# Patient Record
Sex: Female | Born: 1970 | Race: White | Hispanic: No | Marital: Single | Smoking: Never smoker
Health system: Southern US, Community
[De-identification: ages and names within clinical notes are randomized; demographics above are authoritative.]

## PROBLEM LIST (undated history)

## (undated) ENCOUNTER — Emergency Department (HOSPITAL_COMMUNITY): Admission: EM | Payer: Medicare Other | Source: Home / Self Care

## (undated) ENCOUNTER — Emergency Department (HOSPITAL_COMMUNITY): Disposition: A | Discharge status: Other Acute Inpatient Hospital | Payer: Medicare Other

## (undated) DIAGNOSIS — F4325 Adjustment disorder with mixed disturbance of emotions and conduct: Secondary | ICD-10-CM

## (undated) DIAGNOSIS — H409 Unspecified glaucoma: Secondary | ICD-10-CM

## (undated) DIAGNOSIS — I890 Lymphedema, not elsewhere classified: Secondary | ICD-10-CM

## (undated) DIAGNOSIS — R51 Headache: Secondary | ICD-10-CM

## (undated) DIAGNOSIS — I8390 Asymptomatic varicose veins of unspecified lower extremity: Secondary | ICD-10-CM

## (undated) DIAGNOSIS — G919 Hydrocephalus, unspecified: Secondary | ICD-10-CM

## (undated) DIAGNOSIS — R519 Headache, unspecified: Secondary | ICD-10-CM

## (undated) HISTORY — PX: EYE SURGERY: SHX253

## (undated) HISTORY — DX: Headache: R51

## (undated) HISTORY — DX: Lymphedema, not elsewhere classified: I89.0

## (undated) HISTORY — DX: Hydrocephalus, unspecified: G91.9

## (undated) HISTORY — DX: Unspecified glaucoma: H40.9

## (undated) HISTORY — DX: Headache, unspecified: R51.9

## (undated) HISTORY — DX: Adjustment disorder with mixed disturbance of emotions and conduct: F43.25

## (undated) HISTORY — DX: Asymptomatic varicose veins of unspecified lower extremity: I83.90

---

## 1998-05-13 ENCOUNTER — Emergency Department (HOSPITAL_COMMUNITY): Admission: EM | Admit: 1998-05-13 | Discharge: 1998-05-13 | Payer: Self-pay | Admitting: Emergency Medicine

## 1998-05-17 ENCOUNTER — Encounter: Admission: RE | Admit: 1998-05-17 | Discharge: 1998-05-17 | Payer: Self-pay | Admitting: *Deleted

## 1998-06-03 ENCOUNTER — Emergency Department (HOSPITAL_COMMUNITY): Admission: EM | Admit: 1998-06-03 | Discharge: 1998-06-03 | Payer: Self-pay | Admitting: Emergency Medicine

## 1998-10-19 HISTORY — PX: KNEE ARTHROSCOPY: SUR90

## 1998-10-31 ENCOUNTER — Ambulatory Visit (HOSPITAL_BASED_OUTPATIENT_CLINIC_OR_DEPARTMENT_OTHER): Admission: RE | Admit: 1998-10-31 | Discharge: 1998-10-31 | Payer: Self-pay | Admitting: Orthopedic Surgery

## 1999-02-28 ENCOUNTER — Ambulatory Visit (HOSPITAL_COMMUNITY): Admission: RE | Admit: 1999-02-28 | Discharge: 1999-02-28 | Payer: Self-pay | Admitting: Orthopedic Surgery

## 1999-09-04 ENCOUNTER — Encounter (INDEPENDENT_AMBULATORY_CARE_PROVIDER_SITE_OTHER): Payer: Self-pay | Admitting: Internal Medicine

## 2004-04-14 ENCOUNTER — Emergency Department (HOSPITAL_COMMUNITY): Admission: EM | Admit: 2004-04-14 | Discharge: 2004-04-14 | Payer: Self-pay | Admitting: Family Medicine

## 2004-06-17 ENCOUNTER — Encounter: Admission: RE | Admit: 2004-06-17 | Discharge: 2004-06-17 | Payer: Self-pay | Admitting: Internal Medicine

## 2004-06-30 ENCOUNTER — Encounter: Admission: RE | Admit: 2004-06-30 | Discharge: 2004-07-18 | Payer: Self-pay | Admitting: Internal Medicine

## 2004-08-13 ENCOUNTER — Ambulatory Visit: Payer: Self-pay | Admitting: Internal Medicine

## 2004-08-29 ENCOUNTER — Ambulatory Visit (HOSPITAL_COMMUNITY): Admission: RE | Admit: 2004-08-29 | Discharge: 2004-08-29 | Payer: Self-pay | Admitting: Internal Medicine

## 2005-08-05 ENCOUNTER — Ambulatory Visit: Payer: Self-pay | Admitting: Internal Medicine

## 2005-08-10 ENCOUNTER — Encounter: Admission: RE | Admit: 2005-08-10 | Discharge: 2005-09-30 | Payer: Self-pay | Admitting: Internal Medicine

## 2006-06-14 ENCOUNTER — Ambulatory Visit: Payer: Self-pay | Admitting: Hospitalist

## 2006-06-23 ENCOUNTER — Encounter: Admission: RE | Admit: 2006-06-23 | Discharge: 2006-07-23 | Payer: Self-pay | Admitting: Pulmonary Disease

## 2006-08-06 ENCOUNTER — Encounter (INDEPENDENT_AMBULATORY_CARE_PROVIDER_SITE_OTHER): Payer: Self-pay | Admitting: Internal Medicine

## 2006-08-06 DIAGNOSIS — I890 Lymphedema, not elsewhere classified: Secondary | ICD-10-CM | POA: Insufficient documentation

## 2007-02-23 ENCOUNTER — Ambulatory Visit: Payer: Self-pay | Admitting: Vascular Surgery

## 2007-02-23 ENCOUNTER — Ambulatory Visit (HOSPITAL_COMMUNITY): Admission: RE | Admit: 2007-02-23 | Discharge: 2007-02-23 | Payer: Self-pay | Admitting: Internal Medicine

## 2007-02-23 ENCOUNTER — Ambulatory Visit: Payer: Self-pay | Admitting: Internal Medicine

## 2007-02-23 ENCOUNTER — Encounter: Payer: Self-pay | Admitting: Vascular Surgery

## 2007-02-25 ENCOUNTER — Telehealth: Payer: Self-pay | Admitting: *Deleted

## 2007-04-13 ENCOUNTER — Encounter: Admission: RE | Admit: 2007-04-13 | Discharge: 2007-06-14 | Payer: Self-pay | Admitting: Pulmonary Disease

## 2007-05-03 ENCOUNTER — Encounter (INDEPENDENT_AMBULATORY_CARE_PROVIDER_SITE_OTHER): Payer: Self-pay | Admitting: Internal Medicine

## 2007-06-15 ENCOUNTER — Encounter: Admission: RE | Admit: 2007-06-15 | Discharge: 2007-06-30 | Payer: Self-pay | Admitting: Pulmonary Disease

## 2007-06-28 ENCOUNTER — Encounter (INDEPENDENT_AMBULATORY_CARE_PROVIDER_SITE_OTHER): Payer: Self-pay | Admitting: Internal Medicine

## 2007-06-29 ENCOUNTER — Encounter: Admission: RE | Admit: 2007-06-29 | Discharge: 2007-09-26 | Payer: Self-pay | Admitting: Pulmonary Disease

## 2007-07-29 ENCOUNTER — Encounter (INDEPENDENT_AMBULATORY_CARE_PROVIDER_SITE_OTHER): Payer: Self-pay | Admitting: Internal Medicine

## 2008-05-07 ENCOUNTER — Ambulatory Visit: Payer: Self-pay | Admitting: Infectious Diseases

## 2008-05-17 ENCOUNTER — Ambulatory Visit: Payer: Self-pay | Admitting: Internal Medicine

## 2009-11-18 ENCOUNTER — Telehealth (INDEPENDENT_AMBULATORY_CARE_PROVIDER_SITE_OTHER): Payer: Self-pay | Admitting: Internal Medicine

## 2009-11-20 ENCOUNTER — Ambulatory Visit: Payer: Self-pay | Admitting: Internal Medicine

## 2010-01-13 ENCOUNTER — Telehealth (INDEPENDENT_AMBULATORY_CARE_PROVIDER_SITE_OTHER): Payer: Self-pay | Admitting: Internal Medicine

## 2010-01-27 ENCOUNTER — Encounter (INDEPENDENT_AMBULATORY_CARE_PROVIDER_SITE_OTHER): Payer: Self-pay | Admitting: Internal Medicine

## 2010-10-06 ENCOUNTER — Emergency Department (HOSPITAL_COMMUNITY)
Admission: EM | Admit: 2010-10-06 | Discharge: 2010-10-06 | Payer: Self-pay | Source: Home / Self Care | Admitting: Emergency Medicine

## 2010-10-06 ENCOUNTER — Telehealth: Payer: Self-pay | Admitting: *Deleted

## 2010-10-14 ENCOUNTER — Ambulatory Visit: Payer: Self-pay | Admitting: Internal Medicine

## 2010-10-14 LAB — CONVERTED CEMR LAB
HDL: 56 mg/dL (ref >39)
Total CHOL/HDL Ratio: 2.5
VLDL: 19 mg/dL (ref 0–40)

## 2010-11-18 NOTE — Progress Notes (Signed)
Summary: phone/gg  Phone Note From Other Clinic   Summary of Call: Call from [facility] medical supply asking for a Rx for compression hose for pt.  The pt has removed her bandages and is ready for the hose but  Ann at [facility] needs to know the size of compression needed.  The last Rx  written  11/30/08 was for 30 - 40 mm and a Rx is needed.  If the pt needs 20 - 30 mm compression a written Rx is not needed.   Please call [facility] @ [phone] and advise. Initial call taken by: Merrie Roof RN,  January 13, 2010 12:13 PM  Follow-up for Phone Call        I have never seen Ms. Dick and have no idea about the size and other details of the hose. Dr. Baltazar Apo recently saw the pt and prescribed the hose. She may be able to help you.  Follow-up by: Jason Coop MD,  January 13, 2010 12:31 PM     Appended Document: phone/gg Rx written and faxed to [facility]

## 2010-11-18 NOTE — Assessment & Plan Note (Signed)
Summary: compression sock [mkj]   Vital Signs:  Patient profile:   40 year old female Height:      69 inches (175.26 cm) Weight:      163.6 pounds (74.36 kg) BMI:     24.25 Temp:     97.6 degrees F (36.44 degrees C) oral Pulse rate:   101 / minute BP sitting:   115 / 69  (right arm)  Vitals Entered By: Chinita Pester RN (November 20, 2009 9:37 AM) CC: Lymphedema in both legs esp. left leg; needs Rx for compression hose. Is Patient Diabetic? No Pain Assessment Patient in pain? no      Nutritional Status BMI of 19 -24 = normal  Have you ever been in a relationship where you felt threatened, hurt or afraid?Unable to ask; mother w/pt.   Does patient need assistance? Functional Status Self care Ambulation Normal   CC:  Lymphedema in both legs esp. left leg; needs Rx for compression hose.Marland Kitchen  History of Present Illness: Pt is a 40y/o with PMHX of left lower extremity lymphedema who presents to the clinic today with left lower extremity pain.  The pt was seen on 7/20 and treated for cellulitis of her left leg lower extremity.  The patient completed her antibiotic course and saw an overall improvement in the appearance of her leg.  However, there remains a focal area of tenderness on the anterior legs, roughly 10 cm below the knee.  The area is roughly unchanged in appearance from her prior clinic visit.   She denies any fever, chills, or overall signs of systemic infection.    Pt would like a prescription for compression stockings as well as bandages as she notes banding has helped in the past.    Preventive Screening-Counseling & Management  Alcohol-Tobacco     Alcohol drinks/day: 0     Smoking Status: never  Caffeine-Diet-Exercise     Does Patient Exercise: no  Current Medications (verified): 1)  None  Allergies (verified): No Known Drug Allergies  Past History:  Past Medical History: Last updated: 08/06/2006 Chronic idopathic lymphedema LLE  Family History: Last  updated: 05/07/2008 no significant family history of medical problems  Social History: Last updated: 05/07/2008 Lives at home with mom, single, employed Never Smoked Alcohol use-no Drug use-no Regular exercise-yes  Risk Factors: Alcohol Use: 0 (11/20/2009) Exercise: no (11/20/2009)  Risk Factors: Smoking Status: never (11/20/2009)  Social History: Does Patient Exercise:  no  Review of Systems General:  Denies chills, fatigue, and fever. CV:  Complains of swelling of feet; denies chest pain or discomfort and difficulty breathing while lying down. Resp:  Denies chest pain with inspiration and cough. GI:  Denies abdominal pain and change in bowel habits. Derm:  Complains of changes in color of skin.  Physical Exam  General:  alert and well-developed.   Head:  normocephalic and atraumatic.   Eyes:  vision grossly intact, pupils equal, pupils round, and pupils reactive to light.   Neck:  supple, full ROM, and no masses.   Lungs:  normal respiratory effort, no intercostal retractions, no accessory muscle use, and normal breath sounds.   Heart:  normal rate, regular rhythm, no murmur, no gallop, and no rub.   Abdomen:  soft, non-tender, and normal bowel sounds.   Extremities:  lower extremity lymph edema greater in left lower extremity, tender on palpation erythematous greater over right extremity warmth to the touch bilaterally but greater over right lower extremity    Impression & Recommendations:  Problem # 1:  CELLULITIS, FOOT, LEFT (ICD-682.7) Assessment Comment Only On physical exam there is marked swelling and erythema in lower extremities bilaterally, but greater over left lower extremity. Erythema is spread over the anterior aspect of the LLE and it is painful and warm to touch. She denies any numbness and tingling sensation in LE and no muscle weakness. Her gait is stable. She also reports no fever, chills, no chest pain, shortness of breath, no recent trauma to the  foot. This swelling is likely caused by cellulitis and we have started the patient on Doxycycline 100 mg tablets twice daily for 10 days.   Her updated medication list for this problem includes:    Doxycycline Hyclate 100 Mg Solr (Doxycycline hyclate) .Marland Kitchen... Take 1 tablet by mouth two times a day  Problem # 2:  LYMPHEDEMA (ICD-457.1) Assessment: Comment Only Chronic idiopathic lymphedema. Has generally been well controlled with compression stockings and banding. I will provide a prescription for compression bandages that pt can obtain at the medical supply store. I have advise pt to keep her legs elevated to decrease the edema. Have also discussed with patient that if she develops new onset fevers, shortness of breath, increased heart rate, severity in lower extremity pain, to please call clinic or go to the ED.  Compression Bandage Prescription Elastomull 3" wide (#1 = quantity) 8 cm Comprilan (#1) 10 cm Comprilan (#1) 12 cm Comprilan (#1) Artiflex (#1) -all obtained from [facility]  Complete Medication List: 1)  Doxycycline Hyclate 100 Mg Solr (Doxycycline hyclate) .... Take 1 tablet by mouth two times a day  Other Orders: Admin 1st Vaccine (16109) Flu Vaccine 34yrs + (60454)  Patient Instructions: 1)  Please call clinic if bandages or compression stockings are not of the right fit. 2)  Please take doxycycline twice daily for 10 days.  Prescriptions: DOXYCYCLINE HYCLATE 100 MG SOLR (DOXYCYCLINE HYCLATE) Take 1 tablet by mouth two times a day  #20 x 0   Entered and Authorized by:   Melida Quitter MD   Signed by:   Melida Quitter MD on 11/20/2009   Method used:   Print then Give to Patient   RxID:   [id number]   Prevention & Chronic Care Immunizations   Influenza vaccine: Fluvax 3+  (11/20/2009)    Tetanus booster: Not documented    Pneumococcal vaccine: Not documented  Other Screening   Pap smear: Refused  (08/05/2005)   Smoking status: never   (11/20/2009)  Lipids   Total Cholesterol: Not documented   LDL: Not documented   LDL Direct: Not documented   HDL: Not documented   Triglycerides: Not documented   Nursing Instructions: Give Flu vaccine today   Flu Vaccine Consent Questions     Do you have a history of severe allergic reactions to this vaccine? no    Any prior history of allergic reactions to egg and/or gelatin? no    Do you have a sensitivity to the preservative Thimersol? no    Do you have a past history of Guillan-Barre Syndrome? no    Do you currently have an acute febrile illness? no    Have you ever had a severe reaction to latex? no    Vaccine information given and explained to patient? yes    Are you currently pregnant? no    Lot [id number]   Exp Date:01/16/2010   Manufacturer: [employer]    Site Given  Right Deltoid IM      Sharene Skeans  November 20, 2009 10:53 AM         .Roque Lias

## 2010-11-18 NOTE — Medication Information (Signed)
Summary: COMPRESSION (TED HOSE)STOCKINGS  COMPRESSION (TED HOSE)STOCKINGS   Imported By: Margie Billet 01/28/2010 15:41:10  _____________________________________________________________________  External Attachment:    Type:   Image     Comment:   External Document

## 2010-11-18 NOTE — Progress Notes (Signed)
Summary: Compression Hose  Phone Note Call from Patient   Caller: Mom Summary of Call: Pt here needs a prescription for Compression Hose faxed to [facility] at [phone]. Initial call taken by: Angelina Ok RN,  November 18, 2009 1:49 PM  Follow-up for Phone Call       Follow-up by: Jason Coop MD,  November 21, 2009 8:38 AM    New/Updated Medications: B-4 MED COMPRESSION HOSE MENS  MISC (ELASTIC BANDAGES & SUPPORTS) apply as indicated. Prescriptions: B-4 MED COMPRESSION HOSE MENS  MISC (ELASTIC BANDAGES & SUPPORTS) apply as indicated.  #1 x 0   Entered and Authorized by:   Jason Coop MD   Signed by:   Jason Coop MD on 11/21/2009   Method used:   Faxed to ...       [facility] (retail)             [place], [place]         Ph:        Fax: [phone]   RxID:   [id number]

## 2010-11-20 NOTE — Assessment & Plan Note (Signed)
Summary: ACUTE-F/U WITH CELLULITIS/CFB(PRIBULA)/CFB   Vital Signs:  Patient profile:   40 year old female Height:      69 inches Weight:      171.0 pounds BMI:     25.34 Temp:     97.3 degrees F oral Pulse rate:   71 / minute BP sitting:   115 / 70  (right arm)  Vitals Entered By: Filomena Jungling NT II (October 14, 2010 11:12 AM) CC: FOLLOWUP ER VISIT Is Patient Diabetic? No Pain Assessment Patient in pain? yes     Location: LEG Intensity: 6 Type: aching Onset of pain  1 WEEK AGO Nutritional Status BMI of 25 - 29 = overweight  Have you ever been in a relationship where you felt threatened, hurt or afraid?No   Does patient need assistance? Functional Status Self care Ambulation Normal   CC:  FOLLOWUP ER VISIT.  History of Present Illness: follow up from Ed on 10/06/2010 for a Right leg cellulitis. Please, see ED note. Patient complted 10/13/10 a  7days course of doxycycline. Reports imoprovement. Denies fever, chills, right leg/calf swelling. continues to have a slight pain at the site of lesion ->most likely post traumatic (struck her leg against a car door). No any other concerns.  Preventive Screening-Counseling & Management  Alcohol-Tobacco     Alcohol drinks/day: 0     Smoking Status: never  Caffeine-Diet-Exercise     Does Patient Exercise: no  Problems Prior to Update: 1)  Cellulitis, Foot, Left  (ICD-682.7) 2)  Arthroscopy, Knee, Hx of  (ICD-V45.89) 3)  Lymphedema  (ICD-457.1)  Medications Prior to Update: 1)  Doxycycline Hyclate 100 Mg Solr (Doxycycline Hyclate) .... Take 1 Tablet By Mouth Two Times A Day 2)  B-4 Med Compression Hose Mens  Misc (Elastic Bandages & Supports) .... Apply As Indicated.  Current Medications (verified): 1)  B-4 Med Compression Hose Mens  Misc (Elastic Bandages & Supports) .... Apply As Indicated.  Allergies (verified): No Known Drug Allergies  Past History:  Past medical, surgical, family and social histories (including  risk factors) reviewed, and no changes noted (except as noted below).  Past Medical History: Reviewed history from 08/06/2006 and no changes required. Chronic idopathic lymphedema LLE  Family History: Reviewed history from 05/07/2008 and no changes required. no significant family history of medical problems  Social History: Reviewed history from 05/07/2008 and no changes required. Lives at home with mom, single, employed Never Smoked Alcohol use-no Drug use-no Regular exercise-yes  Physical Exam  General:  alert and well-developed.   Head:  normocephalic and atraumatic.   Eyes:  vision grossly intact, pupils equal, pupils round, and pupils reactive to light.  Left eye exotropia noted . Mouth:  pharynx pink and moist and fair dentition.  pharynx pink and moist and fair dentition.   Neck:  supple, full ROM, and no masses.   Lungs:  normal respiratory effort, no intercostal retractions, no accessory muscle use, and normal breath sounds.   Heart:  normal rate, regular rhythm, no murmur, no gallop, and no rub.   Abdomen:  soft, non-tender, and normal bowel sounds.   Pulses:  R and L popliteal, dorsalis pedis and posterior tibial pulses are full and equal bilaterally Extremities:  lower extremity lymph edema greater in left lower extremity,mildly  tender on palpation in a compression stocking;  Right LE with pitting edema 1+/4; no calf TTP   Neurologic:  alert & oriented X3, cranial nerves II-XII intact, strength normal in all extremities, and gait normal.  Skin:  Right mid-shin area with an old and healing abrasion; a mildly erythmatous and indurated 5 cm in diameter surrounding lesion; mildly TTP. Cervical Nodes:  No lymphadenopathy noted Psych:  Cognition and judgment appear intact. Alert and cooperative with normal attention span and concentration. No apparent delusions, illusions, hallucinations   Impression & Recommendations:  Problem # 1:  CELLULITIS, LEG, RIGHT  (ICD-682.6) Assessment Improved  Per patient and her mother, noticed improvement in right leg cellulitis after 7 days course of doxycylcine. Will extend therapy for additional 7 days. Patient is instructed to call with any questions or concerns. She is also due for a Tdap which will be given today. The following medications were removed from the medication list:    Doxycycline Hyclate 100 Mg Solr (Doxycycline hyclate) .Marland Kitchen... Take 1 tablet by mouth two times a day Her updated medication list for this problem includes:    Doxycycline Hyclate 100 Mg Caps (Doxycycline hyclate) .Marland Kitchen... Take one tablet by mouth for 7 days with meals for right leg infection  Elevate affected area. Warm moist compresses for 20 minutes every 2 hours while awake. Take antibiotics as directed and take acetaminophen as needed. To be seen in 48-72 hours if no improvement, sooner if worse.  Problem # 2:  LYMPHEDEMA (ICD-457.1) Assessment: Unchanged Patient has had a complete work up. No change in managment. Instructed to elevate LE's above heart level while supine or being seated; compression stockings.  Complete Medication List: 1)  B-4 Med Compression Hose Mens Misc (Elastic bandages & supports) .... Apply as indicated. 2)  Doxycycline Hyclate 100 Mg Caps (Doxycycline hyclate) .... Take one tablet by mouth for 7 days with meals for right leg infection  Other Orders: Influenza Vaccine MCR (14782) Tdap => 109yrs IM (95621) Admin 1st Vaccine (30865) T-Lipid Profile (78469-62952)  Patient Instructions: 1)  Please, take doxycyline for 7 more days. 2)  Call with any questions and follow up on as needed basis. Prescriptions: DOXYCYCLINE HYCLATE 100 MG CAPS (DOXYCYCLINE HYCLATE) Take one tablet by mouth for 7 days with meals for right leg infection  #14 x 0   Entered and Authorized by:   Deatra Robinson MD   Signed by:   Deatra Robinson MD on 10/14/2010   Method used:   Electronically to        [facility] [phone][facility]  (retail)       [address]       [place], [place]  [id number]       Ph: [phone] or [phone]       Fax: [phone]   RxID:   [id number]    Orders Added: 1)  Est. Patient Level III [35573] 2)  Influenza Vaccine MCR [00025] 3)  Tdap => 20yrs IM [90715] 4)  Admin 1st Vaccine [90471] 5)  T-Lipid Profile [22025-42706]   Immunizations Administered:  Influenza Vaccine # 1:    Vaccine Type: Fluvax MCR    Site: right deltoid    Mfr: GlaxoSmithKline    Dose: 0.5 ml    Route: IM    Given by: Angelina Ok RN    Exp. Date: 04/18/2011    Lot #: [id number]    VIS given: 05/13/10 version given October 14, 2010.  Tetanus Vaccine:    Vaccine Type: Tdap    Site: right deltoid    Mfr: GlaxoSmithKline    Dose: 0.5 ml    Route: IM    Given by: Venita Sheffield  Herbin RN    Exp. Date: 08/07/2012    Lot #: [id number]    VIS given: 09/05/08 version given October 14, 2010.  Flu Vaccine Consent Questions:    Do you have a history of severe allergic reactions to this vaccine? no    Any prior history of allergic reactions to egg and/or gelatin? no    Do you have a sensitivity to the preservative Thimersol? no    Do you have a past history of Guillan-Barre Syndrome? no    Do you currently have an acute febrile illness? no    Have you ever had a severe reaction to latex? no    Vaccine information given and explained to patient? yes    Are you currently pregnant? no   Immunizations Administered:  Influenza Vaccine # 1:    Vaccine Type: Fluvax MCR    Site: right deltoid    Mfr: GlaxoSmithKline    Dose: 0.5 ml    Route: IM    Given by: Angelina Ok RN    Exp. Date: 04/18/2011    Lot #: [id number]    VIS given: 05/13/10 version given October 14, 2010.  Tetanus Vaccine:    Vaccine Type: Tdap    Site: right deltoid    Mfr: GlaxoSmithKline    Dose: 0.5 ml    Route: IM    Given by: Angelina Ok RN    Exp. Date: 08/07/2012    Lot #: [id number]     VIS given: 09/05/08 version given October 14, 2010. Process Orders Check Orders Results:     [facility]: ABN not required for this insurance Tests Sent for requisitioning (October 16, 2010 9:32 AM):     10/14/2010: [facility] -- T-Lipid Profile [phone] (signed)     Process Orders Check Orders Results:     [facility]: ABN not required for this insurance Tests Sent for requisitioning (October 16, 2010 9:32 AM):     10/14/2010: [facility] -- T-Lipid Profile [phone] (signed)     Prevention & Chronic Care Immunizations   Influenza vaccine: Fluvax MCR  (10/14/2010)   Influenza vaccine due: 06/20/2011    Tetanus booster: 10/14/2010: Tdap   Tetanus booster due: 10/14/2020    Pneumococcal vaccine: Not documented  Other Screening   Pap smear: Refused  (08/05/2005)   Pap smear action/deferral: Refused  (10/14/2010)   Smoking status: never  (10/14/2010)  Lipids   Total Cholesterol: Not documented   Lipid panel action/deferral: Lipid Panel ordered   LDL: Not documented   LDL Direct: Not documented   HDL: Not documented   Triglycerides: Not documented   Nursing Instructions: Give Flu vaccine today Give tetanus booster today

## 2010-11-20 NOTE — Progress Notes (Signed)
Summary: Lymphodema  Phone Note Call from Patient   Caller: Patient Call For: Leodis Sias MD Summary of Call: Call from pt said that she had hit her leg on something.  Pt has Lymphodema.  Site is draining.  Pt wass advised to go to Urgent Care of ER today. Angelina Ok RN  October 06, 2010 11:02 AM   Initial call taken by: Angelina Ok RN,  October 07, 2010 3:42 PM

## 2010-12-17 ENCOUNTER — Telehealth: Payer: Self-pay | Admitting: *Deleted

## 2010-12-17 NOTE — Telephone Encounter (Signed)
Pt called with c/o lymphedema in both legs.  States she is getting blisters on legs.  Hose not helping. Onset 1 week  Ago. Hx: surgery in 2000.  Will see Friday

## 2010-12-19 ENCOUNTER — Encounter: Payer: Self-pay | Admitting: Internal Medicine

## 2010-12-19 ENCOUNTER — Ambulatory Visit (INDEPENDENT_AMBULATORY_CARE_PROVIDER_SITE_OTHER): Payer: PRIVATE HEALTH INSURANCE | Admitting: Internal Medicine

## 2010-12-19 VITALS — BP 103/63 | HR 62 | Temp 97.5°F | Ht 68.0 in | Wt 172.0 lb

## 2010-12-19 DIAGNOSIS — Z9889 Other specified postprocedural states: Secondary | ICD-10-CM

## 2010-12-19 DIAGNOSIS — I890 Lymphedema, not elsewhere classified: Secondary | ICD-10-CM

## 2010-12-19 MED ORDER — JOBST 20-30MMHG COMPRESSION SM MISC: Status: DC

## 2010-12-19 NOTE — Progress Notes (Signed)
  Subjective:    Patient ID: Alison Fox, female    DOB: [birth year], 40 y.o.   MRN: [id number]  HPI Patient is a 40yo female with PMHx of chronic left lower extremity lymphedema sustained after knee surgery in 2000, who presents to clinic with worsening leg swelling, over an unspecified length of time. Patient's mother is present during the visit today and states that Alison Fox has not been compliant with instructions provided during prior therapy sessions - including that patient not wearing compression stockings throughout day, is questionably wearing banding at night, not performing massaging techniques, and is standing for several hours throughout the day. Patient also describes focal tenderness over left anterior shin, where she previously was hit by a car door. Pain described as achy, 5/10 for which she is taking OTC Tylenol and Motrin, with adequate relief. Taking 4-5 tylenol daily for pain. Otherwise without numbness, tingling, color changes of legs. No focal erythema (beyond baseline), no increase in warmth, or weeping of skin.  Review of Systems Per HPI    Objective:   Physical Exam General: Vital signs reviewed and noted. Well-developed,well-nourished,in no acute distress; alert,appropriate and cooperative throughout examination. Head: normocephalic, atraumatic. Neck: No deformities, masses, or tenderness noted. Lungs: Normal respiratory effort. Clear to auscultation BL without crackles or wheezes.  Heart: RRR. S1 and S2 normal without gallop, murmur, or rubs.  Abdomen: BS normoactive. Soft, Nondistended, non-tender.  No masses or organomegaly. Extremities: Lt lower extremity with nonpitting edema, measuring approximately 19.5 cm in diameter across calf.  Indentation over anterior shin (from previous car door injury), with mild erythema (but patient and mom confirm it is unchanged from baseline), no warmth or discharge from surrounding skin.        Assessment & Plan:

## 2010-12-19 NOTE — Patient Instructions (Addendum)
Please follow-up at the clinic in 1 month at which time we will reevaluate your leg swelling. Use Tylenol for pain, do not use more than 4 grams per day. Wear your compression stalkings regularly and band your legs at night as previously instructed. Also you can use the massage techniques that you've previously been recommended.  If you have worsening of your leg pain, redness, fevers, chills, call the clinic at [phone] and come in earlier. Please bring all of your medications in a bag to your next visit.

## 2010-12-20 ENCOUNTER — Other Ambulatory Visit: Payer: Self-pay | Admitting: Internal Medicine

## 2010-12-20 ENCOUNTER — Encounter: Payer: Self-pay | Admitting: Internal Medicine

## 2010-12-20 NOTE — Assessment & Plan Note (Addendum)
Patient has not been compliant with recommendations including wearing stockings throughout day, keeping legs raised above heart as much as possible, wearing banding throughout the night, performing massaging techniques recommended in therapy. No evidence of acute cellulitis (erythema is at baseline, no increase in warmth) - Rx for new stockings provided - Recommend wearing stockings regularly, keeping legs elevated above heart as much as possible, wearing banding, massage techniques - Tylenol for pain relief, as it is adequately managing pain - Rx given for new ACE bandaging. - Patient instructed to return to clinic if symptoms worsen, if she develops fevers/chills, erythema worsens.

## 2011-01-30 ENCOUNTER — Ambulatory Visit: Payer: PRIVATE HEALTH INSURANCE | Admitting: Internal Medicine

## 2011-02-25 ENCOUNTER — Encounter: Payer: Self-pay | Admitting: Internal Medicine

## 2011-02-25 ENCOUNTER — Ambulatory Visit (INDEPENDENT_AMBULATORY_CARE_PROVIDER_SITE_OTHER): Payer: Medicare Other | Admitting: Internal Medicine

## 2011-02-25 VITALS — BP 104/68 | HR 61 | Temp 97.0°F | Ht 67.5 in | Wt 169.2 lb

## 2011-02-25 DIAGNOSIS — I890 Lymphedema, not elsewhere classified: Secondary | ICD-10-CM

## 2011-02-25 NOTE — Patient Instructions (Addendum)
Keep using the compression stocking every day.  Only take it off a night.  Please keep your legs elevated as much as possible and take frequent breaks if you are on your feet during the day.  Keep the appointment with the Lymphedema clinic.  Please have them send me a copy of their notes for our records.  You can use Aleve 220 mg 1 tablet twice daily for the pain.  It is okay to use Tylenol as directed in between for breakthrough pain.  Please follow up in 1-3 month to check on the process of your edema and the pain.

## 2011-02-25 NOTE — Progress Notes (Signed)
  Subjective:    Patient ID: Alison Fox, female    DOB: 1970/12/06, 40 y.o.   MRN: [id number]  HPI  Alison Fox is a 40 year old woman who presents to clinic today for follow up of her chronic lymphedema in her left leg.  She had a knee arthroscopy in 2000 and has been dealing with lymphedema since then.  She has had several episodes of cellulitis in the past and states that recently the swelling seems to be getting worse.  She has some pain over the leg that is managed with tylenol and ibuprofen.  She states she has been compliant with her leg elevation and compression stockings.  She denies any fevers, chills, nausea, vomiting, redness,  warmth over the leg, or open wounds.  Review of Systems    Constitutional: Denies fever, chills, diaphoresis, appetite change and fatigue.  HEENT: Denies photophobia, eye pain, redness, hearing loss, ear pain, congestion, sore throat, rhinorrhea, sneezing, mouth sores, trouble swallowing, neck pain, neck stiffness and tinnitus.   Respiratory: Denies SOB, DOE, cough, chest tightness,  and wheezing.   Cardiovascular: Denies chest pain, palpitations and leg swelling.  Gastrointestinal: Denies nausea, vomiting, abdominal pain, diarrhea, constipation, blood in stool and abdominal distention.  Genitourinary: Denies dysuria, urgency, frequency, hematuria, flank pain and difficulty urinating.  Musculoskeletal: Denies myalgias, back pain, joint swelling, arthralgias and gait problem.  Skin: Denies pallor, rash and wound.  Neurological: Denies dizziness, seizures, syncope, weakness, light-headedness, numbness and headaches.  Hematological: Denies adenopathy. Easy bruising, personal or family bleeding history  Psychiatric/Behavioral: Denies suicidal ideation, mood changes, confusion, nervousness, sleep disturbance and agitation  Objective:   Physical Exam    Constitutional: Vital signs reviewed.  Patient is a well-developed and well-nourished woman in no acute  distress and cooperative with exam. Alert and oriented x3.  Head: Normocephalic and atraumatic Ear: TM normal bilaterally Mouth: no erythema or exudates, MMM Eyes: PERRL, EOMI, conjunctivae normal, No scleral icterus.  Cardiovascular: RRR, S1 normal, S2 normal, no MRG, pulses symmetric and intact bilaterally Pulmonary/Chest: CTAB, no wheezes, rales, or rhonchi Abdominal: Soft. Non-tender, non-distended, bowel sounds are normal, no masses, organomegaly, or guarding present.  Musculoskeletal: No joint deformities, erythema, or stiffness, ROM full and no nontender Hematology: no cervical, inginal, or axillary adenopathy.  Neurological: A&O x3, Strentht is normal and symmetric bilaterally, cranial nerve II-XII are grossly intact, no focal motor deficit, sensory intact to light touch bilaterally.  Extremities: skin is warm, dry and intact. No rash, cyanosis, or clubbing. There is marked edema in the left leg to the hip.  No open wounds noted.  There is some mild pain to palpation.  Right leg is with in normal limits.   Assessment & Plan:

## 2011-02-26 NOTE — Assessment & Plan Note (Signed)
Alison Fox states that she has been using her compression stockings but has not been doing the massage or using the ACE bandage at night.  She has been extensively worked up in the past and has no history of VTE or other blockage.  She has no overt signs of cellulitis at this time.  Her compression stockings were last replaced in March and appear to be in good condition.  We will try getting her into a physical therapy lymphedema clinic for assistance getting her swelling improved.  She was instructed that when she sleeps to have the leg elevated and when she wakes up, even before she sits up in bed, to put the compression stocking on.  She should continue with leg elevation and compression stockings until she is able to follow up with the lymphedema clinic.

## 2011-03-03 ENCOUNTER — Telehealth: Payer: Self-pay | Admitting: *Deleted

## 2011-03-03 NOTE — Telephone Encounter (Signed)
Call from rehab center in [facility]  Pt was referred for treatment of lymphedema. Needs order for treatment, cause of lymphedema  and demographics. Fax to [phone]

## 2011-03-03 NOTE — Telephone Encounter (Signed)
Done

## 2011-03-30 ENCOUNTER — Encounter: Payer: Self-pay | Admitting: Internal Medicine

## 2011-03-30 ENCOUNTER — Ambulatory Visit (INDEPENDENT_AMBULATORY_CARE_PROVIDER_SITE_OTHER): Payer: Medicare Other | Admitting: Internal Medicine

## 2011-03-30 VITALS — BP 92/57 | HR 60 | Temp 97.1°F | Ht 69.0 in | Wt 159.8 lb

## 2011-03-30 DIAGNOSIS — I890 Lymphedema, not elsewhere classified: Secondary | ICD-10-CM

## 2011-03-30 NOTE — Assessment & Plan Note (Signed)
Slowly improving. We'll not change management at this time. Known him to start any diuretics.

## 2011-03-30 NOTE — Progress Notes (Signed)
  Subjective:    Patient ID: Alison Fox, female    DOB: 1971/04/19, 40 y.o.   MRN: [id number]  Leg Pain     Alison Fox is a 40 year old woman who presents to clinic today for follow up of her chronic lymphedema in her left leg.  She had a knee arthroscopy in 2000 and has been dealing with lymphedema since then.  She has had several episodes of cellulitis in the past and states that recently the swelling seems to be getting worse.  She has some pain over the leg that is managed with tylenol and ibuprofen.  She states she has been compliant with her leg elevation and compression stockings.  She denies any fevers, chills, nausea, vomiting, redness,  warmth over the leg, or open wounds.  Patient reports that her review prescription off compression stockings has not come through yet. She is working with a physical therapist and improving with her symptoms.  Review of Systems     Constitutional: Denies fever, chills, diaphoresis, appetite change and fatigue.  HEENT: Denies photophobia, eye pain, redness, hearing loss, ear pain, congestion, sore throat, rhinorrhea, sneezing, mouth sores, trouble swallowing, neck pain, neck stiffness and tinnitus.   Respiratory: Denies SOB, DOE, cough, chest tightness,  and wheezing.   Cardiovascular: Denies chest pain, palpitations and leg swelling.  Gastrointestinal: Denies nausea, vomiting, abdominal pain, diarrhea, constipation, blood in stool and abdominal distention.  Genitourinary: Denies dysuria, urgency, frequency, hematuria, flank pain and difficulty urinating.  Musculoskeletal: Denies myalgias, back pain, joint swelling, arthralgias and gait problem.  Skin: Denies pallor, rash and wound.  Neurological: Denies dizziness, seizures, syncope, weakness, light-headedness, numbness and headaches.  Hematological: Denies adenopathy. Easy bruising, personal or family bleeding history  Psychiatric/Behavioral: Denies suicidal ideation, mood changes, confusion,  nervousness, sleep disturbance and agitation  Objective:   Physical Exam     Constitutional: Vital signs reviewed.  Patient is a well-developed and well-nourished woman in no acute distress and cooperative with exam. Alert and oriented x3.  Head: Normocephalic and atraumatic Ear: TM normal bilaterally Mouth: no erythema or exudates, MMM Eyes: PERRL, EOMI, conjunctivae normal, No scleral icterus.  Cardiovascular: RRR, S1 normal, S2 normal, no MRG, pulses symmetric and intact bilaterally Pulmonary/Chest: CTAB, no wheezes, rales, or rhonchi Abdominal: Soft. Non-tender, non-distended, bowel sounds are normal, no masses, organomegaly, or guarding present.  Musculoskeletal: No joint deformities, erythema, or stiffness, ROM full and no nontender Hematology: no cervical, inginal, or axillary adenopathy.  Neurological: A&O x3, Strentht is normal and symmetric bilaterally, cranial nerve II-XII are grossly intact, no focal motor deficit, sensory intact to light touch bilaterally.  Extremities: skin is warm, dry and intact. No rash, cyanosis, or clubbing. There is marked edema in the left leg to the hip.  No open wounds noted.  There is some mild pain to palpation.  Right leg is with in normal limits.   Assessment & Plan:

## 2011-03-30 NOTE — Patient Instructions (Signed)
Follow up on 2 months. Continue wearing stockings. Use tyelonol twice a day for next month on scheduled bases, and use 1-2 additional tylenol as needed through the day. Get fitted with new stockings.

## 2011-05-15 ENCOUNTER — Ambulatory Visit (INDEPENDENT_AMBULATORY_CARE_PROVIDER_SITE_OTHER): Payer: Medicare Other | Admitting: Internal Medicine

## 2011-05-15 ENCOUNTER — Encounter: Payer: Self-pay | Admitting: Internal Medicine

## 2011-05-15 VITALS — BP 91/58 | HR 63 | Temp 97.0°F | Ht 69.0 in | Wt 159.9 lb

## 2011-05-15 DIAGNOSIS — Z0000 Encounter for general adult medical examination without abnormal findings: Secondary | ICD-10-CM

## 2011-05-15 DIAGNOSIS — I890 Lymphedema, not elsewhere classified: Secondary | ICD-10-CM

## 2011-05-15 NOTE — Patient Instructions (Signed)
Please remember to put on your compression stockings right away in the morning before you sit up or stand up.  This will keep the swelling down throughout the day.    Keep elevating your feet several times during the day and avoid long periods of standing.  Follow up with me in 6-12 months.

## 2011-05-15 NOTE — Progress Notes (Signed)
Subjective:   Patient ID: Alison Fox female   DOB: 1971/01/06 40 y.o.   MRN: [id number]  HPI: [name] is a 40 y.o. who presents today for follow up of her chronic lymphedema.  When I saw her last we decided to try physical therapy to try to reduce the fluid in her legs.  She has completed the therapy and feels that it helped some but not very much.  She still had significant swelling and continues to have numbness and tingling that starts in her feet and works its way up the front of her shin as the day goes on.  When asked she states that she has been wearing the compression stockings but doesn't usually put then on until after she gets up and starts moving for the day.  She states she has been elevating her feet but does still stand on her feet and walk for a significant portion of the day.  She denies any weakness in her leg, fever, chills, rashes, or open wounds on her legs.    We also discussed preventative health issues today including pap smears.  She states that she has never had an abnormal pap smear but it has been several years since her last one.  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  She denies any heavy menstrual bleeding, abnormal periods, abdominal pain, or discharge.    Past Medical History  Diagnosis Date  . Lymphedema     Chronic following left knee surgery in 2000.   Current Outpatient Prescriptions  Medication Sig Dispense Refill  . Elastic Bandages & Supports (JOBST KNEE HIGH COMPRESSION SM) MISC Wear supports throughout the day.  2 each  0   No family history on file. History   Social History  . Marital Status: Single    Spouse Name: N/A    Number of Children: N/A  . Years of Education: N/A   Social History Main Topics  . Smoking status: Never Smoker   . Smokeless tobacco: None  . Alcohol Use: No  . Drug Use: No  . Sexually Active: None   Other Topics Concern  . None   Social History Narrative  . None     Review of Systems: Constitutional: Denies fever, chills, diaphoresis, appetite change and fatigue.  HEENT: Denies photophobia, eye pain, redness, hearing loss, ear pain, congestion, sore throat, rhinorrhea, sneezing, mouth sores, trouble swallowing, neck pain, neck stiffness and tinnitus.   Respiratory: Denies SOB, DOE, cough, chest tightness,  and wheezing.   Cardiovascular: Denies chest pain, palpitations and leg swelling.  Gastrointestinal: Denies nausea, vomiting, abdominal pain, diarrhea, constipation, blood in stool and abdominal distention.  Genitourinary: Denies dysuria, urgency, frequency, hematuria, flank pain and difficulty urinating.  Musculoskeletal: Denies myalgias, back pain, joint swelling, arthralgias and gait problem.  Skin: Denies pallor, rash and wound.  Neurological: Denies dizziness, seizures, syncope, weakness, light-headedness, numbness and headaches.  Hematological: Denies adenopathy. Easy bruising, personal or family bleeding history  Psychiatric/Behavioral: Denies suicidal ideation, mood changes, confusion, nervousness, sleep disturbance and agitation  Objective:  Physical Exam: Filed Vitals:   05/15/11 1332  BP: 91/58  Pulse: 63  Temp: 97 F (36.1 C)  TempSrc: Oral  Height: 5\' 9"  (1.753 m)  Weight: 159 lb 14.4 oz (72.53 kg)   Constitutional: Vital signs reviewed.  Patient is a well-developed and well-nourished woman in no acute distress and cooperative with exam. Alert and oriented x3.  Head: Normocephalic and atraumatic Ear: TM  normal bilaterally Mouth: no erythema or exudates, MMM Eyes: PERRL, EOMI, conjunctivae normal, No scleral icterus.  Neck: Supple, Trachea midline normal ROM, No JVD, mass, thyromegaly, or carotid bruit present.  Cardiovascular: RRR, S1 normal, S2 normal, no MRG, pulses symmetric and intact bilaterally Pulmonary/Chest: CTAB, no wheezes, rales, or rhonchi Abdominal: Soft. Non-tender, non-distended, bowel sounds are normal, no  masses, organomegaly, or guarding present.  GU: no CVA tenderness Musculoskeletal: No joint deformities, erythema, or stiffness, ROM full and no nontender Hematology: no cervical, inginal, or axillary adenopathy.  Neurological: A&O x3, Strength is normal and symmetric bilaterally, cranial nerve II-XII are grossly intact, no focal motor deficit, sensory intact to light touch bilaterally.  Extremities: Bilateral compression stockings present.  The left leg is significantly larger then the right but with only trace pitting edema to the knee.  No skin lesions noted.  There is no pain to palpation.  Skin: Warm, dry and intact. No rash, cyanosis, or clubbing.  Psychiatric: Normal mood and affect. speech and behavior is normal. Judgment and thought content normal. Cognition and memory are normal.   Assessment & Plan:

## 2011-05-18 DIAGNOSIS — Z515 Encounter for palliative care: Secondary | ICD-10-CM | POA: Insufficient documentation

## 2011-05-18 NOTE — Assessment & Plan Note (Signed)
She continues to struggle with her lymphedema.  We discussed the need to continue elevating the leg as well as avoiding long periods of standing or walking.  She also was instructed to put on her compression stockings even before she sits up in bed to help keep the swelling down after the night.  She will continue to massage the leg.  We will also get her a heavier compression stocking for the left leg since the one from physical therapy she feels is too loose.

## 2011-05-18 NOTE — Assessment & Plan Note (Signed)
Lab Results  Component Value Date   CHOL 139 10/14/2010   Lab Results  Component Value Date   HDL 56 10/14/2010   Lab Results  Component Value Date   LDLCALC 64 10/14/2010   Lab Results  Component Value Date   TRIG 94 10/14/2010   Lab Results  Component Value Date   CHOLHDL 2.5 Ratio 10/14/2010   No results found for this basename: LDLDIRECT    Her current needs for preventative health include blood pressure checks, cholesterol every 3-5 years, and pap smears.  Her last lipid panel was excellent so we will continue to monitor that every 3-5 years unless there is a major weight or diet change.  We had a long discussion today concerning pap smears.  She is at very low risk for cervical cancer but would still be recommended to under go testing with HPV at least once every 3 years.  She does not want any cervical cancer screening at this time.  She was informed that this would be helpful should she become sexually active or if she notices any symptoms including abnormal bleeding or lower abdominal pain.

## 2011-09-29 ENCOUNTER — Encounter: Payer: Self-pay | Admitting: Internal Medicine

## 2011-09-29 ENCOUNTER — Ambulatory Visit (INDEPENDENT_AMBULATORY_CARE_PROVIDER_SITE_OTHER): Payer: Medicare Other | Admitting: Internal Medicine

## 2011-09-29 VITALS — BP 107/72 | HR 61 | Temp 96.8°F | Ht 69.0 in | Wt 170.9 lb

## 2011-09-29 DIAGNOSIS — Z23 Encounter for immunization: Secondary | ICD-10-CM

## 2011-09-29 DIAGNOSIS — S298XXA Other specified injuries of thorax, initial encounter: Secondary | ICD-10-CM

## 2011-09-29 DIAGNOSIS — S20219A Contusion of unspecified front wall of thorax, initial encounter: Secondary | ICD-10-CM

## 2011-09-29 NOTE — Patient Instructions (Signed)
1. You have bruised ribs after the fall.   You can use tylenol 500 mg tablets 2 tablets every 6 hours for the pain.  You could also use Ibuprofen 200 mg tablets 2 tablets 3 times daily with meals for any pain.  2.  Continue using your compression stockings right away in the morning to limit the swelling in your leg.    3.  Follow up in 3-6 months or as needed.

## 2011-09-29 NOTE — Progress Notes (Signed)
Subjective:   Patient ID: Alison Fox female   DOB: 03-31-1971 40 y.o.   MRN: [id number]  HPI: Alison Fox is a 40 y.o. woman who presents to clinic today because she fell on Thursday night.  She had gone to bed and got up to go to the bathroom and she tripped.   She thinks she caught her foot on the carpet.  She denies loss of consciousness, seizure activity, inability to rise after, or extended time on the floor.  Since the fall she has been sore in the ribs and noticed some bruising on her right side.  She denies problems breathing, abdominal pain, nausea, vomiting, change in her stools, or problems eating.  She has been using tylenol for the pain in her ribs.    Past Medical History  Diagnosis Date  . Lymphedema     Chronic following left knee surgery in 2000.   Current Outpatient Prescriptions  Medication Sig Dispense Refill  . Elastic Bandages & Supports (JOBST KNEE HIGH COMPRESSION SM) MISC Wear supports throughout the day.  2 each  0   No family history on file. History   Social History  . Marital Status: Single    Spouse Name: N/A    Number of Children: N/A  . Years of Education: N/A   Social History Main Topics  . Smoking status: Never Smoker   . Smokeless tobacco: None  . Alcohol Use: No  . Drug Use: No  . Sexually Active: None   Other Topics Concern  . None   Social History Narrative  . None   Review of Systems: Constitutional: Denies fever, chills, diaphoresis, appetite change and fatigue.  HEENT: Denies photophobia, eye pain, redness, hearing loss, ear pain, congestion, sore throat, rhinorrhea, sneezing, mouth sores, trouble swallowing, neck pain, neck stiffness and tinnitus.   Respiratory: Positive for rib pain.  Denies SOB, DOE, cough, chest tightness,  and wheezing.   Cardiovascular: Positive for leg swelling. Denies chest pain, palpitations.  Gastrointestinal: Denies nausea, vomiting, abdominal pain, diarrhea, constipation, blood in stool and  abdominal distention.  Genitourinary: Denies dysuria, urgency, frequency, hematuria, flank pain and difficulty urinating.  Musculoskeletal: Denies myalgias, back pain, joint swelling, arthralgias and gait problem.  Skin: Denies pallor, rash and wound.  Neurological: Denies dizziness, seizures, syncope, weakness, light-headedness, numbness and headaches.  Hematological: Denies adenopathy. Easy bruising, personal or family bleeding history  Psychiatric/Behavioral: Denies suicidal ideation, mood changes, confusion, nervousness, sleep disturbance and agitation  Objective:  Physical Exam: Filed Vitals:   09/29/11 1040  BP: 107/72  Pulse: 61  Temp: 96.8 F (36 C)  TempSrc: Oral  Height: 5\' 9"  (1.753 m)  Weight: 170 lb 14.4 oz (77.52 kg)   Constitutional: Vital signs reviewed.  Patient is a well-developed and well-nourished woman in no acute distress and cooperative with exam. Alert and oriented x3.  Head: Normocephalic and atraumatic Ear: TM normal bilaterally Mouth: no erythema or exudates, MMM Eyes: PERRL, EOMI, conjunctivae normal, No scleral icterus.  Neck: Supple, Trachea midline normal ROM, No JVD, mass, thyromegaly, or carotid bruit present.  Cardiovascular: RRR, S1 normal, S2 normal, no MRG, pulses symmetric and intact bilaterally Pulmonary/Chest: there is a small linear bruising over the right lower ribs and one over the superior iliac crest on the right.  CTAB, no wheezes, rales, or rhonchi Abdominal: Soft. Mild tenderness over the RUQ localized to the lower ribs.  non-distended, bowel sounds are normal, no masses, organomegaly, or guarding present.  GU: no CVA tenderness Musculoskeletal: 2+ swelling  in the bilateral lower extremities.  Compression stockings in place.  No joint deformities, erythema, or stiffness, ROM full and no nontender Hematology: no cervical, inginal, or axillary adenopathy.  Neurological: A&O x3, Strength is normal and symmetric bilaterally, cranial nerve  II-XII are grossly intact, no focal motor deficit, sensory intact to light touch bilaterally.  Skin: Warm, dry and intact. No rash, cyanosis, or clubbing.  Psychiatric: Normal mood and affect. speech and behavior is normal. Judgment and thought content normal. Cognition and memory are normal.   Assessment & Plan:

## 2011-10-09 NOTE — Assessment & Plan Note (Signed)
She has some mild bruising from the fall on the right lower ribs.  She has no warning signs for liver injury or indication for x-rays.  We will continue with OTC pain medication for the pain.  She has risk for falls with her swelling and the weight she carries in her legs.  I cautioned her to take her time and to work to clear any clutter in the house to minimize falls.

## 2012-02-05 ENCOUNTER — Ambulatory Visit (INDEPENDENT_AMBULATORY_CARE_PROVIDER_SITE_OTHER): Payer: Medicare Other | Admitting: Internal Medicine

## 2012-02-05 ENCOUNTER — Encounter: Payer: Self-pay | Admitting: Internal Medicine

## 2012-02-05 VITALS — BP 111/76 | HR 67 | Temp 97.2°F | Ht 69.0 in | Wt 180.7 lb

## 2012-02-05 DIAGNOSIS — I890 Lymphedema, not elsewhere classified: Secondary | ICD-10-CM

## 2012-02-05 LAB — COMPREHENSIVE METABOLIC PANEL
ALT: 9 U/L (ref 0–35)
Albumin: 4.1 g/dL (ref 3.5–5.2)
CO2: 31 mEq/L (ref 19–32)
Calcium: 8.7 mg/dL (ref 8.4–10.5)
Chloride: 104 mEq/L (ref 96–112)
Potassium: 4.3 mEq/L (ref 3.5–5.3)
Sodium: 140 mEq/L (ref 135–145)
Total Protein: 6.3 g/dL (ref 6.0–8.3)

## 2012-02-05 MED ORDER — FUROSEMIDE 20 MG PO TABS: 20.0000 mg | ORAL_TABLET | Freq: Every day | ORAL | Status: DC

## 2012-02-05 NOTE — Progress Notes (Signed)
Subjective:   Patient ID: Alison Fox female   DOB: 1970/12/08 41 y.o.   MRN: [id number]  HPI: [name] is a 41 y.o. woman who presents for follow up of her chronic left leg lymphedema.  She states that she has been using the compression stockings but they have not been doing the leg massages.  She went to physical therapy at the lymphedema clinic in June 2012.  She states that after that the swelling was mildly better but has consistently gotten worse since then.  She states that her weight has been going up since that time.  She has some numbness and tingling in the toes but denies pain in the back radiating down the leg, changes in her bowel or bladder habits.    Past Medical History  Diagnosis Date  . Lymphedema     Chronic following left knee surgery in 2000.   Current Outpatient Prescriptions  Medication Sig Dispense Refill  . Elastic Bandages & Supports (JOBST KNEE HIGH COMPRESSION SM) MISC Wear supports throughout the day.  2 each  0   No family history on file. History   Social History  . Marital Status: Single    Spouse Name: N/A    Number of Children: N/A  . Years of Education: N/A   Social History Main Topics  . Smoking status: Never Smoker   . Smokeless tobacco: None  . Alcohol Use: No  . Drug Use: No  . Sexually Active: None   Other Topics Concern  . None   Social History Narrative  . None   Review of Systems: Constitutional: Denies fever, chills, diaphoresis, appetite change and fatigue.  HEENT: Denies photophobia, eye pain, redness, hearing loss, ear pain, congestion, sore throat, rhinorrhea, sneezing, mouth sores, trouble swallowing, neck pain, neck stiffness and tinnitus.   Respiratory: Denies SOB, DOE, cough, chest tightness,  and wheezing.   Cardiovascular: Denies chest pain, palpitations and leg swelling.  Gastrointestinal: Denies nausea, vomiting, abdominal pain, diarrhea, constipation, blood in stool and abdominal distention.    Genitourinary: Denies dysuria, urgency, frequency, hematuria, flank pain and difficulty urinating.  Musculoskeletal: Denies myalgias, back pain, joint swelling, arthralgias and gait problem.  Skin: Denies pallor, rash and wound.  Neurological: Denies dizziness, seizures, syncope, weakness, light-headedness, numbness and headaches.  Hematological: Denies adenopathy. Easy bruising, personal or family bleeding history  Psychiatric/Behavioral: Denies suicidal ideation, mood changes, confusion, nervousness, sleep disturbance and agitation  Objective:  Physical Exam: Filed Vitals:   02/05/12 1324  BP: 111/76  Pulse: 67  Temp: 97.2 F (36.2 C)  TempSrc: Oral  Height: 5\' 9"  (1.753 m)  Weight: 180 lb 11.2 oz (81.965 kg)   Constitutional: Vital signs reviewed.  Patient is a well-developed and well-nourished woman in no acute distress and cooperative with exam. Alert and oriented x3.  Head: Normocephalic and atraumatic Ear: TM normal bilaterally Mouth: no erythema or exudates, MMM Eyes: PERRL, EOMI, conjunctivae normal, No scleral icterus.  Neck: Supple, Trachea midline normal ROM, No JVD, mass, thyromegaly, or carotid bruit present.  Cardiovascular: RRR, S1 normal, S2 normal, no MRG, pulses symmetric and intact bilaterally Pulmonary/Chest: CTAB, no wheezes, rales, or rhonchi Abdominal: Soft. Non-tender, non-distended, bowel sounds are normal, no masses, organomegaly, or guarding present.  GU: no CVA tenderness Musculoskeletal: There is 4+ edema in the left leg from the thigh down.  Right leg has 1+ edema in it.  There is no pain with palpation of the calf.  Pulses are present in the bilateral PT and DP.  No joint deformities, erythema, or stiffness, ROM full and no nontender Hematology: no cervical, inginal, or axillary adenopathy.  Neurological: A&O x3, Strength is normal and symmetric bilaterally, cranial nerve II-XII are grossly intact, no focal motor deficit, sensory intact to light touch  bilaterally.  Skin: Warm, dry and intact. No rash, cyanosis, or clubbing.  Psychiatric: Normal mood and affect. speech and behavior is normal. Judgment and thought content normal. Cognition and memory are normal.   Assessment & Plan:

## 2012-02-05 NOTE — Patient Instructions (Signed)
1. Stop in the lab to have your blood drawn.  We will discuss it at your follow up appointment.  2.  Start Lasix (Furosemide) 20 mg tablets.  Take 1 tablet daily for your swelling.  3.  Follow up in about 1-2 weeks to recheck your numbers.

## 2012-02-16 ENCOUNTER — Encounter: Payer: Self-pay | Admitting: Internal Medicine

## 2012-02-16 ENCOUNTER — Ambulatory Visit (INDEPENDENT_AMBULATORY_CARE_PROVIDER_SITE_OTHER): Payer: Medicare Other | Admitting: Internal Medicine

## 2012-02-16 VITALS — BP 107/56 | HR 63 | Temp 97.3°F | Wt 177.9 lb

## 2012-02-16 DIAGNOSIS — Z0000 Encounter for general adult medical examination without abnormal findings: Secondary | ICD-10-CM

## 2012-02-16 DIAGNOSIS — I890 Lymphedema, not elsewhere classified: Secondary | ICD-10-CM

## 2012-02-16 DIAGNOSIS — Z79899 Other long term (current) drug therapy: Secondary | ICD-10-CM

## 2012-02-16 LAB — LIPID PANEL: Cholesterol: 147 mg/dL (ref 0–200)

## 2012-02-16 MED ORDER — JOBST 20-30MMHG COMPRESSION SM MISC: Status: DC

## 2012-02-16 NOTE — Patient Instructions (Signed)
Please, call with any questions  And follow up with Dr. Tonny Branch (your PCP) on as needed basis). Happy [place]!!!!

## 2012-02-16 NOTE — Progress Notes (Signed)
Patient ID: Alison Fox, female   DOB: 23-Jan-1971, 41 y.o.   MRN: [id number] HPI:    1. Renewal on a  Rx for a compression stocking. Denies any other concerns. Review of Systems: Negative except per history of present illness  Physical Exam:  Nursing notes and vitals reviewed General:  alert, well-developed, and cooperative to examination.   Lungs:  normal respiratory effort, no accessory muscle use, normal breath sounds, no crackles, and no wheezes. Heart:  normal rate, regular rhythm, no murmurs, no gallop, and no rub.   Abdomen:  soft, non-tender, normal bowel sounds, no distention, no guarding, no rebound tenderness, no hepatomegaly, and no splenomegaly.   Extremities:  No cyanosis, clubbing bilaterally; Left lower extreity profound pitting edema 4+/4  Noted; no calf TTP; cap refill <3 sec bilaterally. Neurologic:  alert & oriented X3, nonfocal exam  Meds: reviewed   Allergies: Review of patient's allergies indicates no known allergies. Past Medical History  Diagnosis Date  . Lymphedema     Chronic following left knee surgery in 2000.   Past Surgical History  Procedure Date  . Knee arthroscopy 2000    Left knee.    No family history on file. History   Social History  . Marital Status: Single    Spouse Name: N/A    Number of Children: N/A  . Years of Education: N/A   Occupational History  . Not on file.   Social History Main Topics  . Smoking status: Never Smoker   . Smokeless tobacco: Not on file  . Alcohol Use: No  . Drug Use: No  . Sexually Active: Not on file   Other Topics Concern  . Not on file   Social History Narrative  . No narrative on file    A/P:  #Left lower extremity lymphedema - Rx for a compression stocking renewed -elevate LE above heart level while being seated or supine -continue with lasix  # Health Maintenance -Lipids today -declined WWE -UTD on vaccinations  F/U on PRN basis.

## 2012-03-25 NOTE — Assessment & Plan Note (Signed)
She has not been doing the lymphedema massage and states that she usually doesn't put on the stockings until she has eaten breakfast.  She has no other symptoms at this time.  We will have her restart the massages as well as 20 mg of lasix daily to see if that helps decrease the swelling.  We will check a cmet today as well to check her albumin level as well as to make sure her K and her Cr is normal before we start Lasix

## 2012-05-02 ENCOUNTER — Telehealth: Payer: Self-pay | Admitting: *Deleted

## 2012-05-02 NOTE — Telephone Encounter (Signed)
Needs new Rx for thigh  hi compression stockings 30-40 pressure - Rx was written for knee hi at lower pressure. Went to [employer]. Stanton Kidney Conard Alvira RN 05/02/12 4:45PM

## 2012-11-30 ENCOUNTER — Ambulatory Visit (HOSPITAL_COMMUNITY)
Admission: RE | Admit: 2012-11-30 | Discharge: 2012-11-30 | Disposition: A | Discharge status: Home / Self Care | Payer: Medicare Other | Source: Ambulatory Visit | Attending: Internal Medicine | Admitting: Internal Medicine

## 2012-11-30 ENCOUNTER — Encounter: Payer: Self-pay | Admitting: Internal Medicine

## 2012-11-30 ENCOUNTER — Ambulatory Visit (INDEPENDENT_AMBULATORY_CARE_PROVIDER_SITE_OTHER): Payer: Medicare Other | Admitting: Internal Medicine

## 2012-11-30 VITALS — BP 108/69 | HR 60 | Temp 96.8°F | Ht 69.0 in | Wt 178.1 lb

## 2012-11-30 DIAGNOSIS — Z23 Encounter for immunization: Secondary | ICD-10-CM

## 2012-11-30 DIAGNOSIS — I890 Lymphedema, not elsewhere classified: Secondary | ICD-10-CM

## 2012-11-30 DIAGNOSIS — M25469 Effusion, unspecified knee: Secondary | ICD-10-CM | POA: Insufficient documentation

## 2012-11-30 DIAGNOSIS — M1710 Unilateral primary osteoarthritis, unspecified knee: Secondary | ICD-10-CM | POA: Insufficient documentation

## 2012-11-30 DIAGNOSIS — Z0000 Encounter for general adult medical examination without abnormal findings: Secondary | ICD-10-CM

## 2012-11-30 DIAGNOSIS — M25569 Pain in unspecified knee: Secondary | ICD-10-CM | POA: Insufficient documentation

## 2012-11-30 MED ORDER — NAPROXEN 500 MG PO TABS: 500.0000 mg | ORAL_TABLET | Freq: Two times a day (BID) | ORAL | Status: DC

## 2012-11-30 NOTE — Progress Notes (Signed)
Patient ID: Alison Fox, female   DOB: 1971-10-17, 42 y.o.   MRN: [id number] HPI: Alison Fox is a 42 yo W with chronic lymphedema s/p left knee arthroscopy in 2000 presents today for feels like " it slips out of joint, really sore" on right leg X 1 week in duration. No trauma or fall or surgery. No fever or chills.she is able to bear weight on her right knee. She usually has lymphedema on the left side however she feels like the right leg is beginning to get worse.no other complaints today. She would like a flu vaccine.   ROS: as per HPI  PE: General: alert, well-developed, and cooperative to examination.  Lungs: normal respiratory effort, no accessory muscle use, normal breath sounds, no crackles, and no wheezes. Heart: normal rate, regular rhythm, no murmur, no gallop, and no rub.  Abdomen: soft, non-tender, normal bowel sounds, no distention, no guarding, no rebound tenderness Neurologic: nonfocal Extremity: left leg lymphedema wearing compression stockingbut appears to be loose. Right leg lymphedema but much less than left leg with no erythema on the distal part above ankle. No increase in warmth or drainage noted. Right knee joint: There is mild effusion around the patella in the medio-inferior aspect, no erythema or increased in warmth. Full range of motion of right knee. 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

## 2012-11-30 NOTE — Assessment & Plan Note (Signed)
Flu vaccine administered today.

## 2012-11-30 NOTE — Assessment & Plan Note (Addendum)
She had left knee lymphedema status post left knee arthroscopy in 2000. Now developing right leg lymphedema. The compression stocking on the right as very loose which is adequately controlled her symptoms. She states the physical therapy and [place] is not doing a good job; therefore she would like to go to [facility] for physical therapy.  I do not think she has septic joint in the right given that she is afebrile, able to bear weight, full range of motion. -Will need new fitting of her compression stocking bilaterally -Will get a right knee x-ray to make sure that she did not dislocate her knee however that is unlikely given normal knee exam -will give naproxen 500 mg by mouth twice a day x2 weeks course -Encourage weight loss -Leg elevation -followup with her PCP as needed

## 2012-11-30 NOTE — Patient Instructions (Addendum)
We will refer you to physical therapy in [facility] continue naproxen 500 mg twice daily x2 weeks Will get x-ray of the right knee today and I will call you with any abnormal results Followup as needed

## 2013-01-25 ENCOUNTER — Encounter: Payer: Self-pay | Admitting: Internal Medicine

## 2013-01-25 DIAGNOSIS — R269 Unspecified abnormalities of gait and mobility: Secondary | ICD-10-CM | POA: Insufficient documentation

## 2013-02-24 ENCOUNTER — Ambulatory Visit (HOSPITAL_COMMUNITY)
Admission: RE | Admit: 2013-02-24 | Discharge: 2013-02-24 | Disposition: A | Discharge status: Home / Self Care | Payer: Medicare Other | Source: Ambulatory Visit | Attending: Internal Medicine | Admitting: Internal Medicine

## 2013-02-24 ENCOUNTER — Other Ambulatory Visit: Payer: Self-pay

## 2013-02-24 ENCOUNTER — Encounter: Payer: Self-pay | Admitting: Internal Medicine

## 2013-02-24 ENCOUNTER — Ambulatory Visit (INDEPENDENT_AMBULATORY_CARE_PROVIDER_SITE_OTHER): Payer: Medicare Other | Admitting: Internal Medicine

## 2013-02-24 VITALS — BP 105/68 | HR 79 | Temp 97.3°F | Ht 66.5 in | Wt 176.1 lb

## 2013-02-24 DIAGNOSIS — I8310 Varicose veins of unspecified lower extremity with inflammation: Secondary | ICD-10-CM

## 2013-02-24 DIAGNOSIS — I890 Lymphedema, not elsewhere classified: Secondary | ICD-10-CM

## 2013-02-24 DIAGNOSIS — R079 Chest pain, unspecified: Secondary | ICD-10-CM | POA: Insufficient documentation

## 2013-02-24 DIAGNOSIS — I872 Venous insufficiency (chronic) (peripheral): Secondary | ICD-10-CM | POA: Insufficient documentation

## 2013-02-24 DIAGNOSIS — R0789 Other chest pain: Secondary | ICD-10-CM | POA: Insufficient documentation

## 2013-02-24 MED ORDER — FUROSEMIDE 20 MG PO TABS: 20.0000 mg | ORAL_TABLET | Freq: Every day | ORAL | Status: DC

## 2013-02-24 MED ORDER — FUROSEMIDE 20 MG PO TABS: 20.0000 mg | ORAL_TABLET | Freq: Two times a day (BID) | ORAL | Status: DC

## 2013-02-24 NOTE — Assessment & Plan Note (Signed)
She has chronic lymphedema on the left and is starting to get swelling on her right lower leg now.  She has been using the compression stocking on the left but has not been using the stocking on the right consistently.  She has noted some occasional open wounds but currently there are no signs of cellulitis.  I don't think the whitish patch of skin is in danger of sloughing but we will need to keep a close eye on it.  I feel that this is more scarring from previous episodes of skin breakdown.  I encouraged her to come to [place] when she has a skin breakdown so we can assess it.  We will work on getting her back over to the lymphedema clinic to work on getting the swelling out of her legs.  In reviewing her records she has not had a CBC, Cmet, or urinalysis for sometime to rule out other causes of an underlying reason for her lymphedema.  We also discussed starting a small dose of Lasix today and we decided to start a small dose today and we will bring her back in two weeks to check her electrolytes and see if it is helping.   She was unable to void in clinic today and we did not get a chance to order the CBC so we will have to redraw those when she returns in two weeks to recheck her potassium and kidney function.

## 2013-02-24 NOTE — Progress Notes (Signed)
Subjective:   Patient ID: Alison Fox female   DOB: [birth year] 42 y.o.   MRN: [id number]  HPI: [name] is a 42 y.o. woman who presents to clinic today for follow up on her chronic medical problems including lymphedema.    She is continuing to wear the compression stockings and states that her left leg continues to be swollen.  She denies any fevers, chills, or open wound on the left leg.  She does think that the swelling is getting worse and is higher in her leg then it was previously.  She has not gone to the lymphedema clinic for sometime and would like to go back.    She states that over the last 3 weeks she has noted a patch of white skin on her right shin that is mildly warmer then the rest of the leg.  She states that when it is hot out that the skin will peel and occasionally weep a clear, yellowish fluid.  She states that there is burning pain over the right leg in that same area that gets worse when she is standing on the leg for an extended period of time.   She also states that she has had problems with left chest pain occasionally.  She states that she gets it when she is walking but sometimes also when she is standing.  She states that the pain is in the center of her chest and does not radiate.  She denies any associating symptoms including SOB, DOE, diaphoresis, radiation to the back or neck, regurgitation, reflux, or problems with her food getting caught.  She is currently not having any chest pain.   Past Medical History  Diagnosis Date  . Lymphedema     Chronic following left knee surgery in 2000.   Current Outpatient Prescriptions  Medication Sig Dispense Refill  . Elastic Bandages & Supports (JOBST KNEE HIGH COMPRESSION SM) MISC Wear supports throughout the day.  2 each  0  . naproxen (NAPROSYN) 500 MG tablet Take 1 tablet (500 mg total) by mouth 2 (two) times daily with a meal.  30 tablet  1   No current facility-administered medications for this visit.    No family history on file. History   Social History  . Marital Status: Single    Spouse Name: N/A    Number of Children: N/A  . Years of Education: N/A   Social History Main Topics  . Smoking status: Never Smoker   . Smokeless tobacco: None  . Alcohol Use: No  . Drug Use: No  . Sexually Active: None   Other Topics Concern  . None   Social History Narrative  . None   Review of Systems: Negative except as noted in the HPI.   Objective:  Physical Exam: Filed Vitals:   02/24/13 1328  BP: 105/68  Pulse: 79  Temp: 97.3 F (36.3 C)  TempSrc: Oral  Height: 5' 6.5" (1.689 m)  Weight: 176 lb 1.6 oz (79.878 kg)  SpO2: 98%   Constitutional: Vital signs reviewed.  Patient is a well-developed and well-nourished woman in no acute distress and cooperative with exam. Alert and oriented x3.  Head: Normocephalic and atraumatic Ear: TM normal bilaterally Mouth: no erythema or exudates, MMM Eyes: PERRL, EOMI, conjunctivae normal, No scleral icterus. left strabismus noted.  Neck: Supple, Trachea midline normal ROM, No JVD, mass, thyromegaly, or carotid bruit present.  Cardiovascular: RRR, S1 normal, S2 normal, no MRG, pulses symmetric and intact bilaterally, non-tender to palpation today.  Pulmonary/Chest: CTAB, no wheezes, rales, or rhonchi Abdominal: Soft. Non-tender, non-distended, bowel sounds are normal, no masses, organomegaly, or guarding present.  GU: no CVA tenderness Musculoskeletal: No joint deformities, erythema, or stiffness, ROM full and no nontender Hematology: no cervical, inginal, or axillary adenopathy.  Neurological: A&O x3, Strength is normal and symmetric bilaterally, cranial nerve II-XII are grossly intact, no focal motor deficit, sensory intact to light touch bilaterally.  Skin: Left edema is 4+ with no pitting.  Compression stocking in place and appears to fit well.  Skin over feet is intact with good capillary refill.  Right anterior medial shin shows a patch  3x4.5 cm of whitish skin with an irregular border.  There are changes consistent with venous stasis on the circumferential right lower leg.  The area is warm with no surrounding erythema or open wounds appreciated. Warm, dry and intact. No rash, cyanosis, or clubbing.  Psychiatric: Normal mood and affect. speech and behavior is normal. Judgment and thought content normal. Cognition and memory are normal.   Assessment & Plan:

## 2013-02-24 NOTE — Assessment & Plan Note (Signed)
Her chest pain does not have the usual characteristics of angina but we will check and EKG today to start a possible work up of exertional chest pain.  EKG is normal though she had a hard time holding still to do it.  We will monitor for now and if she continues to have problems we will refer her for an exercise stress test.

## 2013-02-24 NOTE — Assessment & Plan Note (Signed)
Her skin changes are consistent with venous stasis dermatitis.  We discussed using the compression stockings and keeping the skin clean and moisturized. She is having some mild burning and if it continues she would be a candidate for Gabapentin vs amitriptyline vs nortriptyline.

## 2013-02-24 NOTE — Patient Instructions (Addendum)
1.  Stop in the lab to have your blood drawn and leave the urine sample.  I will call you with the results.  2.  Start Lasix 20 mg daily for the swelling.  3.  Follow up in about 2 weeks to recheck your potassium and to see if the fluid pill is helping you.  4.  It has been an absolute pleasure caring for your over the last 3 years.  I wish you all the best in the future.

## 2013-02-25 LAB — COMPREHENSIVE METABOLIC PANEL
ALT: 12 U/L (ref 0–35)
AST: 20 U/L (ref 0–37)
Alkaline Phosphatase: 80 U/L (ref 39–117)
Calcium: 9.5 mg/dL (ref 8.4–10.5)
Chloride: 102 mEq/L (ref 96–112)
Creat: 0.73 mg/dL (ref 0.50–1.10)

## 2013-03-10 ENCOUNTER — Ambulatory Visit (INDEPENDENT_AMBULATORY_CARE_PROVIDER_SITE_OTHER): Payer: Medicare Other | Admitting: Internal Medicine

## 2013-03-10 ENCOUNTER — Encounter: Payer: Self-pay | Admitting: Internal Medicine

## 2013-03-10 VITALS — BP 94/62 | HR 60 | Temp 96.6°F | Ht 65.5 in | Wt 176.9 lb

## 2013-03-10 DIAGNOSIS — I890 Lymphedema, not elsewhere classified: Secondary | ICD-10-CM

## 2013-03-10 NOTE — Progress Notes (Signed)
Thigh hi TED mailed to pt - length 35", calf 21" and thigh 24.5". Alison Kidney Carnel Stegman RN 03/10/13 4PM.

## 2013-03-10 NOTE — Assessment & Plan Note (Signed)
Chronic lymphedema post surgery in 2000. Patient has been treated with multimodality approach at lymphedema clinic in past.  She was treated in [place] in 2012 and has not been to the clinic since then. Last clinic visit with Dr. Tonny Branch, referral was made in [place] [facility] where she appointment in about 3 months. - Lasix 20 mg daily was added. - She complains of worsening of swelling for past [redacted] weeks along with pain in her hip area. - She does not seem to have any pathology but the pain is from worsening swelling and tightness. No rashes or lesions seen. Patient is ambulating okay but gets more pain with it. - She is trying minimal dose of Advil and Tylenol without much relief.  Plan: Advised her to take Advil 400 mg 3 times a day for next [redacted] weeks along with Tylenol as needed. - If her pain gets worse I will send a prescription of tramadol. - If the swelling gets significantly worse, she will call back for an appointment. - Can go up on the dose of Lasix today as her blood pressure is on the low end of normal- 96/62

## 2013-03-10 NOTE — Patient Instructions (Signed)
Please make followup appointment in 2-3 weeks or earlier if needed.  Start taking Advil 400 mg 3-4 times a day with meals for pain. Also continue taking Tylenol as needed for pain. You can take Advil up to 2 weeks.  If your pain and swelling gets worse- give [place] a call for early appointment.

## 2013-03-10 NOTE — Progress Notes (Signed)
  Subjective:    Patient ID: Alison Fox, female    DOB: Nov 14, 1970, 42 y.o.   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  Patient was seen by her PCP on 02/24/2013 and was referred to lymphedema clinic in [facility]- earliest appointment for which will be in next 3 months. Also Lasix 20 mg daily was started during that visit. Her blood pressure today is 94/62 and so I do not have much room to go up on Lasix.  She is still ambulatory but says that is getting difficult for her to ambulate and to work due to worsening left lower extremity swelling which is progressing to the hip. She feels more swelling around the hip region and pain in that region with standing. Denies any radiation of the pain in the lower extremities. Denies any loss of bowel or bladder control.  Denies any fever, chills, nausea vomiting, abdominal pain, diarrhea, headache, palpitations. Review of Systems    as per history of present illness Objective:   Physical Exam  General: resting in bed HEENT: PERRL, EOMI, no scleral icterus Cardiac: RRR, no rubs, murmurs or gallops Pulm: clear to auscultation bilaterally, moving normal volumes of air Abd: soft, nontender, nondistended, BS present Ext: warm and well perfused, left leg lymphedema. Compression stocking in place up to mid thigh. No tenderness of hip region on palpation. Good hip range of motion without significant pain. Neuro: alert and oriented X3, cranial nerves II-XII grossly intact       Assessment & Plan:

## 2013-03-14 NOTE — Progress Notes (Signed)
Case discussed with Dr. Ravi Patel  at the time of the visit, immediately after the resident saw the patient.  I reviewed the resident's history and exam and pertinent patient test results.  I agree with the assessment, diagnosis and plan of care documented in the resident's note.  

## 2013-03-24 ENCOUNTER — Ambulatory Visit (HOSPITAL_COMMUNITY)
Admission: RE | Admit: 2013-03-24 | Discharge: 2013-03-24 | Disposition: A | Discharge status: Home / Self Care | Payer: Medicare Other | Source: Ambulatory Visit | Attending: Internal Medicine | Admitting: Internal Medicine

## 2013-03-24 ENCOUNTER — Ambulatory Visit (INDEPENDENT_AMBULATORY_CARE_PROVIDER_SITE_OTHER): Payer: Medicare Other | Admitting: Internal Medicine

## 2013-03-24 ENCOUNTER — Encounter: Payer: Self-pay | Admitting: Internal Medicine

## 2013-03-24 VITALS — BP 117/75 | HR 49 | Temp 96.9°F | Wt 175.8 lb

## 2013-03-24 DIAGNOSIS — M7989 Other specified soft tissue disorders: Secondary | ICD-10-CM | POA: Insufficient documentation

## 2013-03-24 DIAGNOSIS — I890 Lymphedema, not elsewhere classified: Secondary | ICD-10-CM

## 2013-03-24 DIAGNOSIS — M79609 Pain in unspecified limb: Secondary | ICD-10-CM | POA: Insufficient documentation

## 2013-03-24 LAB — BASIC METABOLIC PANEL WITH GFR
BUN: 12 mg/dL (ref 6–23)
CO2: 29 mEq/L (ref 19–32)
Calcium: 9 mg/dL (ref 8.4–10.5)
Creat: 0.63 mg/dL (ref 0.50–1.10)
Glucose, Bld: 83 mg/dL (ref 70–99)

## 2013-03-24 NOTE — Progress Notes (Signed)
Subjective:   Patient ID: Alison Fox female   DOB: 01-Sep-1971 42 y.o.   MRN: [id number]  HPI: [name] is a 42 y.o.  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  Patient is noted to have left low extremity swelling up to her thigh.  Patient was evaluated by her PCP on 02/24/2013 and was referred to lymphedema clinic in [facility]- earliest appointment for which will be in next 3 months. Also Lasix 20 mg daily was started during that visit.  She reports him he takes Lasix 4 days a week since "it conflicted with my work schedule".  She reports no change on her left lower leg swelling.  Over-the-counter pain medication is helpful with her left hip pain  Past Medical History  Diagnosis Date  . Lymphedema     Chronic following left knee surgery in 2000.   Current Outpatient Prescriptions  Medication Sig Dispense Refill  . acetaminophen (TYLENOL) 325 MG tablet Take 650 mg by mouth every 6 (six) hours as needed for pain.      . Elastic Bandages & Supports (JOBST KNEE HIGH COMPRESSION SM) MISC Wear supports throughout the day.  2 each  0  . furosemide (LASIX) 20 MG tablet Take 1 tablet (20 mg total) by mouth daily.  30 tablet  1  . ibuprofen (ADVIL,MOTRIN) 200 MG tablet Take 200 mg by mouth every 6 (six) hours as needed for pain.       No current facility-administered medications for this visit.   No family history on file. History   Social History  . Marital Status: Single    Spouse Name: N/A    Number of Children: N/A  . Years of Education: N/A   Social History Main Topics  . Smoking status: Never Smoker   . Smokeless tobacco: None  . Alcohol Use: No  . Drug Use: No  . Sexually Active: None   Other Topics Concern  . None   Social History Narrative  . None   Review of Systems: Review of Systems:   Constitutional:  Denies fever, chills, diaphoresis, appetite change and fatigue.   HEENT:  Denies congestion, sore throat, rhinorrhea, sneezing, mouth sores, trouble swallowing, neck pain   Respiratory:  Denies SOB, DOE, cough, and wheezing.   Cardiovascular:  Denies palpitations and leg swelling.   Gastrointestinal:  Denies nausea, vomiting, abdominal pain, diarrhea, constipation, blood in stool and abdominal distention.   Genitourinary:  Denies dysuria, urgency, frequency, hematuria, flank pain and difficulty urinating.   Musculoskeletal:  Denies myalgias, back pain, joint , arthralgias and gait problem.  Positive for left leg swelling   Skin:  Denies pallor, rash and wound.   Neurological:  Denies dizziness, seizures, syncope, weakness, light-headedness, numbness and headaches.    .    Objective:  Physical Exam: Filed Vitals:   03/24/13 1046  BP: 117/75  Pulse: 49  Temp: 96.9 F (36.1 C)  TempSrc: Oral  Weight: 175 lb 12.8 oz (79.742 kg)  SpO2: 100%  General: alert, well-developed, and cooperative to examination.  Head: normocephalic and atraumatic.  Eyes: vision grossly intact, pupils equal, pupils round, pupils reactive to light, no injection and anicteric.  Mouth: pharynx pink and moist, no erythema, and no exudates.  Neck: supple, full ROM, no thyromegaly, no JVD, and no carotid bruits.  Lungs: normal respiratory effort, no accessory  muscle use, normal breath sounds, no crackles, and no wheezes. Heart: normal rate, regular rhythm, no murmur, no gallop, and no rub.  Abdomen: soft, non-tender, normal bowel sounds, no distention, no guarding, no rebound tenderness, no hepatomegaly, and no splenomegaly.  Msk: no joint swelling, no joint warmth, and no redness over joints.  Pulses: 2+ DP/PT pulses bilaterally Extremities: No cyanosis, clubbing.  Positive for left low extremity swelling up to her thigh Neurologic: alert & oriented X3, cranial nerves II-XII intact, strength  normal in all extremities, sensation intact to light touch, and gait normal.  Skin: turgor normal and no rashes.  Psych: Oriented X3, memory intact for recent and remote, normally interactive, good eye contact, not anxious appearing, and not depressed appearing.    Assessment & Plan:

## 2013-03-24 NOTE — Patient Instructions (Addendum)
1. Continue to to take OTC pain medications for your leg pain 2. Follow up with the lymphedema clinic in August as scheduled. 3. Will check your left leg doppler study.

## 2013-03-24 NOTE — Progress Notes (Signed)
Left lower extremity venous duplex completed.  Left:  No evidence of DVT, superficial thrombosis, or Baker's cyst.  Right:  Negative for DVT in the common femoral vein.  

## 2013-03-24 NOTE — Assessment & Plan Note (Addendum)
Chronic lymphedema post knee surgery in 2000.  On Lasix 20 mg daily.  I waiting for her appointment with the lymphedema clinic.    - Will check BMP - Will also check left lower extremity Doppler to rule out DVT since she has never had one done.  - Patient is noncompliant with her Lasix treatment.  I discussed the timing of the Lasix so that she could avoid excessive diuresis during her work time.  Patient was understanding and will take Lasix daily. -Will continue overcome and pain medications as needed since they're helpful.

## 2013-03-28 ENCOUNTER — Telehealth: Payer: Self-pay | Admitting: *Deleted

## 2013-03-28 NOTE — Telephone Encounter (Signed)
Call from Physical Therapist @ Dr Sherene Sires office asking for a Rx  for  " lower extreme lymphedema evaluation and treatment"  Fax [phone]  Att: robin  Referral and notes faxed over to [facility]

## 2013-03-29 NOTE — Progress Notes (Signed)
TEACHING ATTENDING ADDENDUM: I discussed this case with Dr. Li at the time of the patient visit. I agree with the HPI, exam findings and have read the documentation provided by the resident,  and I concur with the plan of care. Please see the resident note for details of management.      

## 2013-04-25 ENCOUNTER — Ambulatory Visit (INDEPENDENT_AMBULATORY_CARE_PROVIDER_SITE_OTHER): Payer: Medicare Other | Admitting: Internal Medicine

## 2013-04-25 ENCOUNTER — Encounter: Payer: Medicare Other | Admitting: Internal Medicine

## 2013-04-25 ENCOUNTER — Encounter: Payer: Self-pay | Admitting: Internal Medicine

## 2013-04-25 VITALS — BP 113/71 | HR 49 | Temp 97.1°F | Wt 170.6 lb

## 2013-04-25 DIAGNOSIS — R5381 Other malaise: Secondary | ICD-10-CM

## 2013-04-25 DIAGNOSIS — R5383 Other fatigue: Secondary | ICD-10-CM

## 2013-04-25 DIAGNOSIS — R42 Dizziness and giddiness: Secondary | ICD-10-CM

## 2013-04-25 DIAGNOSIS — I890 Lymphedema, not elsewhere classified: Secondary | ICD-10-CM

## 2013-04-25 LAB — CBC
Hemoglobin: 12.2 g/dL (ref 12.0–15.0)
RBC: 4.34 MIL/uL (ref 3.87–5.11)

## 2013-04-25 LAB — BASIC METABOLIC PANEL WITH GFR
BUN: 9 mg/dL (ref 6–23)
Chloride: 107 mEq/L (ref 96–112)
GFR, Est Non African American: >89 mL/min
Potassium: 4.2 mEq/L (ref 3.5–5.3)

## 2013-04-25 LAB — TSH: TSH: 2.476 u[IU]/mL (ref 0.350–4.500)

## 2013-04-25 NOTE — Patient Instructions (Addendum)
Alison Fox,  Alison Fox are still on the WAITING LIST at the [facility]. We will try to get you in physical therapy for some more leg exercises. You can decrease taking your lasix to 10 mg if you keep feeling dizzy, and see if that helps you.   Next appointment in 2 months or earlier if you need it.   Thanks, [name] MD MPH 04/25/2013 11:05 AM

## 2013-04-25 NOTE — Progress Notes (Addendum)
Subjective:     Patient ID: Alison Fox, female   DOB: Dec 03, 1970, 42 y.o.   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 She has been referred to a Lymphedema Clinic at [facility], however she is on wait list there and it might take months for her to get a visit. The patient currently has swelling in her leg up to her thighs. She is wearing compression stockings, and has had physical therapy treatments in the past (at [facility]). She does not think the swelling is any better than before. She keeps her leg elevated as much as she can. She has stasis changes in some places, and a healed chronic wound can be seen at the shin.   She is on lasix 20 mg and reports compliance. She takes it at night and then has to go to the bathroom all night to urinate, she says. This is an extremely sweet natured person, I must say, who smiles even when she is explaining her problems. She also complains of dizziness as she gets up from sitting or lying position, and on hot days when she sweats. She drinks a lot of fluids and water. She denies chest pain, palpitations, shortness of breath. Her gait is abnormal because of limited hip and knee movement in the affected leg due to extensive non-pitting edema. She denies depression, falls, diarrhea, constipation, dysuria, dysphagia, reflux symptoms. She admits to fatigue.      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  She basically stands all day at her job. She is a non-smoker and does not use alcohol. Her mother is very concerned about her and accompanied her to the visit.   Review of Systems As per HPI.    Objective:   Physical Exam Vitals reviewed.  General: Sitting in chair, no acute distress.  HEENT: PERRL, EOMI, no scleral icterus. Heart: RRR, no rubs, murmurs or gallops. Lungs: Clear to auscultation bilaterally, no  wheezes, rales, or rhonchi. Extremities: Warm, left leg has massive edema, non-pitting, up to thigh which is unchanged from prior visit, she is wearing stockings. A small wound on the mid-shin of left leg, which is currently healed and does not have any discharge.  Neuro: Alert and oriented X3. No gross neurological abnormalities.     Assessment & Plan:     Problem Based Assessment RTC in 3 months.

## 2013-04-26 DIAGNOSIS — R5383 Other fatigue: Secondary | ICD-10-CM | POA: Insufficient documentation

## 2013-04-26 DIAGNOSIS — R42 Dizziness and giddiness: Secondary | ICD-10-CM | POA: Insufficient documentation

## 2013-04-26 NOTE — Assessment & Plan Note (Signed)
Orthostatic vitals Lying 104/68, Pulse 57 Sitting 115/72, Pulse 52 Standing 113/71, Pulse 49 No dizziness experienced.   Patient stated that she experiences dizziness only on hot days, and drinks a lot of water to avoid that. TSH within normal range, done today. I will pursue this further if the patient has continued symptoms. For now, I have asked the patient to decrease her lasix dose to 10 mg or stop it and see if she still feels dizzy on those days. She says that she is doesn't seem to have benefited by Lasix so far.

## 2013-04-26 NOTE — Assessment & Plan Note (Signed)
We did a CBC no diff and ruled out anemia. TSH is normal. 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

## 2013-04-26 NOTE — Assessment & Plan Note (Addendum)
This patient developed lymphedema s/p knee arthroscopy in 2000. She has been worked up multiple times previously, at [facility], however is not afficliated to a clinic at present. She is awaiting her appointment at the Lymphedema Clinic in [facility], however it may not come for months (RN Purnell Shoemaker personally called the clinic for the patient and checked on that). She says she is not benefited by Lasix, and perhaps is experiencing dizziness because of the diuretic.   At this point, I will refer her to physical therapy one more time, in the hope that it might help her. She has been advised about hygiene and skin care. She is wearing a compression stocking but it seems to be the off the shelf circular knit compression stocking, more like TEDs. She tells me that she had "custom made stockings" but she does not wear them because they are hard and uncomfortable.

## 2013-06-13 NOTE — Addendum Note (Signed)
Addended by: Remus Blake on: 06/13/2013 01:55 PM   Modules accepted: Orders

## 2013-06-27 ENCOUNTER — Encounter: Payer: Self-pay | Admitting: Internal Medicine

## 2013-06-27 ENCOUNTER — Ambulatory Visit (INDEPENDENT_AMBULATORY_CARE_PROVIDER_SITE_OTHER): Payer: Medicare Other | Admitting: Internal Medicine

## 2013-06-27 VITALS — BP 93/59 | HR 50 | Temp 96.9°F | Ht 66.0 in | Wt 169.3 lb

## 2013-06-27 DIAGNOSIS — I872 Venous insufficiency (chronic) (peripheral): Secondary | ICD-10-CM

## 2013-06-27 DIAGNOSIS — Z299 Encounter for prophylactic measures, unspecified: Secondary | ICD-10-CM

## 2013-06-27 DIAGNOSIS — I8310 Varicose veins of unspecified lower extremity with inflammation: Secondary | ICD-10-CM

## 2013-06-27 DIAGNOSIS — Z0000 Encounter for general adult medical examination without abnormal findings: Secondary | ICD-10-CM

## 2013-06-27 DIAGNOSIS — R42 Dizziness and giddiness: Secondary | ICD-10-CM

## 2013-06-27 DIAGNOSIS — I890 Lymphedema, not elsewhere classified: Secondary | ICD-10-CM

## 2013-06-27 NOTE — Progress Notes (Signed)
Subjective:     Patient ID: Alison Fox, female   DOB: 1971-05-12, 42 y.o.   MRN: [id number]  HPI Alison Fox is a 42 year old female who follows with [place] for general preventive care and lymphedema of the left leg secondary to arthroscopy of knee. We have referred her to a lymhedema clinic at [place], however she is on wait list there. We initially tried to treat her with lasix, however that did not benefit her. She uses compression stockings and bandages to keep it under control. She used ibuprofen for her pain during walking. 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 Her new complaint is mild swelling in her right leg as well, with occasional blisters with pus in them. She does not have them at this time.  She keeps low normal blood pressures however, does not feel the postural dizziness that she was feeling last visit. She denies any chest pain, headache, palpitations,  Or shortness of breath.   Review of Systems As per HPI    Objective:   Physical Exam General: No acute distress.  HEENT: PERRL, EOMI.   CV: S1S2 RRR, no murmur Lungs: Bilateral Vesicular breath sounds.  Abdomen: Soft, non-tender, benign  Extremities:  Left - Chronic lymphedema (elephant leg). Currently covered with compression stocking which I did not remove.  Right - Mild swelling around the ankles. Stasis dermatitis changes - erythematous skin overlying calf. Scars seen where pus filled wounds might have been. No current infectious site visualized.    Pedal pulses present.  Neuro: Alert and oriented times 3. No gross deficits seen.      Assessment & Plan:     Please see problem based charting.

## 2013-06-27 NOTE — Assessment & Plan Note (Signed)
Right leg showing mild statis dermatitis with mild erythema, and possible ulcerations with pus which have healed at present. The patient has been instructed to inform the clinic when she get any other blisters/ulcers to avail proper treatment. Advised to wear stockings on right leg as well (along with the left lymphedematous limb).

## 2013-06-27 NOTE — Assessment & Plan Note (Signed)
I am frustrated that I do not have much to offer to this patient with respect to this problem at this time. We will keep checking with the lymphedema clinic she is enrolled in and we will also provide her with their telephone number so she can feel involved and call them personally. I would discontinue lasix at this time given her blood pressure. She reports no new changes in the skin overlying the left leg (which is in stocking at this time)

## 2013-06-27 NOTE — Assessment & Plan Note (Signed)
Flu shot given today

## 2013-06-27 NOTE — Assessment & Plan Note (Signed)
Denies any dizziness this visit. I think it was secondary to lasix and improved once we discontinued lasix.

## 2013-06-27 NOTE — Patient Instructions (Signed)
Alison Fox,  Please keep using your compression stockings. You are in the waitlist of the lymphedema clinic. Please call the clinic if you get pus filled blisters again on your feet.  Let us meet in 6 months  Thanks, [name] MD MPH 06/27/2013 11:04 AM

## 2013-12-26 ENCOUNTER — Ambulatory Visit: Payer: Medicare Other | Admitting: Internal Medicine

## 2013-12-27 ENCOUNTER — Encounter: Payer: Self-pay | Admitting: Internal Medicine

## 2013-12-27 ENCOUNTER — Ambulatory Visit (INDEPENDENT_AMBULATORY_CARE_PROVIDER_SITE_OTHER): Payer: Medicare Other | Admitting: Internal Medicine

## 2013-12-27 VITALS — BP 104/59 | HR 65 | Temp 97.2°F | Ht 69.0 in | Wt 177.2 lb

## 2013-12-27 DIAGNOSIS — I890 Lymphedema, not elsewhere classified: Secondary | ICD-10-CM

## 2013-12-27 MED ORDER — TRAMADOL HCL 50 MG PO TABS: 50.0000 mg | ORAL_TABLET | Freq: Two times a day (BID) | ORAL | Status: DC | PRN

## 2013-12-27 NOTE — Progress Notes (Signed)
Case discussed with Dr. Kollar at the time of the visit.  We reviewed the resident's history and exam and pertinent patient test results.  I agree with the assessment, diagnosis, and plan of care documented in the resident's note.     

## 2013-12-27 NOTE — Progress Notes (Signed)
Subjective:     Patient ID: Alison Fox, female   DOB: January 03, 1971, 43 y.o.   MRN: [id number]  HPI  The patient is a 43 YO female with PMH of lymphedema of left leg s/p knee surgery is coming in today for an acute visit for leg swelling and pain. She is having her usual swelling in the left leg and minimal swelling in the right leg. She is having pain in both feet from the swelling. Her job causes her to be on her feet most of the day which worsens the swelling. She tries to keep on the stockings although she has swelling in the left leg still. She was going to the lymphedema clinic but they were unable to help much with her left leg. She has not traveled recently or been bed-bound. She has no other complaints. She denies any open wounds or sores on her legs. She denies rash or redness on her legs.   Review of Systems  Constitutional: Negative for activity change, appetite change and unexpected weight change.  Respiratory: Negative for cough, chest tightness, shortness of breath and wheezing.   Cardiovascular: Positive for leg swelling. Negative for chest pain and palpitations.  Musculoskeletal: Positive for myalgias. Negative for arthralgias, back pain, gait problem, joint swelling, neck pain and neck stiffness.  Skin: Negative for color change, pallor, rash and wound.  Neurological: Negative for dizziness, tremors, syncope, weakness, light-headedness and numbness.       Objective:   Physical Exam  Constitutional: She is oriented to person, place, and time. She appears well-developed and well-nourished. No distress.  Cardiovascular: Normal rate and regular rhythm.   No murmur heard. Pulmonary/Chest: Effort normal and breath sounds normal. No respiratory distress. She has no wheezes. She has no rales. She exhibits no tenderness.  Musculoskeletal: Normal range of motion. She exhibits edema. She exhibits no tenderness.  Left leg with 3+ edema to the knee, right leg with 1+ edema to mid shin.     Compression stockings in place bilaterally.   Neurological: She is alert and oriented to person, place, and time. No cranial nerve deficit.  Skin: Skin is warm and dry. No rash noted. She is not diaphoretic. No erythema. No pallor.       Assessment/Plan:   1. Please see problem oriented charting.  2. Disposition - To return to her PCP as previously scheduled. Tramadol #60 no refills 1 pill bid prn pain given. Advised to keep legs elevated and in compression stockings.

## 2013-12-27 NOTE — Patient Instructions (Signed)
We will try a medicine for the pain in your legs called tramadol. You can take 1 pill up to 2 times per day. This is a medicine that can make you sleepy so do not drive until you know how it will affect you.   Work on keeping the compression stockings on all the time. Wrapped the areas which are worse may help to decrease the swelling. When possible, keep your legs up when you are at home. They should be elevated to above your heart (2-3 pillows under your legs at least). If you can, stay off of your feet whenever possible.  Come back as previously scheduled and call if you have problems or questions at [phone].  Lymphedema Lymphedema is a swelling caused by the abnormal collection of lymph under the skin. The lymph is fluid from the tissues in your body that travels in the lymphatic system. This system is part of the immune system that includes lymph nodes and vessels. The lymph vessels collect and carry the excess fluid, fats, proteins, and wastes from the tissues of the body to the bloodstream. This system also works to clean and remove bacteria and waste products from the body.  Lymphedema occurs when the lymphatic system is blocked. When the lymph vessels or lymph nodes are blocked or damaged, lymph does not drain properly. This causes abnormal build up of lymph. This leads to swelling in the arms or legs. Lymphedema cannot be cured by medicines. But the swelling can be reduced by physical methods. CAUSES  There are two types of lymphedema. Primary lymphedema is caused by the absence or abnormality of the lymph vessel at birth. It is also known as inherited lymphedema, which occurs rarely. Secondary or acquired lymphedema occurs when the lymph vessel is damaged or blocked. The causes of lymph vessel blockage are:   Skin infection like cellulites.  Infection by parasites (filariasis).  Injury.  Cancer.  Radiation therapy.  Formation of scar tissue.  Surgery. SYMPTOMS  The symptoms  of lymphedema are:  Abnormal swelling of the arm or leg.  Heavy or tight feeling in your arm or leg.  Tight-fitting shoes or rings.  Redness of skin over the affected area.  Limited movement of the affected limb.  Some patients complain about sensitivity to touch and discomfort in the limb(s) affected. You may not have these symptoms immediately following injury. They usually appear within a few days or even years after injury. Inform your caregiver, if you have any of these symptoms. Early treatment can avoid further problems.  DIAGNOSIS  First, your caregiver will inquire about any surgery you have had or medicines you are taking. He will then examine you. Your caregiver may order special imaging tests, such as:  Lymphoscintigraphy (a test in which a low dose of radioactive substance is injected to trace the flow of lymph through the lymph vessels).  MRI (imaging tests using magnetic fields).  Computed tomography (test using special cross-sectional X-rays).  Duplex ultrasound (test using high-frequency sound waves to show the vessels and the blood flow on a screen).  Lymphangiography (special X-ray taken after injecting a contrast dye into the lymph vessel). It is now rarely done. TREATMENT  Lymphedema can be treated in different ways. Your caregiver will decide the type of treatment depending on the cause. Treatment may include:  Exercise: Special exercises will help fluid move out easily from the affected part. This should be done as per your caregiver's advice.  Manual lymph drainage: Gentle massage of the  affected limb makes the fluid to move out more freely.  Compression: Compression stockings or external pump apply pressure over the affected limb. This helps the fluid to move out from the arm or leg. Bandaging can also help to move the fluid out from the affected part. Your caregiver will decide the method that suits you the best.  Medicines: Your caregiver may prescribe  antibiotics, if you have infection.  Surgery: Your caregiver may advise surgery for severe lymphedema. It is reserved for special cases when the patient has difficulty moving. Your surgeon may remove excess tissue from the arm or leg. This will help to ease your movement. Physical therapy may have to be continued after surgery. HOME CARE INSTRUCTIONS  The area is very fragile and is predisposed to injury and infection.  Eat a healthy diet.  Exercise regularly as per advice.  Keep the affected area clean and dry.  Use gloves while cooking or gardening.  Protect your skin from cuts.  Use electric razor to shave the affected area.  Keep affected limb elevated.  Do not wear tight clothes, shoes, or jewelry as it may cause the tissue to be strangled.  Do not use heat pads over the affected area.  Do not sit with cross legs.  Do not walk barefoot.  Do not carry weight on the affected arm.  Avoid having blood pressure checked on the affected limb. SEEK MEDICAL CARE IF:  You continue to have swelling in your limb. SEEK IMMEDIATE MEDICAL CARE IF:   You have high fever.  You have skin rash.  You have chills or sweats.  You have pain or redness.  You have a cut that does not heal. MAKE SURE YOU:   Understand these instructions.  Will watch your condition.  Will get help right away if you are not doing well or get worse. Document Released: 08/02/2007 Document Revised: 09/21/2012 Document Reviewed: 07/08/2009 [facility] Patient Information 2014 [employer] [place].

## 2013-12-27 NOTE — Assessment & Plan Note (Signed)
Appears to be stable per previous reports. Tramadol for pain 50 mg # 60 no refills 1 pill bid prn pain. No concern for DVT and previous doppler negative. Kidney function normal.

## 2013-12-28 ENCOUNTER — Ambulatory Visit: Payer: Medicare Other | Admitting: Internal Medicine

## 2014-02-27 ENCOUNTER — Other Ambulatory Visit: Payer: Self-pay | Admitting: Internal Medicine

## 2014-02-27 ENCOUNTER — Encounter: Payer: Self-pay | Admitting: Internal Medicine

## 2014-02-27 ENCOUNTER — Ambulatory Visit (INDEPENDENT_AMBULATORY_CARE_PROVIDER_SITE_OTHER): Payer: Medicare Other | Admitting: Internal Medicine

## 2014-02-27 ENCOUNTER — Ambulatory Visit (HOSPITAL_COMMUNITY)
Admission: RE | Admit: 2014-02-27 | Discharge: 2014-02-27 | Disposition: A | Discharge status: Home / Self Care | Payer: Medicare Other | Source: Ambulatory Visit | Attending: Internal Medicine | Admitting: Internal Medicine

## 2014-02-27 VITALS — BP 100/61 | HR 60 | Temp 97.7°F | Ht 65.0 in | Wt 173.6 lb

## 2014-02-27 DIAGNOSIS — M7989 Other specified soft tissue disorders: Secondary | ICD-10-CM

## 2014-02-27 DIAGNOSIS — I890 Lymphedema, not elsewhere classified: Secondary | ICD-10-CM

## 2014-02-27 DIAGNOSIS — Z0000 Encounter for general adult medical examination without abnormal findings: Secondary | ICD-10-CM

## 2014-02-27 DIAGNOSIS — R071 Chest pain on breathing: Secondary | ICD-10-CM

## 2014-02-27 LAB — POCT URINE PREGNANCY: Preg Test, Ur: NEGATIVE

## 2014-02-27 MED ORDER — FUROSEMIDE 20 MG PO TABS: 20.0000 mg | ORAL_TABLET | Freq: Every day | ORAL | Status: AC

## 2014-02-27 MED ORDER — MELOXICAM 7.5 MG PO TABS: 7.5000 mg | ORAL_TABLET | Freq: Every day | ORAL | Status: AC

## 2014-02-27 NOTE — Assessment & Plan Note (Signed)
Assessment: No dyspnea, chest xray shows no pathology - likely musculoskeletal pain.   Plan:  Conservative management - heat/ice packs, rest  Continue muscle relaxant gel application  Mobic 7.5 mg daily for a week and then as needed.  Call in 1-2 weeks to notify progress.   Reschedule appointment if no benefit.

## 2014-02-27 NOTE — Assessment & Plan Note (Signed)
Assessment: Unsure etiology - pitting swelling with overlying erythema, stasis related skin changes.   Plan:  Lasix 20 mg lasix daily for 10-14 days  Repeat BMP check in 1-2 wks  Doppler for DVT right leg rule out  Leg elevation

## 2014-02-27 NOTE — Assessment & Plan Note (Signed)
PAP smear - will schedule the patient for a health maintenance visit.

## 2014-02-27 NOTE — Patient Instructions (Addendum)
Alison Fox,   I am happy that [facility] well for you. Please follow up with them for your left leg swelling.   Your right leg seems to have had an infection, which is better now. It appears tense, and swollen, so I will start Lasix 20 mg daily. You take it for 10 days. Please call the clinic about how it helped you - I would like to know. Also, please come back for blood work in 2 weeks.   I am doing a chest Xray for your left side chest pain. We will call you with results if we see anything abnormal.  For your chest pain, which looks muscle pain right now, its ok to apply some muscle relaxant gel.  I have given you a pain killer [facility]) which you can take daily for 1 week and then as needed for pain.  Thanks, Aletta EdouardShilpa Cavion Faiola MD MPH 02/27/2014 11:46 AM

## 2014-02-27 NOTE — Progress Notes (Signed)
Subjective:     Patient ID: Alison Fox, female   DOB: 1971-08-16, 43 y.o.   MRN: [id number]  HPI 43 year old female with left leg lymphedema, comes with her mother to the visit, for routine follow up. Issues we discussed today are:   For lymphedema, she was [facility] from [place] [facility]Dece[place]br 17 2014 and she has done better. It seems because of chronicity, a lot of fibrous tissue formation has occurred and the edema could not come down a whole lot. She is wearing compression stockings. She denies any new problems with that leg.   Her right leg is tense, slightly swollen around ankles and shows erythema. She reports having had an infection however, I do not see any clinic visits that she made for that. She is not sure when she had the infection. Currently, the foot is red but does not look infected.   She complains of left sided chest pain, on the side, which has been going on for a week. It increases with breathing. It is sore to touch or palpate. There is no shortness of breath. There is no limitation of movement of left arm or ribcage. The pain is worst in the morning and gets better during the day. She has been using muscle relaxant gel with some relief. Upon asking if she had a fall, the patient did not given me that information, until her mother intervened and then the patient says, okay, may be I had a fall. Denies chest pressure, nausea, vomiting, dizziness, sweating, palpitations, syncope, wheezing, cough, fever, shortness of breath. Denies weakness of limbs, however cannot walk backwards due to lymphedema and her mother said that was the reason for the fall.   Review of Systems As per HPI    Objective:   Physical Exam General: No acute distress.  HEENT: PERRL, EOMI.   CV: S1S2 RRR, no murmur Chest: Left sided chest under the arm mildly tender to touch.  Lungs: Bilateral Vesicular breath sounds, no crackles, no rhonchi or wheezing. Abdomen: Soft,  non-tender, benign.  Pedal Edema: Left - chronic- foot wrapped in stockings.  Right foot - I asked the patient to remove the stocking - lower half of leg and ankle has erythema, no warmth to touch, no tenderness to palpation, 1+ pitting edema. Two skin openings, which do not look infected. No fluid coming out.  Pedal pulses present.  Neuro: Alert and oriented times 3. No gross deficits seen.      Assessment & Plan:     Please see problem based charting.

## 2014-03-02 ENCOUNTER — Ambulatory Visit (HOSPITAL_COMMUNITY)
Admission: RE | Admit: 2014-03-02 | Discharge: 2014-03-02 | Disposition: A | Discharge status: Home / Self Care | Payer: Medicare Other | Source: Ambulatory Visit | Attending: Internal Medicine | Admitting: Internal Medicine

## 2014-03-02 DIAGNOSIS — M7989 Other specified soft tissue disorders: Secondary | ICD-10-CM

## 2014-03-02 NOTE — Progress Notes (Signed)
VASCULAR LAB PRELIMINARY  PRELIMINARY  PRELIMINARY  PRELIMINARY  Right lower extremity venous duplex completed.    Preliminary report:  Right leg negative for deep and superficial vein thrombosis.   Vanna ScotlandFrances G Sinaya Minogue, RVT 03/02/2014, 5:58 PM

## 2014-05-16 ENCOUNTER — Encounter: Payer: Self-pay | Admitting: Internal Medicine

## 2014-05-16 ENCOUNTER — Ambulatory Visit (INDEPENDENT_AMBULATORY_CARE_PROVIDER_SITE_OTHER): Payer: Medicare Other | Admitting: Internal Medicine

## 2014-05-16 VITALS — BP 95/61 | HR 62 | Temp 96.8°F | Ht 67.0 in | Wt 169.2 lb

## 2014-05-16 DIAGNOSIS — I890 Lymphedema, not elsewhere classified: Secondary | ICD-10-CM

## 2014-05-16 MED ORDER — DIPHENHYDRAMINE-ZINC ACETATE 1-0.1 % EX CREA: TOPICAL_CREAM | Freq: Two times a day (BID) | CUTANEOUS | Status: DC | PRN

## 2014-05-16 NOTE — Patient Instructions (Signed)
Use Benadryl cream topically over the area of itching 3-4 times daily as needed for itching. Keep the area moist bu applying vaseline daily.  Follow up with [place] [facility] as recommended for new compression stockings.

## 2014-05-17 NOTE — Assessment & Plan Note (Addendum)
Chronic lymphedema involving the left lower extremity from foot to the mid thigh, currently stage III, of unclear etiology. Not convinced that it is secondary to left knee arthroscopy in 2000. (Lymphedema started around 3 years ago, as per patient, and the time lag of more than 10 years after the procedure and the fact that the procedure was left knee arthroscopy which has nothing to do with lymphatic drainage system does not convince that this is secondary lymphedema acquired from left knee arthroscopy.) Now presenting with over tightness of the compression stocking resulted in poor aeration to the skin, leading to dryness, itching. Discussed with the attending regarding further management and plans.  Plans: Recommended to apply Benadryl cream topically to help with the itching. Recommended to apply moisturizer daily over the LLE. Recommended to follow up with [place] [facility] for a new compression stockings. Refer to general surgery to see if she would benefit from further work up to determine if she would benefit from lymphatic microsurgery vs lymphatic anastomosis.

## 2014-05-17 NOTE — Progress Notes (Signed)
Subjective:   Patient ID: Alison Fox female   DOB: July 24, 1971 43 y.o.   MRN: [id number]  HPI: Alison Fox is a 43 y.o. woman with PMH significant for Chronic Lymphedema comes to the office with CC of itching, burning pain along the left lower lateral thigh x 1 week.  Patient reports that she uses thigh high compression stockings during most of the day time. She got the current compression stockings from [facility] last year and hasn't changed them ever since. She reports that the compressions being very tight especially at the thigh level. Patient had noticed a itchy spot over the left lateral aspect of the thigh last week. She she scratched the area, it resulted in a skin break down and hence burning pain at the location. She denies any fever, chills, muscle aches. She does reports worsening of her left LE lymphedema.   She denies any other complaints.  Past Medical History  Diagnosis Date  . Lymphedema     Chronic following left knee surgery in 2000.   Current Outpatient Prescriptions  Medication Sig Dispense Refill  . acetaminophen (TYLENOL) 325 MG tablet Take 650 mg by mouth every 6 (six) hours as needed for pain.      . diphenhydrAMINE-zinc acetate (BENADRYL) cream Apply topically 2 (two) times daily as needed for itching.  28.3 g  0  . Elastic Bandages & Supports (JOBST KNEE HIGH COMPRESSION SM) MISC Wear supports throughout the day.  2 each  0  . furosemide (LASIX) 20 MG tablet Take 1 tablet (20 mg total) by mouth daily.  30 tablet  0  . ibuprofen (ADVIL,MOTRIN) 200 MG tablet Take 200 mg by mouth every 6 (six) hours as needed for pain.      . meloxicam (MOBIC) 7.5 MG tablet Take 1 tablet (7.5 mg total) by mouth daily.  30 tablet  0  . traMADol (ULTRAM) 50 MG tablet Take 1 tablet (50 mg total) by mouth 2 (two) times daily as needed for moderate pain.  60 tablet  0   No current facility-administered medications for this visit.   Family History  Problem  Relation Age of Onset  . Heart disease Maternal Aunt   . Heart disease Maternal Uncle   . Stroke Paternal Uncle   . Cancer Paternal Uncle     Melanoma  . Diabetes Maternal Grandmother   . Diabetes Paternal Grandfather    History   Social History  . Marital Status: Single    Spouse Name: N/A    Number of Children: N/A  . Years of Education: N/A   Social History Main Topics  . Smoking status: Never Smoker   . Smokeless tobacco: None  . Alcohol Use: No  . Drug Use: No  . Sexual Activity: None   Other Topics Concern  . None   Social History Narrative  . None   Review of Systems: Pertinent items are noted in HPI. Objective:  Physical Exam: Filed Vitals:   05/16/14 1020  BP: 95/61  Pulse: 62  Temp: 96.8 F (36 C)  TempSrc: Oral  Height: 5\' 7"  (1.702 m)  Weight: 169 lb 3.2 oz (76.749 kg)  SpO2: 98%   Constitutional: Vital signs reviewed.  Patient is a well-developed and well-nourished and is in no acute distress and cooperative with exam.  Cardiovascular: RRR, S1 normal, S2 normal, no MRG Pulmonary/Chest: normal respiratory effort, CTAB, no wheezes, rales, or rhonchi Extremities:  RLE: Trace pedal edema noted extending from ankle to mid  leg. LLE: Chronic appearing moderate to severe lymphedema noted from the foot to mid thigh. There is a skin break down along the anterior aspect of the leg, with no signs of induration cellulitis, any discharge. There is an area of dry skin with few spots of skin break noted along the left lower and lateral aspect of the tight, few inches above the knee. There are no signs of induration, cellulitis, super added infection. 1+Pitting edema also noted in the LLE along with the Lymphedema.  Neurological: A&O x3  Assessment & Plan:

## 2014-05-17 NOTE — Progress Notes (Signed)
Case discussed with Dr. Boggala at the time of the visit.  We reviewed the resident's history and exam and pertinent patient test results.  I agree with the assessment, diagnosis, and plan of care documented in the resident's note. 

## 2014-06-05 ENCOUNTER — Telehealth (INDEPENDENT_AMBULATORY_CARE_PROVIDER_SITE_OTHER): Payer: Self-pay

## 2014-06-05 ENCOUNTER — Ambulatory Visit (INDEPENDENT_AMBULATORY_CARE_PROVIDER_SITE_OTHER): Payer: Medicare Other | Admitting: Surgery

## 2014-06-05 NOTE — Telephone Encounter (Signed)
LMOM for pt to call me before coming to the appt to see Dr Michaell CowingGross today b/c he wants the pt to go to vascular not come see him. Per Dr Michaell CowingGross the pt does not need to come to General Surgery for work up the pt needs to go to vascular or big academic center.

## 2014-06-05 NOTE — Telephone Encounter (Signed)
Tried calling Alison Fox's home again b/c Alison Fox not at work. I did speak to Alison Fox about Dr Michaell CowingGross really not able to offer anything from a general surgery approach. The Alison Fox really needs to see a vascular surgeon or go to a big academic center. Alison Fox understands. I will also notify Dr Bernita BuffyBoggala's office of the cancelation appt today.

## 2014-06-13 NOTE — Addendum Note (Signed)
Addended by: Neomia Dear on: 06/13/2014 08:46 PM   Modules accepted: Orders

## 2014-08-21 ENCOUNTER — Ambulatory Visit (INDEPENDENT_AMBULATORY_CARE_PROVIDER_SITE_OTHER): Payer: Medicare Other | Admitting: Internal Medicine

## 2014-08-21 ENCOUNTER — Encounter: Payer: Self-pay | Admitting: Internal Medicine

## 2014-08-21 VITALS — BP 110/66 | HR 59 | Temp 97.5°F | Ht 67.0 in | Wt 163.7 lb

## 2014-08-21 DIAGNOSIS — H409 Unspecified glaucoma: Secondary | ICD-10-CM

## 2014-08-21 DIAGNOSIS — R51 Headache: Secondary | ICD-10-CM

## 2014-08-21 DIAGNOSIS — H547 Unspecified visual loss: Secondary | ICD-10-CM

## 2014-08-21 DIAGNOSIS — R519 Headache, unspecified: Secondary | ICD-10-CM | POA: Insufficient documentation

## 2014-08-21 NOTE — Patient Instructions (Signed)
Ms Alison Fox,  We will work with your eye doctor to find out the reason for your headaches. Please wear your glasses all the time.  If your headaches do not improve, or increase, or you have more dizziness, please contact the clinic.  I have ordered a CT head for your headaches. The clinic will call you with an appointment.  If your headaches do not get any better with glasses, we will consult neurology.  Please use Tylenol/Advil as needed for your headaches.   Thanks, Aletta EdouardShilpa Devinn Voshell MD MPH 08/21/2014 11:37 AM

## 2014-08-22 DIAGNOSIS — H547 Unspecified visual loss: Secondary | ICD-10-CM | POA: Insufficient documentation

## 2014-08-22 DIAGNOSIS — H409 Unspecified glaucoma: Secondary | ICD-10-CM | POA: Insufficient documentation

## 2014-08-22 HISTORY — DX: Unspecified glaucoma: H40.9

## 2014-08-22 NOTE — Progress Notes (Signed)
Subjective:     Patient ID: Alison Fox, female   DOB: 1971/07/30, 43 y.o.   MRN: [id number]  HPI Alison Fox visits because she is having constant headaches accompanied with dizziness since the past 3 weeks. She denies any hearing problems, nausea or vomiting. She denies increase in headache or dizziness on coughing or bending. She reports that her headache is mostly frontal, 6-7/10, incompletely relieved by tylenol and ibuprofen. It comes on 2-3 times every day since the past 3 weeks and stays for an hour and resolves spontaneously. Band like or squeezing dull nature. She may have been woken up by it at night but she does not feel dizzy at that time. She thinks the dizziness might be associated with the headache but she cannot be sure. It may or may not be associated with posture. She does not feel vertigo. She denies falls, gait disturbance, hearing loss, ear pain, chest pain, palpitations. The patient has reported dizziness with lasix earlier but that has been postural. The patient is not taking lasix daily presently.  She reports that she has had eye problems before since she was a child related to glaucoma. The patient does not report recent worsening of vision. No morning headaches, or redness in left eye. She recently met with her eye care specialist for a routine visit and the notes from that visit have been obtained by [place] and scanned in the media tab in her chart.   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 Her eye sight is 20/50 on right and 20/400 on the left side. Her fundus exam has vitreous floaters, but no other abnormalities. She has been prescribed glasses but she seldom uses them. She denied dizziness at that visit.      Review of Systems  Constitutional: Negative for fever, chills, diaphoresis, fatigue and unexpected weight  change.  HENT: Negative for dental problem.   Eyes: Positive for visual disturbance. Negative for photophobia, pain, discharge, redness and itching.  Respiratory: Negative for shortness of breath.   Cardiovascular: Positive for leg swelling. Negative for chest pain and palpitations.  Gastrointestinal: Negative for nausea and vomiting.  Neurological: Positive for dizziness, light-headedness and headaches. Negative for tremors, seizures, syncope, facial asymmetry, speech difficulty, weakness and numbness.      Objective:   Physical Exam  Constitutional: She is oriented to person, place, and time. She appears well-developed and well-nourished. No distress.  HENT:  Head: Normocephalic and atraumatic.  Right Ear: External ear normal.  Left Ear: External ear normal.  Nose: Nose normal.  Mouth/Throat: Oropharynx is clear and moist. No oropharyngeal exudate.  Eyes: Conjunctivae and EOM are normal. Pupils are equal, round, and reactive to light. Right eye exhibits no discharge. Left eye exhibits no discharge.  Neck: Normal range of motion. Neck supple. No thyromegaly present.  Cardiovascular: Normal rate, regular rhythm and normal heart sounds.   No murmur heard. Pulmonary/Chest: Effort normal and breath sounds normal. No respiratory distress. She has no wheezes. She has no rales.  Abdominal: Soft.  Musculoskeletal: She exhibits edema (left leg).  Lymphadenopathy:    She has no cervical adenopathy.  Neurological: She is alert and oriented to person, place, and time. No cranial nerve deficit. She exhibits normal muscle tone. Coordination normal.  Skin: Skin is warm.  No nystagmus elicited. Visual fields normal on provocation. Left eye - severely  reduced vision, cannot count fingers at 4 feet.       Assessment & Plan:     Please see problem based charting.

## 2014-08-22 NOTE — Assessment & Plan Note (Signed)
Assessment: 43 year old women with new onset headache, moderately severe, with dizziness. Except for sudden onset and severity, no obvious red flags. Associated eye problems but no retinopathy. Most likely this headache and dizziness is related to the patient's non-compliance with her glasses in the presence of severe visual impairment of the left eye creating a sense of imbalance.   Plan:  Advised to wear glasses and will follow up in 2 weeks.   CT head with and without contrast to rule out any obvious pathology.   If this does not resolve, will refer to neurology and re-refer to eye Dr Lovell SheehanJenkins.

## 2014-09-10 ENCOUNTER — Other Ambulatory Visit: Payer: Self-pay | Admitting: Internal Medicine

## 2014-09-10 ENCOUNTER — Ambulatory Visit (HOSPITAL_COMMUNITY)
Admission: RE | Admit: 2014-09-10 | Discharge: 2014-09-10 | Disposition: A | Discharge status: Home / Self Care | Payer: Medicare Other | Source: Ambulatory Visit | Attending: Internal Medicine | Admitting: Internal Medicine

## 2014-09-10 DIAGNOSIS — G919 Hydrocephalus, unspecified: Secondary | ICD-10-CM

## 2014-09-10 DIAGNOSIS — R51 Headache: Principal | ICD-10-CM

## 2014-09-10 DIAGNOSIS — R519 Headache, unspecified: Secondary | ICD-10-CM

## 2014-09-10 DIAGNOSIS — R42 Dizziness and giddiness: Secondary | ICD-10-CM | POA: Insufficient documentation

## 2014-09-10 HISTORY — DX: Hydrocephalus, unspecified: G91.9

## 2014-09-10 NOTE — Progress Notes (Signed)
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 The fourth ventricle is minimally enlarged, and the cerebral aqueduct is not well seen. Aqueductal stenosis is not excluded. MRI could further evaluate if clinically warranted.   Discussed with patient. She does not want to go to a neurosurgeon yet. She would like an appointment with neurology instead.   Asked her to resume her lasix 20mg  once a day. The clinic will call her with an appointment with neurology which might be very difficult to get given that she has no insurance.

## 2014-09-11 NOTE — Progress Notes (Signed)
Edson SnowballShana, thanks so much. This has been taken care of. It was my mistake, I had not looked thoroughly. She has insurance.

## 2014-09-11 NOTE — Progress Notes (Signed)
Dr. Dalphine HandingBhardwaj: Option I can think of would be to have her apply for the [facility] or [facility] discount.  I will have the financial counselor contact her to schedule an appointment.

## 2014-10-30 ENCOUNTER — Ambulatory Visit: Payer: Medicare Other | Admitting: Neurology

## 2014-11-08 ENCOUNTER — Encounter: Payer: Self-pay | Admitting: *Deleted

## 2014-11-09 ENCOUNTER — Ambulatory Visit: Payer: Medicare Other | Admitting: Neurology

## 2014-11-12 ENCOUNTER — Ambulatory Visit: Payer: Medicare Other | Admitting: Neurology

## 2014-11-12 ENCOUNTER — Telehealth: Payer: Self-pay | Admitting: Neurology

## 2014-11-12 NOTE — Telephone Encounter (Signed)
Pt moved appt to 12-27-14 due to weather she was unable to make it 11-12-14 notified the referring dr

## 2014-12-27 ENCOUNTER — Ambulatory Visit (INDEPENDENT_AMBULATORY_CARE_PROVIDER_SITE_OTHER): Payer: Medicare Other | Admitting: Neurology

## 2014-12-27 ENCOUNTER — Encounter: Payer: Self-pay | Admitting: Neurology

## 2014-12-27 VITALS — BP 128/68 | HR 74 | Temp 98.2°F | Resp 20 | Ht 67.0 in | Wt 168.8 lb

## 2014-12-27 DIAGNOSIS — G919 Hydrocephalus, unspecified: Secondary | ICD-10-CM

## 2014-12-27 DIAGNOSIS — G8929 Other chronic pain: Secondary | ICD-10-CM

## 2014-12-27 DIAGNOSIS — R51 Headache: Secondary | ICD-10-CM | POA: Diagnosis not present

## 2014-12-27 DIAGNOSIS — G4441 Drug-induced headache, not elsewhere classified, intractable: Secondary | ICD-10-CM

## 2014-12-27 DIAGNOSIS — G4440 Drug-induced headache, not elsewhere classified, not intractable: Secondary | ICD-10-CM

## 2014-12-27 DIAGNOSIS — R519 Headache, unspecified: Secondary | ICD-10-CM

## 2014-12-27 MED ORDER — NORTRIPTYLINE HCL 25 MG PO CAPS: 25.0000 mg | ORAL_CAPSULE | Freq: Every day | ORAL | Status: DC

## 2014-12-27 NOTE — Patient Instructions (Addendum)
1.  Will start nortriptyline 25mg  at bedtime to help reduce frequency of these headaches.  Call in 4 weeks with update and we can adjust dose or medication if needed. 2.  Limit use of Advil or Tylenol to no more than 2 days out of the week 3.  Stop Tramadol 4.  Will get MRI of brain with and without contrast  01/08/15 10:45am [facility].  Follow up in 2 months.

## 2014-12-27 NOTE — Progress Notes (Signed)
NEUROLOGY CONSULTATION NOTE  Alison Fox MRN: [id number] DOB: November 25, 1970  Referring provider: Dr. Dalphine Handing Primary care provider: Dr. Dalphine Handing  Reason for consult:  Headache  HISTORY OF PRESENT ILLNESS: Alison Fox is a 44 year old right-handed woman with glaucoma and lymphedema who presents for hydrocephalus.  Records and CT of the brain reviewed.  She is accompanied by her mother who provides some history.  Notes from her PCP state that the headaches started in October but she says it started 3 weeks ago.  The headache was bi-frontal, non-throbbing sharp pain, and 8.5-9/10 intensity.  It lasts all day and occurs daily.  It was associated with dizziness but not nausea.  She takes Advil or Tylenol daily.  She denies preceding trauma or precipitating factor.  She has history of occasional mild headache, but nothing like this.  She reportedly has longstanding history of glaucoma since childhood, amblyopia of the left eye.  As per her exam from 08/07/14 by her ophthalmologist, Dr. Ames Coupe, fundi are unremarkable.   A CT of the head performed on 09/10/14 showed symmetric bilateral ventriculomegaly, enlargement of the third ventricle, minimal enlargement of the fourth ventricle and poorly visualized cerebral acqueduct.  Thinning of the overlying cortex suggests this is chronic hydrocephalus.   PAST MEDICAL HISTORY: Past Medical History  Diagnosis Date  . Lymphedema     Chronic following left knee surgery in 2000.  Marland Kitchen Headache     PAST SURGICAL HISTORY: Past Surgical History  Procedure Laterality Date  . Knee arthroscopy  2000    Left knee.     MEDICATIONS: Current Outpatient Prescriptions on File Prior to Visit  Medication Sig Dispense Refill  . acetaminophen (TYLENOL) 325 MG tablet Take 650 mg by mouth every 6 (six) hours as needed for pain.    . diphenhydrAMINE-zinc acetate (BENADRYL) cream Apply topically 2 (two) times daily as needed for itching. 28.3 g 0  . Elastic  Bandages & Supports (JOBST KNEE HIGH COMPRESSION SM) MISC Wear supports throughout the day. 2 each 0  . furosemide (LASIX) 20 MG tablet Take 1 tablet (20 mg total) by mouth daily. 30 tablet 0  . ibuprofen (ADVIL,MOTRIN) 200 MG tablet Take 200 mg by mouth every 6 (six) hours as needed for pain.    . meloxicam (MOBIC) 7.5 MG tablet Take 1 tablet (7.5 mg total) by mouth daily. 30 tablet 0   No current facility-administered medications on file prior to visit.    ALLERGIES: No Known Allergies  FAMILY HISTORY: Family History  Problem Relation Age of Onset  . Heart disease Maternal Aunt   . Heart disease Maternal Uncle   . Stroke Paternal Uncle   . Cancer Paternal Uncle     Melanoma  . Diabetes Maternal Grandmother   . Diabetes Paternal Grandfather     SOCIAL HISTORY: History   Social History  . Marital Status: Single    Spouse Name: N/A  . Number of Children: N/A  . Years of Education: N/A   Occupational History  . Not on file.   Social History Main Topics  . Smoking status: Never Smoker   . Smokeless tobacco: Never Used  . Alcohol Use: No  . Drug Use: No  . Sexual Activity: No   Other Topics Concern  . Not on file   Social History Narrative    REVIEW OF SYSTEMS: Constitutional: No fevers, chills, or sweats, no generalized fatigue, change in appetite Eyes: No visual changes, double vision, eye pain Ear, nose and throat:  No hearing loss, ear pain, nasal congestion, sore throat Cardiovascular: No chest pain, palpitations Respiratory:  No shortness of breath at rest or with exertion, wheezes GastrointestinaI: No nausea, vomiting, diarrhea, abdominal pain, fecal incontinence Genitourinary:  No dysuria, urinary retention or frequency Musculoskeletal:  No neck pain, back pain Integumentary: No rash, pruritus, skin lesions Neurological: as above Psychiatric: No depression, insomnia, anxiety Endocrine: No palpitations, fatigue, diaphoresis, mood swings, change in  appetite, change in weight, increased thirst Hematologic/Lymphatic:  No anemia, purpura, petechiae. Allergic/Immunologic: no itchy/runny eyes, nasal congestion, recent allergic reactions, rashes  PHYSICAL EXAM: Filed Vitals:   12/27/14 0924  BP: 128/68  Pulse: 74  Temp: 98.2 F (36.8 C)  Resp: 20   General: No acute distress Head:  Normocephalic/atraumatic Eyes:  fundi unremarkable, without vessel changes, exudates, hemorrhages or papilledema. Neck: supple, no paraspinal tenderness, full range of motion Back: No paraspinal tenderness Heart: regular rate and rhythm Lungs: Clear to auscultation bilaterally. Vascular: No carotid bruits. Neurological Exam: Mental status: alert and oriented to person, place, and time, recent and remote memory intact, fund of knowledge intact, attention and concentration intact, speech fluent and not dysarthric, language intact. Cranial nerves: CN I: not tested CN II: pupils equal, round and reactive to light, decreased vision in left eye, fundi unremarkable, without vessel changes, exudates, hemorrhages or papilledema. CN III, IV, VI:  Left exotropia, full range of motion, no nystagmus, no ptosis CN V: facial sensation intact CN VII: upper and lower face symmetric CN VIII: hearing intact CN IX, X: gag intact, uvula midline CN XI: sternocleidomastoid and trapezius muscles intact CN XII: tongue midline Bulk & Tone: normal, no fasciculations. Motor:  5/5 throughout Sensation:  Temperature and vibration intact Deep Tendon Reflexes:  2+ throughout, toes downgoing Finger to nose testing:  No dysmetria Heel to shin:  No dysmetria Gait:  Walks with left limp due to lymphedema in left leg. Romberg negative.  IMPRESSION: Chronic daily headaches, probably tension-type and complicated by medication overuse. Hydrocephalus as seen on CT.  Likely chronic and incidental finding, but further imaging is required.  PLAN: 1.  Will start nortriptyline 25mg  at  bedtime 2.  Limit use of Advil or Tylenol to no more than 2 days out of the week. 3.  To investigate any obstruction, will get MRI of brain with and without contrast 4.  Instructed to call in 4 weeks with update and to follow up in 2 months.  Thank you for allowing me to take part in the care of this patient.  Shon MilletAdam Brunilda Eble, DO  CC:  Aletta EdouardShilpa Bhardwaj, MD

## 2015-01-08 ENCOUNTER — Telehealth: Payer: Self-pay | Admitting: Neurology

## 2015-01-08 ENCOUNTER — Ambulatory Visit (HOSPITAL_COMMUNITY)
Admission: RE | Admit: 2015-01-08 | Discharge: 2015-01-08 | Disposition: A | Discharge status: Home / Self Care | Payer: Medicare Other | Source: Ambulatory Visit | Attending: Neurology | Admitting: Neurology

## 2015-01-08 DIAGNOSIS — G919 Hydrocephalus, unspecified: Secondary | ICD-10-CM | POA: Insufficient documentation

## 2015-01-08 DIAGNOSIS — G911 Obstructive hydrocephalus: Secondary | ICD-10-CM | POA: Diagnosis not present

## 2015-01-08 DIAGNOSIS — R519 Headache, unspecified: Secondary | ICD-10-CM

## 2015-01-08 DIAGNOSIS — R51 Headache: Secondary | ICD-10-CM | POA: Diagnosis not present

## 2015-01-08 DIAGNOSIS — G4440 Drug-induced headache, not elsewhere classified, not intractable: Secondary | ICD-10-CM

## 2015-01-08 MED ORDER — GADOBENATE DIMEGLUMINE 529 MG/ML IV SOLN
10.0000 mL | Freq: Once | INTRAVENOUS | Status: AC | PRN
  Administered 2015-01-08: 10 mL via INTRAVENOUS

## 2015-01-08 NOTE — Telephone Encounter (Signed)
I called and spoke to both Ms. Alison Fox and her father (with her permission).  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  I explained what this means and that she should be seen by neurosurgery as she may need a shunt.  Her father appeared to understand the conversation.

## 2015-01-10 ENCOUNTER — Telehealth: Payer: Self-pay | Admitting: Neurology

## 2015-01-10 NOTE — Telephone Encounter (Signed)
I spoke with patient she was asking if we just called her from the hospital I explained to her we had not made a call to her today

## 2015-01-10 NOTE — Telephone Encounter (Signed)
Pt states that she is returning Dr Everlena CooperJaffe call please call [phone]

## 2015-01-10 NOTE — Telephone Encounter (Signed)
Office notes scan and copy of insurance has been sent to [facility]

## 2015-01-14 ENCOUNTER — Telehealth: Payer: Self-pay | Admitting: Neurology

## 2015-01-14 NOTE — Telephone Encounter (Signed)
Alison Fox from [facility] called and states that she has appt on 01-21-15 at 10:00 if you have any questions you can call [phone]

## 2015-01-14 NOTE — Telephone Encounter (Signed)
     Expand All Collapse All   Thayer OhmChris from [facility] called and states that she has appt on 01-21-15 at 10:00 if you have any questions you can call [phone]

## 2015-01-21 DIAGNOSIS — G9389 Other specified disorders of brain: Secondary | ICD-10-CM | POA: Diagnosis not present

## 2015-01-21 DIAGNOSIS — Z6826 Body mass index (BMI) 26.0-26.9, adult: Secondary | ICD-10-CM | POA: Diagnosis not present

## 2015-01-25 ENCOUNTER — Telehealth: Payer: Self-pay | Admitting: Neurology

## 2015-01-25 NOTE — Telephone Encounter (Signed)
Patient states she is still having headaches . She had OV with [facility] on 01/21/15 she states that they gave her some medication it is not helping I advised her to contact the Dr she saw there  And it may take longer than 4 days for it to work .

## 2015-01-25 NOTE — Telephone Encounter (Signed)
Pt called wanting to speak to a nurse regarding her headaches. C/b [phone]

## 2015-04-05 ENCOUNTER — Encounter: Payer: Self-pay | Admitting: Neurology

## 2015-04-05 ENCOUNTER — Ambulatory Visit (INDEPENDENT_AMBULATORY_CARE_PROVIDER_SITE_OTHER): Payer: Medicare Other | Admitting: Neurology

## 2015-04-05 VITALS — BP 112/68 | HR 70 | Resp 18 | Ht 67.0 in | Wt 168.4 lb

## 2015-04-05 DIAGNOSIS — G4441 Drug-induced headache, not elsewhere classified, intractable: Secondary | ICD-10-CM | POA: Diagnosis not present

## 2015-04-05 DIAGNOSIS — G44221 Chronic tension-type headache, intractable: Secondary | ICD-10-CM

## 2015-04-05 DIAGNOSIS — G919 Hydrocephalus, unspecified: Secondary | ICD-10-CM

## 2015-04-05 DIAGNOSIS — G4440 Drug-induced headache, not elsewhere classified, not intractable: Secondary | ICD-10-CM

## 2015-04-05 MED ORDER — NORTRIPTYLINE HCL 50 MG PO CAPS: 50.0000 mg | ORAL_CAPSULE | Freq: Every day | ORAL | Status: DC

## 2015-04-05 NOTE — Progress Notes (Signed)
NEUROLOGY FOLLOW UP OFFICE NOTE  Alison Fox [id number]  HISTORY OF PRESENT ILLNESS: Alison Fox is a 44 year old right-handed woman with glaucoma and lymphedema who follows up for chronic daily headache and hydrocephalus.  MRI of the brain reviewed.  She is accompanied by her mother who provides some history.  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  She was referred to neurosurgery, whom she saw on 01/21/15.  I do not have the notes.  Plan is to repeat MRI next month.  She was started on nortriptyline 25mg  at bedtime.  She still has chronic daily headache, described as a pressure.  She takes Advil or Tylenol at least 4 times a week.Marland Kitchen  HISTORY: Headaches started in October but she says it started 3 months ago.  The headaches are bi-frontal, non-throbbing sharp pain, and 8.5-9/10 intensity.  They last all day and occurs daily.  They are associated with dizziness but not nausea.  She was taking Advil or Tylenol daily.  She denies preceding trauma or precipitating factor.  She has history of occasional mild headache, but nothing like this.  She reportedly has longstanding history of glaucoma since childhood, amblyopia of the left eye.  As per her exam from 08/07/14 by her ophthalmologist, Dr. Ames Coupe, fundi are unremarkable.    A CT of the head performed on 09/10/14 showed symmetric bilateral ventriculomegaly, enlargement of the third ventricle, minimal enlargement of the fourth ventricle and poorly visualized cerebral acqueduct.  Thinning of the overlying cortex suggests this is chronic hydrocephalus.  PAST MEDICAL HISTORY: Past Medical History  Diagnosis Date  . Lymphedema     Chronic following left knee surgery in 2000.  Marland Kitchen Headache     MEDICATIONS: Current Outpatient Prescriptions on File Prior to Visit  Medication Sig Dispense Refill  . acetaminophen (TYLENOL)  325 MG tablet Take 650 mg by mouth every 6 (six) hours as needed for pain.    . diphenhydrAMINE-zinc acetate (BENADRYL) cream Apply topically 2 (two) times daily as needed for itching. 28.3 g 0  . Elastic Bandages & Supports (JOBST KNEE HIGH COMPRESSION SM) MISC Wear supports throughout the day. 2 each 0  . ibuprofen (ADVIL,MOTRIN) 200 MG tablet Take 200 mg by mouth every 6 (six) hours as needed for pain.     No current facility-administered medications on file prior to visit.    ALLERGIES: No Known Allergies  FAMILY HISTORY: Family History  Problem Relation Age of Onset  . Heart disease Maternal Aunt   . Heart disease Maternal Uncle   . Stroke Paternal Uncle   . Cancer Paternal Uncle     Melanoma  . Diabetes Maternal Grandmother   . Diabetes Paternal Grandfather     SOCIAL HISTORY: History   Social History  . Marital Status: Single    Spouse Name: N/A  . Number of Children: N/A  . Years of Education: N/A   Occupational History  . Not on file.   Social History Main Topics  . Smoking status: Never Smoker   . Smokeless tobacco: Never Used  . Alcohol Use: No  . Drug Use: No  . Sexual Activity: No   Other Topics Concern  . Not on file   Social History Narrative    REVIEW OF SYSTEMS: Constitutional: No fevers, chills, or sweats, no generalized fatigue, change in appetite Eyes: No visual changes, double vision, eye pain Ear, nose and throat: No hearing  loss, ear pain, nasal congestion, sore throat Cardiovascular: No chest pain, palpitations Respiratory:  No shortness of breath at rest or with exertion, wheezes GastrointestinaI: No nausea, vomiting, diarrhea, abdominal pain, fecal incontinence Genitourinary:  No dysuria, urinary retention or frequency Musculoskeletal:  No neck pain, back pain Integumentary: No rash, pruritus, skin lesions Neurological: as above Psychiatric: No depression, insomnia, anxiety Endocrine: No palpitations, fatigue, diaphoresis, mood  swings, change in appetite, change in weight, increased thirst Hematologic/Lymphatic:  No anemia, purpura, petechiae. Allergic/Immunologic: no itchy/runny eyes, nasal congestion, recent allergic reactions, rashes  PHYSICAL EXAM: Filed Vitals:   04/05/15 0905  BP: 112/68  Pulse: 70  Resp: 18   General: No acute distress Head:  Normocephalic/atraumatic Eyes: Unable to visualize fundi on inspection Neck: supple, no paraspinal tenderness, full range of motion Heart:  Regular rate and rhythm Lungs:  Clear to auscultation bilaterally Back: No paraspinal tenderness Neurological Exam: alert and oriented to person, place, and time. Attention span and concentration intact, recent and remote memory intact, fund of knowledge intact.  Speech fluent and not dysarthric, language intact.  Left exotropia.  Otherwise, CN II-XII intact. Bulk and tone normal, muscle strength 5/5 throughout.  Sensation to light touch, temperature and vibration intact.  Deep tendon reflexes 2+ throughout, toes downgoing.  Finger to nose and heel to shin testing intact.  Gait normal, Romberg negative.  IMPRESSION: Chronic tension-type headache complicated by medication overuse Hydrocephalus  PLAN: 1.  Increase nortriptyline to  at bedtime and call in 4 weeks with update. 2.  Limit use of pain relievers to no more than 2 days out of the week 3.  Follow up with neurosurgery.  Will get notes from them. 4.  Follow up in 3 months.  Shon Millet, DO  CC: [name], MD

## 2015-04-05 NOTE — Patient Instructions (Signed)
Increase nortriptyline to 50mg  at bedtime.  I sent a new prescription to your pharmacy.  CALL IN 4 WEEKS WITH UPDATE Limit use of all pain relievers (Tylenol, Advil) to no more than 2 days out of the week Follow up in 3 months.

## 2015-04-19 ENCOUNTER — Telehealth: Payer: Self-pay | Admitting: Internal Medicine

## 2015-04-19 ENCOUNTER — Telehealth: Payer: Self-pay | Admitting: *Deleted

## 2015-04-19 NOTE — Telephone Encounter (Signed)
Patient returning phone call back to confirm appt given for 04/24/2015 @ 9:15am with Dr. Johna Rolesabbani regarding her Headaches.

## 2015-04-19 NOTE — Telephone Encounter (Signed)
Pt called - states head has been hurting past 3-4 weeks. Takin OTC med like Tylenol or Advil with no relief. Pt states she takes something for sleep - no problems with head during sleep. Appt made Dr Johna Rolesabbani 04/24/15 9:15AM - pt aware.  If head discomfort gets worse - suggest ER. Alison KidneyDebra Orma Cheetham RN 04/19/15 9:40AM

## 2015-04-19 NOTE — Telephone Encounter (Signed)
Talked with pt - confirm her appt 04/24/15 9:15AM Dr Johna Rolesabbani. Stanton KidneyDebra Orilla Templeman RN 04/19/15 3PM

## 2015-04-23 ENCOUNTER — Telehealth: Payer: Self-pay | Admitting: Internal Medicine

## 2015-04-23 NOTE — Telephone Encounter (Signed)
Call to patient to confirm appointment for 04/24/15 at 9:15 no answer

## 2015-04-24 ENCOUNTER — Ambulatory Visit: Payer: Medicare Other | Admitting: Internal Medicine

## 2015-04-26 DIAGNOSIS — G9389 Other specified disorders of brain: Secondary | ICD-10-CM | POA: Diagnosis not present

## 2015-04-26 DIAGNOSIS — R51 Headache: Secondary | ICD-10-CM | POA: Diagnosis not present

## 2015-05-03 ENCOUNTER — Telehealth: Payer: Self-pay | Admitting: *Deleted

## 2015-05-03 NOTE — Telephone Encounter (Signed)
If she is not having side effects to nortriptyline, she can increase it to  qhs.

## 2015-05-03 NOTE — Telephone Encounter (Signed)
Patient states her headache are still really bad and she has been missing work with the pain she states the nortriptyline is not working for her Call back number [phone]

## 2015-05-03 NOTE — Telephone Encounter (Signed)
Please advise.  Dr. Everlena CooperJaffe is gone for the day.

## 2015-05-03 NOTE — Telephone Encounter (Signed)
Left message giving patient instructions. 

## 2015-05-13 DIAGNOSIS — Z6825 Body mass index (BMI) 25.0-25.9, adult: Secondary | ICD-10-CM | POA: Diagnosis not present

## 2015-05-13 DIAGNOSIS — G910 Communicating hydrocephalus: Secondary | ICD-10-CM | POA: Diagnosis not present

## 2015-05-29 ENCOUNTER — Other Ambulatory Visit: Payer: Self-pay | Admitting: Neurology

## 2015-06-15 ENCOUNTER — Emergency Department (INDEPENDENT_AMBULATORY_CARE_PROVIDER_SITE_OTHER)
Admission: EM | Admit: 2015-06-15 | Discharge: 2015-06-15 | Disposition: A | Discharge status: Nursing Home (Includes ICF) | Payer: Medicare Other | Source: Home / Self Care

## 2015-06-15 ENCOUNTER — Emergency Department (HOSPITAL_COMMUNITY)
Admission: EM | Admit: 2015-06-15 | Discharge: 2015-06-15 | Disposition: A | Discharge status: Home / Self Care | Payer: Medicare Other | Attending: Emergency Medicine | Admitting: Emergency Medicine

## 2015-06-15 ENCOUNTER — Encounter (HOSPITAL_COMMUNITY): Payer: Self-pay | Admitting: Nurse Practitioner

## 2015-06-15 ENCOUNTER — Encounter (HOSPITAL_COMMUNITY): Payer: Self-pay | Admitting: Emergency Medicine

## 2015-06-15 DIAGNOSIS — Y9222 Religious institution as the place of occurrence of the external cause: Secondary | ICD-10-CM | POA: Diagnosis not present

## 2015-06-15 DIAGNOSIS — R55 Syncope and collapse: Secondary | ICD-10-CM | POA: Diagnosis not present

## 2015-06-15 DIAGNOSIS — T148 Other injury of unspecified body region: Secondary | ICD-10-CM | POA: Diagnosis not present

## 2015-06-15 DIAGNOSIS — Y93J1 Activity, piano playing: Secondary | ICD-10-CM | POA: Insufficient documentation

## 2015-06-15 DIAGNOSIS — S0101XA Laceration without foreign body of scalp, initial encounter: Secondary | ICD-10-CM | POA: Diagnosis not present

## 2015-06-15 DIAGNOSIS — Z79899 Other long term (current) drug therapy: Secondary | ICD-10-CM | POA: Diagnosis not present

## 2015-06-15 DIAGNOSIS — W208XXA Other cause of strike by thrown, projected or falling object, initial encounter: Secondary | ICD-10-CM | POA: Insufficient documentation

## 2015-06-15 DIAGNOSIS — Y998 Other external cause status: Secondary | ICD-10-CM | POA: Diagnosis not present

## 2015-06-15 DIAGNOSIS — IMO0002 Reserved for concepts with insufficient information to code with codable children: Secondary | ICD-10-CM

## 2015-06-15 LAB — I-STAT CHEM 8, ED
BUN: 14 mg/dL (ref 6–20)
CALCIUM ION: 1.12 mmol/L (ref 1.12–1.23)
CHLORIDE: 99 mmol/L — AB (ref 101–111)
CREATININE: 0.7 mg/dL (ref 0.44–1.00)
GLUCOSE: 97 mg/dL (ref 65–99)
HCT: 39 % (ref 36.0–46.0)
Hemoglobin: 13.3 g/dL (ref 12.0–15.0)
Potassium: 4 mmol/L (ref 3.5–5.1)
Sodium: 141 mmol/L (ref 135–145)
TCO2: 26 mmol/L (ref 0–100)

## 2015-06-15 MED ORDER — LIDOCAINE-EPINEPHRINE (PF) 2 %-1:200000 IJ SOLN
10.0000 mL | Freq: Once | INTRAMUSCULAR | Status: AC
  Administered 2015-06-15: 10 mL via INTRADERMAL
  Filled 2015-06-15: qty 20

## 2015-06-15 NOTE — Discharge Instructions (Signed)

## 2015-06-15 NOTE — ED Provider Notes (Signed)
CSN: [id number]     Arrival date & time 06/15/15  1538 History   None    Chief Complaint  Patient presents with  . Head Laceration   (Consider location/radiation/quality/duration/timing/severity/associated sxs/prior Treatment) Patient is a 44 y.o. female presenting with scalp laceration and near-syncope. The history is provided by the patient. No language interpreter was used.  Head Laceration This is a new problem. The current episode started 1 to 2 hours ago (He was at church when she suddenly felt light headed and fell backward on the paino). The problem has not changed since onset.Associated symptoms include headaches. Pertinent negatives include no shortness of breath. Nothing aggravates the symptoms. Nothing relieves the symptoms.  Near Syncope This is a new problem. The current episode started 1 to 2 hours ago. Progression since onset: She fell dizzy and fell, felt she might have lost consciousness but she is uncertain. Associated symptoms include headaches. Pertinent negatives include no shortness of breath. Nothing aggravates the symptoms. Nothing relieves the symptoms.    Past Medical History  Diagnosis Date  . Lymphedema     Chronic following left knee surgery in 2000.  Marland Kitchen Headache    Past Surgical History  Procedure Laterality Date  . Knee arthroscopy  2000    Left knee.    Family History  Problem Relation Age of Onset  . Heart disease Maternal Aunt   . Heart disease Maternal Uncle   . Stroke Paternal Uncle   . Cancer Paternal Uncle     Melanoma  . Diabetes Maternal Grandmother   . Diabetes Paternal Grandfather    Social History  Substance Use Topics  . Smoking status: Never Smoker   . Smokeless tobacco: Never Used  . Alcohol Use: No   OB History    No data available     Review of Systems  Respiratory: Negative.  Negative for shortness of breath.   Cardiovascular: Positive for near-syncope.  Gastrointestinal: Negative.   Skin: Positive for wound.    Neurological: Positive for light-headedness and headaches.  All other systems reviewed and are negative.  Filed Vitals:   06/15/15 1553  BP: 118/74  Pulse: 117  Temp: 97.5 F (36.4 C)  TempSrc: Oral  Resp: 18  SpO2: 96%     Allergies  Review of patient's allergies indicates no known allergies.  Home Medications   Prior to Admission medications   Medication Sig Start Date End Date Taking? Authorizing Provider  nortriptyline (PAMELOR) 25 MG capsule TAKE 1 CAPSULE (25 MG TOTAL) BY MOUTH AT BEDTIME. 05/29/15  Yes Drema Dallas, DO  acetaminophen (TYLENOL) 325 MG tablet Take 650 mg by mouth every 6 (six) hours as needed for pain.    Historical Provider, MD  diphenhydrAMINE-zinc acetate (BENADRYL) cream Apply topically 2 (two) times daily as needed for itching. 05/16/14   Cathlean Cower, MD  Elastic Bandages & Supports (JOBST KNEE HIGH COMPRESSION SM) MISC Wear supports throughout the day. 02/16/12   Denna Haggard, MD  ibuprofen (ADVIL,MOTRIN) 200 MG tablet Take 200 mg by mouth every 6 (six) hours as needed for pain.    Historical Provider, MD  nortriptyline (PAMELOR) 50 MG capsule Take 1 capsule (50 mg total) by mouth at bedtime. 04/05/15   Drema Dallas, DO   Meds Ordered and Administered this Visit  Medications - No data to display  BP 118/74 mmHg  Pulse 117  Temp(Src) 97.5 F (36.4 C) (Oral)  Resp 18  SpO2 96%  LMP 06/08/2015 No data found.  Physical Exam  Constitutional: She is oriented to person, place, and time. She appears well-developed. No distress.  HENT:  Head: Normocephalic.    Eyes: Pupils are equal, round, and reactive to light.  Neck: Neck supple.  Cardiovascular: Normal rate, regular rhythm and normal heart sounds.   No murmur heard. Pulmonary/Chest: Effort normal and breath sounds normal. No respiratory distress. She has no wheezes.  Neurological: She is alert and oriented to person, place, and time. No cranial nerve deficit.  Nursing note  and vitals reviewed.   ED Course  Procedures (including critical care time)  Labs Review Labs Reviewed - No data to display  Imaging Review No results found.   Visual Acuity Review  Right Eye Distance:   Left Eye Distance:   Bilateral Distance:    Right Eye Near:   Left Eye Near:    Bilateral Near:         MDM  No diagnosis found. Laceration  Near syncope  Unable to fully assess her scalp laceration. Recommended hair shaving but she is not up for that now. Due to associated history of syncope or near syncope as well as head injury, she will benefit from CT scan of the head. Patient will be transferred to the ED for assessment and further management.  Doreene Eland, MD 06/15/15 [phone]

## 2015-06-15 NOTE — ED Notes (Signed)
Sent from [facility] for further evaluation of head laceration after becoming dizzy at church and falling into piano. Denies LOC. A&Ox4, now. Laceration dressed by [facility] at this time. Denies n/v/vision changes.

## 2015-06-15 NOTE — ED Provider Notes (Signed)
CSN: [id number]     Arrival date & time 06/15/15  1726 History   First MD Initiated Contact with Patient 06/15/15 1750     Chief Complaint  Patient presents with  . Head Injury     (Consider location/radiation/quality/duration/timing/severity/associated sxs/prior Treatment) Patient is a 44 y.o. female presenting with skin laceration.  Laceration Location:  Head/neck Head/neck laceration location:  Scalp Length (cm):  4 Depth:  Cutaneous Quality: straight   Bleeding: controlled   Time since incident:  5 hours Laceration mechanism:  Blunt object Pain details:    Quality:  Aching   Severity:  Mild   Timing:  Constant   Progression:  Unchanged Foreign body present:  No foreign bodies Relieved by:  None tried Worsened by:  Nothing tried Ineffective treatments:  None tried Tetanus status:  Up to date   Past Medical History  Diagnosis Date  . Lymphedema     Chronic following left knee surgery in 2000.  Marland Kitchen Headache    Past Surgical History  Procedure Laterality Date  . Knee arthroscopy  2000    Left knee.    Family History  Problem Relation Age of Onset  . Heart disease Maternal Aunt   . Heart disease Maternal Uncle   . Stroke Paternal Uncle   . Cancer Paternal Uncle     Melanoma  . Diabetes Maternal Grandmother   . Diabetes Paternal Grandfather    Social History  Substance Use Topics  . Smoking status: Never Smoker   . Smokeless tobacco: Never Used  . Alcohol Use: No   OB History    No data available     Review of Systems  Constitutional: Negative for fever and chills.  HENT: Negative for congestion and sore throat.   Eyes: Negative for visual disturbance.  Respiratory: Negative for cough, shortness of breath and wheezing.   Cardiovascular: Negative for chest pain.  Gastrointestinal: Negative for nausea, vomiting, abdominal pain, diarrhea and constipation.  Genitourinary: Negative for dysuria, difficulty urinating and vaginal pain.  Musculoskeletal:  Negative for myalgias and arthralgias.  Skin: Positive for wound.  Neurological: Positive for light-headedness and headaches. Negative for syncope.  Psychiatric/Behavioral: Negative for behavioral problems.  All other systems reviewed and are negative.     Allergies  Review of patient's allergies indicates no known allergies.  Home Medications   Prior to Admission medications   Medication Sig Start Date End Date Taking? Authorizing Provider  acetaminophen (TYLENOL) 325 MG tablet Take 650 mg by mouth every 6 (six) hours as needed for pain.   Yes Historical Provider, MD  nortriptyline (PAMELOR) 50 MG capsule Take 1 capsule (50 mg total) by mouth at bedtime. 04/05/15  Yes Drema Dallas, DO  diphenhydrAMINE-zinc acetate (BENADRYL) cream Apply topically 2 (two) times daily as needed for itching. Patient not taking: Reported on 06/15/2015 05/16/14   Cathlean Cower, MD  Elastic Bandages & Supports (JOBST KNEE HIGH COMPRESSION SM) MISC Wear supports throughout the day. Patient not taking: Reported on 06/15/2015 02/16/12   Denna Haggard, MD  nortriptyline (PAMELOR) 25 MG capsule TAKE 1 CAPSULE (25 MG TOTAL) BY MOUTH AT BEDTIME. Patient not taking: Reported on 06/15/2015 05/29/15   Drema Dallas, DO   BP 104/58 mmHg  Pulse 85  Temp(Src) 98.6 F (37 C) (Oral)  Resp 21  Wt 168 lb (76.204 kg)  SpO2 100%  LMP 06/08/2015 Physical Exam  Constitutional: She is oriented to person, place, and time. She appears well-developed and well-nourished. No distress.  HENT:  Head: Normocephalic and atraumatic.    Eyes: EOM are normal.  Neck: Normal range of motion.  Cardiovascular: Normal rate, regular rhythm and normal heart sounds.   No murmur heard. Pulmonary/Chest: Effort normal and breath sounds normal. No respiratory distress. She has no wheezes.  Abdominal: Soft. There is no tenderness.  Musculoskeletal: She exhibits no edema.  Neurological: She is alert and oriented to person, place, and  time.  Skin: She is not diaphoretic.  Psychiatric: She has a normal mood and affect. Her behavior is normal.    ED Course  LACERATION REPAIR Date/Time: 06/15/2015 7:28 PM Performed by: Beverely Risen Authorized by: Beverely Risen Consent: Verbal consent obtained. Body area: head/neck Location details: scalp Laceration length: 4 cm Foreign bodies: no foreign bodies Tendon involvement: none Nerve involvement: none Vascular damage: no Local anesthetic: lidocaine 2% with epinephrine Anesthetic total: 4 ml Preparation: Patient was prepped and draped in the usual sterile fashion. Irrigation solution: saline Irrigation method: syringe Amount of cleaning: standard Debridement: none Degree of undermining: none Skin closure: staples Number of sutures: 4 Approximation: close Approximation difficulty: simple Dressing: antibiotic ointment Patient tolerance: Patient tolerated the procedure well with no immediate complications   (including critical care time) Labs Review Labs Reviewed  I-STAT CHEM 8, ED - Abnormal; Notable for the following:    Chloride 99 (*)    All other components within normal limits    Imaging Review No results found. I have personally reviewed and evaluated these images and lab results as part of my medical decision-making.   EKG Interpretation   Date/Time:  Saturday June 15 2015 19:36:20 EDT Ventricular Rate:  88 PR Interval:  140 QRS Duration: 68 QT Interval:  364 QTC Calculation: 440 R Axis:   69 Text Interpretation:  Normal sinus rhythm No significant change since last  tracing Confirmed by RAY MD, Duwayne Heck [phone]) on 06/15/2015 7:43:39 PM      MDM   Final diagnoses:  Laceration   Patient is a 44 year old female with a history of hydrocephalus that is being followed by neurology presents after a near syncope and fall resulting in a for similar head laceration. Patient states her near syncopal episode was very similar to all her prior episodes  and has no neurologic deficit at this time. Patient states her tetanus is up-to-date. On route to the ED the patient has normal neuro exam with a for similar laceration to the scalp of the left occipital region. It was hemostatic upon arrival. Wound was repaired as above. Patient is to follow up with PCP. Patient given wound care instructions and is to have her staples removed in one week.  EKG normal sinus rhythm without evidence of WPW, Brugada, long QT.  Labs are reassuring.     Beverely Risen, MD 06/15/15 2000  Margarita Grizzle, MD 06/16/15 [phone]

## 2015-06-15 NOTE — ED Notes (Signed)
Pt reports she fell backwards today at church and hit the back of head today around 1500 Laceration to back of scalp Alert and oriented x4 No acute distress.

## 2015-06-15 NOTE — ED Notes (Signed)
Pt sts she is just experiencing "heaviness" in her head, but denies any pain

## 2015-06-15 NOTE — Discharge Instructions (Signed)
Please go to the ED to assess you for syncope. Also you might need a CT scan due to history of head trauma  Laceration Care, Adult A laceration is a cut that goes through all layers of the skin. The cut goes into the tissue beneath the skin. HOME CARE For stitches (sutures) or staples:  Keep the cut clean and dry.  If you have a bandage (dressing), change it at least once a day. Change the bandage if it gets wet or dirty, or as told by your doctor.  Wash the cut with soap and water 2 times a day. Rinse the cut with water. Pat it dry with a clean towel.  Put a thin layer of medicated cream on the cut as told by your doctor.  You may shower after the first 24 hours. Do not soak the cut in water until the stitches are removed.  Only take medicines as told by your doctor.  Have your stitches or staples removed as told by your doctor. For skin adhesive strips:  Keep the cut clean and dry.  Do not get the strips wet. You may take a bath, but be careful to keep the cut dry.  If the cut gets wet, pat it dry with a clean towel.  The strips will fall off on their own. Do not remove the strips that are still stuck to the cut. For wound glue:  You may shower or take baths. Do not soak or scrub the cut. Do not swim. Avoid heavy sweating until the glue falls off on its own. After a shower or bath, pat the cut dry with a clean towel.  Do not put medicine on your cut until the glue falls off.  If you have a bandage, do not put tape over the glue.  Avoid lots of sunlight or tanning lamps until the glue falls off. Put sunscreen on the cut for the first year to reduce your scar.  The glue will fall off on its own. Do not pick at the glue. You may need a tetanus shot if:  You cannot remember when you had your last tetanus shot.  You have never had a tetanus shot. If you need a tetanus shot and you choose not to have one, you may get tetanus. Sickness from tetanus can be serious. GET HELP  RIGHT AWAY IF:   Your pain does not get better with medicine.  Your arm, hand, leg, or foot loses feeling (numbness) or changes color.  Your cut is bleeding.  Your joint feels weak, or you cannot use your joint.  You have painful lumps on your body.  Your cut is red, puffy (swollen), or painful.  You have a red line on the skin near the cut.  You have yellowish-white fluid (pus) coming from the cut.  You have a fever.  You have a bad smell coming from the cut or bandage.  Your cut breaks open before or after stitches are removed.  You notice something coming out of the cut, such as wood or glass.  You cannot move a finger or toe. MAKE SURE YOU:   Understand these instructions.  Will watch your condition.  Will get help right away if you are not doing well or get worse. Document Released: 03/23/2008 Document Revised: 12/28/2011 Document Reviewed: 03/31/2011 [facility] Patient Information 2015 [place], [place]. This information is not intended to replace advice given to you by your health care provider. Make sure you discuss any questions you have  with your health care provider. ° °

## 2015-06-20 ENCOUNTER — Ambulatory Visit (INDEPENDENT_AMBULATORY_CARE_PROVIDER_SITE_OTHER): Payer: Medicare Other | Admitting: Internal Medicine

## 2015-06-20 ENCOUNTER — Encounter: Payer: Self-pay | Admitting: Internal Medicine

## 2015-06-20 VITALS — BP 109/71 | HR 87 | Temp 97.9°F | Ht 67.0 in | Wt 153.9 lb

## 2015-06-20 DIAGNOSIS — R42 Dizziness and giddiness: Secondary | ICD-10-CM

## 2015-06-20 DIAGNOSIS — G919 Hydrocephalus, unspecified: Secondary | ICD-10-CM

## 2015-06-20 DIAGNOSIS — I890 Lymphedema, not elsewhere classified: Secondary | ICD-10-CM | POA: Diagnosis not present

## 2015-06-20 DIAGNOSIS — Z0000 Encounter for general adult medical examination without abnormal findings: Secondary | ICD-10-CM

## 2015-06-20 NOTE — Patient Instructions (Addendum)
Alison Fox,  Please keep taking your nortriptyline as prescribed by your neurologist.  Your next appointment to neurologist [name] is on July 22, 2015. Please take precaution while getting up from seated position and do not try to get up in a hurry.  We will try to get you set up with another lymphedema clinic in [place]. The clinic will call you regarding this. Come back on Monday to get staples removed. Next visit in 3-4 months.   [name] MD MPH 06/20/2015 10:31 AM [facility] [address] [place], [place] [address]. Ph: [phone] Hours: 8 am - 5 pm

## 2015-06-20 NOTE — Progress Notes (Signed)
   Subjective:    Patient ID: Alison Fox, female    DOB: [birth year], 44 y.o.   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 She returns for an ED follow up visit.   S/p fall: She was in the ED on 06/15/2015 after sustaining a fall and laceration due to lightheadedness. She endorses that she did not pass out. She had an EKG within normal limits with NSR in the ED. She did not have any neurological abnormalities. She was treated with 4 staples to the laceration and pain control medications in the ED. She has had this complaint of lightheadedness or dizziness in the past as well, however has never sustained a fall due to it. Her head feels sore, she does not have a headache at present.  Hydrocephalus, chronic with mass effect on medula and platybasia - The patient had complained about headaches often, which prompted me to perform a CT head that revealed chronic hydrocephalus in Novemeebr 2015. She has since met with neurosurgery and neurology specialists who she is regularly following up with. Currently she is on nortryptiline 50 mg for headaches which she does have on an off and on basis.  Lymphedema  She continues to have lymphedema in left leg in its entirety. She uses compression stockings which help control the swelling. She has been treated at the [facility] with little success. I discussed another clinic in [place] and she thinks she would like to try it.   She has no other complaints this visit.   Review of Systems  Constitutional: Negative for fever and fatigue.  Respiratory: Negative.   Cardiovascular: Negative.   Neurological: Positive for dizziness, light-headedness and headaches. Negative for tremors, seizures, syncope, facial asymmetry, speech difficulty, weakness and numbness.   Objective:   Physical Exam  Constitutional: She is oriented to person, place,  and time. She appears well-developed and well-nourished. No distress.  HENT:  Head: Macrocephalic.  Nose: Nose normal.  Mouth/Throat: Oropharynx is clear and moist. No oropharyngeal exudate.  Laceration left back of head with 4 staples  Eyes: Conjunctivae and EOM are normal. Pupils are equal, round, and reactive to light. Right eye exhibits no discharge. Left eye exhibits no discharge.  Neck: Normal range of motion. Neck supple.  Cardiovascular: Normal rate, regular rhythm and normal heart sounds.   No murmur heard. Orthostatics:  Lying 111/68, 92 Sitting 117/72, 92 Standing 116/72, 102  Pulmonary/Chest: Effort normal and breath sounds normal. No respiratory distress. She has no wheezes. She has no rales.  Abdominal: Soft.  Musculoskeletal: She exhibits edema (left leg, non-pitting).  Lymphedema - left leg  - leg examined without compression stockings. Slight area of discoloration on both legs (lower 1/3rd shin), no open wounds.   Neurological: She is alert and oriented to person, place, and time. No cranial nerve deficit. She exhibits normal muscle tone. Coordination normal.  Skin: Skin is warm.  Psychiatric: She has a normal mood and affect.      Assessment & Plan:  Please see problem based charting.

## 2015-06-21 ENCOUNTER — Telehealth: Payer: Self-pay | Admitting: *Deleted

## 2015-06-21 NOTE — Addendum Note (Signed)
Addended by: Angelina Ok F on: 06/21/2015 09:08 AM   Modules accepted: Orders

## 2015-06-21 NOTE — Assessment & Plan Note (Signed)
Discussed as above in hydrocephalus.  Patient is orthostatic negative, no abnormality cardiac or neurological.  Will come back for staple removal on Tuesday

## 2015-06-21 NOTE — Telephone Encounter (Signed)
Call to [facility] to schedule appointment for patient in the Lymphedema Clinic.  Patient is scheduled for 07/01/2015 at 1:00 PM at [facility]  Rehabilitation  Department located at [facility] in [place].  Patient was called and informed of the appointment time and place.  Stated understanding of and will have mother take her.  Angelina Ok, RN 06/21/2015 9:00 AM.

## 2015-06-21 NOTE — Assessment & Plan Note (Signed)
Will get a flu shot next visit when she comes back on Tuesday for staple removal.

## 2015-06-21 NOTE — Assessment & Plan Note (Signed)
Will try to refer her to Lymphedema Clinic in [place].

## 2015-06-21 NOTE — Assessment & Plan Note (Addendum)
Patient is following this up with neurology and neurosurgery.  I am unsure if dizziness and lightheadedness is a part of the brain pathology, however there seem to no other reasons apparent at this time. She is orthostatics negative, and has no cardiac arrhythmias or neurological abnormalities. She denies symptoms specific to BPPV/hearing issues/vertigo. She has had lightheadedness as a chronic problem which correlates to the chronicity of her brain pathology. Advised the patient to take general precautions while standing up from seatead position.

## 2015-06-25 ENCOUNTER — Ambulatory Visit (INDEPENDENT_AMBULATORY_CARE_PROVIDER_SITE_OTHER): Payer: Medicare Other | Admitting: Internal Medicine

## 2015-06-25 ENCOUNTER — Encounter: Payer: Self-pay | Admitting: Internal Medicine

## 2015-06-25 VITALS — BP 115/59 | HR 87 | Temp 98.3°F | Ht 67.0 in | Wt 153.8 lb

## 2015-06-25 DIAGNOSIS — S0101XD Laceration without foreign body of scalp, subsequent encounter: Secondary | ICD-10-CM

## 2015-06-25 DIAGNOSIS — W19XXXD Unspecified fall, subsequent encounter: Secondary | ICD-10-CM | POA: Diagnosis not present

## 2015-06-25 DIAGNOSIS — Z4802 Encounter for removal of sutures: Secondary | ICD-10-CM | POA: Diagnosis not present

## 2015-06-25 DIAGNOSIS — Y9222 Religious institution as the place of occurrence of the external cause: Secondary | ICD-10-CM | POA: Diagnosis not present

## 2015-06-25 NOTE — Patient Instructions (Signed)
You have an appointment with the Lymphedema clinic on 9/12 at 1:00pm. We will see you in 3 months at our clinic. Have a great day!

## 2015-06-25 NOTE — Progress Notes (Signed)
Internal Medicine Clinic Attending  Case discussed with Dr. Truong at the time of the visit.  We reviewed the resident's history and exam and pertinent patient test results.  I agree with the assessment, diagnosis, and plan of care documented in the resident's note.  

## 2015-06-25 NOTE — Telephone Encounter (Signed)
Thanks

## 2015-06-25 NOTE — Assessment & Plan Note (Signed)
Pt had a mechanical fall on 8/27 while in church. She was seen in the ED for this and received 4 scalp staples. She is here today in clinic for removal of staples which was done w/o any complications. Scalp laceration well healed.   - RTC in 3 months

## 2015-06-25 NOTE — Progress Notes (Signed)
   Subjective:    Patient ID: Alison Fox, female    DOB: 07-07-1971, 44 y.o.   MRN: [id number]  HPI 44 y/o F w/ PMHx of hydrocephalus, tension HAs, lymphedema, and glaucoma who presents to clinic for staple removal. Please see problem list for further details.      Review of Systems  Respiratory: Negative for shortness of breath.   Cardiovascular: Negative for chest pain.  Musculoskeletal: Negative for gait problem.  Neurological: Positive for light-headedness (in the morning when first getting up).       Objective:   Physical Exam  Constitutional: She appears well-developed and well-nourished. No distress.  HENT:  Scalp laceration healing well, mildly tender to palpation, hydrocephalus  Cardiovascular: Normal rate, regular rhythm and normal heart sounds.   No murmur heard. Pulmonary/Chest: Effort normal and breath sounds normal. No respiratory distress. She has no wheezes. She has no rales.  Abdominal: Soft. Bowel sounds are normal. She exhibits no distension. There is no tenderness. There is no rebound.  Musculoskeletal:  Wearing ted hose on left leg   Neurological: She is alert.  Skin: Skin is warm and dry.          Assessment & Plan:  Please see problem based assessment and plan.

## 2015-07-01 ENCOUNTER — Ambulatory Visit: Payer: Medicare Other | Admitting: Occupational Therapy

## 2015-07-02 ENCOUNTER — Other Ambulatory Visit: Payer: Self-pay

## 2015-07-03 ENCOUNTER — Encounter: Payer: Self-pay | Admitting: Occupational Therapy

## 2015-07-03 ENCOUNTER — Ambulatory Visit: Payer: Medicare Other | Attending: Internal Medicine | Admitting: Occupational Therapy

## 2015-07-03 VITALS — Ht 67.0 in | Wt 156.8 lb

## 2015-07-03 DIAGNOSIS — I890 Lymphedema, not elsewhere classified: Secondary | ICD-10-CM | POA: Diagnosis not present

## 2015-07-03 NOTE — Therapy (Signed)
[facility] MAIN [facility] SERVICES [address] [place], [place], [address] Phone: [phone]   Fax:  [phone]  Occupational Therapy Evaluation  Patient Details  Name: Alison Fox MRN: [id number] Date of Birth: 1971/03/01 Referring Provider:  [name], MD  Encounter Date: 07/03/2015      OT End of Session - 07/03/15 1516    Visit Number 1   Number of Visits 36   Date for OT Re-Evaluation 10/01/15   OT Start Time 0900   OT Stop Time 1029   OT Time Calculation (min) 89 min   Activity Tolerance Patient tolerated treatment well;No increased pain   Behavior During Therapy [facility] for tasks assessed/performed      Past Medical History  Diagnosis Date  . Lymphedema     Chronic following left knee surgery in 2000.  Marland Kitchen Headache     Past Surgical History  Procedure Laterality Date  . Knee arthroscopy  2000    Left knee.     Filed Vitals:   07/03/15 0912  Height: 5\' 7"  (1.702 m)  Weight: 156 lb 12.8 oz (71.124 kg)    Visit Diagnosis:  Lymphedema - Plan: Ot plan of care cert/re-cert      Subjective Assessment - 07/03/15 0920    Subjective  Alison Fox is a 44 yo old female referred by her PCP, Alison Gula, MD, for evaluation and treatment of BLE lymphedema (LE) w initial onset in her early 72's. Pt reports marked exacerbation s/p arthroscopic knee surgery sometime around the year 2000. Pt reports she previously completed Complete Decongestive Therapy (CDT) at [facility] outpatient rehab with some success. She endorses less successful subsequent  LE therapy in [place], which included sessions in LE pump. Pt states her custom, flat knit thigh high compression garment worn daily  is several years old and is worn out.. She abandoned her Alison Fox Classic for HOS b/c "it is too bulky and "irritable.".   Patient is accompained by: Family member   Pertinent History onset in 76s and positive family history for maternal  grandmother and cousin suggests primary LE Tarda; arthroscopic knee sx ~ 2000?, recent fall w/ head laceration. 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 Yes   Pain Score 5    Pain Location Leg   Pain Orientation Left   Pain Descriptors / Indicators Burning;Aching;Constant;Heaviness;Numbness;Tightness;Tingling   Pain Type Chronic pain   Pain Onset More than a month ago   Pain Frequency Constant   Aggravating Factors  standing, walking   Effect of Pain on Daily Activities limts ability to perform work activities requiring standing, limits walking; limits balance- recent fall w/ head laceration             LYMPHEDEMA/ONCOLOGY QUESTIONNAIRE - 07/03/15 1632    What other symptoms do you have   Are you Having Heaviness or Tightness Yes   Are you having pitting edema Yes   Body Site dorsal feet   Is it Hard or Difficult finding clothes that fit Yes   Do you have infections Yes   Comments also + hx lymphorrhea   Is there Decreased scar mobility Yes   Stemmer Sign Yes   Other Symptoms RLE dry and scaley w/ moderately swollen foot, pitting and hemociderin staining ankle to  mid leg anteriorly. LLE severly swollen w/ 4+  pitting at dorsal foot. Skin is dry and flaking w/ scar from previous injury where Pt endorses intermittent lymphorrhea. Skin densly fibrotic below knee, mottled, and mildly reddened. Swelling extends from toes to groin.   Lymphedema Stage   Stage STAGE 2 SPONTANEOUSLY IRREVERSIBLE   Lymphedema Assessments   Lymphedema Assessments Lower extremities   Right Lower Extremity Lymphedema   Other RLE limb volume ankle to groin (A-G)=7488.38 ml   Left Lower Extremity Lymphedema   Other LLE limb volume 10,741.73 ml   Other limb volume differential= 30.28% L>R                        OT Education - 07/03/15 1234    Education provided Yes   Education Details Provided Pt/caregiver skilled education and ADL training throughout visit for lymphedema etiology, progression, and treatment including Intensive and Management Phase Complete Decongestive Therapy (CDT)  Discussed lymphedema precautions, cellulitis risk, and all CDT components, including compression wrapping, compression garmentsdevices, lymphatic pumping ther ex, simple self-MLD, and skin care.    Person(s) Educated Patient;Parent(s)   Methods Explanation;Demonstration;Tactile cues;Verbal cues;Handout   Comprehension Verbal cues required;Tactile cues required;Need further instruction;Verbalized understanding             OT Long Term Goals - 07/03/15 1626    OT LONG TERM GOAL #1   Title Pt able to correctly apply gradient compression wraps to below knee with moderate caregiver assistance for optimal limb volume reduction and improvement in tissue integrity.   Baseline dependent    Time 2   Period Weeks   Status New   OT LONG TERM GOAL #2   Title Pt to achieve 15% limb volume reduction in LLE, and 10% reduction in RLE by DC to limit lymphedema (LE) progression and infection risk.   Baseline dependent   Time 12   Period Weeks   Status New   OT LONG TERM GOAL #3   Title Pt > 75% compliant with all daily LE self-care protocols w/ moderate caregiver assistance, including simple self-manual lymphatic drainage (MLD), skin care, lymphatic pumping therex, and donning/ doffing progression garments.   Baseline dependent   Time 12   Period Weeks   Status New   OT LONG TERM GOAL #4   Baseline dependent   Time 12   Period Weeks   Status New   OT LONG TERM GOAL #5   Title Pt to remain infection free throughout CDT course to limit infection and LE progression.   Baseline dependent   Time 12   Period Weeks   Status New   Long Term Additional Goals   Additional Long Term Goals  Yes   OT LONG TERM GOAL #6   Title During Management Phase CDT Pt to sustain limb volume reductions achieved during Intensive Phase CDT within 5% utilizing LE self-care protocols, appropriate compression garments/ devices, and moderate caregiver assistance.   Baseline dependent   Time 6   Period Months   Status New               Plan - 07/03/15 1235    Clinical Impression Statement Pt presents with moderate -sever, stage II,  LLE lymphedema (LE) consistent with primary Lymphedema Tarda as evidenced by early adult onset and positive family hx. LLE swelling initially started in Pt's early 20s without a known precipitating event, and then markedly exacerbated s/p arthroscopic knee surgery around the year 2000 by report. Chronic  venous insufficiency also appears to be a contributing factor. RLE presents with mild, stage II lymphedema with brawny edema and dark hemociderin staining. BLE swelling and pain currently limits standing and walking tolerance needed for basic and instrumental ADL performance, productive activities at work, and participation in community and social activities. Body asymmetry 2/2 LE has contributed to hx of infection and to hx of falls and loss of balance. Without skilled Occupational Therapy for Complete Decongestive Therapy (CDT) to address chronic, progressive BLE LE, this condition will worsen and further functional decline is expected.   Pt will benefit from skilled therapeutic intervention in order to improve on the following deficits (Retired) Difficulty walking;Impaired flexibility;Increased edema;Decreased knowledge of precautions;Decreased skin integrity;Pain;Decreased balance;Decreased knowledge of use of DME;Decreased scar mobility;Decreased mobility   Rehab Potential Good   Clinical Impairments Affecting Rehab Potential decreased balance, difficulty walkingL decreased L knee akle and foot range of motion 2/2 girth and decreased tissue flexibility at joints, falls  risk, infection risk   OT Frequency 3x / week   OT Duration 12 weeks   OT Treatment/Interventions Self-care/ADL training;DME and/or AE instruction;Manual lymph drainage;Patient/family education;Compression bandaging;Therapeutic exercises;Scar mobilization;Manual Therapy   Plan Intensive Phase CDT will commence on LLE first w/ emphasis on patient and caregiver education for all lymphedema self-care protocols. Once custom, flat knit compression stocking and toe cap are fitted and assessed on the decongested LLE, CDT to RLE will commence.  Pt will be fitted les complex RLE knee high compression garment, and BLE convoluted foam devices designed to limit further fibrosis formation and to facilitate improved lymphatic decongestion during HOS. Pt will be encouraged to follow along with OT PRN during Management Phase to facilitate successful, ongoing LE self-care.   Consulted and Agree with Plan of Care Patient;Family member/caregiver          G-Codes - 07/04/15 1304    Functional Assessment Tool Used anatomical and volumetric measurements, clinical judgement, interview, medical hx   Functional Limitation Self care   Self Care Current Status (X9147) At least 80 percent but less than 100 percent impaired, limited or restricted   Self Care Goal Status (W2956) At least 20 percent but less than 40 percent impaired, limited or restricted      Problem List Patient Active Problem List   Diagnosis Date Noted  . Visit for suture removal 06/25/2015  . Chronic tension-type headache, intractable 04/05/2015  . Hydrocephalus, adult 09/10/2014  . Glaucoma 08/22/2014  . Vision decreased 08/22/2014  . Frontal headache 08/21/2014  . Right leg swelling 02/27/2014  . Chest pain on respiration 02/27/2014  . Postural dizziness 04/26/2013  . Fatigue 04/26/2013  . Abnormality of gait 01/25/2013  . Preventative health care 05/18/2011  . Lymphedema 08/06/2006   Loel Dubonnet, MS, OTR/L, CLT-LANA 04-Jul-2015 4:40  PM  Judithann Sauger 2015-07-04, 4:40 PM  [place] MAIN [facility] SERVICES [address] [place], [place], [address] Phone: [phone]   Fax:  [phone]

## 2015-07-03 NOTE — Patient Instructions (Signed)

## 2015-07-05 ENCOUNTER — Other Ambulatory Visit: Payer: Self-pay

## 2015-07-05 MED ORDER — NORTRIPTYLINE HCL 50 MG PO CAPS: 50.0000 mg | ORAL_CAPSULE | Freq: Every day | ORAL | Status: DC

## 2015-07-22 ENCOUNTER — Ambulatory Visit (INDEPENDENT_AMBULATORY_CARE_PROVIDER_SITE_OTHER): Payer: Medicare Other | Admitting: Neurology

## 2015-07-22 ENCOUNTER — Encounter: Payer: Self-pay | Admitting: Neurology

## 2015-07-22 ENCOUNTER — Ambulatory Visit: Payer: Medicare Other | Attending: Internal Medicine | Admitting: Occupational Therapy

## 2015-07-22 ENCOUNTER — Encounter: Payer: Self-pay | Admitting: *Deleted

## 2015-07-22 VITALS — BP 100/60 | HR 80 | Ht 67.0 in | Wt 155.0 lb

## 2015-07-22 DIAGNOSIS — G919 Hydrocephalus, unspecified: Secondary | ICD-10-CM | POA: Diagnosis not present

## 2015-07-22 DIAGNOSIS — I890 Lymphedema, not elsewhere classified: Secondary | ICD-10-CM | POA: Diagnosis not present

## 2015-07-22 DIAGNOSIS — G44219 Episodic tension-type headache, not intractable: Secondary | ICD-10-CM | POA: Diagnosis not present

## 2015-07-22 DIAGNOSIS — R42 Dizziness and giddiness: Secondary | ICD-10-CM | POA: Diagnosis not present

## 2015-07-22 NOTE — Therapy (Signed)
[facility] MAIN [facility] SERVICES [address] [place], [place], [address] Phone: [phone]   Fax:  [phone]  Occupational Therapy Treatment  Patient Details  Name: Alison Fox MRN: [id number] Date of Birth: [birth year] Referring Provider:  [name], MD  Encounter Date: 07/22/2015      OT End of Session - 07/22/15 1212    Visit Number 2   Number of Visits 36   Date for OT Re-Evaluation 10/01/15   OT Start Time 1104   OT Stop Time 1206   OT Time Calculation (min) 62 min   Activity Tolerance Patient tolerated treatment well;No increased pain   Behavior During Therapy [facility] for tasks assessed/performed      Past Medical History  Diagnosis Date  . Lymphedema     Chronic following left knee surgery in 2000.  Marland Kitchen Headache     Past Surgical History  Procedure Laterality Date  . Knee arthroscopy  2000    Left knee.     There were no vitals filed for this visit.  Visit Diagnosis:  Lymphedema      Subjective Assessment - 07/22/15 1156    Subjective  Alison Fox ipresents for visit 2 for CDT to BLE- emphasis on the LLE.Pt is accompanied by her mom. Pt tells me that she has never had MLD before. "They used a machine where I went before."   Patient is accompained by: Family member   Pertinent History onset in 13s and positive family history for maternal grandmother and cousin suggests primary LE Tarda; arthroscopic knee sx ~ 2000?, recent fall w/ head laceration. 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 No/denies   Pain Onset More than a month ago                      OT Treatments/Exercises (OP) - 07/22/15 0001    ADLs   ADL Comments see Pt EDU   ADL Education Given Yes   Manual Therapy   Manual Therapy Edema  management;Manual Lymphatic Drainage (MLD);Soft tissue mobilization;Compression Bandaging;Other (comment)  skin care BLE w/ low pH Eucerin lotion   Soft tissue mobilization fibrosis technique at L ankle   Manual Lymphatic Drainage (MLD) Manual lymph drainage (MLD) to LLE in supine utilizing functional inguinal lymph nodes and deep abdominal lymphatics as is customary for non-cancer related lower extremity LE, including bilateral "short neck" sequence, J strokes to sub and supraclavicular LN, deep abdominal pathways, functional inguinal LN, lower extremity proximal to distal w/ emphasis on medial knee bottleneck and politeal LN. Performed fibrosis technique to B maleoli and distal  legs to address fatty fibrosis. Good tolerance.   Compression Bandaging LLE gradient compression wraps applied circumferentially in gradient configuration from toes to groin as follows: toe wrap x1 under cotton stockinett; 8 cm x 1 to foot and ankle, 10 cm x 2, then  12 cm x 2- all layered over .04 x 10 cm and 12 cm Rosidol Soft foam from A-G.   Other Manual Therapy skin care w/ low pH Eucerin lotion to BLE to improve hydration and reduce infection risk.                OT Education - 07/22/15 1211    Education provided Yes   Education Details  Provided Pt/caregiver skilled education and ADL training throughout visit for lymphedema etiology, progression, and treatment including Intensive and Management Phase Complete Decongestive Therapy (CDT) . Emphasis today in introduction to skin care, simple self- MLD, ther ex  and compression wrapping   Person(s) Educated Patient;Parent(s)   Methods Demonstration;Explanation;Tactile cues;Verbal cues;Handout   Comprehension Verbalized understanding;Verbal cues required;Tactile cues required;Returned demonstration;Need further instruction             OT Long Term Goals - 07/03/15 1626    OT LONG TERM GOAL #1   Title Pt able to correctly apply gradient compression wraps to  below knee with moderate caregiver assistance for optimal limb volume reduction and improvement in tissue integrity.   Baseline dependent    Time 2   Period Weeks   Status New   OT LONG TERM GOAL #2   Title Pt to achieve 15% limb volume reduction in LLE, and 10% reduction in RLE by DC to limit lymphedema (LE) progression and infection risk.   Baseline dependent   Time 12   Period Weeks   Status New   OT LONG TERM GOAL #3   Title Pt > 75% compliant with all daily LE self-care protocols w/ moderate caregiver assistance, including simple self-manual lymphatic drainage (MLD), skin care, lymphatic pumping therex, and donning/ doffing progression garments.   Baseline dependent   Time 12   Period Weeks   Status New   OT LONG TERM GOAL #4   Baseline dependent   Time 12   Period Weeks   Status New   OT LONG TERM GOAL #5   Title Pt to remain infection free throughout CDT course to limit infection and LE progression.   Baseline dependent   Time 12   Period Weeks   Status New   Long Term Additional Goals   Additional Long Term Goals Yes   OT LONG TERM GOAL #6   Title During Management Phase CDT Pt to sustain limb volume reductions achieved during Intensive Phase CDT within 5% utilizing LE self-care protocols, appropriate compression garments/ devices, and moderate caregiver assistance.   Baseline dependent   Time 6   Period Months   Status New               Plan - 07/22/15 1215    Clinical Impression Statement Pt's condition is unchanged since last seen for OT evaluation. Skin is dry, cracked and reddened below knees,R>L, with moderate LLE swelling toes to groin. Pt tolerated into level MLD today and was able to aply lotion as directed w/ cues. Pt was able to tolerate compression wraps in clinic and  teach back wear and care instructions by end of session. Pt performed LE ther ex with demonstration and verbal cuews.   Pt will benefit from skilled therapeutic intervention in order  to improve on the following deficits (Retired) Difficulty walking;Impaired flexibility;Increased edema;Decreased knowledge of precautions;Decreased skin integrity;Pain;Decreased balance;Decreased knowledge of use of DME;Decreased scar mobility;Decreased mobility   Rehab Potential Good   Clinical Impairments Affecting Rehab Potential decreased balance, difficulty walkingL decreased L knee akle and foot range of motion 2/2 girth and decreased tissue flexibility at joints, falls risk, infection risk   OT Frequency 3x / week   OT Duration 12 weeks   OT Treatment/Interventions Self-care/ADL training;DME and/or AE instruction;Manual lymph drainage;Patient/family education;Compression bandaging;Therapeutic exercises;Scar mobilization;Manual Therapy   Consulted and Agree with Plan of Care Patient;Family member/caregiver        Problem List Patient Active Problem List   Diagnosis  Date Noted  . Episodic tension-type headache, not intractable 07/22/2015  . Visit for suture removal 06/25/2015  . Hydrocephalus, adult 09/10/2014  . Glaucoma 08/22/2014  . Vision decreased 08/22/2014  . Frontal headache 08/21/2014  . Right leg swelling 02/27/2014  . Chest pain on respiration 02/27/2014  . Postural dizziness 04/26/2013  . Fatigue 04/26/2013  . Abnormality of gait 01/25/2013  . Preventative health care 05/18/2011  . Lymphedema 08/06/2006   Loel Dubonnet, MS, OTR/L, [facility] 07/22/2015 12:19 PM  [facility] MAIN [facility] SERVICES [address] [place], [place], [address] Phone: [phone]   Fax:  [phone]

## 2015-07-22 NOTE — Patient Instructions (Addendum)
Continue nortriptyline daily Follow up in 6 months.

## 2015-07-22 NOTE — Progress Notes (Addendum)
NEUROLOGY FOLLOW UP OFFICE NOTE  Alison Fox [id number]  HISTORY OF PRESENT ILLNESS: Alison Fox is a 44 year old right-handed woman with glaucoma and lymphedema who follows up for chronic daily headache and hydrocephalus.  She is accompanied by her mother who provides some history.  History obtained from ED note in August as well.  Labs reviewed.  UPDATE: Intensity:  8-9/10 Duration:  One hour Frequency:  Once a week Abortive therapy:  Advil, Tylenol Preventative therapy:  nortriptyline   She had a fall in August due to lightheadedness.  She sustained a laceration on the back of her head, requiring sutures.  BMP unremarkable.  Cardiac workup and orthostatics have been negative.  ADDENDUM:  She did see Dr. Conchita Paris at [facility].  She had a repeat MRI of the brain without contrast which demonstrated symmetric ventriculomegaly of the lateral ventricles and third ventricle without significant transependymal flow.  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  Endoscopic third ventriculostomy was offered but not insisted since her only symptoms are headache, which is now mild.  She did not wish to proceed with this procedure and plan is to repeat MRI of brain one more time in 6 months.  HISTORY: Headaches started in October but she says it started 3 months ago.  The headaches are bi-frontal, non-throbbing sharp pain, and 8.5-9/10 intensity.  They last all day and occurs daily.  They are associated with dizziness but not nausea.  She was taking Advil or Tylenol daily.  She denies preceding trauma or precipitating factor.  She has history of occasional mild headache, but nothing like this.  She reportedly has longstanding history of glaucoma since childhood, amblyopia of the left eye.  As per her exam from 08/07/14 by her ophthalmologist, Dr. Ames Coupe, fundi are unremarkable.    A CT of the head performed on 09/10/14 showed symmetric  bilateral ventriculomegaly, enlargement of the third ventricle, minimal enlargement of the fourth ventricle and poorly visualized cerebral acqueduct.  Thinning of the overlying cortex suggests this is chronic hydrocephalus.  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  She was referred to neurosurgery, whom she saw on 01/21/15.  MRI of the brain was repeated.  PAST MEDICAL HISTORY: Past Medical History  Diagnosis Date  . Lymphedema     Chronic following left knee surgery in 2000.  Marland Kitchen Headache     MEDICATIONS: Current Outpatient Prescriptions on File Prior to Visit  Medication Sig Dispense Refill  . acetaminophen (TYLENOL) 325 MG tablet Take 650 mg by mouth every 6 (six) hours as needed for pain.    . Elastic Bandages & Supports (JOBST KNEE HIGH COMPRESSION SM) MISC Wear supports throughout the day. 2 each 0  . nortriptyline (PAMELOR) 50 MG capsule Take 1 capsule (50 mg total) by mouth at bedtime. 60 capsule 2   No current facility-administered medications on file prior to visit.    ALLERGIES: No Known Allergies  FAMILY HISTORY: Family History  Problem Relation Age of Onset  . Heart disease Maternal Aunt   . Heart disease Maternal Uncle   . Stroke Paternal Uncle   . Cancer Paternal Uncle     Melanoma  . Diabetes Maternal Grandmother   . Diabetes Paternal Grandfather     SOCIAL HISTORY: Social History   Social History  . Marital Status: Single    Spouse Name: N/A  . Number of Children: N/A  .  Years of Education: N/A   Occupational History  . Not on file.   Social History Main Topics  . Smoking status: Never Smoker   . Smokeless tobacco: Never Used  . Alcohol Use: No  . Drug Use: No  . Sexual Activity: No   Other Topics Concern  . Not on file   Social History Narrative    REVIEW OF SYSTEMS: Constitutional: No fevers, chills, or sweats, no generalized fatigue,  change in appetite Eyes: No visual changes, double vision, eye pain Ear, nose and throat: No hearing loss, ear pain, nasal congestion, sore throat Cardiovascular: No chest pain, palpitations Respiratory:  No shortness of breath at rest or with exertion, wheezes GastrointestinaI: No nausea, vomiting, diarrhea, abdominal pain, fecal incontinence Genitourinary:  No dysuria, urinary retention or frequency Musculoskeletal:  No neck pain, back pain Integumentary: No rash, pruritus, skin lesions Neurological: as above Psychiatric: No depression, insomnia, anxiety Endocrine: No palpitations, fatigue, diaphoresis, mood swings, change in appetite, change in weight, increased thirst Hematologic/Lymphatic:  No anemia, purpura, petechiae. Allergic/Immunologic: no itchy/runny eyes, nasal congestion, recent allergic reactions, rashes  PHYSICAL EXAM: Filed Vitals:   07/22/15 0747  BP: 100/60  Pulse: 80   General: No acute distress.  Head:  Normocephalic/atraumatic Eyes:  Fundoscopic exam unremarkable without vessel changes, exudates, hemorrhages or papilledema. Neck: supple, no paraspinal tenderness, full range of motion Heart:  Regular rate and rhythm Lungs:  Clear to auscultation bilaterally Back: No paraspinal tenderness Neurological Exam: alert and oriented to person, place, and time. Attention span and concentration intact, recent and remote memory intact, fund of knowledge intact.  Speech fluent and not dysarthric, language intact.  Left exotropia.  Otherwise, CN II-XII intact. Bulk and tone normal, muscle strength 5/5 throughout.  Sensation to light touch, temperature and vibration intact.  Deep tendon reflexes 2+ throughout, toes downgoing.  Finger to nose and heel to shin testing intact.  Gait normal, Romberg negative.  IMPRESSION: Tension-type headaches, improved Dizziness Hydrocephalus  PLAN: Continue nortriptyline Advil or Tylenol as needed She reportedly saw neurosurgery in April,  but I never received the notes.  Will request again Follow up in 6 months.  Shon Millet, DO

## 2015-07-22 NOTE — Patient Instructions (Signed)

## 2015-07-24 ENCOUNTER — Ambulatory Visit: Payer: Medicare Other | Admitting: Occupational Therapy

## 2015-07-24 DIAGNOSIS — I890 Lymphedema, not elsewhere classified: Secondary | ICD-10-CM | POA: Diagnosis not present

## 2015-07-24 NOTE — Patient Instructions (Signed)
LE instructions and precautions as established- see initial eval  Rewrap compression wraps each day for optimal effectiveness. Make sure skin is clean and dry, and apply lotion before re-wrapping.

## 2015-07-24 NOTE — Therapy (Signed)
[facility] MAIN [facility] SERVICES [address] [place], [place], [address] Phone: [phone]   Fax:  [phone]  Occupational Therapy Treatment  Patient Details  Name: Alison Fox MRN: [id number] Date of Birth: 02-15-1971 Referring Provider:  [name], MD  Encounter Date: 07/24/2015      OT End of Session - 07/24/15 1206    Visit Number 3   Number of Visits 36   Date for OT Re-Evaluation 10/01/15   OT Start Time 1105   OT Stop Time 1200   OT Time Calculation (min) 55 min   Activity Tolerance Patient tolerated treatment well;No increased pain   Behavior During Therapy [facility] for tasks assessed/performed      Past Medical History  Diagnosis Date  . Lymphedema     Chronic following left knee surgery in 2000.  Marland Kitchen Headache     Past Surgical History  Procedure Laterality Date  . Knee arthroscopy  2000    Left knee.     There were no vitals filed for this visit.  Visit Diagnosis:  Lymphedema      Subjective Assessment - 07/24/15 1108    Subjective  Alison Fox ipresents for visit 3 for CDT to BLE- emphasis on the LLE.Pt is accompanied by her mom. Pt prewsents w/ compression wraps in place since last visit. She c/o soreness at  medial and lateral foot w/ shoe on.   Patient is accompained by: Family member   Pertinent History onset in 53s and positive family history for maternal grandmother and cousin suggests primary LE Tarda; arthroscopic knee sx ~ 2000?, recent fall w/ head laceration. 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)   Limitations difficulty walking, decreased balance, suspected BLE suspected, R>L,     Patient Stated Goals get the swelling down and keep it down   Pain Score --  not rated   Pain Location Leg   Pain Orientation Left   Pain Descriptors / Indicators Burning;Sore;Other (Comment)  itching   Pain Onset More than a month ago   Pain Frequency Intermittent                       OT Treatments/Exercises (OP) - 07/24/15 0001    ADLs   ADL Education Given Yes   Manual Therapy   Manual Therapy Edema management;Compression Bandaging   Compression Bandaging LLE gradient compression wraps applied circumferentially in gradient configuration from toes to groin as follows: toe wrap x1 under cotton stockinett; 8 cm x 1 to foot and ankle, 10 cm x 2, then  12 cm x 2- all layered over .04 x 10 cm and 12 cm Rosidol Soft foam from A-G.   Other Manual Therapy skin care                OT Education - 07/24/15 1204    Education provided Yes   Education Details Emphasis today on caregiver training for gradient compression wrapping LLE toes to groin (A-G)  using proper technique. By end of session caregiver was able to apply wraps with min A and cues.   Person(s) Educated Patient;Parent(s)   Methods Explanation;Demonstration;Tactile cues;Verbal cues;Handout   Comprehension Verbalized understanding;Returned demonstration;Verbal cues required;Tactile cues required;Need further instruction             OT Long Term Goals - 07/03/15 1626    OT LONG TERM GOAL #1   Title Pt able to correctly apply gradient compression wraps to  below knee with moderate caregiver assistance for optimal limb volume reduction and improvement in tissue integrity.   Baseline dependent    Time 2   Period Weeks   Status New   OT LONG TERM GOAL #2   Title Pt to achieve 15% limb volume reduction in LLE, and 10% reduction in RLE by DC to limit lymphedema (LE) progression and infection risk.   Baseline dependent   Time 12   Period Weeks   Status New   OT LONG TERM GOAL #3   Title Pt > 75% compliant with all daily LE self-care protocols w/ moderate caregiver assistance, including simple self-manual lymphatic drainage (MLD), skin care, lymphatic pumping therex, and donning/ doffing progression garments.   Baseline dependent   Time 12   Period Weeks   Status New    OT LONG TERM GOAL #4   Baseline dependent   Time 12   Period Weeks   Status New   OT LONG TERM GOAL #5   Title Pt to remain infection free throughout CDT course to limit infection and LE progression.   Baseline dependent   Time 12   Period Weeks   Status New   Long Term Additional Goals   Additional Long Term Goals Yes   OT LONG TERM GOAL #6   Title During Management Phase CDT Pt to sustain limb volume reductions achieved during Intensive Phase CDT within 5% utilizing LE self-care protocols, appropriate compression garments/ devices, and moderate caregiver assistance.   Baseline dependent   Time 6   Period Months   Status New               Plan - 07/24/15 1206    Clinical Impression Statement Pt tolerated compression wraps during visit interval with minimal difficulty. Swelling is visibly reduced today. I encouaged Pt  to rewrap q 24 hours for optimal effectiveness and comfort. By end of session Pt's mother, Alison Fox, was able to apply wraps using proper gradient and tension with min A and cues.    Pt will benefit from skilled therapeutic intervention in order to improve on the following deficits (Retired) Difficulty walking;Impaired flexibility;Increased edema;Decreased knowledge of precautions;Decreased skin integrity;Pain;Decreased balance;Decreased knowledge of use of DME;Decreased scar mobility;Decreased mobility   Rehab Potential Good   Clinical Impairments Affecting Rehab Potential decreased balance, difficulty walkingL decreased L knee akle and foot range of motion 2/2 girth and decreased tissue flexibility at joints, falls risk, infection risk   OT Frequency 3x / week   OT Duration 12 weeks   OT Treatment/Interventions Self-care/ADL training;DME and/or AE instruction;Manual lymph drainage;Patient/family education;Compression bandaging;Therapeutic exercises;Scar mobilization;Manual Therapy   Consulted and Agree with Plan of Care Patient;Family member/caregiver         Problem List Patient Active Problem List   Diagnosis Date Noted  . Episodic tension-type headache, not intractable 07/22/2015  . Visit for suture removal 06/25/2015  . Hydrocephalus, adult 09/10/2014  . Glaucoma 08/22/2014  . Vision decreased 08/22/2014  . Frontal headache 08/21/2014  . Right leg swelling 02/27/2014  . Chest pain on respiration 02/27/2014  . Postural dizziness 04/26/2013  . Fatigue 04/26/2013  . Abnormality of gait 01/25/2013  . Preventative health care 05/18/2011  . Lymphedema 08/06/2006    Loel Dubonnet, MS, OTR/L, CLT-LANA 07/24/2015 12:10 PM   [facility] MAIN [facility] SERVICES [address] [place], [place], [address] Phone: [phone]   Fax:  [phone]

## 2015-07-26 ENCOUNTER — Ambulatory Visit: Payer: Medicare Other | Admitting: Occupational Therapy

## 2015-07-26 DIAGNOSIS — I890 Lymphedema, not elsewhere classified: Secondary | ICD-10-CM

## 2015-07-26 NOTE — Therapy (Signed)
[place] [facility] MAIN [facility] SERVICES [address] [place], [place], [address] Phone: [phone]   Fax:  [phone]  Occupational Therapy Treatment  Patient Details  Name: Alison Fox MRN: [id number] Date of Birth: Aug 29, 1971 Referring Provider:  [name], MD  Encounter Date: 07/26/2015      OT End of Session - 07/26/15 1226    Visit Number 4   Number of Visits 36   Date for OT Re-Evaluation 10/01/15   OT Start Time 1120   OT Stop Time 1223   OT Time Calculation (min) 63 min   Activity Tolerance Patient tolerated treatment well;No increased pain   Behavior During Therapy [facility] for tasks assessed/performed      Past Medical History  Diagnosis Date  . Lymphedema     Chronic following left knee surgery in 2000.  Marland Kitchen Headache     Past Surgical History  Procedure Laterality Date  . Knee arthroscopy  2000    Left knee.     There were no vitals filed for this visit.  Visit Diagnosis:  Lymphedema      Subjective Assessment - 07/26/15 1125    Subjective  Ms. Parzych ipresents for visit 4 for CDT to BLE- emphasis on the LLE.Pt is accompanied by her mom. Pt prewsents w/ compression wraps in place.   Patient is accompained by: Family member   Pertinent History onset in 34s and positive family history for maternal grandmother and cousin suggests primary LE Tarda; arthroscopic knee sx ~ 2000?, recent fall w/ head laceration. 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 Yes   Pain Score --  not rated   Pain Location Foot   Pain Orientation Left   Pain Descriptors / Indicators Sore   Pain Onset More than a month ago                      OT Treatments/Exercises (OP) - 07/26/15 0001    ADLs   ADL Education Given Yes   Manual  Therapy   Manual Therapy Edema management;Manual Lymphatic Drainage (MLD);Compression Bandaging;Other (comment)  skin care to LLE w/ low pH Eucerin lotion throughout MLD.   Soft tissue mobilization fibrosis technique at L ankle   Manual Lymphatic Drainage (MLD) Manual lymph drainage (MLD) to LLE in supine utilizing functional inguinal lymph nodes and deep abdominal lymphatics as is customary for non-cancer related lower extremity LE, including bilateral "short neck" sequence, J strokes to sub and supraclavicular LN, deep abdominal pathways, functional inguinal LN, lower extremity proximal to distal w/ emphasis on medial knee bottleneck and politeal LN. Performed fibrosis technique to B maleoli and distal  legs to address fatty fibrosis. Good tolerance.   Compression Bandaging LLE gradient compression wraps applied circumferentially in gradient configuration from toes to groin as follows: toe wrap x1 under cotton stockinett; 8 cm x 1 to foot and ankle, 10 cm x 2, then  12 cm x 2- all layered over .04 x 10 cm and 12 cm Rosidol Soft foam from A-G.   Other Manual Therapy skin care                OT Education - 07/26/15 1224    Education Details Emphasis on LE self-care training today on skin care to improve  hydration and mobilize scar,  compression wrap review and intro level simple self-MLD.   Person(s) Educated Patient;Parent(s)   Methods Explanation;Demonstration;Handout;Tactile cues;Verbal cues   Comprehension Verbalized understanding;Need further instruction;Returned demonstration;Verbal cues required;Tactile cues required             OT Long Term Goals - 07/03/15 1626    OT LONG TERM GOAL #1   Title Pt able to correctly apply gradient compression wraps to below knee with moderate caregiver assistance for optimal limb volume reduction and improvement in tissue integrity.   Baseline dependent    Time 2   Period Weeks   Status New   OT LONG TERM GOAL #2   Title Pt to achieve 15%  limb volume reduction in LLE, and 10% reduction in RLE by DC to limit lymphedema (LE) progression and infection risk.   Baseline dependent   Time 12   Period Weeks   Status New   OT LONG TERM GOAL #3   Title Pt > 75% compliant with all daily LE self-care protocols w/ moderate caregiver assistance, including simple self-manual lymphatic drainage (MLD), skin care, lymphatic pumping therex, and donning/ doffing progression garments.   Baseline dependent   Time 12   Period Weeks   Status New   OT LONG TERM GOAL #4   Baseline dependent   Time 12   Period Weeks   Status New   OT LONG TERM GOAL #5   Title Pt to remain infection free throughout CDT course to limit infection and LE progression.   Baseline dependent   Time 12   Period Weeks   Status New   Long Term Additional Goals   Additional Long Term Goals Yes   OT LONG TERM GOAL #6   Title During Management Phase CDT Pt to sustain limb volume reductions achieved during Intensive Phase CDT within 5% utilizing LE self-care protocols, appropriate compression garments/ devices, and moderate caregiver assistance.   Baseline dependent   Time 6   Period Months   Status New               Plan - 07/26/15 1227    Clinical Impression Statement Pt's mother did an excellent job of applying compression wraps as instructed last session. Pt tolerated wraps for 23 hrs/day between sessions. Limb volume is visibly decreased today and tissue density below knee is trace less dense. Pt encouraged to soak feet to trim nails this weekend and to hydrate anterior LLE scar before applying lotion and wraps.   Pt will benefit from skilled therapeutic intervention in order to improve on the following deficits (Retired) Difficulty walking;Impaired flexibility;Increased edema;Decreased knowledge of precautions;Decreased skin integrity;Pain;Decreased balance;Decreased knowledge of use of DME;Decreased scar mobility;Decreased mobility   Rehab Potential Good    Clinical Impairments Affecting Rehab Potential decreased balance, difficulty walkingL decreased L knee akle and foot range of motion 2/2 girth and decreased tissue flexibility at joints, falls risk, infection risk   OT Frequency 3x / week   OT Duration 12 weeks   OT Treatment/Interventions Self-care/ADL training;DME and/or AE instruction;Manual lymph drainage;Patient/family education;Compression bandaging;Therapeutic exercises;Scar mobilization;Manual Therapy   Consulted and Agree with Plan of Care Patient;Family member/caregiver        Problem List Patient Active Problem List   Diagnosis Date Noted  . Episodic tension-type headache, not intractable 07/22/2015  . Visit for suture removal 06/25/2015  . Hydrocephalus, adult 09/10/2014  . Glaucoma 08/22/2014  . Vision decreased 08/22/2014  . Frontal headache 08/21/2014  . Right leg swelling 02/27/2014  .  Chest pain on respiration 02/27/2014  . Postural dizziness 04/26/2013  . Fatigue 04/26/2013  . Abnormality of gait 01/25/2013  . Preventative health care 05/18/2011  . Lymphedema 08/06/2006    Loel Dubonnet, MS, OTR/L, [facility] 07/26/2015 12:30 PM   [place] [facility] MAIN [facility] SERVICES [address] [place], [place], [address] Phone: [phone]   Fax:  [phone]

## 2015-07-26 NOTE — Patient Instructions (Signed)
LE instructions and precautions as established- see initial eval.   

## 2015-07-29 ENCOUNTER — Ambulatory Visit: Payer: Medicare Other | Admitting: Occupational Therapy

## 2015-07-29 ENCOUNTER — Encounter: Payer: Self-pay | Admitting: *Deleted

## 2015-07-29 DIAGNOSIS — I890 Lymphedema, not elsewhere classified: Secondary | ICD-10-CM

## 2015-07-29 NOTE — Therapy (Signed)
[place] [facility] MAIN [facility] SERVICES [address] [place], [place], [address] Phone: [phone]   Fax:  [phone]  Occupational Therapy Treatment  Patient Details  Name: Alison Fox MRN: [id number] Date of Birth: [birth year] Referring Provider:  [name], MD  Encounter Date: 07/29/2015      OT End of Session - 07/29/15 1223    Visit Number 5   Number of Visits 36   Date for OT Re-Evaluation 10/01/15   OT Start Time 1120   OT Stop Time 1217   OT Time Calculation (min) 57 min   Activity Tolerance Patient tolerated treatment well;No increased pain   Behavior During Therapy [facility] for tasks assessed/performed      Past Medical History  Diagnosis Date  . Lymphedema     Chronic following left knee surgery in 2000.  Marland Kitchen Headache     Past Surgical History  Procedure Laterality Date  . Knee arthroscopy  2000    Left knee.     There were no vitals filed for this visit.  Visit Diagnosis:  Lymphedema      Subjective Assessment - 07/29/15 1219    Subjective  Alison Fox ipresents for visit 5 for CDT to BLE- emphasis on the LLE.Pt is accompanied by her mom. Pt presents w/ compression wraps in place. Pt reorts she did soak LLE and feet over the weekend as instructed, but only for ~10 minutes. She tells me shost of her exercises. We discussed importance of LE self care  between sessions for optimal clinical and functional gains, and for LE management over time. Pt agrees to work harder on protocols learned so far.   Patient is accompained by: Family member   Pertinent History onset in 46s and positive family history for maternal grandmother and cousin suggests primary LE Tarda; arthroscopic knee sx ~ 2000?, recent fall w/ head laceration. 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 No/denies   Pain Onset More than a month ago                              OT Education - 07/29/15 1222    Education provided Yes   Education Details Continued skilled Pt/caregiver Education  And LE ADL training throughout visit for lymphedema self care components, including compression wrapping, compression garment and device wear/care, lymphatic pumping ther ex, simple self-MLD, and skin care.    Person(s) Educated Patient;Parent(s)   Methods Explanation;Demonstration;Tactile cues;Verbal cues;Handout   Comprehension Verbalized understanding;Tactile cues required;Verbal cues required;Need further instruction             OT Long Term Goals - 07/03/15 1626    OT LONG TERM GOAL #1   Title Pt able to correctly apply gradient compression wraps to below knee with moderate caregiver assistance for optimal limb volume reduction and improvement in tissue integrity.   Baseline dependent    Time 2   Period Weeks   Status New   OT LONG TERM GOAL #2   Title Pt to achieve 15% limb volume reduction in LLE, and 10% reduction in RLE by DC to limit lymphedema (LE) progression and infection risk.   Baseline dependent   Time 12   Period Weeks   Status New  OT LONG TERM GOAL #3   Title Pt > 75% compliant with all daily LE self-care protocols w/ moderate caregiver assistance, including simple self-manual lymphatic drainage (MLD), skin care, lymphatic pumping therex, and donning/ doffing progression garments.   Baseline dependent   Time 12   Period Weeks   Status New   OT LONG TERM GOAL #4   Baseline dependent   Time 12   Period Weeks   Status New   OT LONG TERM GOAL #5   Title Pt to remain infection free throughout CDT course to limit infection and LE progression.   Baseline dependent   Time 12   Period Weeks   Status New   Long Term Additional Goals   Additional Long Term Goals Yes   OT LONG TERM GOAL #6   Title During Management  Phase CDT Pt to sustain limb volume reductions achieved during Intensive Phase CDT within 5% utilizing LE self-care protocols, appropriate compression garments/ devices, and moderate caregiver assistance.   Baseline dependent   Time 6   Period Months   Status New               Plan - 07/29/15 1223    Clinical Impression Statement Skin integriity, tissue density and swelling continue to improve between sessions. Pt is performing LE self care between sessions, but needs ongoing encouragment and teaching to increase performance ability and compliance..    Pt will benefit from skilled therapeutic intervention in order to improve on the following deficits (Retired) Difficulty walking;Impaired flexibility;Increased edema;Decreased knowledge of precautions;Decreased skin integrity;Pain;Decreased balance;Decreased knowledge of use of DME;Decreased scar mobility;Decreased mobility   Rehab Potential Good   Clinical Impairments Affecting Rehab Potential decreased balance, difficulty walkingL decreased L knee akle and foot range of motion 2/2 girth and decreased tissue flexibility at joints, falls risk, infection risk   OT Frequency 3x / week   OT Duration 12 weeks   OT Treatment/Interventions Self-care/ADL training;DME and/or AE instruction;Manual lymph drainage;Patient/family education;Compression bandaging;Therapeutic exercises;Scar mobilization;Manual Therapy   Consulted and Agree with Plan of Care Patient;Family member/caregiver        Problem List Patient Active Problem List   Diagnosis Date Noted  . Episodic tension-type headache, not intractable 07/22/2015  . Visit for suture removal 06/25/2015  . Hydrocephalus, adult 09/10/2014  . Glaucoma 08/22/2014  . Vision decreased 08/22/2014  . Frontal headache 08/21/2014  . Right leg swelling 02/27/2014  . Chest pain on respiration 02/27/2014  . Postural dizziness 04/26/2013  . Fatigue 04/26/2013  . Abnormality of gait 01/25/2013  .  Preventative health care 05/18/2011  . Lymphedema 08/06/2006    Loel Dubonnet, MS, OTR/L, [facility] 07/29/2015 12:27 PM   [place] [facility] MAIN [facility] SERVICES [address] [place], [place], [address] Phone: [phone]   Fax:  [phone]

## 2015-07-29 NOTE — Patient Instructions (Signed)
LE instructions and precautions as established- see initial eval.   

## 2015-07-31 ENCOUNTER — Ambulatory Visit: Payer: Medicare Other | Admitting: Occupational Therapy

## 2015-07-31 DIAGNOSIS — I890 Lymphedema, not elsewhere classified: Secondary | ICD-10-CM | POA: Diagnosis not present

## 2015-07-31 NOTE — Patient Instructions (Signed)
LE instructions and precautions as established- see initial eval.   

## 2015-07-31 NOTE — Therapy (Signed)
[place] [facility] MAIN [facility] SERVICES [address] [place], [place], [address] Phone: [phone]   Fax:  [phone]  Occupational Therapy Treatment  Patient Details  Name: Alison Fox MRN: [id number] Date of Birth: Nov 02, 1970 Referring Provider:  [name], MD  Encounter Date: 07/31/2015      OT End of Session - 07/31/15 1618    Visit Number 6   Number of Visits 36   Date for OT Re-Evaluation 10/01/15   OT Start Time 1100   OT Stop Time 1205   OT Time Calculation (min) 65 min   Activity Tolerance Patient tolerated treatment well;No increased pain   Behavior During Therapy [facility] for tasks assessed/performed      Past Medical History  Diagnosis Date  . Lymphedema     Chronic following left knee surgery in 2000.  Marland Kitchen Headache     Past Surgical History  Procedure Laterality Date  . Knee arthroscopy  2000    Left knee.     There were no vitals filed for this visit.  Visit Diagnosis:  Lymphedema      Subjective Assessment - 07/31/15 1109    Subjective  Alison Fox ipresents for visit 6 for CDT to BLE- emphasis on the LLE.Pt is accompanied by her mom. Pt presents w/ compression wraps in place. Pt has no new compaints. She is tolerating treatment and performing LE self care protocols learned thus far between visits w/ her mom's help.   Patient is accompained by: Family member   Pertinent History onset in 96s and positive family history for maternal grandmother and cousin suggests primary LE Tarda; arthroscopic knee sx ~ 2000?, recent fall w/ head laceration. 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 No/denies   Pain Onset More than a month ago                      OT Treatments/Exercises (OP) - 07/31/15 0001    ADLs   ADL  Education Given Yes   Manual Therapy   Manual Therapy Edema management;Manual Lymphatic Drainage (MLD);Compression Bandaging;Other (comment)   Soft tissue mobilization fibrosis technique at L ankle   Manual Lymphatic Drainage (MLD) Manual lymph drainage (MLD) to LLE in supine utilizing functional inguinal lymph nodes and deep abdominal lymphatics as is customary for non-cancer related lower extremity LE, including bilateral "short neck" sequence, J strokes to sub and supraclavicular LN, deep abdominal pathways, functional inguinal LN, lower extremity proximal to distal w/ emphasis on medial knee bottleneck and politeal LN. Performed fibrosis technique to B maleoli and distal  legs to address fatty fibrosis. Good tolerance.   Compression Bandaging LLE gradient compression wraps applied circumferentially in gradient configuration from toes to groin as follows: toe wrap x1 under cotton stockinett; 8 cm x 1 to foot and ankle, 10 cm x 2, then  12 cm x 2- all layered over .04 x 10 cm and 12 cm Rosidol Soft foam from A-G.   Other Manual Therapy skin care BLE                OT Education - 07/31/15 1111    Education provided Yes   Education Details Continued skilled Pt and caregiver edu for LE self care protocols w/ emphasis on simple self MLD today.  Person(s) Educated Patient;Parent(s)   Methods Explanation;Demonstration   Comprehension Verbalized understanding;Need further instruction             OT Long Term Goals - 07/03/15 1626    OT LONG TERM GOAL #1   Title Pt able to correctly apply gradient compression wraps to below knee with moderate caregiver assistance for optimal limb volume reduction and improvement in tissue integrity.   Baseline dependent    Time 2   Period Weeks   Status New   OT LONG TERM GOAL #2   Title Pt to achieve 15% limb volume reduction in LLE, and 10% reduction in RLE by DC to limit lymphedema (LE) progression and infection risk.   Baseline dependent    Time 12   Period Weeks   Status New   OT LONG TERM GOAL #3   Title Pt > 75% compliant with all daily LE self-care protocols w/ moderate caregiver assistance, including simple self-manual lymphatic drainage (MLD), skin care, lymphatic pumping therex, and donning/ doffing progression garments.   Baseline dependent   Time 12   Period Weeks   Status New   OT LONG TERM GOAL #4   Baseline dependent   Time 12   Period Weeks   Status New   OT LONG TERM GOAL #5   Title Pt to remain infection free throughout CDT course to limit infection and LE progression.   Baseline dependent   Time 12   Period Weeks   Status New   Long Term Additional Goals   Additional Long Term Goals Yes   OT LONG TERM GOAL #6   Title During Management Phase CDT Pt to sustain limb volume reductions achieved during Intensive Phase CDT within 5% utilizing LE self-care protocols, appropriate compression garments/ devices, and moderate caregiver assistance.   Baseline dependent   Time 6   Period Months   Status New               Plan - 07/31/15 1112    Clinical Impression Statement She is tolerating treatment and performing LE self care protocols learned thus far between visits w/ her mom's help.Sewelling continues to decrease and LLE skin condition continues to improve with increased hydration and decreased density. Pt is learning simple self MLD today at intro level for J stroke and neck seqence. Ecouraging improved compliance w/ LE self care home program.   Pt will benefit from skilled therapeutic intervention in order to improve on the following deficits (Retired) Difficulty walking;Impaired flexibility;Increased edema;Decreased knowledge of precautions;Decreased skin integrity;Pain;Decreased balance;Decreased knowledge of use of DME;Decreased scar mobility;Decreased mobility   Rehab Potential Good   Clinical Impairments Affecting Rehab Potential decreased balance, difficulty walkingL decreased L knee akle and  foot range of motion 2/2 girth and decreased tissue flexibility at joints, falls risk, infection risk   OT Frequency 3x / week   OT Duration 12 weeks   OT Treatment/Interventions Self-care/ADL training;DME and/or AE instruction;Manual lymph drainage;Patient/family education;Compression bandaging;Therapeutic exercises;Scar mobilization;Manual Therapy   Consulted and Agree with Plan of Care Patient;Family member/caregiver        Problem List Patient Active Problem List   Diagnosis Date Noted  . Episodic tension-type headache, not intractable 07/22/2015  . Visit for suture removal 06/25/2015  . Hydrocephalus, adult 09/10/2014  . Glaucoma 08/22/2014  . Vision decreased 08/22/2014  . Frontal headache 08/21/2014  . Right leg swelling 02/27/2014  . Chest pain on respiration 02/27/2014  . Postural dizziness 04/26/2013  . Fatigue 04/26/2013  . Abnormality of  gait 01/25/2013  . Preventative health care 05/18/2011  . Lymphedema 08/06/2006   Loel Dubonnetheresa Kardell Virgil, MS, OTR/L, [facility] 07/31/2015 4:21 PM   [place] [facility] [address] [place], [place], [address] Phone: [phone]   Fax:  [phone]

## 2015-08-02 ENCOUNTER — Ambulatory Visit: Payer: Medicare Other | Admitting: Occupational Therapy

## 2015-08-02 DIAGNOSIS — I890 Lymphedema, not elsewhere classified: Secondary | ICD-10-CM | POA: Diagnosis not present

## 2015-08-02 NOTE — Patient Instructions (Signed)
LE instructions and precautions as established- see initial eval.   

## 2015-08-02 NOTE — Therapy (Signed)
[facility] [facility] MAIN [facility] SERVICES [address] [place], [place], [address] Phone: [phone]   Fax:  [phone]  Occupational Therapy Treatment  Patient Details  Name: Alison Fox MRN: [id number] Date of Birth: 01-24-1971 No Data Recorded  Encounter Date: 08/02/2015      OT End of Session - 08/02/15 1728    Visit Number 7   Number of Visits 36   Date for OT Re-Evaluation 10/01/15   OT Start Time 1100   OT Stop Time 1315   OT Time Calculation (min) 135 min   Activity Tolerance Patient tolerated treatment well;No increased pain   Behavior During Therapy [facility] for tasks assessed/performed      Past Medical History  Diagnosis Date  . Lymphedema     Chronic following left knee surgery in 2000.  Marland Kitchen Headache     Past Surgical History  Procedure Laterality Date  . Knee arthroscopy  2000    Left knee.     There were no vitals filed for this visit.  Visit Diagnosis:  Lymphedema      Subjective Assessment - 08/02/15 1203    Subjective  Manufacturer's rep for [employer]  is here today to assist w/ Pt and parent education re: benefits and contraindications of sequential pneumatic lymphedema pumpis and care and recommended use of devicesfor optimal LE self management at home.   Patient is accompained by: Family member   Pertinent History onset in 43s and positive family history for maternal grandmother and cousin suggests primary LE Tarda; arthroscopic knee sx ~ 2000?, recent fall w/ head laceration. 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 No/denies   Pain Onset More than a month ago             LYMPHEDEMA/ONCOLOGY QUESTIONNAIRE - 08/02/15 1216    Left Lower Extremity Lymphedema   Other LLE limb volume from toes to below kee (A-D)  =4433.13 ml. Toes to groin volume=9473.04 ml   Other limb volume differential A-D= 41.75% L>R; LVD full leg =29.88 L>R   Other Circumferential abdomen= 82 cm before basic trial. After basic trial = 88 cm                 OT Treatments/Exercises (OP) - 08/02/15 0001    ADLs   ADL Education Given Yes   Manual Therapy   Manual Therapy Edema management;Compression Bandaging;Other (comment)  basic & Flexitouch sequentiual compression pump trials   Manual therapy comments comparative limb volumetrics; circumferential abdominal measurement for trunkal edema   Compression Bandaging LLE gradient compression wraps applied circumferentially in gradient configuration from toes to groin as follows: toe wrap x1 under cotton stockinett; 8 cm x 1 to foot and ankle, 10 cm x 2, then  12 cm x 2- all layered over .04 x 10 cm and 12 cm Rosidol Soft foam from A-G.                OT Education - 08/02/15 1213    Education Details Emphasis of Rx today on Pt and parent education for benefits and contraindications of pneumatic compression devices as adjunct to LE self self care al home    Person(s) Educated Patient;Parent(s)   Methods Explanation;Demonstration;Handout   Comprehension Verbalized understanding;Need further instruction  OT Long Term Goals - 07/03/15 1626    OT LONG TERM GOAL #1   Title Pt able to correctly apply gradient compression wraps to below knee with moderate caregiver assistance for optimal limb volume reduction and improvement in tissue integrity.   Baseline dependent    Time 2   Period Weeks   Status New   OT LONG TERM GOAL #2   Title Pt to achieve 15% limb volume reduction in LLE, and 10% reduction in RLE by DC to limit lymphedema (LE) progression and infection risk.   Baseline dependent   Time 12   Period Weeks   Status New   OT LONG TERM GOAL #3   Title Pt > 75% compliant with all daily LE self-care protocols w/ moderate caregiver assistance,  including simple self-manual lymphatic drainage (MLD), skin care, lymphatic pumping therex, and donning/ doffing progression garments.   Baseline dependent   Time 12   Period Weeks   Status New   OT LONG TERM GOAL #4   Baseline dependent   Time 12   Period Weeks   Status New   OT LONG TERM GOAL #5   Title Pt to remain infection free throughout CDT course to limit infection and LE progression.   Baseline dependent   Time 12   Period Weeks   Status New   Long Term Additional Goals   Additional Long Term Goals Yes   OT LONG TERM GOAL #6   Title During Management Phase CDT Pt to sustain limb volume reductions achieved during Intensive Phase CDT within 5% utilizing LE self-care protocols, appropriate compression garments/ devices, and moderate caregiver assistance.   Baseline dependent   Time 6   Period Months   Status New               Plan - 08/02/15 1214    Clinical Impression Statement LLE comparative limb volumetrics reveal good progress towards CDT goals today. Below the knee limb volume is decreased by 16.94%, and toes to groin volume is decreased by 12% since initially measured on 07/03/15, which meets and exceeds the 10% reduction goal.Limb volume differential (LVD) The Flexitouch lymphedema system and basic 8 cham below the knee ber pump were demonstrated and trialed in clinic  today. Peasures 41.75%, L.R. LVDt failed trial w/ basic unit due to 6 cm increase of lymph from toes to groin measures 29.88%, L>R. , which is decreased from overall LVD initially at atic congestion in the abdomen (82 cm before; 88cm after) accompanied by increased abdominal discomfort ("fullness" and "tightness"). Thirty-twochamber Flexitouch system with abdomenal compression component  was tolerated tolerated without discomfort. and a visible decrease in LLE swelling w/ positive indentations in fibrotic areas was noted  at the end of the session.The Flexitouch sequential pneumatic compression pump  system is medically necessary for optimal control of progressive, moderate Stage II,  bilateral lower extremity lymphedema.Cognitive disabilities limit this Pt's compliance with LE self care and her ability to execute proper manual lymphatic drainage sequences. Lastly, the Flexitouch syetem provides the abdomenal component needed to limit abdominal and genital congestion and increased fibrosis over time.    Pt will benefit from skilled therapeutic intervention in order to improve on the following deficits (Retired) Difficulty walking;Impaired flexibility;Increased edema;Decreased knowledge of precautions;Decreased skin integrity;Pain;Decreased balance;Decreased knowledge of use of DME;Decreased scar mobility;Decreased mobility  impaired cognition, including delayed processing, decreased memory, and decreased visual spatial skills   Clinical Impairments Affecting Rehab Potential decreased balance, difficulty walkingL decreased L knee akle  and foot range of motion 2/2 girth and decreased tissue flexibility at joints, falls risk, infection risk, impaired cognition needed for learning LE self cae   OT Frequency 3x / week   OT Duration 12 weeks   OT Treatment/Interventions Self-care/ADL training;DME and/or AE instruction;Manual lymph drainage;Patient/family education;Compression bandaging;Therapeutic exercises;Scar mobilization;Manual Therapy   OT Home Exercise Plan continue LE ther ex daily as directed  w/ parent cuing   Consulted and Agree with Plan of Care Patient        Problem List Patient Active Problem List   Diagnosis Date Noted  . Episodic tension-type headache, not intractable 07/22/2015  . Visit for suture removal 06/25/2015  . Hydrocephalus, adult 09/10/2014  . Glaucoma 08/22/2014  . Vision decreased 08/22/2014  . Frontal headache 08/21/2014  . Right leg swelling 02/27/2014  . Chest pain on respiration 02/27/2014  . Postural dizziness 04/26/2013  . Fatigue 04/26/2013  . Abnormality  of gait 01/25/2013  . Preventative health care 05/18/2011  . Lymphedema 08/06/2006   Loel Dubonnetheresa Delmar Dondero, MS, OTR/L, [facility] 08/02/2015 6:02 PM   [facility] [facility] [address] [place], [place], [address] Phone: [phone]   Fax:  [phone]  Name: Merrilee SeashoreFrances Legrand MRN: [id number] Date of Birth: 07-Aug-1971

## 2015-08-05 ENCOUNTER — Ambulatory Visit: Payer: Medicare Other | Admitting: Occupational Therapy

## 2015-08-05 DIAGNOSIS — I890 Lymphedema, not elsewhere classified: Secondary | ICD-10-CM

## 2015-08-05 NOTE — Patient Instructions (Signed)
LE instructions and precautions as established- see initial eval.   

## 2015-08-05 NOTE — Therapy (Signed)
[place] [place] [facility] MAIN [facility] SERVICES [address] [place], [place], [address] Phone: [phone]   Fax:  [phone]  Occupational Therapy Treatment  Patient Details  Name: Alison Fox MRN: [id number] Date of Birth: 06-21-1971 No Data Recorded  Encounter Date: 08/05/2015      OT End of Session - 08/05/15 1202    Visit Number 8   Number of Visits 36   Date for OT Re-Evaluation 10/01/15   OT Start Time 1108   OT Stop Time 1203   OT Time Calculation (min) 55 min   Activity Tolerance Patient tolerated treatment well;No increased pain   Behavior During Therapy [facility] for tasks assessed/performed      Past Medical History  Diagnosis Date  . Lymphedema     Chronic following left knee surgery in 2000.  Marland Kitchen Headache     Past Surgical History  Procedure Laterality Date  . Knee arthroscopy  2000    Left knee.     There were no vitals filed for this visit.  Visit Diagnosis:  Lymphedema      Subjective Assessment - 08/05/15 1201    Subjective  Pt presents for OT Rx visit 8 for CDT to BLE w/ compression wraps in place. Reviewed again today importance of home program components daily, including diligent skin care and ther ex in addition to wraps. Pt will bring OT checklist to clinic at end of week to show progress towards these goals.   Patient is accompained by: Family member   Pertinent History onset in 18s and positive family history for maternal grandmother and cousin suggests primary LE Tarda; arthroscopic knee sx ~ 2000?, recent fall w/ head laceration. 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 No/denies   Pain Onset --                      OT Treatments/Exercises (OP) - 08/05/15 0001    ADLs   ADL Education Given Yes   Manual  Therapy   Manual Therapy Edema management;Manual Lymphatic Drainage (MLD);Compression Bandaging;Other (comment)  BLE skin care   Soft tissue mobilization fibrosis technique at L ankle   Compression Bandaging LLE gradient compression wraps applied circumferentially in gradient configuration from toes to groin as follows: toe wrap x1 under cotton stockinett; 8 cm x 1 to foot and ankle, 10 cm x 2, then  12 cm x 2- all layered over .04 x 10 cm and 12 cm Rosidol Soft foam from A-G.                OT Education - 08/05/15 1208    Education provided Yes   Education Details Reviewed home program components learned thus far. Created and provided custom calendar format check list to cue performance of LE  ther ex and skin care blaterally 2 x daily for next month. Pt agrees to bring to session at end of each week to review progress.   Person(s) Educated Patient;Parent(s)   Methods Handout;Explanation;Demonstration   Comprehension Verbalized understanding;Returned demonstration;Verbal cues required;Need further instruction             OT Long Term Goals - 07/03/15 1626    OT LONG TERM GOAL #1   Title Pt able to correctly apply gradient compression wraps to below  knee with moderate caregiver assistance for optimal limb volume reduction and improvement in tissue integrity.   Baseline dependent    Time 2   Period Weeks   Status New   OT LONG TERM GOAL #2   Title Pt to achieve 15% limb volume reduction in LLE, and 10% reduction in RLE by DC to limit lymphedema (LE) progression and infection risk.   Baseline dependent   Time 12   Period Weeks   Status New   OT LONG TERM GOAL #3   Title Pt > 75% compliant with all daily LE self-care protocols w/ moderate caregiver assistance, including simple self-manual lymphatic drainage (MLD), skin care, lymphatic pumping therex, and donning/ doffing progression garments.   Baseline dependent   Time 12   Period Weeks   Status New   OT LONG TERM GOAL  #4   Baseline dependent   Time 12   Period Weeks   Status New   OT LONG TERM GOAL #5   Title Pt to remain infection free throughout CDT course to limit infection and LE progression.   Baseline dependent   Time 12   Period Weeks   Status New   Long Term Additional Goals   Additional Long Term Goals Yes   OT LONG TERM GOAL #6   Title During Management Phase CDT Pt to sustain limb volume reductions achieved during Intensive Phase CDT within 5% utilizing LE self-care protocols, appropriate compression garments/ devices, and moderate caregiver assistance.   Baseline dependent   Time 6   Period Months   Status New               Plan - 08/05/15 1212    Clinical Impression Statement LLE continues to decrease in volume  and tissue density. Redness is mildly reduced and skin hydration has improved 50% since commencing OT. Pt still not up to speed on home program frequency between sessions. Hopefully  checklist provided today will serve as a cue to performance and  a reminder of accountability as Pt works on  habituation   Pt will benefit from skilled therapeutic intervention in order to improve on the following deficits (Retired) Difficulty walking;Impaired flexibility;Increased edema;Decreased knowledge of precautions;Decreased skin integrity;Pain;Decreased balance;Decreased knowledge of use of DME;Decreased scar mobility;Decreased mobility  impaired cognition, including delayed processing, decreased memory, and decreased visual spatial skills   Rehab Potential Good   Clinical Impairments Affecting Rehab Potential decreased balance, difficulty walkingL decreased L knee akle and foot range of motion 2/2 girth and decreased tissue flexibility at joints, falls risk, infection risk, impaired cognition needed for learning LE self cae   OT Frequency 3x / week   OT Duration 12 weeks   OT Treatment/Interventions Self-care/ADL training;DME and/or AE instruction;Manual lymph drainage;Patient/family  education;Compression bandaging;Therapeutic exercises;Scar mobilization;Manual Therapy   OT Home Exercise Plan continue LE ther ex daily as directed  w/ parent cuing   Consulted and Agree with Plan of Care Patient        Problem List Patient Active Problem List   Diagnosis Date Noted  . Episodic tension-type headache, not intractable 07/22/2015  . Visit for suture removal 06/25/2015  . Hydrocephalus, adult 09/10/2014  . Glaucoma 08/22/2014  . Vision decreased 08/22/2014  . Frontal headache 08/21/2014  . Right leg swelling 02/27/2014  . Chest pain on respiration 02/27/2014  . Postural dizziness 04/26/2013  . Fatigue 04/26/2013  . Abnormality of gait 01/25/2013  . Preventative health care 05/18/2011  . Lymphedema 08/06/2006   Loel Dubonnetheresa Seena Ritacco, MS, OTR/L,  CLT-LANA 08/05/2015 12:15 PM   [facility] [facility] MAIN [facility] SERVICES [address] [place], [place], [address] Phone: [phone]   Fax:  [phone]  Name: Shanena Pellegrino MRN: [id number] Date of Birth: [birth year]

## 2015-08-06 DIAGNOSIS — I890 Lymphedema, not elsewhere classified: Secondary | ICD-10-CM | POA: Diagnosis not present

## 2015-08-07 ENCOUNTER — Ambulatory Visit: Payer: Medicare Other | Admitting: Occupational Therapy

## 2015-08-07 DIAGNOSIS — I890 Lymphedema, not elsewhere classified: Secondary | ICD-10-CM

## 2015-08-07 NOTE — Therapy (Signed)
[facility] [address] [place], [place], [address] Phone: [phone]   Fax:  [phone]  Occupational Therapy Treatment  Patient Details  Name: Alison SeashoreFrances Gonzalo MRN: [id number] Date of Birth: 11/25/1970 No Data Recorded  Encounter Date: 08/07/2015      OT End of Session - 08/07/15 1210    Visit Number 9   Number of Visits 36   Date for OT Re-Evaluation 10/01/15   OT Start Time 1112   OT Stop Time 1211   OT Time Calculation (min) 59 min   Activity Tolerance Patient tolerated treatment well;No increased pain   Behavior [facility] Therapy [facility] for tasks assessed/performed      Past Medical History  Diagnosis Date  . Lymphedema     Chronic following left knee surgery in 2000.  Marland Kitchen. Headache     Past Surgical History  Procedure Laterality Date  . Knee arthroscopy  2000    Left knee.     There were no vitals filed for this visit.  Visit Diagnosis:  Lymphedema      Subjective Assessment - 08/07/15 1118    Subjective  Pt presents for OT Rx visit 8 for CDT to BLE w/ compression wraps in place.Pt brings home program caendar showing self care performed as directed during visit interval.    Patient is accompained by: Family member   Pertinent History onset in 1220s and positive family history for maternal grandmother and cousin suggests primary LE Tarda; arthroscopic knee sx ~ 2000?, recent fall w/ head laceration. 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 No/denies   Pain Onset More than a month ago                      OT Treatments/Exercises (OP) - 08/07/15 0001    ADLs   ADL Education Given Yes   Manual Therapy   Manual Therapy Edema management;Manual Lymphatic Drainage (MLD);Compression Bandaging;Other  (comment)   Soft tissue mobilization fibrosis technique at L ankle   Manual Lymphatic Drainage (MLD) MLD to LLE as est.   Compression Bandaging LLE gradient compression wraps applied circumferentially in gradient configuration from toes to groin as follows: toe wrap x1 under cotton stockinett; 8 cm x 1 to foot and ankle, 10 cm x 2, then  12 cm x 2- all layered over .04 x 10 cm and 12 cm Rosidol Soft foam from A-G.   Other Manual Therapy skin care BLE                OT Education - 08/07/15 1119    Education provided Yes   Education Details Emphasis of LE self care training today on simple self MLD. After skilled training Pt able to perform 2-step "short neck" sequence using proper techniques w/ VC and modeling.   Person(s) Educated Patient;Parent(s)   Methods Explanation;Demonstration;Tactile cues;Verbal cues;Handout   Comprehension Verbalized understanding;Returned demonstration;Verbal cues required;Tactile cues required;Need further instruction             OT Long Term Goals - 07/03/15 1626    OT LONG TERM GOAL #1   Title Pt able to correctly apply gradient compression wraps to below knee with moderate caregiver assistance for optimal limb volume reduction and improvement in tissue integrity.   Baseline  dependent    Time 2   Period Weeks   Status New   OT LONG TERM GOAL #2   Title Pt to achieve 15% limb volume reduction in LLE, and 10% reduction in RLE by DC to limit lymphedema (LE) progression and infection risk.   Baseline dependent   Time 12   Period Weeks   Status New   OT LONG TERM GOAL #3   Title Pt > 75% compliant with all daily LE self-care protocols w/ moderate caregiver assistance, including simple self-manual lymphatic drainage (MLD), skin care, lymphatic pumping therex, and donning/ doffing progression garments.   Baseline dependent   Time 12   Period Weeks   Status New   OT LONG TERM GOAL #4   Baseline dependent   Time 12   Period Weeks   Status New    OT LONG TERM GOAL #5   Title Pt to remain infection free throughout CDT course to limit infection and LE progression.   Baseline dependent   Time 12   Period Weeks   Status New   Long Term Additional Goals   Additional Long Term Goals Yes   OT LONG TERM GOAL #6   Title During Management Phase CDT Pt to sustain limb volume reductions achieved during Intensive Phase CDT within 5% utilizing LE self-care protocols, appropriate compression garments/ devices, and moderate caregiver assistance.   Baseline dependent   Time 6   Period Months   Status New               Plan - 08/07/15 1212    Clinical Impression Statement Pt demonstrates steady progress towards all OT golas for CDT. Pt showed improved compliance w/ self care with help of calendar checklist. Skin condition continues to improve and swelling continues to reduce. Pt is learning simple self MLD at a slow pace, but she uses correct techniques and is andgaged and  eager to participate in all aspects of clinical vsiits.   Pt will benefit from skilled therapeutic intervention in order to improve on the following deficits (Retired) Difficulty walking;Impaired flexibility;Increased edema;Decreased knowledge of precautions;Decreased skin integrity;Pain;Decreased balance;Decreased knowledge of use of DME;Decreased scar mobility;Decreased mobility  impaired cognition, including delayed processing, decreased memory, and decreased visual spatial skills   Rehab Potential Good   Clinical Impairments Affecting Rehab Potential decreased balance, difficulty walkingL decreased L knee akle and foot range of motion 2/2 girth and decreased tissue flexibility at joints, falls risk, infection risk, impaired cognition needed for learning LE self cae   OT Frequency 3x / week   OT Duration 12 weeks   OT Treatment/Interventions Self-care/ADL training;DME and/or AE instruction;Manual lymph drainage;Patient/family education;Compression bandaging;Therapeutic  exercises;Scar mobilization;Manual Therapy   OT Home Exercise Plan continue LE ther ex daily as directed  w/ parent cuing   Consulted and Agree with Plan of Care Patient        Problem List Patient Active Problem List   Diagnosis Date Noted  . Episodic tension-type headache, not intractable 07/22/2015  . Visit for suture removal 06/25/2015  . Hydrocephalus, adult 09/10/2014  . Glaucoma 08/22/2014  . Vision decreased 08/22/2014  . Frontal headache 08/21/2014  . Right leg swelling 02/27/2014  . Chest pain on respiration 02/27/2014  . Postural dizziness 04/26/2013  . Fatigue 04/26/2013  . Abnormality of gait 01/25/2013  . Preventative health care 05/18/2011  . Lymphedema 08/06/2006   Loel Dubonnet, MS, OTR/L, CLT-LANA 08/07/2015 12:16 PM   [place] [facility] REHAB SERVICES [address] [place], [place], [address] Phone: [phone]   Fax:  [phone]  Name: Dakoda Bassette MRN: [id number] Date of Birth: [birth year]

## 2015-08-07 NOTE — Patient Instructions (Signed)
LE instructions and precautions as established- see initial eval.   

## 2015-08-09 ENCOUNTER — Ambulatory Visit: Payer: Medicare Other | Admitting: Occupational Therapy

## 2015-08-09 DIAGNOSIS — I890 Lymphedema, not elsewhere classified: Secondary | ICD-10-CM

## 2015-08-09 NOTE — Therapy (Signed)
[place] MAIN [facility] SERVICES [address] [place], [place], [address] Phone: [phone]   Fax:  [phone]  Occupational Therapy Treatment & Progress Note   Patient Details  Name: Alison Fox MRN: [id number] Date of Birth: 08-Dec-1970 No Data Recorded  Encounter Date: 08/09/2015      OT End of Session - 08/09/15 1209    Visit Number 10   Number of Visits 36   Date for OT Re-Evaluation 10/01/15   OT Start Time 1104   OT Stop Time 2233   OT Time Calculation (min) 60 min   Activity Tolerance Patient tolerated treatment well;No increased pain   Behavior During Therapy [facility] for tasks assessed/performed      Past Medical History  Diagnosis Date  . Lymphedema     Chronic following left knee surgery in 2000.  Marland Kitchen Headache     Past Surgical History  Procedure Laterality Date  . Knee arthroscopy  2000    Left knee.     There were no vitals filed for this visit.  Visit Diagnosis:  Lymphedema      Subjective Assessment - 08/09/15 1110    Subjective  Pt presents for OT Rx visit 10 for CDT to BLE w/ compression wraps in place.Pt reports, "My toes are sore."   Patient is accompained by: Family member   Pertinent History onset in 60s and positive family history for maternal grandmother and cousin suggests primary LE Tarda; arthroscopic knee sx ~ 2000?, recent fall w/ head laceration. 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 No/denies   Pain Onset More than a month ago             LYMPHEDEMA/ONCOLOGY QUESTIONNAIRE - 08/09/15 1201    Left Lower Extremity Lymphedema   Other LLE limb volume from toes to below kee (A-D) =4111.4 ml. Toes to groin volume=9553.39 ml   Other limb volume differential A-D= 24.4% L>R; LVD full leg =24.4 L>R                  OT Treatments/Exercises (OP) - 08/09/15 0001    Manual Therapy   Manual Therapy Edema management;Manual Lymphatic Drainage (MLD);Compression Bandaging;Other (comment)   Manual therapy comments comparative limb volumetrics; circumferential abdominal measurement for trunkal edema   Soft tissue mobilization fibrosis technique at L ankle   Manual Lymphatic Drainage (MLD) MLD to LLE as est.   Compression Bandaging LLE gradient compression wraps applied circumferentially in gradient configuration from toes to groin as follows: toe wrap x1 under cotton stockinett; 8 cm x 1 to foot and ankle, 10 cm x 2, then  12 cm x 2- all layered over .04 x 10 cm and 12 cm Rosidol Soft foam from A-G.   Other Manual Therapy skin care BLE                OT Education - 08/09/15 1205    Education provided Yes   Education Details Emphasis of self care today on progress towards goals and support for increasing compliance w/ home program. Reviewed care and use regimes for existing HOS devices-    Person(s) Educated Patient;Parent(s)   Methods Explanation;Tactile cues;Demonstration;Verbal cues   Comprehension Verbalized understanding;Returned demonstration;Verbal cues required;Tactile cues required;Need further instruction  OT Long Term Goals - 08/09/15 1217    OT LONG TERM GOAL #1   Title Pt able to correctly apply gradient compression wraps to below knee with moderate caregiver assistance for optimal limb volume reduction and improvement in tissue integrity.   Baseline dependent    Time 2   Period Weeks   Status Achieved   OT LONG TERM GOAL #2   Title Pt to achieve 15% limb volume reduction in LLE, and 10% reduction in RLE by DC to limit lymphedema (LE) progression and infection risk.- NEW GOAL 25%   Baseline dependent   Time 12   Status Achieved   OT LONG TERM GOAL #3   Baseline dependent   Time 12   Period Weeks   Status Partially Met   OT LONG TERM GOAL #5    Title Pt to remain infection free throughout CDT course to limit infection and LE progression.   Time 12   Period Weeks   Status Partially Met   OT LONG TERM GOAL #6   Title During Management Phase CDT Pt to sustain limb volume reductions achieved during Intensive Phase CDT within 5% utilizing LE self-care protocols, appropriate compression garments/ devices, and moderate caregiver assistance.   Time 6   Period Months   Status On-going               Plan - 08/09/15 1210    Clinical Impression Statement Pt continues to make steady progress towards all goals. Skin condition and fibrosis continues to improve bilaterally. Comparative limb volumetrics reveal additional below the knee LLE limb volume reduction of 7.25% since last measured on 08/02/15. Toes to groin limb volume is increased so slightly, by 0.08%, a value so slightr that this most likely represents a plateau in full leg measurement. Limb volume differential from knee is decreased from initial 41.75% to 22.96%, L>R, today. Overall LVD is decreased from initial 29.88%, L>R, to 24.4%, L>R, today. Pt is slowly learning LE self care protocols and demonstrates imprved compliance w/ use of checklist this week. Existing Reid Sleeve Classic A-G for LLE and Reid knee length Comfort to LLE were assessed today. LLE device is poorly fitting, worn and abandoned 2/2 discomfort and difficulty walking and moving in bed with it in place. Comfort for RLE is too long and too large, but encouraged Pt to wear RLE device  in the evening when watching TV and during HOS  in an attempt to increase tolerance for replacement. Pt and her mother are very pleased with results thus far.     Pt will benefit from skilled therapeutic intervention in order to improve on the following deficits (Retired) Difficulty walking;Impaired flexibility;Increased edema;Decreased knowledge of precautions;Decreased skin integrity;Pain;Decreased balance;Decreased knowledge of use of  DME;Decreased scar mobility;Decreased mobility  impaired cognition, including delayed processing, decreased memory, and decreased visual spatial skills   Rehab Potential Good   Clinical Impairments Affecting Rehab Potential decreased balance, difficulty walkingL decreased L knee akle and foot range of motion 2/2 girth and decreased tissue flexibility at joints, falls risk, infection risk, impaired cognition needed for learning LE self cae   OT Frequency 3x / week   OT Duration 12 weeks   OT Treatment/Interventions Self-care/ADL training;DME and/or AE instruction;Manual lymph drainage;Patient/family education;Compression bandaging;Therapeutic exercises;Scar mobilization;Manual Therapy   OT Home Exercise Plan continue LE ther ex daily as directed  w/ parent cuing   Consulted and Agree with Plan of Care Patient          G-Codes -  08/09/15 1218    Functional Assessment Tool Used anatomical and volumetric measurements, clinical judgement, interview, medical hx   Functional Limitation Self care   Self Care Current Status [phone]) At least 60 percent but less than 80 percent impaired, limited or restricted   Self Care Goal Status (M0947) At least 20 percent but less than 40 percent impaired, limited or restricted      Problem List Patient Active Problem List   Diagnosis Date Noted  . Episodic tension-type headache, not intractable 07/22/2015  . Visit for suture removal 06/25/2015  . Hydrocephalus, adult 09/10/2014  . Glaucoma 08/22/2014  . Vision decreased 08/22/2014  . Frontal headache 08/21/2014  . Right leg swelling 02/27/2014  . Chest pain on respiration 02/27/2014  . Postural dizziness 04/26/2013  . Fatigue 04/26/2013  . Abnormality of gait 01/25/2013  . Preventative health care 05/18/2011  . Lymphedema 08/06/2006    Andrey Spearman, MS, OTR/L, [facility] 08/09/2015 12:20 PM   [place] MAIN [facility] SERVICES [address] [place],  [place], [address] Phone: [phone]   Fax:  [phone]  Name: Alison Fox MRN: [id number] Date of Birth: [birth year]

## 2015-08-12 ENCOUNTER — Ambulatory Visit: Payer: Medicare Other | Admitting: Occupational Therapy

## 2015-08-12 DIAGNOSIS — I890 Lymphedema, not elsewhere classified: Secondary | ICD-10-CM | POA: Diagnosis not present

## 2015-08-12 NOTE — Patient Instructions (Signed)
LE instructions and precautions as established- see initial eval.   

## 2015-08-12 NOTE — Therapy (Signed)
[place] MAIN [facility] SERVICES [address] [place], [place], [address] Phone: [phone]   Fax:  [phone]  Occupational Therapy Treatment  Patient Details  Name: Alison Fox MRN: [id number] Date of Birth: [birth year] No Data Recorded  Encounter Date: 08/12/2015      OT End of Session - 08/12/15 1130    Visit Number 11   Number of Visits 36   Date for OT Re-Evaluation 10/01/15   OT Start Time 1112   OT Stop Time 1219   OT Time Calculation (min) 67 min   Activity Tolerance Patient tolerated treatment well;No increased pain   Behavior During Therapy [facility] for tasks assessed/performed      Past Medical History  Diagnosis Date  . Lymphedema     Chronic following left knee surgery in 2000.  Marland Kitchen Headache     Past Surgical History  Procedure Laterality Date  . Knee arthroscopy  2000    Left knee.     There were no vitals filed for this visit.  Visit Diagnosis:  Lymphedema      Subjective Assessment - 08/12/15 1131    Subjective  Pt presents for OT Rx visit 11 for CDT to BLE w/ compression wraps in place.Pt reports she did not wear Reid Comfort HOS device on RLE as instructed. Pt did perform skin carend ther ex. "I keep neglecting my right leg."   Patient is accompained by: Family member   Pertinent History onset in 28s and positive family history for maternal grandmother and cousin suggests primary LE Tarda; arthroscopic knee sx ~ 2000?, recent fall w/ head laceration. 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 No/denies   Pain Onset More than a month ago                      OT Treatments/Exercises (OP) - 08/12/15 0001    ADLs   ADL Education Given Yes   Manual Therapy   Manual Therapy Edema management;Manual Lymphatic  Drainage (MLD);Compression Bandaging;Other (comment)   Soft tissue mobilization fibrosis technique at L ankle   Manual Lymphatic Drainage (MLD) MLD to LLE as est.   Compression Bandaging Added comprex foam kidneys to paosterir malleoli. LLE gradient compression wraps applied circumferentially in gradient configuration from toes to groin as follows: toe wrap x1 under cotton stockinett; 8 cm x 1 to foot and ankle, 10 cm x 2, then  12 cm x 2- all layered over .04 x 10 cm and 12 cm Rosidol Soft foam from A-G.   Other Manual Therapy skin care BLE                OT Education - 08/12/15 1133    Education provided Yes   Education Details Emphasis of LE self care training today was on continued instruction and demonstration for simple self MLD. Pt is learning techniques slowly and requires modeling and VC  to complete "short neck" sequence today   Person(s) Educated Patient;Parent(s)   Methods Explanation;Demonstration;Tactile cues;Verbal cues;Handout   Comprehension Verbalized understanding;Returned demonstration;Verbal cues required;Tactile cues required;Need further instruction             OT Long Term Goals - 08/09/15 1217    OT LONG TERM GOAL #1   Title Pt able to correctly  apply gradient compression wraps to below knee with moderate caregiver assistance for optimal limb volume reduction and improvement in tissue integrity.   Baseline dependent    Time 2   Period Weeks   Status Achieved   OT LONG TERM GOAL #2   Title Pt to achieve 15% limb volume reduction in LLE, and 10% reduction in RLE by DC to limit lymphedema (LE) progression and infection risk.- NEW GOAL 25%   Baseline dependent   Time 12   Status Achieved   OT LONG TERM GOAL #3   Baseline dependent   Time 12   Period Weeks   Status Partially Met   OT LONG TERM GOAL #5   Title Pt to remain infection free throughout CDT course to limit infection and LE progression.   Time 12   Period Weeks   Status Partially Met    OT LONG TERM GOAL #6   Title During Management Phase CDT Pt to sustain limb volume reductions achieved during Intensive Phase CDT within 5% utilizing LE self-care protocols, appropriate compression garments/ devices, and moderate caregiver assistance.   Time 6   Period Months   Status On-going               Plan - 08/12/15 1222    Clinical Impression Statement Pt did not utilize existing HOS device on RLE as instructed over the weekend. She did perform skin care and ther ex as instructed and tells me that she practiced simple self MLD she's learned thus far. Skin on RLE presents with palpable improvement in hydration today. LLE continues to respond well to to CDT.   Pt will benefit from skilled therapeutic intervention in order to improve on the following deficits (Retired) Difficulty walking;Impaired flexibility;Increased edema;Decreased knowledge of precautions;Decreased skin integrity;Pain;Decreased balance;Decreased knowledge of use of DME;Decreased scar mobility;Decreased mobility  impaired cognition, including delayed processing, decreased memory, and decreased visual spatial skills   Rehab Potential Good   Clinical Impairments Affecting Rehab Potential decreased balance, difficulty walkingL decreased L knee akle and foot range of motion 2/2 girth and decreased tissue flexibility at joints, falls risk, infection risk, impaired cognition needed for learning LE self cae   OT Frequency 3x / week   OT Duration 12 weeks   OT Treatment/Interventions Self-care/ADL training;DME and/or AE instruction;Manual lymph drainage;Patient/family education;Compression bandaging;Therapeutic exercises;Scar mobilization;Manual Therapy   OT Home Exercise Plan continue LE ther ex daily as directed  w/ parent cuing   Consulted and Agree with Plan of Care Patient        Problem List Patient Active Problem List   Diagnosis Date Noted  . Episodic tension-type headache, not intractable 07/22/2015  .  Visit for suture removal 06/25/2015  . Hydrocephalus, adult 09/10/2014  . Glaucoma 08/22/2014  . Vision decreased 08/22/2014  . Frontal headache 08/21/2014  . Right leg swelling 02/27/2014  . Chest pain on respiration 02/27/2014  . Postural dizziness 04/26/2013  . Fatigue 04/26/2013  . Abnormality of gait 01/25/2013  . Preventative health care 05/18/2011  . Lymphedema 08/06/2006     Andrey Spearman, MS, OTR/L, [facility] 08/12/2015 12:25 PM  [place] MAIN [facility] SERVICES [address] [place], [place], [address] Phone: [phone]   Fax:  [phone]  Name: Chelle Cayton MRN: [id number] Date of Birth: 01-26-1971

## 2015-08-14 ENCOUNTER — Ambulatory Visit: Payer: Medicare Other | Admitting: Occupational Therapy

## 2015-08-14 DIAGNOSIS — I890 Lymphedema, not elsewhere classified: Secondary | ICD-10-CM | POA: Diagnosis not present

## 2015-08-14 NOTE — Therapy (Signed)
[place] [facility] MAIN [facility] SERVICES [address] [place], [place], [address] Phone: [phone]   Fax:  [phone]  Occupational Therapy Treatment  Patient Details  Name: Alison Fox MRN: [id number] Date of Birth: [birth year] No Data Recorded  Encounter Date: 08/14/2015      OT End of Session - 08/14/15 1210    Visit Number 12   Number of Visits 36   Date for OT Re-Evaluation 10/01/15   OT Start Time 1112   OT Stop Time 1208   OT Time Calculation (min) 56 min   Activity Tolerance Patient tolerated treatment well;No increased pain   Behavior During Therapy [facility] for tasks assessed/performed      Past Medical History  Diagnosis Date  . Lymphedema     Chronic following left knee surgery in 2000.  Marland Kitchen Headache     Past Surgical History  Procedure Laterality Date  . Knee arthroscopy  2000    Left knee.     There were no vitals filed for this visit.  Visit Diagnosis:  Lymphedema      Subjective Assessment - 08/14/15 1125    Subjective  Pt presents for OT Rx visit 12 for CDT to BLE w/ compression wraps in place.Pt reporting "sore toes" again today on L foot.   Patient is accompained by: Family member   Pertinent History onset in 8s and positive family history for maternal grandmother and cousin suggests primary LE Tarda; arthroscopic knee sx ~ 2000?, recent fall w/ head laceration. 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 Yes   Pain Score 9    Pain Location Toe (Comment which one)  digits 2,3,4   Pain Orientation Left   Pain Descriptors / Indicators Sore   Pain Onset In the past 7 days   Pain Frequency Intermittent   Aggravating Factors  compression bandaging                      OT Treatments/Exercises (OP) - 08/14/15  0001    Manual Therapy   Manual Therapy Edema management;Manual Lymphatic Drainage (MLD);Compression Bandaging;Other (comment)   Soft tissue mobilization fibrosis technique at L ankle   Manual Lymphatic Drainage (MLD) MLD to LLE as est.   Compression Bandaging Added comprex foam kidneys to paosterir malleoli. LLE gradient compression wraps applied circumferentially in gradient configuration from toes to groin as follows: toe wrap x1 under cotton stockinett; 8 cm x 1 to foot and ankle, 10 cm x 2, then  12 cm x 2- all layered over .04 x 10 cm and 12 cm Rosidol Soft foam from A-G.   Other Manual Therapy skin care BLE                OT Education - 08/14/15 1209    Education provided Yes   Education Details Cont edu for LE self care w/ emphasis on training for simple self MLD again today.   Person(s) Educated Patient;Parent(s)   Methods Tactile cues;Demonstration;Explanation;Verbal cues   Comprehension Verbalized understanding;Returned demonstration;Verbal cues required;Tactile cues required;Need further instruction             OT Long Term Goals - 08/14/15 1218    OT LONG TERM GOAL #1   Title Pt able to correctly apply gradient compression wraps  to below knee with moderate caregiver assistance for optimal limb volume reduction and improvement in tissue integrity.   Baseline dependent    Time 2   Status Achieved   OT LONG TERM GOAL #2   Title Pt to achieve 15% limb volume reduction in LLE, and 10% reduction in RLE by DC to limit lymphedema (LE) progression and infection risk.- NEW GOAL 25%   Baseline dependent   Time 12   Period Weeks   Status Achieved   OT LONG TERM GOAL #3   Title Pt > 75% compliant with all daily LE self-care protocols w/ moderate caregiver assistance, including simple self-manual lymphatic drainage (MLD), skin care, lymphatic pumping therex, and donning/ doffing progression garments.   Baseline dependent   Time 12   Status Achieved   OT LONG TERM GOAL  #5   Title Pt to remain infection free throughout CDT course to limit infection and LE progression.   Baseline dependent   Period Weeks   Status On-going   OT LONG TERM GOAL #6   Title During Management Phase CDT Pt to sustain limb volume reductions achieved during Intensive Phase CDT within 5% utilizing LE self-care protocols, appropriate compression garments/ devices, and moderate caregiver assistance.   Baseline dependent   Time 6   Period Months   Status On-going               Plan - 2015-09-06 1210    Clinical Impression Statement Pt demonstrated ongoing and steady progress towards all OT goals for CDT. Caregiver is independent w/ compression wrapping using correct techniques and PT tolerates w/out difficulty. continues to work on habituation of all LE self care protocols. Compliance continues to improve slowly w/ all protocols learned  thus far. Caregiver is expert at applying wraps and cues Pt's to perform self care consistently. Limb volume  and tissue density are significantly improved. LLE skin hydrattion is now approaching WNL; redness and tenderness are markedly decreased,. Pt demonstrates improved body image  compared w/ initial eval.    Pt will benefit from skilled therapeutic intervention in order to improve on the following deficits (Retired) Difficulty walking;Impaired flexibility;Increased edema;Decreased knowledge of precautions;Decreased skin integrity;Pain;Decreased balance;Decreased knowledge of use of DME;Decreased scar mobility;Decreased mobility  impaired cognition, including delayed processing, decreased memory, and decreased visual spatial skills   Rehab Potential Good   Clinical Impairments Affecting Rehab Potential decreased balance, difficulty walkingL decreased L knee akle and foot range of motion 2/2 girth and decreased tissue flexibility at joints, falls risk, infection risk, impaired cognition needed for learning LE self cae   OT Frequency 3x / week   OT  Duration 12 weeks   OT Treatment/Interventions Self-care/ADL training;DME and/or AE instruction;Manual lymph drainage;Patient/family education;Compression bandaging;Therapeutic exercises;Scar mobilization;Manual Therapy   OT Home Exercise Plan continue LE ther ex daily as directed  w/ parent cuing   Consulted and Agree with Plan of Care Patient          G-Codes - 09/06/15 1215    Functional Assessment Tool Used anatomical and volumetric measurements, clinical judgement, interview, medical hx   Functional Limitation Self care   Self Care Current Status (Z6109) At least 40 percent but less than 60 percent impaired, limited or restricted   Self Care Goal Status (U0454) At least 20 percent but less than 40 percent impaired, limited or restricted      Problem List Patient Active Problem List   Diagnosis Date Noted  . Episodic tension-type headache, not intractable 07/22/2015  . Visit  for suture removal 06/25/2015  . Hydrocephalus, adult 09/10/2014  . Glaucoma 08/22/2014  . Vision decreased 08/22/2014  . Frontal headache 08/21/2014  . Right leg swelling 02/27/2014  . Chest pain on respiration 02/27/2014  . Postural dizziness 04/26/2013  . Fatigue 04/26/2013  . Abnormality of gait 01/25/2013  . Preventative health care 05/18/2011  . Lymphedema 08/06/2006   Loel Dubonnetheresa Linnae Rasool, MS, OTR/L, CLT-LANA 08/14/2015 12:20 PM  [place] [facility] [address] [place], [place], [address] Phone: [phone]   Fax:  [phone]  Name: Merrilee SeashoreFrances Remmel MRN: [id number] Date of Birth: November 05, 1970

## 2015-08-14 NOTE — Patient Instructions (Signed)
LE instructions and precautions as established- see initial eval.   

## 2015-08-16 ENCOUNTER — Ambulatory Visit: Payer: Medicare Other | Admitting: Occupational Therapy

## 2015-08-16 DIAGNOSIS — I890 Lymphedema, not elsewhere classified: Secondary | ICD-10-CM

## 2015-08-16 NOTE — Patient Instructions (Signed)
As established 

## 2015-08-16 NOTE — Therapy (Signed)
[place] [facility] [address] [place], [place], [address] Phone: [phone]   Fax:  [phone]  Occupational Therapy Treatment  Patient Details  Name: Alison SeashoreFrances Fox MRN: [id number] Date of Birth: 1970-11-30 No Data Recorded  Encounter Date: 08/16/2015      OT End of Session - 08/16/15 1151    Visit Number 13   Number of Visits 36   Date for OT Re-Evaluation 10/01/15   OT Start Time 1105   OT Stop Time 1220   OT Time Calculation (min) 75 min   Activity Tolerance Patient tolerated treatment well;No increased pain   Behavior During Therapy [facility] for tasks assessed/performed      Past Medical History  Diagnosis Date  . Lymphedema     Chronic following left knee surgery in 2000.  Marland Kitchen. Headache     Past Surgical History  Procedure Laterality Date  . Knee arthroscopy  2000    Left knee.     There were no vitals filed for this visit.  Visit Diagnosis:  Lymphedema      Subjective Assessment - 08/16/15 1145    Subjective  Pt presents for OT Rx visit 13 for CDT to BLE w/ compression wraps in place.Stormy FabianCarolyn Alexander here to assist w/ measurements for custom compression garments and devices for HOS.   Patient is accompained by: Family member   Pertinent History onset in 5620s and positive family history for maternal grandmother and cousin suggests primary LE Tarda; arthroscopic knee sx ~ 2000?, recent fall w/ head laceration. 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 No/denies                      OT Treatments/Exercises (OP) - 08/16/15 0001    ADLs   ADL Education Given Yes   Manual Therapy   Manual Therapy Edema management;Compression Bandaging;Other (comment)  Anatomical measurements of BLE for custom compression    Compression Bandaging Added comprex foam kidneys to paosterir malleoli. LLE gradient compression wraps applied circumferentially in gradient configuration from toes to groin as follows: toe wrap x1 under cotton stockinett; 8 cm x 1 to foot and ankle, 10 cm x 2, then  12 cm x 2- all layered over .04 x 10 cm and 12 cm Rosidol Soft foam from A-G.                OT Education - 08/16/15 1149    Education provided Yes   Education Details Pt and caregiver edu for compression garment specifications and recommendations   Person(s) Educated Patient   Methods Explanation   Comprehension Verbalized understanding             OT Long Term Goals - 08/14/15 1218    OT LONG TERM GOAL #1   Title Pt able to correctly apply gradient compression wraps to below knee with moderate caregiver assistance for optimal limb volume reduction and improvement in tissue integrity.   Baseline dependent    Time 2   Status Achieved   OT LONG TERM GOAL #2   Title Pt to achieve 15% limb volume reduction in LLE, and 10% reduction in RLE by DC to limit lymphedema (LE) progression and infection risk.- NEW GOAL 25%   Baseline dependent   Time  12   Period Weeks   Status Achieved   OT LONG TERM GOAL #3   Title Pt > 75% compliant with all daily LE self-care protocols w/ moderate caregiver assistance, including simple self-manual lymphatic drainage (MLD), skin care, lymphatic pumping therex, and donning/ doffing progression garments.   Baseline dependent   Time 12   Status Achieved   OT LONG TERM GOAL #5   Title Pt to remain infection free throughout CDT course to limit infection and LE progression.   Baseline dependent   Period Weeks   Status On-going   OT LONG TERM GOAL #6   Title During Management Phase CDT Pt to sustain limb volume reductions achieved during Intensive Phase CDT within 5% utilizing LE self-care protocols, appropriate compression garments/ devices, and moderate caregiver assistance.    Baseline dependent   Time 6   Period Months   Status On-going               Plan - 08/16/15 1151    Clinical Impression Statement Pt continues to make progress towards all OT goals for LE care. . Pt tolerated extensive anatomical measurent for BLE custom day and night time compression garments and devices.    Pt will benefit from skilled therapeutic intervention in order to improve on the following deficits (Retired) Difficulty walking;Impaired flexibility;Increased edema;Decreased knowledge of precautions;Decreased skin integrity;Pain;Decreased balance;Decreased knowledge of use of DME;Decreased scar mobility;Decreased mobility  impaired cognition, including delayed processing, decreased memory, and decreased visual spatial skills   Rehab Potential Good   Clinical Impairments Affecting Rehab Potential decreased balance, difficulty walkingL decreased L knee akle and foot range of motion 2/2 girth and decreased tissue flexibility at joints, falls risk, infection risk, impaired cognition needed for learning LE self cae   OT Frequency 3x / week   OT Duration 12 weeks   OT Treatment/Interventions Self-care/ADL training;DME and/or AE instruction;Manual lymph drainage;Patient/family education;Compression bandaging;Therapeutic exercises;Scar mobilization;Manual Therapy   OT Home Exercise Plan continue LE ther ex daily as directed  w/ parent cuing   Consulted and Agree with Plan of Care Patient        Problem List Patient Active Problem List   Diagnosis Date Noted  . Episodic tension-type headache, not intractable 07/22/2015  . Visit for suture removal 06/25/2015  . Hydrocephalus, adult 09/10/2014  . Glaucoma 08/22/2014  . Vision decreased 08/22/2014  . Frontal headache 08/21/2014  . Right leg swelling 02/27/2014  . Chest pain on respiration 02/27/2014  . Postural dizziness 04/26/2013  . Fatigue 04/26/2013  . Abnormality of gait 01/25/2013  . Preventative health care 05/18/2011   . Lymphedema 08/06/2006   Loel Dubonnet, MS, OTR/L, [facility] 08/16/2015 12:23 PM \  [place] [facility] MAIN [facility] SERVICES [address] [place], [place], [address] Phone: [phone]   Fax:  [phone]  Name: Alison Fox MRN: [id number] Date of Birth: Aug 03, 1971

## 2015-08-19 ENCOUNTER — Ambulatory Visit: Payer: Medicare Other | Admitting: Occupational Therapy

## 2015-08-19 DIAGNOSIS — I890 Lymphedema, not elsewhere classified: Secondary | ICD-10-CM | POA: Diagnosis not present

## 2015-08-19 NOTE — Therapy (Signed)
[place] [facility] [address] [place], [place], [address] Phone: [phone]   Fax:  [phone]  Occupational Therapy Treatment  Patient Details  Name: Alison Fox MRN: [id number] Date of Birth: December 17, 1970 No Data Recorded  Encounter Date: 08/19/2015      OT End of Session - 08/19/15 1214    Visit Number 14   Number of Visits 36   Date for OT Re-Evaluation 10/01/15   OT Start Time 1108   OT Stop Time 1212   OT Time Calculation (min) 64 min   Activity Tolerance Patient tolerated treatment well;No increased pain   Behavior During Therapy [facility] for tasks assessed/performed      Past Medical History  Diagnosis Date  . Lymphedema     Chronic following left knee surgery in 2000.  Marland Kitchen. Headache     Past Surgical History  Procedure Laterality Date  . Knee arthroscopy  2000    Left knee.     There were no vitals filed for this visit.  Visit Diagnosis:  Lymphedema      Subjective Assessment - 08/19/15 1118    Subjective  Pt presents for OT Rx visit 14 for CDT to BLE w/ compression wraps in place.Pt arrives wearing new tennis shoes today, as recommended last visit.   Patient is accompained by: Family member   Pertinent History onset in 5220s and positive family history for maternal grandmother and cousin suggests primary LE Tarda; arthroscopic knee sx ~ 2000?, recent fall w/ head laceration. 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 No/denies   Pain Onset In the past 7 days                      OT Treatments/Exercises (OP) - 08/19/15 0001    Manual Therapy   Manual Therapy Edema management;Manual Lymphatic Drainage (MLD);Compression Bandaging;Other (comment)  skin care   Soft tissue mobilization fibrosis technique  at L ankle   Manual Lymphatic Drainage (MLD) MLD to LLE as est.   Compression Bandaging No toe wraps today- used cowrap to lessen bulk to improve comfort..Added comprex foam kidneys to paosterir malleoli. LLE gradient compression wraps applied circumferentially in gradient configuration from toes to groin as follows: toe wrap x1 under cotton stockinett; 8 cm x 1 to foot and ankle, 10 cm x 2, then  12 cm x 2- all layered over .04 x 10 cm and 12 cm Rosidol Soft foam from A-G.                OT Education - 08/19/15 1207    Education provided Yes   Education Details Continued skilled Pt/caregiver Education  And LE ADL training throughout visit for lymphedema self care components, including compression wrapping, compression garment and device wear/care, lymphatic pumping ther ex, simple self-MLD, and skin care.    Person(s) Educated Patient;Parent(s)   Methods Explanation   Comprehension Verbalized understanding;Need further instruction             OT Long Term Goals - 08/14/15 1218    OT LONG TERM GOAL #1   Title Pt able to correctly apply gradient compression wraps to below knee with moderate caregiver assistance for optimal limb volume reduction and improvement in tissue integrity.   Baseline dependent  Time 2   Status Achieved   OT LONG TERM GOAL #2   Title Pt to achieve 15% limb volume reduction in LLE, and 10% reduction in RLE by DC to limit lymphedema (LE) progression and infection risk.- NEW GOAL 25%   Baseline dependent   Time 12   Period Weeks   Status Achieved   OT LONG TERM GOAL #3   Title Pt > 75% compliant with all daily LE self-care protocols w/ moderate caregiver assistance, including simple self-manual lymphatic drainage (MLD), skin care, lymphatic pumping therex, and donning/ doffing progression garments.   Baseline dependent   Time 12   Status Achieved   OT LONG TERM GOAL #5   Title Pt to remain infection free throughout CDT course to limit infection and  LE progression.   Baseline dependent   Period Weeks   Status On-going   OT LONG TERM GOAL #6   Title During Management Phase CDT Pt to sustain limb volume reductions achieved during Intensive Phase CDT within 5% utilizing LE self-care protocols, appropriate compression garments/ devices, and moderate caregiver assistance.   Baseline dependent   Time 6   Period Months   Status On-going               Plan - 08/19/15 1208    Clinical Impression Statement Pt reports she practiced simple self MLD over the weekend, and also performed skin care and ther ex as directed. Mother is doing an excellent job w/ wraps. LLE is sllightly more congested and dense today. Pt needs additional instruction to master MLD.   Pt will benefit from skilled therapeutic intervention in order to improve on the following deficits (Retired) Difficulty walking;Impaired flexibility;Increased edema;Decreased knowledge of precautions;Decreased skin integrity;Pain;Decreased balance;Decreased knowledge of use of DME;Decreased scar mobility;Decreased mobility  impaired cognition, including delayed processing, decreased memory, and decreased visual spatial skills   Rehab Potential Good   Clinical Impairments Affecting Rehab Potential decreased balance, difficulty walkingL decreased L knee akle and foot range of motion 2/2 girth and decreased tissue flexibility at joints, falls risk, infection risk, impaired cognition needed for learning LE self cae   OT Frequency 3x / week   OT Duration 12 weeks   OT Treatment/Interventions Self-care/ADL training;DME and/or AE instruction;Manual lymph drainage;Patient/family education;Compression bandaging;Therapeutic exercises;Scar mobilization;Manual Therapy   OT Home Exercise Plan continue LE ther ex daily as directed  w/ parent cuing   Consulted and Agree with Plan of Care Patient        Problem List Patient Active Problem List   Diagnosis Date Noted  . Episodic tension-type  headache, not intractable 07/22/2015  . Visit for suture removal 06/25/2015  . Hydrocephalus, adult 09/10/2014  . Glaucoma 08/22/2014  . Vision decreased 08/22/2014  . Frontal headache 08/21/2014  . Right leg swelling 02/27/2014  . Chest pain on respiration 02/27/2014  . Postural dizziness 04/26/2013  . Fatigue 04/26/2013  . Abnormality of gait 01/25/2013  . Preventative health care 05/18/2011  . Lymphedema 08/06/2006    Loel Dubonnet, MS, OTR/L, [facility] 08/19/2015 12:17 PM   [place] [facility] MAIN [facility] SERVICES [address] [place], [place], [address] Phone: [phone]   Fax:  [phone]  Name: Alison Fox MRN: [id number] Date of Birth: 1970-11-20

## 2015-08-19 NOTE — Patient Instructions (Signed)
LE instructions and precautions as established- see initial eval.   

## 2015-08-21 ENCOUNTER — Ambulatory Visit: Payer: Medicare Other | Attending: Internal Medicine | Admitting: Occupational Therapy

## 2015-08-21 DIAGNOSIS — I890 Lymphedema, not elsewhere classified: Secondary | ICD-10-CM | POA: Insufficient documentation

## 2015-08-21 NOTE — Patient Instructions (Addendum)
1.[facility] to limit progression of suspected RLE venous stasis ulcer.  Consider very soft , well fitting, comfortable off-the shelf, athletic style,  15-18 mmHg  Compression sock on RLE for work while awaiting wound ctr visit.  2. Elevate BLE at level of the heart when seated.  3. LE instructions and precautions as established- see initial eval.

## 2015-08-21 NOTE — Therapy (Signed)
[place] [facility] [address] [place], [place], [address] Phone: [phone]   Fax:  [phone]  Occupational Therapy Treatment  Patient Details  Name: Alison Fox MRN: [id number] Date of Birth: 1971/04/24 No Data Recorded  Encounter Date: 08/21/2015      OT End of Session - 08/21/15 1702    Visit Number 15   Number of Visits 36   Date for OT Re-Evaluation 10/01/15   OT Start Time 1115   OT Stop Time 1231   OT Time Calculation (min) 76 min   Activity Tolerance Patient tolerated treatment well;No increased pain   Behavior During Therapy [facility] for tasks assessed/performed      Past Medical History  Diagnosis Date  . Lymphedema     Chronic following left knee surgery in 2000.  Marland Kitchen. Headache     Past Surgical History  Procedure Laterality Date  . Knee arthroscopy  2000    Left knee.     There were no vitals filed for this visit.  Visit Diagnosis:  Lymphedema      Subjective Assessment - 08/21/15 1648    Subjective  Pt presents for OT Rx visit 15 for CDT to BLE w/ LLE compression wraps in place.Pt reports she was able to perform all LLE LE self care protocols as directed during visit interval. Pt has new c/o increased point "soreness" at medial R leg today. "It's really sore near where I hurt that leg  at work before.   Patient is accompained by: Family member   Pertinent History onset in 4720s and positive family history for maternal grandmother and cousin suggests primary LE Tarda; arthroscopic knee sx ~ 2000?, recent fall w/ head laceration. 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 Yes   Pain Score 7    Pain Location Leg   Pain Orientation Right   Pain Descriptors / Indicators Sore;Tender   Pain Type Acute pain   Pain  Onset In the past 7 days   Pain Frequency Intermittent   Aggravating Factors  dependent positioning                      OT Treatments/Exercises (OP) - 08/21/15 0001    ADLs   ADL Education Given Yes   Manual Therapy   Manual Therapy Edema management;Manual Lymphatic Drainage (MLD);Compression Bandaging;Other (comment)  skin care to BLE w/ low pH Eucerin lotion , and during MLD w   Manual therapy comments Point tenderness to R distal medial leg w/ skin care an palpation. Venous ulcer appears to be forming in the area of previous  trauma. No open wound yet, but skin is reddened under the skin and warm to touch in keeping w/ inflamation   Soft tissue mobilization fibrosis technique at L medial and lateral malleoli   Manual Lymphatic Drainage (MLD) MLD to LLE as est.   Compression Bandaging No toe wraps today- used cowrap to lessen bulk to improve comfort..Added comprex foam kidneys to paosterir malleoli. LLE gradient compression wraps applied circumferentially in gradient configuration from toes to groin as follows: toe wrap x1 under cotton stockinett; 8 cm x 1 to foot and ankle, 10 cm x 2, then  12 cm x 2- all layered over .04 x 10 cm and  12 cm Rosidol Soft foam from A-G.                OT Education - 08/21/15 1701    Education provided Yes   Education Details Continued skilled Pt/caregiver Education  And LE ADL training throughout visit for lymphedema self care components, including compression wrapping, compression garment and device wear/care, lymphatic pumping ther ex, simple self-MLD, and skin care.    Person(s) Educated Patient;Parent(s)   Methods Explanation   Comprehension Verbalized understanding;Need further instruction             OT Long Term Goals - 08/14/15 1218    OT LONG TERM GOAL #1   Title Pt able to correctly apply gradient compression wraps to below knee with moderate caregiver assistance for optimal limb volume reduction and improvement in  tissue integrity.   Baseline dependent    Time 2   Status Achieved   OT LONG TERM GOAL #2   Title Pt to achieve 15% limb volume reduction in LLE, and 10% reduction in RLE by DC to limit lymphedema (LE) progression and infection risk.- NEW GOAL 25%   Baseline dependent   Time 12   Period Weeks   Status Achieved   OT LONG TERM GOAL #3   Title Pt > 75% compliant with all daily LE self-care protocols w/ moderate caregiver assistance, including simple self-manual lymphatic drainage (MLD), skin care, lymphatic pumping therex, and donning/ doffing progression garments.   Baseline dependent   Time 12   Status Achieved   OT LONG TERM GOAL #5   Title Pt to remain infection free throughout CDT course to limit infection and LE progression.   Baseline dependent   Period Weeks   Status On-going   OT LONG TERM GOAL #6   Title During Management Phase CDT Pt to sustain limb volume reductions achieved during Intensive Phase CDT within 5% utilizing LE self-care protocols, appropriate compression garments/ devices, and moderate caregiver assistance.   Baseline dependent   Time 6   Period Months   Status On-going               Plan - 08/21/15 1703    Clinical Impression Statement Pt presentinmg w/ increasing swelling, pain/tenderness, and redness at R medial distal leg since last visit and again today. Pt will benefit from medical wound  evaluation to limit development of suspected venous stasis ulcer in area of previous trauma. LLE lymphedema continues to improve and Pt's compliance w/ HP  continues to improve during visit intervals. Advises Pt to elevate legs when seated, perform BLE skin care religeously, and consider off the shelf, soft, knee high compression sock to RLE , especially for standing at work, until she sees MD at wound care ctr late next week.    Pt will benefit from skilled therapeutic intervention in order to improve on the following deficits (Retired) Difficulty walking;Impaired  flexibility;Increased edema;Decreased knowledge of precautions;Decreased skin integrity;Pain;Decreased balance;Decreased knowledge of use of DME;Decreased scar mobility;Decreased mobility  impaired cognition, including delayed processing, decreased memory, and decreased visual spatial skills   Rehab Potential Good   Clinical Impairments Affecting Rehab Potential decreased balance, difficulty walkingL decreased L knee akle and foot range of motion 2/2 girth and decreased tissue flexibility at joints, falls risk, infection risk, impaired cognition needed for learning LE self cae   OT Frequency 3x / week   OT Duration 12 weeks   OT Treatment/Interventions Self-care/ADL training;DME and/or AE instruction;Manual lymph drainage;Patient/family education;Compression bandaging;Therapeutic exercises;Scar mobilization;Manual Therapy  OT Home Exercise Plan continue LE ther ex daily as directed  w/ parent cuing   Consulted and Agree with Plan of Care Patient        Problem List Patient Active Problem List   Diagnosis Date Noted  . Episodic tension-type headache, not intractable 07/22/2015  . Visit for suture removal 06/25/2015  . Hydrocephalus, adult 09/10/2014  . Glaucoma 08/22/2014  . Vision decreased 08/22/2014  . Frontal headache 08/21/2014  . Right leg swelling 02/27/2014  . Chest pain on respiration 02/27/2014  . Postural dizziness 04/26/2013  . Fatigue 04/26/2013  . Abnormality of gait 01/25/2013  . Preventative health care 05/18/2011  . Lymphedema 08/06/2006    Loel Dubonnet, MS, OTR/L, [facility] 08/21/2015 5:12 PM   [place] [facility] MAIN [facility] SERVICES [address] [place], [place], [address] Phone: [phone]   Fax:  [phone]  Name: Alison Fox MRN: [id number] Date of Birth: 03-06-1971

## 2015-08-23 ENCOUNTER — Ambulatory Visit: Payer: Medicare Other | Admitting: Occupational Therapy

## 2015-08-23 DIAGNOSIS — I890 Lymphedema, not elsewhere classified: Secondary | ICD-10-CM | POA: Diagnosis not present

## 2015-08-23 NOTE — Therapy (Signed)
[place] [facility] MAIN [facility] SERVICES [address] [place], [place], [address] Phone: [phone]   Fax:  [phone]  Occupational Therapy Treatment  Patient Details  Name: Alison Fox MRN: [id number] Date of Birth: 10/02/1971 No Data Recorded  Encounter Date: 08/23/2015      OT End of Session - 08/23/15 1216    Visit Number 16   Number of Visits 36   Date for OT Re-Evaluation 10/01/15   OT Start Time 1110   OT Stop Time 1216   OT Time Calculation (min) 66 min   Activity Tolerance Patient tolerated treatment well;No increased pain   Behavior During Therapy [facility] for tasks assessed/performed      Past Medical History  Diagnosis Date  . Lymphedema     Chronic following left knee surgery in 2000.  Marland Kitchen Headache     Past Surgical History  Procedure Laterality Date  . Knee arthroscopy  2000    Left knee.     There were no vitals filed for this visit.  Visit Diagnosis:  Lymphedema      Subjective Assessment - 08/23/15 1122    Subjective  Pt presents for OT Rx visit 16 for CDT to BLE w/ LLE compression wraps in place.Pt reports soreness in toes and back are very sore today, and medial R leg pain is unchanged since last visit.   Patient is accompained by: Family member   Pertinent History onset in 18s and positive family history for maternal grandmother and cousin suggests primary LE Tarda; arthroscopic knee sx ~ 2000?, recent fall w/ head laceration. 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)   Limitations difficulty walking, decreased balance, suspected BLE suspected, R>L,     Patient Stated Goals get the swelling down and keep it down   Pain Score 7    Pain Location Leg   Pain Orientation Right;Medial   Pain Descriptors / Indicators Sore;Tender   Pain Onset In the past 7 days   Pain Frequency Intermittent                      OT Treatments/Exercises (OP) - 08/23/15 0001    ADLs   ADL  Education Given Yes   Manual Therapy   Manual Therapy Edema management;Manual Lymphatic Drainage (MLD);Compression Bandaging;Other (comment)   Manual therapy comments Point tenderness to R distal medial leg w/ skin care an palpation. Venous ulcer appears to be forming in the area of previous  trauma. No open wound yet, but skin is reddened under the skin and warm to touch in keeping w/ inflamation   Soft tissue mobilization fibrosis technique at L medial and lateral malleoli   Manual Lymphatic Drainage (MLD) MLD to LLE as est.   Compression Bandaging No toe wraps today- used cowrap to lessen bulk to improve comfort..Added comprex foam kidneys to paosterir malleoli. LLE gradient compression wraps applied circumferentially in gradient configuration from toes to groin as follows: toe wrap x1 under cotton stockinett; 8 cm x 1 to foot and ankle, 10 cm x 2, then  12 cm x 2- all layered over .04 x 10 cm and 12 cm Rosidol Soft foam from A-G.                OT Education - 08/23/15 1214    Education provided Yes   Education Details Continued skilled Pt/caregiver Education  And LE ADL training throughout visit for lymphedema self care components, including compression wrapping,  compression garment and device wear/care, lymphatic pumping ther ex, simple self-MLD, and skin care.    Person(s) Educated Patient   Methods Explanation   Comprehension Verbalized understanding;Need further instruction             OT Long Term Goals - 08/14/15 1218    OT LONG TERM GOAL #1   Title Pt able to correctly apply gradient compression wraps to below knee with moderate caregiver assistance for optimal limb volume reduction and improvement in tissue integrity.   Baseline dependent    Time 2   Status Achieved   OT LONG TERM GOAL #2   Title Pt to achieve 15% limb volume reduction in LLE, and 10% reduction in RLE by DC to limit lymphedema (LE) progression and infection risk.- NEW GOAL 25%   Baseline dependent    Time 12   Period Weeks   Status Achieved   OT LONG TERM GOAL #3   Title Pt > 75% compliant with all daily LE self-care protocols w/ moderate caregiver assistance, including simple self-manual lymphatic drainage (MLD), skin care, lymphatic pumping therex, and donning/ doffing progression garments.   Baseline dependent   Time 12   Status Achieved   OT LONG TERM GOAL #5   Title Pt to remain infection free throughout CDT course to limit infection and LE progression.   Baseline dependent   Period Weeks   Status On-going   OT LONG TERM GOAL #6   Title During Management Phase CDT Pt to sustain limb volume reductions achieved during Intensive Phase CDT within 5% utilizing LE self-care protocols, appropriate compression garments/ devices, and moderate caregiver assistance.   Baseline dependent   Time 6   Period Months   Status On-going               Plan - 08/23/15 1217    Clinical Impression Statement Swelling, tenderness and skin discoloration at R medial leg appears unchanged today. LLE continues to respond very well to CDT. Pt continues to perform LE self care during visit interval with her mother's assistance.   Pt will benefit from skilled therapeutic intervention in order to improve on the following deficits (Retired) Difficulty walking;Impaired flexibility;Increased edema;Decreased knowledge of precautions;Decreased skin integrity;Pain;Decreased balance;Decreased knowledge of use of DME;Decreased scar mobility;Decreased mobility  impaired cognition, including delayed processing, decreased memory, and decreased visual spatial skills   Rehab Potential Good   Clinical Impairments Affecting Rehab Potential decreased balance, difficulty walkingL decreased L knee akle and foot range of motion 2/2 girth and decreased tissue flexibility at joints, falls risk, infection risk, impaired cognition needed for learning LE self cae   OT Frequency 3x / week   OT Duration 12 weeks   OT  Treatment/Interventions Self-care/ADL training;DME and/or AE instruction;Manual lymph drainage;Patient/family education;Compression bandaging;Therapeutic exercises;Scar mobilization;Manual Therapy   OT Home Exercise Plan continue LE ther ex daily as directed  w/ parent cuing   Consulted and Agree with Plan of Care Patient        Problem List Patient Active Problem List   Diagnosis Date Noted  . Episodic tension-type headache, not intractable 07/22/2015  . Visit for suture removal 06/25/2015  . Hydrocephalus, adult 09/10/2014  . Glaucoma 08/22/2014  . Vision decreased 08/22/2014  . Frontal headache 08/21/2014  . Right leg swelling 02/27/2014  . Chest pain on respiration 02/27/2014  . Postural dizziness 04/26/2013  . Fatigue 04/26/2013  . Abnormality of gait 01/25/2013  . Preventative health care 05/18/2011  . Lymphedema 08/06/2006    Loel Dubonnetheresa Heela Heishman,  MS, OTR/L, CLT-LANA 08/23/2015 12:20 PM   [place] [facility] MAIN [facility] SERVICES [address] [place], [place], [address] Phone: [phone]   Fax:  [phone]  Name: Alison Fox MRN: [id number] Date of Birth: June 24, 1971

## 2015-08-26 ENCOUNTER — Ambulatory Visit: Payer: Medicare Other | Admitting: Occupational Therapy

## 2015-08-27 ENCOUNTER — Ambulatory Visit: Payer: Medicare Other | Admitting: Occupational Therapy

## 2015-08-27 DIAGNOSIS — I890 Lymphedema, not elsewhere classified: Secondary | ICD-10-CM | POA: Diagnosis not present

## 2015-08-27 NOTE — Patient Instructions (Signed)
LE instructions and precautions as established- see initial eval.   

## 2015-08-27 NOTE — Therapy (Signed)
[place] [facility]IN [facility] [address] [place], [place], [address] Phone: [phone]   Fax:  [phone]  Occupational Therapy Treatment  Patient Details  Name: Alison Fox MRN: [id number] Date of Birth: [birth year] No Data Recorded  Encounter Date: 08/27/2015      OT End of Session - 08/27/15 1125    Visit Number 17   Number of Visits 36   Date for OT Re-Evaluation 10/01/15   OT Start Time 1120   OT Stop Time 1218   OT Time Calculation (min) 58 min   Activity Tolerance Patient tolerated treatment well;No increased pain   Behavior [facility] tasks assessed/performed      Past Medical History  Diagnosis Date  . Lymphedema     Chronic following left knee surgery in 2000.  Marland Kitchen. Headache     Past Surgical History  Procedure Laterality Date  . Knee arthroscopy  2000    Left knee.     There were no vitals filed for this visit.  Visit Diagnosis:  Lymphedema      Subjective Assessment - 08/27/15 1123    Subjective  Pt presents for OT Rx visit 17 for CDT to BLE w/ LLE compression wraps in place.Pt reports continued  soreness in LLE toes and medial R leg.   Patient is accompained by: Family member   Pertinent History onset in 420s and positive family history for maternal grandmother and cousin suggests primary LE Tarda; arthroscopic knee sx ~ 2000?, recent fall w/ head laceration. 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 No/denies   Pain Onset In the past 7 days                      OT Treatments/Exercises (OP) - 08/27/15 0001    ADLs   ADL Education Given Yes   Manual Therapy   Manual Therapy Edema management;Manual Lymphatic Drainage (MLD);Compression Bandaging;Other (comment)   Manual therapy comments  Point tenderness to R distal medial leg w/ skin care an palpation. Venous ulcer appears to be forming in the area of previous  trauma. No open wound yet, but skin is reddened under the skin and warm to touch in keeping w/ inflamation   Soft tissue mobilization fibrosis technique at L medial and lateral malleoli   Manual Lymphatic Drainage (MLD) MLD to LLE as est.   Compression Bandaging No toe wraps today- used cowrap to lessen bulk to improve comfort..Added comprex foam kidneys to paosterir malleoli. LLE gradient compression wraps applied circumferentially in gradient configuration from toes to groin as follows: toe wrap x1 under cotton stockinett; 8 cm x 1 to foot and ankle, 10 cm x 2, then  12 cm x 2- all layered over .04 x 10 cm and 12 cm Rosidol Soft foam from A-G.                OT Education - 08/27/15 1132    Education provided Yes             OT Long Term Goals - 08/14/15 1218    OT LONG TERM GOAL #1   Title Pt able to correctly apply gradient compression wraps to below knee with moderate caregiver assistance for optimal limb volume reduction and improvement in tissue  integrity.   Baseline dependent    Time 2   Status Achieved   OT LONG TERM GOAL #2   Title Pt to achieve 15% limb volume reduction in LLE, and 10% reduction in RLE by DC to limit lymphedema (LE) progression and infection risk.- NEW GOAL 25%   Baseline dependent   Time 12   Period Weeks   Status Achieved   OT LONG TERM GOAL #3   Title Pt > 75% compliant with all daily LE self-care protocols w/ moderate caregiver assistance, including simple self-manual lymphatic drainage (MLD), skin care, lymphatic pumping therex, and donning/ doffing progression garments.   Baseline dependent   Time 12   Status Achieved   OT LONG TERM GOAL #5   Title Pt to remain infection free throughout CDT course to limit infection and LE progression.   Baseline dependent   Period Weeks   Status On-going   OT LONG TERM GOAL #6    Title During Management Phase CDT Pt to sustain limb volume reductions achieved during Intensive Phase CDT within 5% utilizing LE self-care protocols, appropriate compression garments/ devices, and moderate caregiver assistance.   Baseline dependent   Time 6   Period Months   Status On-going               Plan - 08/27/15 1222    Clinical Impression Statement Pt continues to c/o tenderness and soreness at medial R leg. Reddened, sore area appears to have increased in diameter by ~ 1 cm since last week and skin discoloration is darkened. Small scabbed over nick in the skin lateral to possible ulceration site is observed today, but no signs of infection are visible. Pt instructed NOT to continue to wear toe wraps between sessuions with out changing  at most q 24 hours.   Pt will benefit from skilled therapeutic intervention in order to improve on the following deficits (Retired) Difficulty walking;Impaired flexibility;Increased edema;Decreased knowledge of precautions;Decreased skin integrity;Pain;Decreased balance;Decreased knowledge of use of DME;Decreased scar mobility;Decreased mobility  impaired cognition, including delayed processing, decreased memory, and decreased visual spatial skills   Rehab Potential Good   Clinical Impairments Affecting Rehab Potential decreased balance, difficulty walkingL decreased L knee akle and foot range of motion 2/2 girth and decreased tissue flexibility at joints, falls risk, infection risk, impaired cognition needed for learning LE self cae   OT Frequency 3x / week   OT Duration 12 weeks   OT Treatment/Interventions Self-care/ADL training;DME and/or AE instruction;Manual lymph drainage;Patient/family education;Compression bandaging;Therapeutic exercises;Scar mobilization;Manual Therapy   OT Home Exercise Plan continue LE ther ex daily as directed  w/ parent cuing   Consulted and Agree with Plan of Care Patient        Problem List Patient Active  Problem List   Diagnosis Date Noted  . Episodic tension-type headache, not intractable 07/22/2015  . Visit for suture removal 06/25/2015  . Hydrocephalus, adult 09/10/2014  . Glaucoma 08/22/2014  . Vision decreased 08/22/2014  . Frontal headache 08/21/2014  . Right leg swelling 02/27/2014  . Chest pain on respiration 02/27/2014  . Postural dizziness 04/26/2013  . Fatigue 04/26/2013  . Abnormality of gait 01/25/2013  . Preventative health care 05/18/2011  . Lymphedema 08/06/2006   Loel Dubonnet, MS, OTR/L, CLT-LANA 08/27/2015 12:27 PM'  [place] [facility] MAIN [facility] SERVICES [address] [place], [place], [address] Phone: [phone]   Fax:  [phone]  Name: Alison Fox MRN: [id number] Date of Birth: 03-15-1971

## 2015-08-28 ENCOUNTER — Ambulatory Visit: Payer: Medicare Other | Admitting: Occupational Therapy

## 2015-08-28 DIAGNOSIS — I890 Lymphedema, not elsewhere classified: Secondary | ICD-10-CM | POA: Diagnosis not present

## 2015-08-28 NOTE — Patient Instructions (Signed)
LE instructions and precautions as established- see initial eval.   

## 2015-08-28 NOTE — Therapy (Signed)
[place] [facility] MAIN [facility] SERVICES [address] [place], [place], [address] Phone: [phone]   Fax:  [phone]  Occupational Therapy Treatment  Patient Details  Name: Alison Fox MRN: [id number] Date of Birth: 1971/03/24 No Data Recorded  Encounter Date: 08/28/2015      OT End of Session - 08/28/15 1225    Visit Number 18   Number of Visits 36   Date for OT Re-Evaluation 10/01/15   OT Start Time 1130   OT Stop Time 1215   OT Time Calculation (min) 45 min      Past Medical History  Diagnosis Date  . Lymphedema     Chronic following left knee surgery in 2000.  Marland Kitchen Headache     Past Surgical History  Procedure Laterality Date  . Knee arthroscopy  2000    Left knee.     There were no vitals filed for this visit.  Visit Diagnosis:  Lymphedema      Subjective Assessment - 08/28/15 1219    Subjective  Pt presents for OT Rx visit 18 for CDT to BLE w/ LLE compression wraps in place.Pt reports continued  soreness in LLE toes and medial R leg. Pt endorsed sleeping in RLE ace wraps last night despite instructing her not to do so. Reinforced importnce of removing RLE ace wraps before bed each night.   Patient is accompained by: Family member   Pertinent History onset in 68s and positive family history for maternal grandmother and cousin suggests primary LE Tarda; arthroscopic knee sx ~ 2000?, recent fall w/ head laceration. 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 No/denies   Pain Onset --                      OT Treatments/Exercises (OP) - 08/28/15 0001    ADLs   ADL Education Given Yes   Manual Therapy   Manual Therapy Edema management;Manual Lymphatic Drainage (MLD);Compression Bandaging;Other (comment)   Manual therapy  comments Point tenderness to R distal medial leg w/ skin care an palpation. Venous ulcer appears to be forming in the area of previous  trauma. No open wound yet, but skin is reddened under the skin and warm to touch in keeping w/ inflamation   Soft tissue mobilization fibrosis technique at L medial and lateral malleoli   Manual Lymphatic Drainage (MLD) MLD to LLE as est.   Compression Bandaging . No noe wraps today 2/2 soreness. RLE with light single layer ace wrap from base of toes to below knee.Added comprex foam kidneys to paosterir malleoli. LLE gradient compression wraps applied circumferentgradient configuration frcotton stockinett; 8 cm x 1 to foot and ankle, 10 cm x 2, then  12 cm x 2- all layered over .04 x 10 cm and 12 cm Rosidol Soft foam from A-G.   Other Manual Therapy skin care BLE                OT Education - 08/28/15 1224    Education provided Yes   Education Details Continued skilled Pt/caregiver Education  And LE ADL training throughout visit for lymphedema self care components, including compression wrapping, compression garment and device wear/care, lymphatic pumping ther ex, simple self-MLD, and skin care. Discussed progress towards goals.   Person(s) Educated  Patient;Parent(s)   Comprehension Verbalized understanding             OT Long Term Goals - 08/14/15 1218    OT LONG TERM GOAL #1   Title Pt able to correctly apply gradient compression wraps to below knee with moderate caregiver assistance for optimal limb volume reduction and improvement in tissue integrity.   Baseline dependent    Time 2   Status Achieved   OT LONG TERM GOAL #2   Title Pt to achieve 15% limb volume reduction in LLE, and 10% reduction in RLE by DC to limit lymphedema (LE) progression and infection risk.- NEW GOAL 25%   Baseline dependent   Time 12   Period Weeks   Status Achieved   OT LONG TERM GOAL #3   Title Pt > 75% compliant with all daily LE self-care protocols w/ moderate  caregiver assistance, including simple self-manual lymphatic drainage (MLD), skin care, lymphatic pumping therex, and donning/ doffing progression garments.   Baseline dependent   Time 12   Status Achieved   OT LONG TERM GOAL #5   Title Pt to remain infection free throughout CDT course to limit infection and LE progression.   Baseline dependent   Period Weeks   Status On-going   OT LONG TERM GOAL #6   Title During Management Phase CDT Pt to sustain limb volume reductions achieved during Intensive Phase CDT within 5% utilizing LE self-care protocols, appropriate compression garments/ devices, and moderate caregiver assistance.   Baseline dependent   Time 6   Period Months   Status On-going               Plan - 08/28/15 1226    Clinical Impression Statement No visible change in suspected venous stasis ulcer brewing on R medial leg today. Reiterated instructions NOT t to sleep in knee length single layer ace wraps on RLE and reinforced importance of LE self care, including daily skin care throughout session.    Pt will benefit from skilled therapeutic intervention in order to improve on the following deficits (Retired) Difficulty walking;Impaired flexibility;Increased edema;Decreased knowledge of precautions;Decreased skin integrity;Pain;Decreased balance;Decreased knowledge of use of DME;Decreased scar mobility;Decreased mobility  impaired cognition, including delayed processing, decreased memory, and decreased visual spatial skills   Rehab Potential Good   Clinical Impairments Affecting Rehab Potential decreased balance, difficulty walkingL decreased L knee akle and foot range of motion 2/2 girth and decreased tissue flexibility at joints, falls risk, infection risk, impaired cognition needed for learning LE self cae   OT Frequency 3x / week   OT Duration 12 weeks   OT Treatment/Interventions Self-care/ADL training;DME and/or AE instruction;Manual lymph drainage;Patient/family  education;Compression bandaging;Therapeutic exercises;Scar mobilization;Manual Therapy   OT Home Exercise Plan continue LE ther ex daily as directed  w/ parent cuing   Consulted and Agree with Plan of Care Patient        Problem List Patient Active Problem List   Diagnosis Date Noted  . Episodic tension-type headache, not intractable 07/22/2015  . Visit for suture removal 06/25/2015  . Hydrocephalus, adult 09/10/2014  . Glaucoma 08/22/2014  . Vision decreased 08/22/2014  . Frontal headache 08/21/2014  . Right leg swelling 02/27/2014  . Chest pain on respiration 02/27/2014  . Postural dizziness 04/26/2013  . Fatigue 04/26/2013  . Abnormality of gait 01/25/2013  . Preventative health care 05/18/2011  . Lymphedema 08/06/2006    Loel Dubonnetheresa Mitali [place]ne[place], MS, OTR/L, CLT-LANA 08/28/2015 12:30 PM   [place] [facility] MAIN REHAB  SERVICES [address] [place], [place], [address] Phone: [phone]   Fax:  [phone]  Name: Irania Durell MRN: [id number] Date of Birth: 1971-09-05

## 2015-08-29 ENCOUNTER — Encounter: Payer: Medicare Other | Attending: Surgery | Admitting: Surgery

## 2015-08-29 DIAGNOSIS — I890 Lymphedema, not elsewhere classified: Secondary | ICD-10-CM | POA: Diagnosis not present

## 2015-08-29 DIAGNOSIS — I87301 Chronic venous hypertension (idiopathic) without complications of right lower extremity: Secondary | ICD-10-CM | POA: Diagnosis not present

## 2015-08-29 NOTE — Progress Notes (Signed)
Alison Fox, Alison Fox ([id number]) Visit Report for 08/29/2015 Allergy List Details Patient Name: Alison Fox, Alison Fox Date of Service: 08/29/2015 9:00 AM Medical Record Number: [id number] Patient Account Number: [id number] Date of Birth/Sex: 05-Feb-1971 (44 y.o. Female) Treating RN: Clover MealyAfful, RN, BSN, [place] Sinkita Primary Care Physician: Erlinda HongVINCENT, DUNCAN Other Clinician: Referring Physician: Treating Physician/Extender: Rudene ReBritto, Errol Weeks in Treatment: 0 Allergies Active Allergies NO KNOWN ALERGIES Allergy Notes Electronic Signature(s) Signed: 08/29/2015 9:14:19 AM By: Elpidio EricAfful, Rita BSN, RN Entered By: Elpidio EricAfful, Rita on 08/29/2015 09:14:19 Alison Fox, Alison Fox ([id number]) -------------------------------------------------------------------------------- Arrival Information Details Patient Name: Alison Fox, Alison Fox Date of Service: 08/29/2015 9:00 AM Medical Record Number: [id number] Patient Account Number: [id number] Date of Birth/Sex: 05-Feb-1971 (43 y.o. Female) Treating RN: Clover MealyAfful, RN, BSN, [place] Primary Care Physician: Erlinda HongVINCENT, DUNCAN Other Clinician: Referring Physician: Treating Physician/Extender: Rudene ReBritto, Errol Weeks in Treatment: 0 Visit Information Patient Arrived: Ambulatory Arrival Time: 09:12 Accompanied By: mother Transfer Assistance: None Patient Identification Verified: Yes Secondary Verification Process Yes Completed: Patient Requires Transmission-Based No Precautions: Patient Has Alerts: No Electronic Signature(s) Signed: 08/29/2015 9:13:14 AM By: Elpidio EricAfful, Rita BSN, RN Entered By: Elpidio EricAfful, Rita on 08/29/2015 09:13:14 Alison Fox, Alison Fox ([id number]) -------------------------------------------------------------------------------- Clinic Level of Care Assessment Details Patient Name: Alison Fox, Alison Fox Date of Service: 08/29/2015 9:00 AM Medical Record Number: [id number] Patient Account Number: [id number] Date of Birth/Sex: 05-Feb-1971 (43 y.o. Female) Treating RN: Afful, RN, BSN, [place] Sinkita Primary Care  Physician: Erlinda HongVINCENT, DUNCAN Other Clinician: Referring Physician: Treating Physician/Extender: Rudene ReBritto, Errol Weeks in Treatment: 0 Clinic Level of Care Assessment Items TOOL 2 Quantity Score []  - Use when only an EandM is performed on the INITIAL visit 0 ASSESSMENTS - Nursing Assessment / Reassessment X - General Physical Exam (combine w/ comprehensive assessment (listed just 1 20 below) when performed on new pt. evals) X - Comprehensive Assessment (HX, ROS, Risk Assessments, Wounds Hx, etc.) 1 25 ASSESSMENTS - Wound and Skin Assessment / Reassessment []  - Simple Wound Assessment / Reassessment - one wound 0 []  - Complex Wound Assessment / Reassessment - multiple wounds 0 X - Dermatologic / Skin Assessment (not related to wound area) 1 10 ASSESSMENTS - Ostomy and/or Continence Assessment and Care []  - Incontinence Assessment and Management 0 []  - Ostomy Care Assessment and Management (repouching, etc.) 0 PROCESS - Coordination of Care X - Simple Patient / Family Education for ongoing care 1 15 []  - Complex (extensive) Patient / Family Education for ongoing care 0 []  - Staff obtains ChiropractorConsents, Records, Test Results / Process Orders 0 []  - Staff telephones HHA, Nursing Homes / Clarify orders / etc 0 []  - Routine Transfer to another Facility (non-emergent condition) 0 []  - Routine Hospital Admission (non-emergent condition) 0 X - New Admissions / Manufacturing engineernsurance Authorizations / Ordering NPWT, Apligraf, etc. 1 15 []  - Emergency Hospital Admission (emergent condition) 0 []  - Simple Discharge Coordination 0 Pospisil, Mariely ([id number]) []  - Complex (extensive) Discharge Coordination 0 PROCESS - Special Needs []  - Pediatric / Minor Patient Management 0 []  - Isolation Patient Management 0 []  - Hearing / Language / Visual special needs 0 []  - Assessment of Community assistance (transportation, D/C planning, etc.) 0 []  - Additional assistance / Altered mentation 0 []  - Support Surface(s)  Assessment (bed, cushion, seat, etc.) 0 INTERVENTIONS - Wound Cleansing / Measurement []  - Wound Imaging (photographs - any number of wounds) 0 []  - Wound Tracing (instead of photographs) 0 []  - Simple Wound Measurement - one wound 0 []  - Complex Wound Measurement - multiple wounds 0 []  - Simple Wound Cleansing - one wound 0 []  -  Complex Wound Cleansing - multiple wounds 0 INTERVENTIONS - Wound Dressings  - Small Wound Dressing one or multiple wounds 0  - Medium Wound Dressing one or multiple wounds 0  - Large Wound Dressing one or multiple wounds 0  - Application of Medications - injection 0 INTERVENTIONS - Miscellaneous  - External ear exam 0  - Specimen Collection (cultures, biopsies, blood, body fluids, etc.) 0  - Specimen(s) / Culture(s) sent or taken to Lab for analysis 0  - Patient Transfer (multiple staff / Michiel Sites Lift / Similar devices) 0  - Simple Staple / Suture removal (25 or less) 0  - Complex Staple / Suture removal (26 or more) 0 Poullard, Karoline ([id number])  - Hypo / Hyperglycemic Management (close monitor of Blood Glucose) 0 X - Ankle / Brachial Index (ABI) - do not check if billed separately 1 15 Has the patient been seen at the hospital within the last three years: Yes Total Score: 100 Level Of Care: New/Established - Level 3 Electronic Signature(s) Signed: 08/29/2015 4:05:06 PM By: Elpidio Eric BSN, RN Entered By: Elpidio Eric on 08/29/2015 16:05:06 Alison Fox ([id number]) -------------------------------------------------------------------------------- Encounter Discharge Information Details Patient Name: Alison Fox Date of Service: 08/29/2015 9:00 AM Medical Record Number: [id number] Patient Account Number: [id number] Date of Birth/Sex: [birth year] (44 y.o. Female) Treating RN: Afful, RN, BSN, [place] Sink Primary Care Physician: Erlinda Hong Other Clinician: Referring Physician: Treating Physician/Extender: Rudene Re in  Treatment: 0 Encounter Discharge Information Items Discharge Pain Level: 0 Discharge Condition: Stable Ambulatory Status: Ambulatory Discharge Destination: Home Transportation: Private Auto Accompanied By: mother Schedule Follow-up Appointment: No Medication Reconciliation completed and provided to Patient/Care No Raelea Gosse: Provided on Clinical Summary of Care: 08/29/2015 Form Type Recipient Paper Patient FM Electronic Signature(s) Signed: 08/29/2015 9:52:55 AM By: Gwenlyn Perking Previous Signature: 08/29/2015 9:51:30 AM Version By: Elpidio Eric BSN, RN Entered By: Gwenlyn Perking on 08/29/2015 09:52:55 Meske, Garrie ([id number]) -------------------------------------------------------------------------------- Lower Extremity Assessment Details Patient Name: Alison Fox Date of Service: 08/29/2015 9:00 AM Medical Record Number: [id number] Patient Account Number: [id number] Date of Birth/Sex: 05-24-1971 (43 y.o. Female) Treating RN: Afful, RN, BSN, [place] Sink Primary Care Physician: Erlinda Hong Other Clinician: Referring Physician: Treating Physician/Extender: Rudene Re in Treatment: 0 Edema Assessment Assessed: [Left: No] [Right: No] Edema: [Left: Ye] [Right: s] Vascular Assessment Claudication: Claudication Assessment [Right:None] Pulses: Posterior Tibial Dorsalis Pedis Palpable: [Right:Yes] Extremity colors, hair growth, and conditions: Extremity Color: [Right:Mottled] Hair Growth on Extremity: [Right:No] Temperature of Extremity: [Right:Warm] Capillary Refill: [Right:< 3 seconds] Blood Pressure: Brachial: [Right:100] Dorsalis Pedis: [Left:Dorsalis Pedis: 138] Ankle: Posterior Tibial: [Left:Posterior Tibial: 125] [Right:1.38] Toe Nail Assessment Left: Right: Thick: Yes Discolored: Yes Deformed: Yes Improper Length and Hygiene: Yes Electronic Signature(s) Signed: 08/29/2015 9:50:19 AM By: Elpidio Eric BSN, RN Previous Signature: 08/29/2015  9:24:15 AM Version By: Elpidio Eric BSN, RN Entered By: Elpidio Eric on 08/29/2015 09:50:19 Sikkema, Eura ([id number]) -------------------------------------------------------------------------------- Multi Wound Chart Details Patient Name: Alison Fox Date of Service: 08/29/2015 9:00 AM Medical Record Number: [id number] Patient Account Number: [id number] Date of Birth/Sex: Jul 20, 1971 (44 y.o. Female) Treating RN: Clover Mealy, RN, BSN, [place] Sink Primary Care Physician: Erlinda Hong Other Clinician: Referring Physician: Treating Physician/Extender: Rudene Re in Treatment: 0 Vital Signs Height(in): 68 Pulse(bpm): 68 Weight(lbs): 155 Blood Pressure 100/58 (mmHg): Body Mass Index(BMI): 24 Temperature(F): 97.8 Respiratory Rate 17 (breaths/min): Wound Assessments Treatment Notes Electronic Signature(s) Signed: 08/29/2015 9:42:54 AM By: Elpidio Eric BSN, RN Entered By: Elpidio Eric on 08/29/2015 09:42:54 DENAJA, VERHOEVEN ([id number]) -------------------------------------------------------------------------------- Multi-Disciplinary Care Plan Details Patient Name: [place],  Yoana Date of Service: 08/29/2015 9:00 AM Medical Record Number: [id number] Patient Account Number: [id number] Date of Birth/Sex: 1971-07-09 (44 y.o. Female) Treating RN: Afful, RN, BSN, [place] Sink Primary Care Physician: Erlinda Hong Other Clinician: Referring Physician: Treating Physician/Extender: Rudene Re in Treatment: 0 Active Inactive Orientation to the Wound Care Program Nursing Diagnoses: Knowledge deficit related to the wound healing center program Goals: Patient/caregiver will verbalize understanding of the Wound Healing Center Program Date Initiated: 08/29/2015 Goal Status: Active Interventions: Provide education on orientation to the wound center Notes: Venous Leg Ulcer Nursing Diagnoses: Potential for venous Insuffiency (use before diagnosis confirmed) Goals: Non-invasive  venous studies are completed as ordered Date Initiated: 08/29/2015 Goal Status: Active Patient will maintain optimal edema control Date Initiated: 08/29/2015 Goal Status: Active Patient/caregiver will verbalize understanding of disease process and disease management Date Initiated: 08/29/2015 Goal Status: Active Verify adequate tissue perfusion prior to therapeutic compression application Date Initiated: 08/29/2015 Goal Status: Active Interventions: Assess peripheral edema status every visit. Provide education on venous insufficiency COVINGTON, Jalexus ([id number]) Treatment Activities: Non-invasive vascular studies : 08/29/2015 Venous Duplex Doppler : 08/29/2015 Notes: Electronic Signature(s) Signed: 08/29/2015 9:42:40 AM By: Elpidio Eric BSN, RN Entered By: Elpidio Eric on 08/29/2015 09:42:40 Doyle, Lenee ([id number]) -------------------------------------------------------------------------------- Pain Assessment Details Patient Name: Alison Fox Date of Service: 08/29/2015 9:00 AM Medical Record Number: [id number] Patient Account Number: [id number] Date of Birth/Sex: 10/21/1970 (45 y.o. Female) Treating RN: Clover Mealy, RN, BSN, [place] Sink Primary Care Physician: Erlinda Hong Other Clinician: Referring Physician: Treating Physician/Extender: Rudene Re in Treatment: 0 Active Problems Location of Pain Severity and Description of Pain Patient Has Paino No Site Locations Pain Management and Medication Current Pain Management: Electronic Signature(s) Signed: 08/29/2015 9:13:29 AM By: Elpidio Eric BSN, RN Entered By: Elpidio Eric on 08/29/2015 09:13:29 Alison Fox ([id number]) -------------------------------------------------------------------------------- Patient/Caregiver Education Details Patient Name: Alison Fox Date of Service: 08/29/2015 9:00 AM Medical Record Number: [id number] Patient Account Number: [id number] Date of Birth/Gender: 1970/10/28 (43 y.o.  Female) Treating RN: Clover Mealy, RN, BSN, [place] Sink Primary Care Physician: Erlinda Hong Other Clinician: Referring Physician: Treating Physician/Extender: Rudene Re in Treatment: 0 Education Assessment Education Provided To: Patient Education Topics Provided Basic Hygiene: Methods: Explain/Verbal Responses: State content correctly Venous: Methods: Explain/Verbal Responses: State content correctly Welcome To The Wound Care Center: Methods: Explain/Verbal Responses: State content correctly Electronic Signature(s) Signed: 08/29/2015 9:51:48 AM By: Elpidio Eric BSN, RN Entered By: Elpidio Eric on 08/29/2015 09:51:48 Aderman, Scarlette Calico ([id number]) -------------------------------------------------------------------------------- Vitals Details Patient Name: Alison Fox Date of Service: 08/29/2015 9:00 AM Medical Record Number: [id number] Patient Account Number: [id number] Date of Birth/Sex: 1970/10/21 (43 y.o. Female) Treating RN: Afful, RN, BSN,  Sink Primary Care Physician: Erlinda Hong Other Clinician: Referring Physician: Treating Physician/Extender: Rudene Re in Treatment: 0 Vital Signs Time Taken: 09:13 Temperature (F): 97.8 Height (in): 68 Pulse (bpm): 68 Source: Stated Respiratory Rate (breaths/min): 17 Weight (lbs): 155 Blood Pressure (mmHg): 100/58 Source: Measured Reference Range: 80 - 120 mg / dl Body Mass Index (BMI): 23.6 Electronic Signature(s) Signed: 08/29/2015 9:24:49 AM By: Elpidio Eric BSN, RN Previous Signature: 08/29/2015 9:13:56 AM Version By: Elpidio Eric BSN, RN Entered By: Elpidio Eric on 08/29/2015 09:24:49

## 2015-08-29 NOTE — Progress Notes (Signed)
Merrilee SeashoreMCCRAW, Wells ([id number]) Visit Report for 08/29/2015 Abuse/Suicide Risk Screen Details Patient Name: Merrilee SeashoreMCCRAW, Navika 08/29/2015 9:00 Date of Service: AM Medical Record [id number] Number: Patient Account Number: [id number] [birth year] (43 y.o. Afful, RN, BSN, Date of Birth/Sex: Treating RN: Female) Psychologist, clinicalita Primary Care Physician: Erlinda HongVINCENT, DUNCAN Other Clinician: Referring Physician: Treating Britto, Errol Physician/Extender: Weeks in Treatment: 0 Abuse/Suicide Risk Screen Items Answer ABUSE/SUICIDE RISK SCREEN: Has anyone close to you tried to hurt or harm you recentlyo No Do you feel uncomfortable with anyone in your familyo No Has anyone forced you do things that you didnot want to doo No Do you have any thoughts of harming yourselfo No Patient displays signs or symptoms of abuse and/or neglect. No Electronic Signature(s) Signed: 08/29/2015 9:16:09 AM By: Elpidio EricAfful, Rita BSN, RN Entered By: Elpidio EricAfful, Rita on 08/29/2015 09:16:09 Merrilee SeashoreMCCRAW, Rikita ([id number]) -------------------------------------------------------------------------------- Activities of Daily Living Details Patient Name: Merrilee SeashoreMCCRAW, Yona 08/29/2015 9:00 Date of Service: AM Medical Record [id number] Number: Patient Account Number: [id number] [birth year] (43 y.o. Afful, RN, BSN, Date of Birth/Sex: Treating RN: Female) [place] Sinkita Primary Care Physician: Erlinda HongVINCENT, DUNCAN Other Clinician: Referring Physician: Treating Britto, Errol Physician/Extender: Weeks in Treatment: 0 Activities of Daily Living Items Answer Activities of Daily Living (Please select one for each item) Drive Automobile Need Assistance Take Medications Completely Able Use Telephone Completely Able Care for Appearance Completely Able Use Toilet Completely Able Bath / Shower Completely Able Dress Self Completely Able Feed Self Completely Able Walk Completely Able Get In / Out Bed Completely Able Housework Completely Able Prepare Meals Completely  Able Handle Money Completely Able Shop for Self Completely Able Electronic Signature(s) Signed: 08/29/2015 9:15:57 AM By: Elpidio EricAfful, Rita BSN, RN Entered By: Elpidio EricAfful, Rita on 08/29/2015 09:15:57 Hun, [name] ([id number]) -------------------------------------------------------------------------------- Education Assessment Details Patient Name: Merrilee SeashoreMCCRAW, [place] 08/29/2015 9:00 Date of Service: AM Medical Record [id number] Number: Patient Account Number: [id number] [birth year] (43 y.o. Afful, RN, BSN, Date of Birth/Sex: Treating RN: Female) Psychologist, clinicalita Primary Care Physician: Erlinda HongVINCENT, DUNCAN Other Clinician: Referring Physician: Treating Britto, Errol Physician/Extender: Tania AdeWeeks in Treatment: 0 Primary Learner Assessed: Patient Learning Preferences/Education Level/Primary Language Learning Preference: Explanation Highest Education Level: High School Preferred Language: English Cognitive Barrier Assessment/Beliefs Language Barrier: No Physical Barrier Assessment Impaired Vision: Yes Glasses Impaired Hearing: No Decreased Hand dexterity: No Knowledge/Comprehension Assessment Knowledge Level: Medium Comprehension Level: Medium Ability to understand written Medium instructions: Ability to understand verbal Medium instructions: Motivation Assessment Anxiety Level: Calm Cooperation: Cooperative Education Importance: Acknowledges Need Interest in Health Problems: Asks Questions Perception: Coherent Willingness to Engage in Self- Medium Management Activities: Readiness to Engage in Self- Medium Management Activities: Electronic Signature(s) Signed: 08/29/2015 9:15:32 AM By: Elpidio EricAfful, Rita BSN, RN Entered By: Elpidio EricAfful, Rita on 08/29/2015 09:15:32 Kearse, Jaisha ([id number]) [place], [place] ([id number]) -------------------------------------------------------------------------------- Fall Risk Assessment Details Patient Name: Merrilee SeashoreMCCRAW, Eve 08/29/2015 9:00 Date of Service: AM Medical  Record [id number] Number: Patient Account Number: [id number] [birth year] (43 y.o. Afful, RN, BSN, Date of Birth/Sex: Treating RN: Female) Psychologist, clinicalita Primary Care Physician: Erlinda HongVINCENT, DUNCAN Other Clinician: Referring Physician: Treating Britto, Errol Physician/Extender: Tania AdeWeeks in Treatment: 0 Fall Risk Assessment Items FALL RISK ASSESSMENT: History of falling - immediate or within 3 months 0 No Secondary diagnosis 0 No Ambulatory aid None/bed rest/wheelchair/nurse 0 Yes Crutches/cane/walker 0 No Furniture 0 No IV Access/Saline Lock 0 No Gait/Training Normal/bed rest/immobile 0 Yes Weak 0 No Impaired 0 No Mental Status Oriented to own ability 0 Yes Electronic Signature(s) Signed: 08/29/2015 9:15:06 AM By: Elpidio EricAfful, Rita BSN, RN Entered By: Elpidio EricAfful, Rita on 08/29/2015 09:15:06 Im, Morris ([id number]) -------------------------------------------------------------------------------- Foot  Assessment Details Patient Name: MERSADEZ, LINDEN 08/29/2015 9:00 Date of Service: AM Medical Record [id number] Number: Patient Account Number: [id number] 1971-07-10 (43 y.o. Afful, RN, BSN, Date of Birth/Sex: Treating RN: Female) Psychologist, clinical Primary Care Physician: Erlinda Hong Other Clinician: Referring Physician: Treating Britto, Errol Physician/Extender: Weeks in Treatment: 0 Foot Assessment Items Site Locations + = Sensation present, - = Sensation absent, C = Callus, U = Ulcer R = Redness, W = Warmth, M = Maceration, PU = Pre-ulcerative lesion F = Fissure, S = Swelling, D = Dryness Assessment Right: Left: Other Deformity: No No Prior Foot Ulcer: No No Prior Amputation: No No Charcot Joint: No No Ambulatory Status: Ambulatory Without Help Gait: Steady Electronic Signature(s) Signed: 08/29/2015 9:14:32 AM By: Elpidio Eric BSN, RN Entered By: Elpidio Eric on 08/29/2015 09:14:32 Oshiro, Louvina  ([id number]) -------------------------------------------------------------------------------- Nutrition Risk Assessment Details Patient Name: CORLIS, ANGELICA 08/29/2015 9:00 Date of Service: AM Medical Record [id number] Number: Patient Account Number: [id number] Dec 21, 1970 (43 y.o. Afful, RN, BSN, Date of Birth/Sex: Treating RN: Female) Psychologist, clinical Primary Care Physician: Erlinda Hong Other Clinician: Referring Physician: Treating Britto, Errol Physician/Extender: Weeks in Treatment: 0 Height (in): 68 Weight (lbs): 155 Body Mass Index (BMI): 23.6 Nutrition Risk Assessment Items NUTRITION RISK SCREEN: I have an illness or condition that made me change the kind and/or 0 No amount of food I eat I eat fewer than two meals per day 0 No I eat few fruits and vegetables, or milk products 0 No I have three or more drinks of beer, liquor or wine almost every day 0 No I have tooth or mouth problems that make it hard for me to eat 0 No I don't always have enough money to buy the food I need 0 No I eat alone most of the time 0 No I take three or more different prescribed or over-the-counter drugs a 0 No day Without wanting to, I have lost or gained 10 pounds in the last six 2 Yes months I am not always physically able to shop, cook and/or feed myself 0 No Nutrition Protocols Good Risk Protocol 0 No interventions needed Moderate Risk Protocol Electronic Signature(s) Signed: 08/29/2015 9:14:50 AM By: Elpidio Eric BSN, RN Entered By: Elpidio Eric on 08/29/2015 09:14:50

## 2015-08-30 ENCOUNTER — Ambulatory Visit: Payer: Medicare Other | Admitting: Occupational Therapy

## 2015-08-30 DIAGNOSIS — I890 Lymphedema, not elsewhere classified: Secondary | ICD-10-CM | POA: Diagnosis not present

## 2015-08-30 NOTE — Therapy (Signed)
[facility] MAIN [facility] SERVICES [address] [place], [place], [address] Phone: [phone]   Fax:  [phone]  Occupational Therapy Treatment  Patient Details  Name: Alison Fox MRN: [id number] Date of Birth: [birth year] No Data Recorded  Encounter Date: 08/30/2015      OT End of Session - 08/30/15 1212    Visit Number 19   Number of Visits 36   Date for OT Re-Evaluation 10/01/15   OT Start Time 1100   OT Stop Time 1200   OT Time Calculation (min) 60 min   Activity Tolerance Patient tolerated treatment well;No increased pain   Behavior During Therapy [facility] for tasks assessed/performed      Past Medical History  Diagnosis Date  . Lymphedema     Chronic following left knee surgery in 2000.  Marland Kitchen Headache     Past Surgical History  Procedure Laterality Date  . Knee arthroscopy  2000    Left knee.     There were no vitals filed for this visit.  Visit Diagnosis:  Lymphedema      Subjective Assessment - 08/30/15 1103    Subjective  Pt presents for OT Rx visit 19 for CDT to BLE w/ LLE compression wraps in place.Pt reports she went to wound clinic and , "they looked at my legs. I go back again next week. They told me to keep doing the lymphedema ."   Patient is accompained by: Family member   Pertinent History onset in 85s and positive family history for maternal grandmother and cousin suggests primary LE Tarda; arthroscopic knee sx ~ 2000?, recent fall w/ head laceration. 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 Venous ulcer appears to be forming in the area of previous  trauma. No open wound yet, but skin is reddened under the skin and warm to touch in keeping w/ inflamation   Soft tissue mobilization fibrosis technique at L medial and lateral malleoli   Manual Lymphatic Drainage (MLD) MLD to LLE as est.   Compression Bandaging . No noe wraps today 2/2 soreness. RLE with light single layer ace wrap from base of toes to below knee.Added comprex foam kidneys to paosterir malleoli. LLE gradient compression wraps applied circumferentgradient configuration frcotton stockinett; 8 cm x 1 to foot and ankle, 10 cm x 2, then  12 cm x 2- all layered over .04 x 10 cm and 12 cm Rosidol Soft foam from A-G.   Other Manual Therapy skin care BLE                OT Education - 08/30/15 1218    Education provided Yes   Education Details Continued skilled Pt/caregiver Education  And LE ADL training throughout visit for lymphedema self care components, including compression wrapping, compression garment and device wear/care, lymphatic pumping ther ex, simple self-MLD, and skin care. Discussed progress towards goals.   Person(s) Educated Patient;Parent(s)  Methods Explanation;Verbal cues   Comprehension Verbalized understanding;Need further instruction             OT Long Term Goals - 08/14/15 1218    OT LONG TERM GOAL #1   Title Pt able to correctly apply gradient compression wraps to below knee with moderate caregiver assistance for optimal limb volume reduction and improvement in tissue integrity.   Baseline dependent    Time 2   Status Achieved   OT LONG TERM GOAL #2   Title Pt to achieve 15% limb volume reduction in LLE, and 10% reduction in RLE by DC to limit lymphedema (LE) progression and infection risk.- NEW GOAL 25%   Baseline dependent   Time 12   Period Weeks   Status Achieved   OT LONG TERM GOAL #3   Title Pt > 75%  compliant with all daily LE self-care protocols w/ moderate caregiver assistance, including simple self-manual lymphatic drainage (MLD), skin care, lymphatic pumping therex, and donning/ doffing progression garments.   Baseline dependent   Time 12   Status Achieved   OT LONG TERM GOAL #5   Title Pt to remain infection free throughout CDT course to limit infection and LE progression.   Baseline dependent   Period Weeks   Status On-going   OT LONG TERM GOAL #6   Title During Management Phase CDT Pt to sustain limb volume reductions achieved during Intensive Phase CDT within 5% utilizing LE self-care protocols, appropriate compression garments/ devices, and moderate caregiver assistance.   Baseline dependent   Time 6   Period Months   Status On-going               Plan - 08/30/15 1213    Clinical Impression Statement RLE wrap applied incorrectly, so reviewed proper , lite, single layer application with Pt and mother today. Caregiver verbalized understanding of importance of not applying too tightly and Pt not sleeping in ace wraps. Pt was unable to recall details of her visit w/ wound clinic yesterday. Pt's mother is HOH , so she was unable to recall MD's advice. They tell me wound clinic  will see her again next week. OT will review note when available in Epic. No change noted in RLE today. LLE is well hydrated with excellent  hydration today. Pt continues to progress towards clinical goals.   Pt will benefit from skilled therapeutic intervention in order to improve on the following deficits (Retired) Difficulty walking;Impaired flexibility;Increased edema;Decreased knowledge of precautions;Decreased skin integrity;Pain;Decreased balance;Decreased knowledge of use of DME;Decreased scar mobility;Decreased mobility  impaired cognition, including delayed processing, decreased memory, and decreased visual spatial skills   Rehab Potential Good   Clinical Impairments Affecting Rehab Potential  decreased balance, difficulty walkingL decreased L knee akle and foot range of motion 2/2 girth and decreased tissue flexibility at joints, falls risk, infection risk, impaired cognition needed for learning LE self cae   OT Frequency 3x / week   OT Duration 12 weeks   OT Treatment/Interventions Self-care/ADL training;DME and/or AE instruction;Manual lymph drainage;Patient/family education;Compression bandaging;Therapeutic exercises;Scar mobilization;Manual Therapy   OT Home Exercise Plan continue LE ther ex daily as directed  w/ parent cuing   Consulted and Agree with Plan of Care Patient        Problem List Patient Active Problem List   Diagnosis Date Noted  . Episodic tension-type headache, not intractable 07/22/2015  . Visit for suture removal 06/25/2015  . Hydrocephalus, adult 09/10/2014  . Glaucoma 08/22/2014  . Vision decreased 08/22/2014  .  Frontal headache 08/21/2014  . Right leg swelling 02/27/2014  . Chest pain on respiration 02/27/2014  . Postural dizziness 04/26/2013  . Fatigue 04/26/2013  . Abnormality of gait 01/25/2013  . Preventative health care 05/18/2011  . Lymphedema 08/06/2006    Loel Dubonnet, MS, OTR/L, [facility] 08/30/2015 12:20 PM   [facility] MAIN [facility] SERVICES [address] [place], [place], [address] Phone: [phone]   Fax:  [phone]  Name: Alison Fox MRN: [id number] Date of Birth: 12/28/1970

## 2015-08-30 NOTE — Patient Instructions (Signed)
LE instructions and precautions as established- see initial eval.   

## 2015-08-30 NOTE — Progress Notes (Signed)
Alison, Fox ([id number]) Visit Report for 08/29/2015 Chief Complaint Document Details Patient Name: Alison Fox, Alison Fox 08/29/2015 9:00 Date of Service: AM Medical Record [id number] Number: Patient Account Number: [id number] [birth year] (44 y.o. Afful, RN, BSN, Date of Birth/Sex: Treating RN: Female) Alison Fox Primary Care Physician: Alison Fox Other Clinician: Referring Physician: Treating Alison Fox Physician/Extender: Alison Fox in Treatment: 0 Information Obtained from: Patient Chief Complaint Patient presents to the wound care center for a consult due non healing wound. 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 She comes in consultation for pain and skin changes of the right lower extremity which she's been having for several months. Electronic Signature(s) Signed: 08/29/2015 10:02:06 AM By: Evlyn Kanner MD, FACS Entered By: Evlyn Kanner on 08/29/2015 10:02:06 Alison, Fox ([id number]) -------------------------------------------------------------------------------- HPI Details Patient Name: Alison Fox, Alison Fox 08/29/2015 9:00 Date of Service: AM Medical Record [id number] Number: Patient Account Number: [id number] September 05, 1971 (44 y.o. Afful, RN, BSN, Date of Birth/Sex: Treating RN: Female) [place] Fox Primary Care Physician: Alison Fox Other Clinician: Referring Physician: Treating Alison Fox Physician/Extender: Weeks in Treatment: 0 History of Present Illness Location: skin changes and pain in the right lower extremity with no ulceration Quality: Patient reports experiencing a dull pain to affected area(s). Severity: Patient states wound (s) are getting better. Duration: Patient has had the wound for > 3 months prior to seeking treatment at the wound center Timing: Pain in wound is Intermittent (comes and goes Context: The skin changes appeared gradually  over time Modifying Factors: Consults to this date include: she has been seeing the lymphedema clinic for her left lower extremity. Associated Signs and Symptoms: Patient reports having increase swelling. HPI Description: 44 year old patient was referred to our clinic by her occupational therapists miss Alison Fox who has been looking after her for left lower extremity lymphedema since October 2016. I understand she noticed changes in the right lower extremity with some skin changes and tenderness and hence wanted a workup for this patient. the patient has chronic hydrocephallus, left leg lymphedema acquired post surgery in the year 2000. Patient also has frequent headaches and is under the care of neurosurgery and neurology. past medical history significant for hydro-Phallus, episodic tension headaches, lymphedema, right leg lymphedema, and glaucoma. Electronic Signature(s) Signed: 08/29/2015 10:14:31 AM By: Evlyn Kanner MD, FACS Entered By: Evlyn Kanner on 08/29/2015 10:14:31 Alison Fox, Alison Fox ([id number]) -------------------------------------------------------------------------------- Physical Exam Details Patient Name: Alison, Fox 08/29/2015 9:00 Date of Service: AM Medical Record [id number] Number: Patient Account Number: [id number] October 16, 1971 (44 y.o. Afful, RN, BSN, Date of Birth/Sex: Treating RN: Female) [place] Fox Primary Care Physician: Alison Fox Other Clinician: Referring Physician: Treating Alison Fox Physician/Extender: Weeks in Treatment: 0 Constitutional . Pulse regular. Respirations normal and unlabored. Afebrile. . Eyes Nonicteric. Reactive to light. Ears, Nose, Mouth, and Throat Lips, teeth, and gums WNL.Marland Kitchen Moist mucosa without lesions . Neck supple and nontender. No palpable supraclavicular or cervical adenopathy. Normal sized without goiter. Respiratory WNL. No retractions.. Cardiovascular Pedal Pulses WNL. ABI on the right is 1.38.Marland Kitchen No  clubbing, cyanosis but has +1 edema of the right lower extremity and she has skin changes of stasis on the right leg.. Chest Breasts symmetical and no nipple discharge.. Breast tissue WNL, no masses, lumps, or tenderness.. Gastrointestinal (GI) Abdomen without masses or tenderness.. No liver or spleen enlargement or tenderness.. Lymphatic No adneopathy. No adenopathy. No adenopathy. Musculoskeletal Adexa without tenderness or enlargement.. Digits and nails w/o clubbing, cyanosis, infection, petechiae, ischemia, or  inflammatory conditions.. Integumentary (Hair, Skin) No suspicious lesions. No crepitus or fluctuance. No peri-wound warmth or erythema. No masses.Marland Kitchen Psychiatric Judgement and insight Intact.. No evidence of depression, anxiety, or agitation.. Notes has +1 edema of the right lower extremity and she has skin changes of stasis on the right leg. no open ulceration or cellulitis. Electronic Signature(s) Signed: 08/29/2015 10:16:10 AM By: Evlyn Kanner MD, FACS Alison, Fox ([id number]) Entered By: Evlyn Kanner on 08/29/2015 10:16:09 Alison, Fox ([id number]) -------------------------------------------------------------------------------- Physician Orders Details Patient Name: Alison Fox, Alison Fox 08/29/2015 9:00 Date of Service: AM Medical Record [id number] Number: Patient Account Number: [id number] 09/02/1971 (44 y.o. Afful, RN, BSN, Date of Birth/Sex: Treating RN: Female) Psychologist, clinical Primary Care Physician: Alison Fox Other Clinician: Referring Physician: Treating Chanah Tidmore Physician/Extender: Alison Fox in Treatment: 0 Verbal / Phone Orders: Yes Clinician: Afful, RN, BSN, Alison Read Back and Verified: Yes Diagnosis Coding Follow-up Appointments o Other: - After VVS appointment in [place] Consults o Vascular - Right venou duplex ;reflux oooo Electronic Signature(s) Signed: 08/29/2015 9:44:30 AM By: Elpidio Eric BSN, RN Signed: 08/29/2015 4:04:17 PM By:  Evlyn Kanner MD, FACS Entered By: Elpidio Eric on 08/29/2015 09:44:30 Alison Fox, Alison Fox ([id number]) -------------------------------------------------------------------------------- Problem List Details Patient Name: Alison Fox, Alison Fox 08/29/2015 9:00 Date of Service: AM Medical Record [id number] Number: Patient Account Number: [id number] 05-12-1971 (43 y.o. Afful, RN, BSN, Date of Birth/Sex: Treating RN: Female) [place] Fox Primary Care Physician: Alison Fox Other Clinician: Referring Physician: Treating Laranda Burkemper Physician/Extender: Alison Fox in Treatment: 0 Active Problems ICD-10 Encounter Code Description Active Date Diagnosis I87.301 Chronic venous hypertension (idiopathic) without 08/29/2015 Yes complications of right lower extremity I89.0 Lymphedema, not elsewhere classified 08/29/2015 Yes Inactive Problems Resolved Problems Electronic Signature(s) Signed: 08/29/2015 10:01:19 AM By: Evlyn Kanner MD, FACS Entered By: Evlyn Kanner on 08/29/2015 10:01:19 HENREITTA, SPITTLER ([id number]) -------------------------------------------------------------------------------- Progress Note Details Patient Name: Alison Fox, Alison Fox 08/29/2015 9:00 Date of Service: AM Medical Record [id number] Number: Patient Account Number: [id number] 07-Oct-1971 (43 y.o. Afful, RN, BSN, Date of Birth/Sex: Treating RN: Female) [place] Fox Primary Care Physician: Alison Fox Other Clinician: Referring Physician: Treating [place] Baldwin Physician/Extender: Alison Fox in Treatment: 0 Subjective Chief Complaint Information obtained from Patient Patient presents to the wound care center for a consult due non healing wound. She has been having chronic lymphedema of the left lower extremity which is being treated at the lymphedema clinic at [facility]. She comes in consultation for pain and skin changes of the right lower extremity which she's been having for several months. History of  Present Illness (HPI) The following HPI elements were documented for the patient's wound: Location: skin changes and pain in the right lower extremity with no ulceration Quality: Patient reports experiencing a dull pain to affected area(s). Severity: Patient states wound (s) are getting better. Duration: Patient has had the wound for > 3 months prior to seeking treatment at the wound center Timing: Pain in wound is Intermittent (comes and goes Context: The skin changes appeared gradually over time Modifying Factors: Consults to this date include: she has been seeing the lymphedema clinic for her left lower extremity. Associated Signs and Symptoms: Patient reports having increase swelling. 44 year old patient was referred to our clinic by her occupational therapists miss Alison Fox who has been looking after her for left lower extremity lymphedema since October 2016. I understand she noticed changes in the right lower extremity with some skin changes and tenderness and hence wanted a workup for this patient. the patient has chronic hydrocephallus, left leg lymphedema acquired post surgery in the year 2000.  Patient also has frequent headaches and is under the care of neurosurgery and neurology. past medical history significant for hydro-Phallus, episodic tension headaches, lymphedema, right leg lymphedema, and glaucoma. Wound History Patient reportedly has not tested positive for osteomyelitis. Patient reportedly has not had testing performed to evaluate circulation in the legs. Patient experiences the following problems associated with their wounds: swelling. Patient History Information obtained from Patient, Caregiver. Alison Fox, Alison Fox ([id number]) Allergies NO KNOWN ALERGIES Family History Cancer - Siblings, Diabetes - Maternal Grandparents, Heart Disease - Mother, Maternal Grandparents, Hypertension - Siblings, Mother, Maternal Grandparents, Seizures - Siblings, No family history  of Hereditary Spherocytosis, Kidney Disease, Lung Disease, Stroke, Thyroid Problems, Tuberculosis. Social History Never smoker, Marital Status - Single, Alcohol Use - Never, Drug Use - No History, Caffeine Use - Daily. Medical History Eyes Denies history of Cataracts, Glaucoma Ear/Nose/Mouth/Throat Denies history of Chronic sinus problems/congestion, Middle ear problems Hematologic/Lymphatic Denies history of Anemia, Hemophilia, Human Immunodeficiency Virus, Lymphedema, Sickle Cell Disease Respiratory Denies history of Aspiration, Asthma, Chronic Obstructive Pulmonary Disease (COPD), Pneumothorax, Sleep Apnea, Tuberculosis Cardiovascular Denies history of Angina, Arrhythmia, Congestive Heart Failure, Coronary Artery Disease, Deep Vein Thrombosis, Hypertension, Hypotension, Myocardial Infarction, Peripheral Arterial Disease, Peripheral Venous Disease, Phlebitis, Vasculitis Gastrointestinal Denies history of Cirrhosis , Colitis, Crohn s, Hepatitis A, Hepatitis B, Hepatitis C Endocrine Denies history of Type I Diabetes, Type II Diabetes Genitourinary Denies history of End Stage Renal Disease Immunological Denies history of Lupus Erythematosus, Raynaud s, Scleroderma Integumentary (Skin) Denies history of History of Burn, History of pressure wounds Musculoskeletal Denies history of Gout, Rheumatoid Arthritis, Osteoarthritis, Osteomyelitis Neurologic Denies history of Dementia, Neuropathy, Quadriplegia, Paraplegia, Seizure Disorder Oncologic Denies history of Received Chemotherapy, Received Radiation Psychiatric Denies history of Anorexia/bulimia, Confinement Anxiety Review of Systems (ROS) Constitutional Symptoms (General Health) The patient has no complaints or symptoms. Eyes Lites, Jayla ([id number]) Complains or has symptoms of Glasses / Contacts. Ear/Nose/Mouth/Throat The patient has no complaints or symptoms. Hematologic/Lymphatic The patient has no complaints or  symptoms. Respiratory The patient has no complaints or symptoms. Cardiovascular The patient has no complaints or symptoms. Gastrointestinal The patient has no complaints or symptoms. Endocrine The patient has no complaints or symptoms. Genitourinary The patient has no complaints or symptoms. Immunological The patient has no complaints or symptoms. Integumentary (Skin) The patient has no complaints or symptoms. Musculoskeletal The patient has no complaints or symptoms. Neurologic The patient has no complaints or symptoms. Oncologic The patient has no complaints or symptoms. Psychiatric The patient has no complaints or symptoms. Medications: Tylenol, Pamelor, Jobst knee-high compression stockings. Objective Constitutional Pulse regular. Respirations normal and unlabored. Afebrile. Vitals Time Taken: 9:13 AM, Height: 68 in, Source: Stated, Weight: 155 lbs, Source: Measured, BMI: 23.6, Temperature: 97.8 F, Pulse: 68 bpm, Respiratory Rate: 17 breaths/min, Blood Pressure: 100/58 mmHg. [place], Anila ([id number]) Eyes Nonicteric. Reactive to light. Ears, Nose, Mouth, and Throat Lips, teeth, and gums WNL.Marland Kitchen Moist mucosa without lesions . Neck supple and nontender. No palpable supraclavicular or cervical adenopathy. Normal sized without goiter. Respiratory WNL. No retractions.. Cardiovascular Pedal Pulses WNL. ABI on the right is 1.38.Marland Kitchen No clubbing, cyanosis but has +1 edema of the right lower extremity and she has skin changes of stasis on the right leg.. Chest Breasts symmetical and no nipple discharge.. Breast tissue WNL, no masses, lumps, or tenderness.. Gastrointestinal (GI) Abdomen without masses or tenderness.. No liver or spleen enlargement or tenderness.. Lymphatic No adneopathy. No adenopathy. No adenopathy. Musculoskeletal Adexa without tenderness or enlargement.. Digits and nails w/o clubbing, cyanosis, infection,  petechiae, ischemia, or inflammatory  conditions.Marland Kitchen Psychiatric Judgement and insight Intact.. No evidence of depression, anxiety, or agitation.. General Notes: has +1 edema of the right lower extremity and she has skin changes of stasis on the right leg. no open ulceration or cellulitis. Integumentary (Hair, Skin) No suspicious lesions. No crepitus or fluctuance. No peri-wound warmth or erythema. No masses.. Assessment Active Problems ICD-10 I87.301 - Chronic venous hypertension (idiopathic) without complications of right lower extremity I89.0 - Lymphedema, not elsewhere classified Alison Fox, Alison Fox ([id number]) This 44 year old patient has chronic left lower extremity lymphedema which is being managed by the occupational therapist at the lymphedema clinic at [facility]. Though her right leg does not have florid ulcerations, there are subtle signs of venous hypertension and stasis dermatitis. I'm going to recommend a venous reflux study of the right lower extremity and see her back with this. If there is indeed appropriate surgical care to be taken we would point in the right direction. management discussed with the patient and her mother was at the bedside and they're both understanding the plan. Plan Follow-up Appointments: Other: - After VVS appointment in [place] Consults ordered were: Vascular - Right venou duplex ;reflux This 44 year old patient has chronic left lower extremity lymphedema which is being managed by the occupational therapist at the lymphedema clinic at [facility] Though her right leg does not have florid ulcerations, there are subtle signs of venous hypertension and stasis dermatitis. I'm going to recommend a venous reflux study of the right lower extremity and see her back with this. If there is indeed appropriate surgical care to be taken we would point in the right direction. management discussed with the patient and her mother was at the bedside and  they're both understanding the plan. Electronic Signature(s) Signed: 08/29/2015 10:18:51 AM By: Evlyn Kanner MD, FACS Entered By: Evlyn Kanner on 08/29/2015 10:18:50 Alison Fox, Alison Fox ([id number]) [place], Alison Fox ([id number]) -------------------------------------------------------------------------------- ROS/PFSH Details Patient Name: Alison Fox, Alison Fox 08/29/2015 9:00 Date of Service: AM Medical Record [id number] Number: Patient Account Number: [id number] Jul 20, 1971 (43 y.o. Afful, RN, BSN, Date of Birth/Sex: Treating RN: Female) Psychologist, clinical Primary Care Physician: Alison Fox Other Clinician: Referring Physician: Treating Karel Turpen Physician/Extender: Weeks in Treatment: 0 Information Obtained From Patient Caregiver Wound History Do you currently have one or more open woundso No Have you tested positive for osteomyelitis (bone infection)o No Have you had any tests for circulation on your legso No Have you had other problems associated with your woundso Swelling Eyes Complaints and Symptoms: Positive for: Glasses / Contacts Medical History: Negative for: Cataracts; Glaucoma Constitutional Symptoms (General Health) Complaints and Symptoms: No Complaints or Symptoms Ear/Nose/Mouth/Throat Complaints and Symptoms: No Complaints or Symptoms Medical History: Negative for: Chronic sinus problems/congestion; Middle ear problems Hematologic/Lymphatic Complaints and Symptoms: No Complaints or Symptoms Medical History: Negative for: Anemia; Hemophilia; Human Immunodeficiency Virus; Lymphedema; Sickle Cell Disease Respiratory Complaints and Symptoms: No Complaints or Symptoms Alison Fox, Alison Fox ([id number]) Medical History: Negative for: Aspiration; Asthma; Chronic Obstructive Pulmonary Disease (COPD); Pneumothorax; Sleep Apnea; Tuberculosis Cardiovascular Complaints and Symptoms: No Complaints or Symptoms Medical History: Negative for: Angina; Arrhythmia; Congestive Heart  Failure; Coronary Artery Disease; Deep Vein Thrombosis; Hypertension; Hypotension; Myocardial Infarction; Peripheral Arterial Disease; Peripheral Venous Disease; Phlebitis; Vasculitis Gastrointestinal Complaints and Symptoms: No Complaints or Symptoms Medical History: Negative for: Cirrhosis ; Colitis; Crohnos; Hepatitis A; Hepatitis B; Hepatitis C Endocrine Complaints and Symptoms: No Complaints or Symptoms Medical History: Negative for: Type I Diabetes; Type II Diabetes Genitourinary Complaints and Symptoms: No Complaints or  Symptoms Medical History: Negative for: End Stage Renal Disease Immunological Complaints and Symptoms: No Complaints or Symptoms Medical History: Negative for: Lupus Erythematosus; Raynaudos; Scleroderma Integumentary (Skin) Complaints and Symptoms: No Complaints or Symptoms Huang, Chevonne ([id number]) Medical History: Negative for: History of Burn; History of pressure wounds Musculoskeletal Complaints and Symptoms: No Complaints or Symptoms Medical History: Negative for: Gout; Rheumatoid Arthritis; Osteoarthritis; Osteomyelitis Neurologic Complaints and Symptoms: No Complaints or Symptoms Medical History: Negative for: Dementia; Neuropathy; Quadriplegia; Paraplegia; Seizure Disorder Oncologic Complaints and Symptoms: No Complaints or Symptoms Medical History: Negative for: Received Chemotherapy; Received Radiation Psychiatric Complaints and Symptoms: No Complaints or Symptoms Medical History: Negative for: Anorexia/bulimia; Confinement Anxiety Family and Social History Cancer: Yes - Siblings; Diabetes: Yes - Maternal Grandparents; Heart Disease: Yes - Mother, Maternal Grandparents; Hereditary Spherocytosis: No; Hypertension: Yes - Siblings, Mother, Maternal Grandparents; Kidney Disease: No; Lung Disease: No; Seizures: Yes - Siblings; Stroke: No; Thyroid Problems: No; Tuberculosis: No; Never smoker; Marital Status - Single; Alcohol Use:  Never; Drug Use: No History; Caffeine Use: Daily; Financial Concerns: No; Food, Clothing or Shelter Needs: No; Support System Lacking: No; Advanced Directives: No; Patient does not want information on Advanced Directives; Living Will: No Physician Affirmation I have reviewed and agree with the above information. Electronic Signature(s) Signed: 08/29/2015 10:14:49 AM By: Evlyn KannerBritto, Carianne Taira MD, FACS Signed: 08/29/2015 4:14:13 PM By: Elpidio EricAfful, Alison BSN, RN Previous Signature: 08/29/2015 9:22:12 AM Version By: Elpidio EricAfful, Alison BSN, RN [place], [place] ([id number]) Entered By: Evlyn KannerBritto, Hortense Cantrall on 08/29/2015 10:14:49 Merrilee SeashoreMCCRAW, Anajah ([id number]) -------------------------------------------------------------------------------- SuperBill Details Patient Name: Merrilee SeashoreMCCRAW, Tommye Date of Service: 08/29/2015 Medical Record Patient Account Number: [id number] Number: Afful, RN, BSN, Treating RN: 07/25/1971 (43 y.o. Talpa Sinkita Date of Birth/Sex: Female) Other Clinician: Primary Care Physician: Alison HongVINCENT, DUNCAN Treating Elianny Buxbaum Referring Physician: Physician/Extender: Alison AdeWeeks in Treatment: 0 Diagnosis Coding ICD-10 Codes Code Description I87.301 Chronic venous hypertension (idiopathic) without complications of right lower extremity I89.0 Lymphedema, not elsewhere classified Facility Procedures CPT4 Code: [id number] Description: WOUND CARE VISIT-LEV 3 NEW PT Modifier: Quantity: 1 Physician Procedures CPT4: Description Modifier Quantity Code [id number] - WC PHYS LEVEL 4 - NEW PT 1 ICD-10 Description Diagnosis I87.301 Chronic venous hypertension (idiopathic) without complications of right lower extremity I89.0 Lymphedema, not elsewhere classified Electronic Signature(s) Signed: 08/29/2015 4:05:33 PM By: Elpidio EricAfful, Alison BSN, RN Signed: 08/29/2015 4:07:25 PM By: Evlyn KannerBritto, Maydelin Deming MD, FACS Previous Signature: 08/29/2015 10:19:04 AM Version By: Evlyn KannerBritto, Madilynn Montante MD, FACS Entered By: Elpidio EricAfful, Alison on 08/29/2015 16:05:32

## 2015-09-02 ENCOUNTER — Ambulatory Visit: Payer: Medicare Other | Admitting: Occupational Therapy

## 2015-09-02 DIAGNOSIS — I890 Lymphedema, not elsewhere classified: Secondary | ICD-10-CM

## 2015-09-02 NOTE — Therapy (Signed)
[place] MAIN [facility] SERVICES [address] [place], [place], [address] Phone: [phone]   Fax:  [phone]  Occupational Therapy Treatment  Patient Details  Name: Alison Fox MRN: [id number] Date of Birth: 1971/01/23 No Data Recorded  Encounter Date: 09/02/2015      OT End of Session - 09/02/15 1207    Visit Number 20   Number of Visits 36   Date for OT Re-Evaluation 10/01/15   OT Start Time 1105   OT Stop Time 1203   OT Time Calculation (min) 58 min   Activity Tolerance Patient tolerated treatment well;No increased pain   Behavior During Therapy [facility] for tasks assessed/performed      Past Medical History  Diagnosis Date  . Lymphedema     Chronic following left knee surgery in 2000.  Marland Kitchen Headache     Past Surgical History  Procedure Laterality Date  . Knee arthroscopy  2000    Left knee.     There were no vitals filed for this visit.  Visit Diagnosis:  Lymphedema      Subjective Assessment - 09/02/15 1113    Subjective  Pt presents for OT Rx visit 20 for CDT to BLE w/ LLE compression wraps in place.Pt reporting soreness in both legs today. Pt reports no difficulties w/ LE self care over the weekend.   Patient Stated Goals get the swelling down and keep it down   Currently in Pain? No/denies                      OT Treatments/Exercises (OP) - 09/02/15 0001    ADLs   ADL Education Given Yes   Manual Therapy   Manual Therapy Edema management;Manual Lymphatic Drainage (MLD);Compression Bandaging;Other (comment)   Manual therapy comments Point tenderness to R distal medial leg w/ skin care an palpation. Venous ulcer appears to be forming in the area of previous  trauma. No open wound yet, but skin is reddened under the skin and warm to touch in keeping w/ inflamation   Soft tissue mobilization fibrosis technique at L medial and lateral malleoli   Manual Lymphatic Drainage (MLD) MLD to LLE as est.   Compression Bandaging . No noe wraps today 2/2 soreness. RLE with light single layer ace wrap from base of toes to below knee.Added comprex foam kidneys to paosterir malleoli. LLE gradient compression wraps applied circumferentgradient configuration frcotton stockinett; 8 cm x 1 to foot and ankle, 10 cm x 2, then  12 cm x 2- all layered over .04 x 10 cm and 12 cm Rosidol Soft foam from A-G.   Other Manual Therapy skin care BLE                OT Education - 09/02/15 1121    Education provided Yes   Education Details Continued skilled Pt/caregiver Education  And LE ADL training throughout visit for lymphedema self care components, including compression wrapping, compression garment and device wear/care, lymphatic pumping ther ex, simple self-MLD, and skin care. Discussed progress towards goals.   Person(s) Educated Patient;Parent(s)   Methods Explanation;Demonstration   Comprehension Verbalized understanding;Need further instruction             OT Long Term Goals - 09/02/15 1205    OT LONG TERM GOAL #1   Title Pt able to correctly apply gradient compression wraps to below knee with moderate caregiver assistance for optimal limb volume reduction and improvement in tissue integrity.   Baseline dependent  Time 2   Period Weeks   Status Achieved   OT LONG TERM GOAL #2   Title Pt to achieve 15% limb volume reduction in LLE, and 10% reduction in RLE by DC to limit lymphedema (LE) progression and infection risk.- NEW GOAL 25%   Baseline dependent   Time 12   Status Partially Met   OT LONG TERM GOAL #3   Title Pt > 75% compliant with all daily LE self-care protocols w/ moderate caregiver assistance, including simple self-manual lymphatic drainage (MLD), skin care, lymphatic pumping therex, and donning/ doffing progression garments.   Baseline dependent   Time 12   Period Weeks   Status Partially Met   OT LONG TERM GOAL #6   Title During Management Phase CDT Pt to sustain limb  volume reductions achieved during Intensive Phase CDT within 5% utilizing LE self-care protocols, appropriate compression garments/ devices, and moderate caregiver assistance.   Baseline dependent   Time 6   Status On-going               Plan - Sep 05, 2015 1159    Clinical Impression Statement RLE with increased swelling again today. Pt denies being out of compression for inordinate amout of time, or eating salty foods over the weekend. RLE point tenderness, redness, local swelling and increased skin temperature at medical leg is unchanged today since last visit. Skin hydration continues to improve bilaterally. Pt needing frequent reminders to complete LE self care between visits.    Pt will benefit from skilled therapeutic intervention in order to improve on the following deficits (Retired) Difficulty walking;Impaired flexibility;Increased edema;Decreased knowledge of precautions;Decreased skin integrity;Pain;Decreased balance;Decreased knowledge of use of DME;Decreased scar mobility;Decreased mobility          G-Codes - 09-05-15 1206    Functional Assessment Tool Used anatomical and volumetric measurements, clinical judgement, interview, medical hx   Functional Limitation Self care   Self Care Current Status (Z7471) At least 40 percent but less than 60 percent impaired, limited or restricted   Self Care Goal Status (T9539) At least 20 percent but less than 40 percent impaired, limited or restricted      Problem List Patient Active Problem List   Diagnosis Date Noted  . Episodic tension-type headache, not intractable 07/22/2015  . Visit for suture removal 06/25/2015  . Hydrocephalus, adult 09/10/2014  . Glaucoma 08/22/2014  . Vision decreased 08/22/2014  . Frontal headache 08/21/2014  . Right leg swelling 02/27/2014  . Chest pain on respiration 02/27/2014  . Postural dizziness 04/26/2013  . Fatigue 04/26/2013  . Abnormality of gait 01/25/2013  . Preventative health care  05/18/2011  . Lymphedema 08/06/2006    Andrey Spearman, MS, OTR/L, [facility] 09-05-2015 12:09 PM   [place] MAIN [facility] SERVICES [address] [place], [place], [address] Phone: [phone]   Fax:  [phone]  Name: Alison Fox MRN: [id number] Date of Birth: 10-05-1971

## 2015-09-02 NOTE — Patient Instructions (Signed)
LE instructions and precautions as established- see initial eval.   

## 2015-09-04 ENCOUNTER — Other Ambulatory Visit: Payer: Self-pay | Admitting: *Deleted

## 2015-09-04 ENCOUNTER — Ambulatory Visit: Payer: Medicare Other | Admitting: Occupational Therapy

## 2015-09-04 ENCOUNTER — Telehealth: Payer: Self-pay

## 2015-09-04 DIAGNOSIS — L819 Disorder of pigmentation, unspecified: Secondary | ICD-10-CM

## 2015-09-04 DIAGNOSIS — I890 Lymphedema, not elsewhere classified: Secondary | ICD-10-CM

## 2015-09-04 DIAGNOSIS — M79661 Pain in right lower leg: Secondary | ICD-10-CM

## 2015-09-04 NOTE — Telephone Encounter (Signed)
Unable to get in touch with patient.  Spoke with pt dad who reports she has gone to work for the afternoon. Pt does not have a cell phone.  I will call her in the morning.  Aggie Cosierheresa with rehab did not have any other numbers for patient.

## 2015-09-04 NOTE — Patient Instructions (Signed)
LE instructions and precautions as established- see initial eval.  09/04/15:  1) NO COMPRESSION, MLD OR SKIN CARE TO RLE UNTIL PENDING DOPLER STUDY COMPLETE AND POSSIBLE DVT RULED OUT.

## 2015-09-04 NOTE — Therapy (Signed)
[place] MAIN [facility] SERVICES [address] [place], [place], [address] Phone: [phone]   Fax:  [phone]  Occupational Therapy Treatment  Patient Details  Name: Alison Fox MRN: [id number] Date of Birth: 1970-11-19 No Data Recorded  Encounter Date: 09/04/2015      OT End of Session - 09/04/15 1224    Visit Number 21   Number of Visits 36   Date for OT Re-Evaluation 10/01/15   OT Start Time 1110   OT Stop Time 1215   OT Time Calculation (min) 65 min   Activity Tolerance Patient tolerated treatment well;No increased pain   Behavior During Therapy [facility] for tasks assessed/performed      Past Medical History  Diagnosis Date  . Lymphedema     Chronic following left knee surgery in 2000.  Marland Kitchen Headache     Past Surgical History  Procedure Laterality Date  . Knee arthroscopy  2000    Left knee.     There were no vitals filed for this visit.  Visit Diagnosis:  Lymphedema      Subjective Assessment - 09/04/15 1117    Subjective  Pt presents for OT Rx visit 21 for CDT to BLE w/ LLE compression wraps in place.Pt reporting soreness in R leg today. at medial calf and at anterior groin. "It's real sore...like about a 5 -6/ 10. It started last night at the top. Its just a sore aching feeling."    Patient is accompained by: Family member   Pertinent History onset in 12s and positive family history for maternal grandmother and cousin suggests primary LE Tarda; arthroscopic knee sx ~ 2000?, recent fall w/ head laceration. 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)   Limitations difficulty walking, decreased balance, suspected BLE suspected, R>L,     Patient Stated Goals get the swelling down and keep it down   Pain Score 6    Pain Location Leg   Pain Orientation Right   Pain Descriptors / Indicators Aching   Pain Type Acute pain   Pain Onset More than a month ago   Pain Frequency Constant                       OT Treatments/Exercises (OP) - 09/04/15 0001    ADLs   ADL Education Given Yes   Manual Therapy   Manual Therapy Edema management;Manual Lymphatic Drainage (MLD);Compression Bandaging;Other (comment)   Manual therapy comments Point tenderness to R distal medial leg and anterior R thigh. NO SKIN CARE< COMPRESSION OR MLD TO RLE TODAY OR DURING VISIT INTERVAL AS POSSIBLE DVT PRECAUTIONS   Soft tissue mobilization fibrosis technique at L medial and lateral malleoli   Manual Lymphatic Drainage (MLD) MLD to LLE as est.   Compression Bandaging NO LE wrap  Resumed LLE toe wrap. Break today from supplemental malleolar pads today. LLE gradient compression wraps applied circumferentgradient configuration frcotton stockinett; 8 cm x 1 to foot and ankle, 10 cm x 2, then  12 cm x 2- all layered over .04 x 10 cm and 12 cm Rosidol Soft foam from A-G.   Other Manual Therapy skin care BLE                     OT Long Term Goals - 09/02/15 1205    OT LONG TERM GOAL #1   Title Pt able to correctly apply gradient compression wraps to below knee with moderate  caregiver assistance for optimal limb volume reduction and improvement in tissue integrity.   Baseline dependent    Time 2   Period Weeks   Status Achieved   OT LONG TERM GOAL #2   Title Pt to achieve 15% limb volume reduction in LLE, and 10% reduction in RLE by DC to limit lymphedema (LE) progression and infection risk.- NEW GOAL 25%   Baseline dependent   Time 12   Status Partially Met   OT LONG TERM GOAL #3   Title Pt > 75% compliant with all daily LE self-care protocols w/ moderate caregiver assistance, including simple self-manual lymphatic drainage (MLD), skin care, lymphatic pumping therex, and donning/ doffing progression garments.   Baseline dependent   Time 12   Period Weeks   Status Partially Met   OT LONG TERM GOAL #6   Title During Management Phase CDT Pt to sustain limb volume reductions  achieved during Intensive Phase CDT within 5% utilizing LE self-care protocols, appropriate compression garments/ devices, and moderate caregiver assistance.   Baseline dependent   Time 6   Status On-going               Plan - 09/04/15 1225    Clinical Impression Statement Pt continues to c/o R medial calf pain, and today complains of sudden onset R anterior thigh pain. Mild increase in redness and discoloration is noted at R medial callf today and mild increase in swelling is noted and until cleared by referring MD.  OT spoke w/ triage nurse, Marliss Czar, at [facility]  by phone expressiong concerns and requested referral for diagnostic Dopler. Pt and parent verbalized understanding of  instructions to DC all RLE skin care, MLD, and compression until pending Dopler study is  complete to rule out DVT . LLE CDT continues to    Pt will benefit from skilled therapeutic intervention in order to improve on the following deficits (Retired) Difficulty walking;Impaired flexibility;Increased edema;Decreased knowledge of precautions;Decreased skin integrity;Pain;Decreased balance;Decreased knowledge of use of DME;Decreased scar mobility;Decreased mobility   Rehab Potential Good   Clinical Impairments Affecting Rehab Potential decreased balance, difficulty walkingL decreased L knee akle and foot range of motion 2/2 girth and decreased tissue flexibility at joints, falls risk, infection risk, impaired cognition needed for learning LE self cae   OT Frequency 3x / week   OT Duration 12 weeks   OT Treatment/Interventions Self-care/ADL training;DME and/or AE instruction;Manual lymph drainage;Patient/family education;Compression bandaging;Therapeutic exercises;Scar mobilization;Manual Therapy   Plan Provided garment vendors contact info and instructed Pt to call and check on garment status. Provided printed  referring MD clinic contact info for reference and instructed Pt that she would hear from  their office ASAP w/  instructions and vascular clinic directions closer to home in [place].        Problem List Patient Active Problem List   Diagnosis Date Noted  . Episodic tension-type headache, not intractable 07/22/2015  . Visit for suture removal 06/25/2015  . Hydrocephalus, adult 09/10/2014  . Glaucoma 08/22/2014  . Vision decreased 08/22/2014  . Frontal headache 08/21/2014  . Right leg swelling 02/27/2014  . Chest pain on respiration 02/27/2014  . Postural dizziness 04/26/2013  . Fatigue 04/26/2013  . Abnormality of gait 01/25/2013  . Preventative health care 05/18/2011  . Lymphedema 08/06/2006    Loel Dubonnet, MS, OTR/L, [facility] 09/04/2015 12:33 PM   [place] [facility] MAIN [facility] SERVICES [address] [place], [place], [address] Phone: [phone]   Fax:  [phone]  Name: Laquitha Heslin MRN: [id number] Date of Birth: 1971/07/23

## 2015-09-04 NOTE — Telephone Encounter (Signed)
Pt needs to be seen. [facility]. If it is thigh DVT, high risk of embolism so would need started on anti-coag until doppler can be obtained which would mean risk / benefit of anti-coag would need to be discussed and cannot do that over phone. [facility] UC today.

## 2015-09-04 NOTE — Telephone Encounter (Signed)
Thanks

## 2015-09-04 NOTE — Telephone Encounter (Signed)
Received call from Loel Dubonnetheresa Gilliam occupational therapist/lymphedema specialist with [facility] rehab.   Alison Cosierheresa has been working on LLE and is seeing improvements. Approx 2 weeks ago pt started complaining of pain to right medial calf and has an area that looked like venous ulcer.  She has been seen at wound clinic for the ulcer, but today is complaining of pain (soreness/aching) to her right proximal thigh.  Pain 6/10. Alison Cosierheresa requesting order for doppler study to rule out DVT. Please advise.

## 2015-09-05 ENCOUNTER — Other Ambulatory Visit: Payer: Self-pay | Admitting: Internal Medicine

## 2015-09-05 ENCOUNTER — Ambulatory Visit (HOSPITAL_COMMUNITY)
Admission: RE | Admit: 2015-09-05 | Discharge: 2015-09-05 | Disposition: A | Discharge status: Home / Self Care | Payer: Medicare Other | Source: Ambulatory Visit | Attending: Internal Medicine | Admitting: Internal Medicine

## 2015-09-05 ENCOUNTER — Ambulatory Visit: Payer: Medicare Other | Admitting: Internal Medicine

## 2015-09-05 ENCOUNTER — Encounter: Payer: Medicare Other | Admitting: Surgery

## 2015-09-05 ENCOUNTER — Ambulatory Visit (INDEPENDENT_AMBULATORY_CARE_PROVIDER_SITE_OTHER): Payer: Medicare Other | Admitting: Internal Medicine

## 2015-09-05 ENCOUNTER — Encounter: Payer: Self-pay | Admitting: Internal Medicine

## 2015-09-05 VITALS — BP 99/61 | HR 87 | Temp 97.1°F | Ht 67.0 in | Wt 155.1 lb

## 2015-09-05 DIAGNOSIS — Z872 Personal history of diseases of the skin and subcutaneous tissue: Secondary | ICD-10-CM | POA: Diagnosis not present

## 2015-09-05 DIAGNOSIS — Z09 Encounter for follow-up examination after completed treatment for conditions other than malignant neoplasm: Secondary | ICD-10-CM | POA: Diagnosis not present

## 2015-09-05 DIAGNOSIS — M79604 Pain in right leg: Secondary | ICD-10-CM

## 2015-09-05 DIAGNOSIS — M7989 Other specified soft tissue disorders: Secondary | ICD-10-CM | POA: Diagnosis not present

## 2015-09-05 DIAGNOSIS — I890 Lymphedema, not elsewhere classified: Secondary | ICD-10-CM | POA: Diagnosis not present

## 2015-09-05 DIAGNOSIS — M79651 Pain in right thigh: Secondary | ICD-10-CM | POA: Insufficient documentation

## 2015-09-05 DIAGNOSIS — I87301 Chronic venous hypertension (idiopathic) without complications of right lower extremity: Secondary | ICD-10-CM | POA: Diagnosis not present

## 2015-09-05 NOTE — Progress Notes (Signed)
Patient ID: Alison SeashoreFrances Cinnamon, female   DOB: 20-Nov-1970, 44 y.o.   MRN: [id number]   Subjective:   Patient ID: Alison SeashoreFrances Bleau female   DOB: 20-Nov-1970 44 y.o.   MRN: [id number]  HPI: Ms.Brylea Armandina GemmaMcCraw is a 44 y.o. female with a history of chronic left leg lymphedema of unknown etiology presents to clinic today with complaints of left leg pain. She says that it started 3-4 weeks ago in her right shin. She is seen by OT for her lymphedema and had venous status ulcer changes when she first started complaining of the pain. She began complaining of right proximal thigh pain at her visit yesterday and was told to see us for further evaluation. Today, she continues to complain of right shin pain with pain on palpation of her right calf. Does not report any right thigh pain but does describe right hip pain that occasional radiates down her right leg. Her only medication is amitriptyline, no OCP or estrogen therapy. Denies any trauma, recent travel or surgeries. Denies any SOB or chest pain.    Past Medical History  Diagnosis Date  . Lymphedema     Chronic following left knee surgery in 2000.  Marland Kitchen. Headache    Current Outpatient Prescriptions  Medication Sig Dispense Refill  . acetaminophen (TYLENOL) 325 MG tablet Take 650 mg by mouth every 6 (six) hours as needed for pain.    . Elastic Bandages & Supports (JOBST KNEE HIGH COMPRESSION SM) MISC Wear supports throughout the day. 2 each 0  . nortriptyline (PAMELOR) 50 MG capsule Take 1 capsule (50 mg total) by mouth at bedtime. 60 capsule 2   No current facility-administered medications for this visit.   Family History  Problem Relation Age of Onset  . Heart disease Maternal Aunt   . Heart disease Maternal Uncle   . Stroke Paternal Uncle   . Cancer Paternal Uncle     Melanoma  . Diabetes Maternal Grandmother   . Diabetes Paternal Grandfather    Social History   Social History  . Marital Status: Single    Spouse Name: N/A  . Number of Children:  N/A  . Years of Education: N/A   Social History Main Topics  . Smoking status: Never Smoker   . Smokeless tobacco: Never Used  . Alcohol Use: No  . Drug Use: No  . Sexual Activity: No   Other Topics Concern  . None   Social History Narrative   Review of Systems: Review of Systems  Respiratory: Negative for shortness of breath and wheezing.   Cardiovascular: Negative for chest pain.  Musculoskeletal: Positive for joint pain. Negative for falls.  Skin: Positive for rash.  Neurological: Negative for dizziness and sensory change.   Objective:  Physical Exam: Filed Vitals:   09/05/15 1435  BP: 99/61  Pulse: 87  Temp: 97.1 F (36.2 C)  TempSrc: Oral  Height: 5\' 7"  (1.702 m)  Weight: 155 lb 1.6 oz (70.353 kg)  SpO2: 99%   PHYSICAL EXAM GENERAL- alert, co-operative, appears as stated age, not in any distress. HEENT- Atraumatic, normocephalic, PERRL, EOMI, oral mucosa appears moist CARDIAC- RRR, no murmurs, rubs or gallops. RESP- Moving equal volumes of air, and clear to auscultation bilaterally, no wheezes or crackles. ABDOMEN- Soft, nontender, bowel sounds present. NEURO- No obvious Cr N abnormality. EXTREMITIES- left leg wrapped with marked lymphedema present. Right leg with venous ulceration changed on shin. Tender to palpation in right calf. Tender to palpation over right hip.  PSYCH- Normal mood and  affect, appropriate thought content and speech.  Assessment & Plan:  Case discussed with Dr. Heide Spark. Please refer to Problem based charting for further details of today's visit.

## 2015-09-05 NOTE — Assessment & Plan Note (Addendum)
She says that it started 3-4 weeks ago in her right shin. She is seen by OT for her lymphedema and had venous status ulcer changes when she first started complaining of the pain. She began complaining of right proximal thigh pain at her visit yesterday and was told to see us for further evaluation. Today, she continues to complain of right shin pain with pain on palpation of her right calf. Does not report any right thigh pain but does describe right hip pain that occasional radiates down her right leg. Her only medication is amitriptyline, no OCP or estrogen therapy. Denies any trauma, recent travel or surgeries. Denies any SOB or chest pain.   Will get RLE doppler to rule out DVT X-ray of right hip. Suspect this may be bursitis vs early osteoarthritic changes. Likely unrelated to right calf pain. Will consider Voltaren gel prn if x-rays are unrevealing.   ADDENDUM Doppler negative for DVT  X-ray with no abnormalities Will give Rx for voltaren gel prn

## 2015-09-05 NOTE — Patient Instructions (Signed)
Thank you for coming in today  -We will get you set up to have an ultrasound of your left to make sure there are not any blood clots -We will get an x-ray of your hip as well. I will call you with the results.

## 2015-09-05 NOTE — Telephone Encounter (Signed)
Pt added to this mornings schedule with Dr. Michela PitcherBoswell FYI

## 2015-09-05 NOTE — Progress Notes (Signed)
*  Preliminary Results* Right lower extremity venous duplex completed. Right lower extremity is negative for deep vein thrombosis. There is no evidence of right Baker's cyst.  09/05/2015 4:47 PM  Gertie FeyMichelle Jasmarie Coppock, RVT, RDCS, RDMS

## 2015-09-06 ENCOUNTER — Ambulatory Visit: Payer: Medicare Other | Admitting: Occupational Therapy

## 2015-09-06 DIAGNOSIS — I890 Lymphedema, not elsewhere classified: Secondary | ICD-10-CM | POA: Diagnosis not present

## 2015-09-06 MED ORDER — DICLOFENAC SODIUM 1 % TD GEL: 1.0000 "application " | Freq: Four times a day (QID) | TRANSDERMAL | Status: DC

## 2015-09-06 NOTE — Patient Instructions (Signed)
.  LE instructions and precautions as established- see initial eval.  Reiterated : DO NOT SLEEP IN COMPRESSION GARMNTS

## 2015-09-06 NOTE — Therapy (Signed)
[place] [facility] SERVICES [address] [place], [place], [address] Phone: [phone]   Fax:  [phone]  Occupational Therapy Treatment  Patient Details  Name: Alison Fox MRN: [id number] Date of Birth: 1971/07/01 No Data Recorded  Encounter Date: 09/06/2015      OT End of Session - 09/06/15 1235    Visit Number 22   Number of Visits 36   Date for OT Re-Evaluation 10/01/15   OT Start Time 1100   OT Stop Time 1218   OT Time Calculation (min) 78 min   Activity Tolerance Patient tolerated treatment well;No increased pain   Behavior During Therapy [facility] for tasks assessed/performed      Past Medical History  Diagnosis Date  . Lymphedema     Chronic following left knee surgery in 2000.  Marland Kitchen Headache     Past Surgical History  Procedure Laterality Date  . Knee arthroscopy  2000    Left knee.     There were no vitals filed for this visit.  Visit Diagnosis:  Lymphedema      Subjective Assessment - 09/06/15 1118    Subjective  (p) Pt presents for OT Rx visit 21 for CDT to BLE w/ LLE compression wraps in place.Pt reports she saw new internal medicine doctor and had a dopler study of RLE yesterday. Pt also saw vascular specialist, Dr. Con Memos, yesterday. Pt is poor historian and is unable to articulate findings and POC. Dopler study was negative according to record review of available IM notes.   Patient is accompained by: (p) Family member   Pertinent History (p) onset in 67s and positive family history for maternal grandmother and cousin suggests primary LE Tarda; arthroscopic knee sx ~ 2000?, recent fall w/ head laceration. 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 (p) No/denies   Pain Onset (p) 1 to 4 weeks ago  2 weeks ago                       OT Treatments/Exercises (OP) - 09/06/15 0001    ADLs   ADL Education Given Yes   Manual Therapy   Manual Therapy Edema management;Manual Lymphatic Drainage (MLD);Compression Bandaging   Manual therapy comments Point tenderness to R distal medial leg and anterior R thigh.  No sign of infection. Scbbed over lesian at area of discoloration and pain at    Edema Management anatomical measurements for RLE OTS knee high: B=29 cm, C= 37 cm; L= 40 cm   Soft tissue mobilization fibrosis technique at L medial and lateral malleoli   Manual Lymphatic Drainage (MLD) MLD to LLE as est.   Compression Bandaging Loaned ccl 2 Juzo soft knee high with open toe- sz 3, for weekend. Pt understands she is NOT TO SLEEP IN RLE COMPRESSION STOCKING.LLE gradient compression wraps applied circumferentgradient configuration frcotton stockinett; 8 cm x 1 to foot and ankle, 10 cm x 2, then  12 cm x 2- all layered over .04 x 10 cm and 12 cm Rosidol Soft foam from A-G.   Other Manual Therapy skin care BLE                OT Education - 09/06/15 1232    Education provided Yes   Education Details  Emphasis of LE self care traing on teaching donning and doffing RLE off-the-shelf garment using a variety of assistive devices. By end of session Pt able to don and doff and verbalized wear and care regimes.   Person(s) Educated Patient;Parent(s)   Methods Explanation;Verbal cues;Tactile cues;Demonstration   Comprehension Verbalized understanding;Need further instruction;Returned demonstration;Verbal cues required;Tactile cues required             OT Long Term Goals - 09/02/15 1205    OT LONG TERM GOAL #1   Title Pt able to correctly apply gradient compression wraps to below knee with moderate caregiver assistance for optimal limb volume reduction and improvement in tissue integrity.   Baseline dependent    Time 2   Period Weeks   Status Achieved   OT LONG TERM GOAL #2   Title Pt  to achieve 15% limb volume reduction in LLE, and 10% reduction in RLE by DC to limit lymphedema (LE) progression and infection risk.- NEW GOAL 25%   Baseline dependent   Time 12   Status Partially Met   OT LONG TERM GOAL #3   Title Pt > 75% compliant with all daily LE self-care protocols w/ moderate caregiver assistance, including simple self-manual lymphatic drainage (MLD), skin care, lymphatic pumping therex, and donning/ doffing progression garments.   Baseline dependent   Time 12   Period Weeks   Status Partially Met   OT LONG TERM GOAL #6   Title During Management Phase CDT Pt to sustain limb volume reductions achieved during Intensive Phase CDT within 5% utilizing LE self-care protocols, appropriate compression garments/ devices, and moderate caregiver assistance.   Baseline dependent   Time 6   Status On-going               Plan - 09/06/15 1236    Clinical Impression Statement DVT ruled out yesterday. Pt  loaned Juzo ccl 2 (30-40 mmHg)sz 3 open toe knee high today for weekend trial. Considering ccl 2  vs ccl 1 ( 20-30 mmHg) due to heavy occupational demands requiring Pt to stand and walk for 4+hours per shift. Pt will resume all LE self care between session. Ot  will add OTS knee highs to garment order next week after trial.   Pt will benefit from skilled therapeutic intervention in order to improve on the following deficits (Retired) Difficulty walking;Impaired flexibility;Increased edema;Decreased knowledge of precautions;Decreased skin integrity;Pain;Decreased balance;Decreased knowledge of use of DME;Decreased scar mobility;Decreased mobility   Rehab Potential Good   Clinical Impairments Affecting Rehab Potential decreased balance, difficulty walkingL decreased L knee akle and foot range of motion 2/2 girth and decreased tissue flexibility at joints, falls risk, infection risk, impaired cognition needed for learning LE self cae   OT Frequency 3x / week   OT Duration 12 weeks    OT Treatment/Interventions Self-care/ADL training;DME and/or AE instruction;Manual lymph drainage;Patient/family education;Compression bandaging;Therapeutic exercises;Scar mobilization;Manual Therapy        Problem List Patient Active Problem List   Diagnosis Date Noted  . Right leg pain 09/05/2015  . Episodic tension-type headache, not intractable 07/22/2015  . Hydrocephalus, adult 09/10/2014  . Glaucoma 08/22/2014  . Vision decreased 08/22/2014  . Frontal headache 08/21/2014  . Right leg swelling 02/27/2014  . Chest pain on respiration 02/27/2014  . Postural dizziness 04/26/2013  . Fatigue 04/26/2013  . Abnormality of gait 01/25/2013  . Preventative health care 05/18/2011  . Lymphedema 08/06/2006    Andrey Spearman, MS, OTR/L, CLT-LANA 09/06/2015 12:40 PM   [place]  [place], [place], [address] Phone: [phone]   Fax:  [phone]  Name: Alison Fox MRN: [id number] Date of Birth: 10-19-1971

## 2015-09-06 NOTE — Progress Notes (Signed)
Alison Fox, Alison Fox ([id number]) Visit Report for 09/05/2015 Chief Complaint Document Details Patient Name: Alison Fox, Alison Fox 09/05/2015 10:45 Date of Service: AM Medical Record [id number] Number: Patient Account Number: [id number] 09/07/1971 (44 y.o. Treating RN: Alison Fox, Alison Fox Date of Birth/Sex: Female) Other Clinician: Primary Care Treating Alison Fox, Keturah Yerby Physician: Physician/Extender: Referring Physician: Weeks in Treatment: 1 Information Obtained from: Patient Chief Complaint Patient presents to the wound care center for a consult due non healing wound. She has been having chronic lymphedema of the left lower extremity which is being treated at the lymphedema clinic at [facility]. She comes in consultation for pain and skin changes of the right lower extremity which she's been having for several months. Electronic Signature(s) Signed: 09/05/2015 11:57:57 AM By: Alison Kanner MD, FACS Entered By: Alison Fox on 09/05/2015 11:57:57 Alison Fox, Alison Fox ([id number]) -------------------------------------------------------------------------------- HPI Details Patient Name: Alison Fox 09/05/2015 10:45 Date of Service: AM Medical Record [id number] Number: Patient Account Number: [id number] 02-16-1971 (44 y.o. Treating RN: Alison Fox, Alison Fox Date of Birth/Sex: Female) Other Clinician: Primary Care Treating Alison Fox, Alison Fox Physician: Physician/Extender: Referring Physician: Weeks in Treatment: 1 History of Present Illness Location: skin changes and pain in the right lower extremity with no ulceration Quality: Patient reports experiencing a dull pain to affected area(s). Severity: Patient states wound (s) are getting better. Duration: Patient has had the wound for > 3 months prior to seeking treatment at the wound center Timing: Pain in wound is Intermittent (comes and goes Context: The skin changes appeared gradually  over time Modifying Factors: Consults to this date include: she has been seeing the lymphedema clinic for her left lower extremity. Associated Signs and Symptoms: Patient reports having increase swelling. HPI Description: 44 year old patient was referred to our clinic by her occupational therapists miss Erven Colla who has been looking after her for left lower extremity lymphedema since October 2016. I understand she noticed changes in the right lower extremity with some skin changes and tenderness and hence wanted a workup for this patient. The patient has chronic hydrocephallus, left leg lymphedema acquired post surgery in the year 2000. Patient also has frequent headaches and is under the care of neurosurgery and neurology. Past medical history significant for hydrocephallus, episodic tension headaches, lymphedema, right leg lymphedema, and glaucoma. 09/05/2015 -- the patient and her mother have not yet received the appointment from vein and vascular in [facility] regarding her workup. we have checked this up for them and given them the proper appointment in early December. Electronic Signature(s) Signed: 09/05/2015 11:59:05 AM By: Alison Kanner MD, FACS Entered By: Alison Fox on 09/05/2015 11:59:04 Alison Fox, Alison Fox ([id number]) -------------------------------------------------------------------------------- Physical Exam Details Patient Name: Alison Fox, Alison Fox 09/05/2015 10:45 Date of Service: AM Medical Record [id number] Number: Patient Account Number: [id number] November 18, 1970 (44 y.o. Treating RN: Alison Fox,  Fox Date of Birth/Sex: Female) Other Clinician: Primary Care Treating Alison Fox, Alison Fox Physician: Physician/Extender: Referring Physician: Weeks in Treatment: 1 Constitutional . Pulse regular. Respirations normal and unlabored. Afebrile. . Eyes Nonicteric. Reactive to light. Ears, Nose, Mouth, and Throat Lips, teeth, and gums WNL.Marland Kitchen Moist mucosa  without lesions . Neck supple and nontender. No palpable supraclavicular or cervical adenopathy. Normal sized without goiter. Respiratory WNL. No retractions.. Cardiovascular Pedal Pulses WNL. No clubbing, cyanosis or edema. Chest Breasts symmetical and no nipple discharge.. Breast tissue WNL, no masses, lumps, or tenderness.. Lymphatic No adneopathy. No adenopathy. No adenopathy. Musculoskeletal Adexa without tenderness or enlargement.. Digits and nails w/o clubbing, cyanosis, infection, petechiae, ischemia, or  inflammatory conditions.. Integumentary (Hair, Skin) No suspicious lesions. No crepitus or fluctuance. No peri-wound warmth or erythema. No masses.Marland Kitchen Psychiatric Judgement and insight Intact.. No evidence of depression, anxiety, or agitation.. Notes the patient has no open wound on the right lower extremity and has some pedal and lower extremity edema. There is no cellulitis. Electronic Signature(s) Signed: 09/05/2015 11:59:40 AM By: Alison Kanner MD, FACS Entered By: Alison Fox on 09/05/2015 11:59:39 Alison Fox, Alison Fox ([id number]) -------------------------------------------------------------------------------- Physician Orders Details Patient Name: Alison Fox 09/05/2015 10:45 Date of Service: AM Medical Record [id number] Number: Patient Account Number: [id number] 03-03-1971 (44 y.o. Treating RN: Alison Fox, [place] Fox Date of Birth/Sex: Female) Other Clinician: Primary Care Treating Alison Fox, Alison Fox Physician: Physician/Extender: Referring Physician: Tania Ade in Treatment: 1 Verbal / Phone Orders: Yes Clinician: Afful, RN, Fox, Alison Read Back and Verified: Yes Diagnosis Coding Discharge From [facility] o Discharge from Wound Care Center - Treatment completed. Patient scheduled on 09/23/15 to see [facility] at 10am. Appointement date given to patient. An order for juzo compression stockins also given to patient. Electronic  Signature(s) Signed: 09/05/2015 11:18:40 AM By: Alison Fox BSN, RN Signed: 09/05/2015 4:05:55 PM By: Alison Kanner MD, FACS Entered By: Alison Fox on 09/05/2015 11:18:40 Alison Fox, Alison Fox ([id number]) -------------------------------------------------------------------------------- Problem List Details Patient Name: ALEXANDER, MCAULEY 09/05/2015 10:45 Date of Service: AM Medical Record [id number] Number: Patient Account Number: [id number] [birth year] (43 y.o. Treating RN: Alison Fox, [place] Fox Date of Birth/Sex: Female) Other Clinician: Primary Care Treating Alison Fox, Cooper Stamp Physician: Physician/Extender: Referring Physician: Weeks in Treatment: 1 Active Problems ICD-10 Encounter Code Description Active Date Diagnosis I87.301 Chronic venous hypertension (idiopathic) without 08/29/2015 Yes complications of right lower extremity I89.0 Lymphedema, not elsewhere classified 08/29/2015 Yes Inactive Problems Resolved Problems Electronic Signature(s) Signed: 09/05/2015 11:56:57 AM By: Alison Kanner MD, FACS Entered By: Alison Fox on 09/05/2015 11:56:57 Alison Fox, Alison Fox ([id number]) -------------------------------------------------------------------------------- Progress Note Details Patient Name: Alison Fox, Alison Fox 09/05/2015 10:45 Date of Service: AM Medical Record [id number] Number: Patient Account Number: [id number] [birth year] (43 y.o. Treating RN: Alison Fox, [place] Fox Date of Birth/Sex: Female) Other Clinician: Primary Care Treating Alison Fox, Horald Birky Physician: Physician/Extender: Referring Physician: Weeks in Treatment: 1 Subjective Chief Complaint Information obtained from Patient Patient presents to the wound care center for a consult due non healing wound. She has been having chronic lymphedema of the left lower extremity which is being treated at the lymphedema clinic at [facility]. She comes in consultation for pain and  skin changes of the right lower extremity which she's been having for several months. History of Present Illness (HPI) The following HPI elements were documented for the patient's wound: Location: skin changes and pain in the right lower extremity with no ulceration Quality: Patient reports experiencing a dull pain to affected area(s). Severity: Patient states wound (s) are getting better. Duration: Patient has had the wound for > 3 months prior to seeking treatment at the wound center Timing: Pain in wound is Intermittent (comes and goes Context: The skin changes appeared gradually over time Modifying Factors: Consults to this date include: she has been seeing the lymphedema clinic for her left lower extremity. Associated Signs and Symptoms: Patient reports having increase swelling. 44 year old patient was referred to our clinic by her occupational therapists miss Erven Colla who has been looking after her for left lower extremity lymphedema since October 2016. I understand she noticed changes in the right lower extremity with some skin changes and tenderness and hence wanted a workup for this patient.  The patient has chronic hydrocephallus, left leg lymphedema acquired post surgery in the year 2000. Patient also has frequent headaches and is under the care of neurosurgery and neurology. Past medical history significant for hydrocephallus, episodic tension headaches, lymphedema, right leg lymphedema, and glaucoma. 09/05/2015 -- the patient and her mother have not yet received the [facility]. we have checked this up for them and given them the proper appointment in early December. Alison Fox, Alison Fox ([id number]) Objective Constitutional Pulse regular. Respirations normal and unlabored. Afebrile. Vitals Time Taken: 10:57 AM, Height: 68 in, Weight: 155 lbs, BMI: 23.6, Temperature: 97.7 F, Pulse: 70 bpm, Respiratory Rate: 17  breaths/min, Blood Pressure: 108/44 mmHg. Eyes Nonicteric. Reactive to light. Ears, Nose, Mouth, and Throat Lips, teeth, and gums WNL.Marland Kitchen. Moist mucosa without lesions . Neck supple and nontender. No palpable supraclavicular or cervical adenopathy. Normal sized without goiter. Respiratory WNL. No retractions.. Cardiovascular Pedal Pulses WNL. No clubbing, cyanosis or edema. Chest Breasts symmetical and no nipple discharge.. Breast tissue WNL, no masses, lumps, or tenderness.. Lymphatic No adneopathy. No adenopathy. No adenopathy. Musculoskeletal Adexa without tenderness or enlargement.. Digits and nails w/o clubbing, cyanosis, infection, petechiae, ischemia, or inflammatory conditions.Marland Kitchen. Psychiatric Judgement and insight Intact.. No evidence of depression, anxiety, or agitation.. General Notes: the patient has no open wound on the right lower extremity and has some pedal and lower extremity edema. There is no cellulitis. Integumentary (Hair, Skin) No suspicious lesions. No crepitus or fluctuance. No peri-wound warmth or erythema. No masses.Marland Kitchen. Alison Fox, Alison Fox ([id number]) Assessment Active Problems ICD-10 I87.301 - Chronic venous hypertension (idiopathic) without complications of right lower extremity I89.0 - Lymphedema, not elsewhere classified Due to lack of communication from the vascular office they did not have a proper understanding as to when the venous duplex study was going to be done. We have sorted this out. At the present time I have recommended that she wears a 20-30 mm compression stocking all day and remove it only at night. Details of this have been discussed with the patient and her mother very clearly and documented. Once her venous studies are done we will review this reports and if required she can be seen at the vascular surgeon's office to plan a procedure. The patient and her mother understand the treatment plan. No return appointment is given at the present time  until we have the vascular report. [facility] Discharge from Wound Care Center - Treatment completed. Patient scheduled on 09/23/15 to see [facility] at 10am. Appointement date given to patient. An order for juzo compression stockins also given to patient. Due to lack of communication from the vascular office they did not have a proper understanding as to when the venous duplex study was going to be done. We have sorted this out. At the present time I have recommended that she wears a 20-30 mm compression stocking all day and remove it only at night. Details of this have been discussed with the patient and her mother very clearly and documented. Once her venous studies are done we will review this reports and if required she can be seen at the vascular surgeon's office to plan a procedure. Alison Fox, [place] ([id number]) The patient and her mother understand the treatment plan. No return appointment is given at the present time until we have the vascular report. Electronic Signature(s) Signed: 09/05/2015 12:01:35 PM By: Alison KannerBritto, Myquan Schaumburg MD, FACS Entered By: Alison KannerBritto, Taiya Nutting on 09/05/2015 12:01:34 Zwicker, [name] ([id number]) -------------------------------------------------------------------------------- SuperBill Details  Patient Name: RHYLEI, MCQUAIG Date of Service: 09/05/2015 Medical Record Patient Account Number: [id number] Number: Alison Fox, Treating RN: 12/01/1970 (43 y.o. [place] Fox Date of Birth/Sex: Female) Other Clinician: Primary Care Physician: Erlinda Hong Treating Royston Bekele Referring Physician: Physician/Extender: Tania Ade in Treatment: 1 Diagnosis Coding ICD-10 Codes Code Description I87.301 Chronic venous hypertension (idiopathic) without complications of right lower extremity I89.0 Lymphedema, not elsewhere classified Facility Procedures CPT4 Code: 29562130 Description: [phone] - WOUND CARE VISIT-LEV 1 EST PT Modifier: Quantity:  1 Physician Procedures CPT4: Description Modifier Quantity Code [id number] - WC PHYS LEVEL 3 - EST PT 1 ICD-10 Description Diagnosis I87.301 Chronic venous hypertension (idiopathic) without complications of right lower extremity I89.0 Lymphedema, not elsewhere classified Electronic Signature(s) Signed: 09/05/2015 12:01:52 PM By: Alison Kanner MD, FACS Previous Signature: 09/05/2015 11:42:28 AM Version By: Alison Fox BSN, RN Entered By: Alison Fox on 09/05/2015 12:01:51

## 2015-09-06 NOTE — Progress Notes (Signed)
ELFRIEDE, BONINI ([id number]) Visit Report for 09/05/2015 Arrival Information Details Patient Name: Alison Fox, Alison Fox 09/05/2015 10:45 Date of Service: AM Medical Record [id number] Number: Patient Account Number: [id number] 08-Dec-1970 (43 y.o. Treating RN: Clover Mealy, RN, BSN, [place] Sink Date of Birth/Sex: Female) Other Clinician: Primary Care Physician: Erlinda Hong Treating Britto, Errol Referring Physician: Physician/Extender: Tania Ade in Treatment: 1 Visit Information History Since Last Visit Added or deleted any medications: No Patient Arrived: Ambulatory Any new allergies or adverse reactions: No Arrival Time: 10:56 Had a fall or experienced change in No Accompanied By: self activities of daily living that may affect Transfer Assistance: None risk of falls: Patient Identification Verified: Yes Signs or symptoms of abuse/neglect since last No Secondary Verification Process Yes visito Completed: Has Dressing in Place as Prescribed: Yes Patient Requires Transmission-Based No Pain Present Now: No Precautions: Patient Has Alerts: No Electronic Signature(s) Signed: 09/05/2015 10:57:43 AM By: Elpidio Eric BSN, RN Entered By: Elpidio Eric on 09/05/2015 10:57:42 Stinson, Debrah ([id number]) -------------------------------------------------------------------------------- Clinic Level of Care Assessment Details Patient Name: Alison Fox, Alison Fox 09/05/2015 10:45 Date of Service: AM Medical Record [id number] Number: Patient Account Number: [id number] Jan 01, 1971 (43 y.o. Treating RN: Afful, RN, BSN, [place] Sink Date of Birth/Sex: Female) Other Clinician: Primary Care Physician: Erlinda Hong Treating Britto, Errol Referring Physician: Physician/Extender: Tania Ade in Treatment: 1 Clinic Level of Care Assessment Items TOOL 4 Quantity Score  - Use when only an EandM is performed on FOLLOW-UP visit 0 ASSESSMENTS - Nursing Assessment / Reassessment X - Reassessment of Co-morbidities (includes  updates in patient status) 1 10 X - Reassessment of Adherence to Treatment Plan 1 5 ASSESSMENTS - Wound and Skin Assessment / Reassessment  - Simple Wound Assessment / Reassessment - one wound 0  - Complex Wound Assessment / Reassessment - multiple wounds 0  - Dermatologic / Skin Assessment (not related to wound area) 0 ASSESSMENTS - Focused Assessment  - Circumferential Edema Measurements - multi extremities 0  - Nutritional Assessment / Counseling / Intervention 0  - Lower Extremity Assessment (monofilament, tuning fork, pulses) 0  - Peripheral Arterial Disease Assessment (using hand held doppler) 0 ASSESSMENTS - Ostomy and/or Continence Assessment and Care  - Incontinence Assessment and Management 0  - Ostomy Care Assessment and Management (repouching, etc.) 0 PROCESS - Coordination of Care  - Simple Patient / Family Education for ongoing care 0  - Complex (extensive) Patient / Family Education for ongoing care 0  - Staff obtains Chiropractor, Records, Test Results / Process Orders 0  - Staff telephones HHA, Nursing Homes / Clarify orders / etc 0 Lineman, [place] ([id number])  - Routine Transfer to another Facility (non-emergent condition) 0  - Routine Hospital Admission (non-emergent condition) 0  - New Admissions / Manufacturing engineer / Ordering NPWT, Apligraf, etc. 0  - Emergency Hospital Admission (emergent condition) 0 X - Simple Discharge Coordination 1 10  - Complex (extensive) Discharge Coordination 0 PROCESS - Special Needs  - Pediatric / Minor Patient Management 0  - Isolation Patient Management 0  - Hearing / Language / Visual special needs 0  - Assessment of Community assistance (transportation, D/C planning, etc.) 0  - Additional assistance / Altered mentation 0  - Support Surface(s) Assessment (bed, cushion, seat, etc.) 0 INTERVENTIONS - Wound Cleansing / Measurement  - Simple Wound Cleansing - one wound 0  -  Complex Wound Cleansing - multiple wounds 0  - Wound Imaging (photographs - any number of wounds) 0  - Wound Tracing (instead of photographs) 0  - Simple Wound Measurement - one  wound 0  - Complex Wound Measurement - multiple wounds 0 INTERVENTIONS - Wound Dressings  - Small Wound Dressing one or multiple wounds 0  - Medium Wound Dressing one or multiple wounds 0  - Large Wound Dressing one or multiple wounds 0  - Application of Medications - topical 0  - Application of Medications - injection 0 Rothman, Carnisha ([id number]) INTERVENTIONS - Miscellaneous  - External ear exam 0  - Specimen Collection (cultures, biopsies, blood, body fluids, etc.) 0  - Specimen(s) / Culture(s) sent or taken to Lab for analysis 0  - Patient Transfer (multiple staff / Michiel Sites Lift / Similar devices) 0  - Simple Staple / Suture removal (25 or less) 0  - Complex Staple / Suture removal (26 or more) 0  - Hypo / Hyperglycemic Management (close monitor of Blood Glucose) 0  - Ankle / Brachial Index (ABI) - do not check if billed separately 0 X - Vital Signs 1 5 Has the patient been seen at the hospital within the last three years: Yes Total Score: 30 Level Of Care: New/Established - Level 1 Electronic Signature(s) Signed: 09/05/2015 11:42:18 AM By: Elpidio Eric BSN, RN Entered By: Elpidio Eric on 09/05/2015 11:42:17 PORFIRIA, HEINRICH ([id number]) -------------------------------------------------------------------------------- Encounter Discharge Information Details Patient Name: Alison Fox, Alison Fox 09/05/2015 10:45 Date of Service: AM Medical Record [id number] Number: Patient Account Number: [id number] 1971/06/29 (43 y.o. Treating RN: Clover Mealy, RN, BSN, [place] Sink Date of Birth/Sex: Female) Other Clinician: Primary Care Physician: Erlinda Hong Treating Britto, Errol Referring Physician: Physician/Extender: Tania Ade in Treatment: 1 Encounter Discharge Information Items Discharge Pain  Level: 0 Discharge Condition: Stable Ambulatory Status: Ambulatory Discharge Destination: Home Transportation: Private Auto Accompanied By: mother Schedule Follow-up Appointment: No Medication Reconciliation completed and provided to Patient/Care No Darean Rote: Provided on Clinical Summary of Care: 09/05/2015 Form Type Recipient Paper Patient FM Electronic Signature(s) Signed: 09/05/2015 11:43:43 AM By: Elpidio Eric BSN, RN Previous Signature: 09/05/2015 11:14:01 AM Version By: Gwenlyn Perking Entered By: Elpidio Eric on 09/05/2015 11:43:43 Styron, Sandra ([id number]) -------------------------------------------------------------------------------- General Visit Notes Details Patient Name: Alison Fox, Alison Fox 09/05/2015 10:45 Date of Service: AM Medical Record [id number] Number: Patient Account Number: [id number] 09-02-1971 (43 y.o. Treating RN: Afful, RN, BSN, [place] Sink Date of Birth/Sex: Female) Other Clinician: Primary Care Physician: Erlinda Hong Treating Britto, Errol Referring Physician: Physician/Extender: Tania Ade in Treatment: 1 Notes Patient return today to clinic with still no open areas to the right lower leg. Left leg in lymphedema wrapped being done 3times a week at [facility]. Vascular test ordered. Electronic Signature(s) Signed: 09/05/2015 11:09:09 AM By: Elpidio Eric BSN, RN Entered By: Elpidio Eric on 09/05/2015 11:09:09 ELANA, JIAN ([id number]) -------------------------------------------------------------------------------- Lower Extremity Assessment Details Patient Name: Alison Fox, Alison Fox 09/05/2015 10:45 Date of Service: AM Medical Record [id number] Number: Patient Account Number: [id number] [birth year] (43 y.o. Treating RN: Afful, RN, BSN,  Sink Date of Birth/Sex: Female) Other Clinician: Primary Care Physician: Erlinda Hong Treating Britto, Errol Referring Physician: Physician/Extender: Tania Ade in Treatment: 1 Edema Assessment Assessed:  [Left: No] [Right: No] Edema: [Left: Ye] [Right: s] Vascular Assessment Claudication: Claudication Assessment [Right:None] Pulses: Posterior Tibial Dorsalis Pedis Palpable: [Right:Yes] Extremity colors, hair growth, and conditions: Extremity Color: [Right:Mottled] Hair Growth on Extremity: [Right:No] Temperature of Extremity: [Right:Warm] Capillary Refill: [Right:< 3 seconds] Toe Nail Assessment Left: Right: Thick: Yes Discolored: Yes Deformed: No Improper Length and Hygiene: No Electronic Signature(s) Signed: 09/05/2015 11:07:20 AM By: Elpidio Eric BSN, RN Entered By: Elpidio Eric on 09/05/2015 11:07:20 Sandstrom, Elliet ([id number]) -------------------------------------------------------------------------------- Multi Wound Chart Details Patient Name: Alison Fox, Alison Fox 09/05/2015 10:45  Date of Service: AM Medical Record [id number] Number: Patient Account Number: [id number] [birth year] (43 y.o. Treating RN: Clover MealyAfful, RN, BSN, [place] Sinkita Date of Birth/Sex: Female) Other Clinician: Primary Care Physician: Erlinda HongVINCENT, DUNCAN Treating Britto, Errol Referring Physician: Physician/Extender: Tania AdeWeeks in Treatment: 1 Vital Signs Height(in): 68 Pulse(bpm): 70 Weight(lbs): 155 Blood Pressure 108/44 (mmHg): Body Mass Index(BMI): 24 Temperature(F): 97.7 Respiratory Rate 17 (breaths/min): Wound Assessments Treatment Notes Electronic Signature(s) Signed: 09/05/2015 11:41:23 AM By: Elpidio EricAfful, Rita BSN, RN Entered By: Elpidio EricAfful, Rita on 09/05/2015 11:41:23 Alison Fox, Alison Fox ([id number]) -------------------------------------------------------------------------------- Multi-Disciplinary Care Plan Details Patient Name: Alison Fox, Alison Fox 09/05/2015 10:45 Date of Service: AM Medical Record [id number] Number: Patient Account Number: [id number] [birth year] (43 y.o. Treating RN: Clover MealyAfful, RN, BSN, [place] Sinkita Date of Birth/Sex: Female) Other Clinician: Primary Care Physician: Erlinda HongVINCENT, DUNCAN Treating Britto,  Errol Referring Physician: Physician/Extender: Tania AdeWeeks in Treatment: 1 Active Inactive Electronic Signature(s) Signed: 09/05/2015 11:41:09 AM By: Elpidio EricAfful, Rita BSN, RN Entered By: Elpidio EricAfful, Rita on 09/05/2015 11:41:09 Alison Fox, Alison Fox ([id number]) -------------------------------------------------------------------------------- Pain Assessment Details Patient Name: Alison Fox, Alison Fox 09/05/2015 10:45 Date of Service: AM Medical Record [id number] Number: Patient Account Number: [id number] [birth year] (43 y.o. Treating RN: Clover MealyAfful, RN, BSN, [place] Sinkita Date of Birth/Sex: Female) Other Clinician: Primary Care Physician: Erlinda HongVINCENT, DUNCAN Treating Britto, Errol Referring Physician: Physician/Extender: Tania AdeWeeks in Treatment: 1 Active Problems Location of Pain Severity and Description of Pain Patient Has Paino No Site Locations Pain Management and Medication Current Pain Management: Electronic Signature(s) Signed: 09/05/2015 10:57:49 AM By: Elpidio EricAfful, Rita BSN, RN Entered By: Elpidio EricAfful, Rita on 09/05/2015 10:57:49 Alison Fox, [place] ([id number]) -------------------------------------------------------------------------------- Patient/Caregiver Education Details Patient Name: Alison Fox, Lynnette 09/05/2015 10:45 Date of Service: AM Medical Record [id number] Number: Patient Account Number: [id number] [birth year] (43 y.o. Treating RN: Clover MealyAfful, RN, BSN, [place] Sinkita Date of Birth/Gender: Female) Other Clinician: Primary Care Physician: Erlinda HongVINCENT, DUNCAN Treating Britto, Errol Referring Physician: Physician/Extender: Tania AdeWeeks in Treatment: 1 Education Assessment Education Provided To: Patient Education Topics Provided Wound/Skin Impairment: Methods: Explain/Verbal Responses: State content correctly Electronic Signature(s) Signed: 09/05/2015 11:42:46 AM By: Elpidio EricAfful, Rita BSN, RN Entered By: Elpidio EricAfful, Rita on 09/05/2015 11:42:46 Alison Fox, Jenelle  ([id number]) -------------------------------------------------------------------------------- Vitals Details Patient Name: Alison Fox, Marlowe 09/05/2015 10:45 Date of Service: AM Medical Record [id number] Number: Patient Account Number: [id number] [birth year] (43 y.o. Treating RN: Afful, RN, BSN, [place] Sinkita Date of Birth/Sex: Female) Other Clinician: Primary Care Physician: Erlinda HongVINCENT, DUNCAN Treating Britto, Errol Referring Physician: Physician/Extender: Tania AdeWeeks in Treatment: 1 Vital Signs Time Taken: 10:57 Temperature (F): 97.7 Height (in): 68 Pulse (bpm): 70 Weight (lbs): 155 Respiratory Rate (breaths/min): 17 Body Mass Index (BMI): 23.6 Blood Pressure (mmHg): 108/44 Reference Range: 80 - 120 mg / dl Electronic Signature(s) Signed: 09/05/2015 10:58:58 AM By: Elpidio EricAfful, Rita BSN, RN Entered By: Elpidio EricAfful, Rita on 09/05/2015 10:58:58

## 2015-09-06 NOTE — Addendum Note (Signed)
Addended by: Hollie SalkBOSWELL, Theo Reither L on: 09/06/2015 08:39 AM   Modules accepted: Orders

## 2015-09-09 ENCOUNTER — Ambulatory Visit: Payer: Medicare Other | Admitting: Occupational Therapy

## 2015-09-09 DIAGNOSIS — I890 Lymphedema, not elsewhere classified: Secondary | ICD-10-CM | POA: Diagnosis not present

## 2015-09-09 NOTE — Patient Instructions (Signed)
LE instructions and precautions as established- see initial eval.   

## 2015-09-09 NOTE — Therapy (Signed)
[place] MAIN [facility] SERVICES [address] [place], [place], [address] Phone: [phone]   Fax:  [phone]  Occupational Therapy Treatment  Patient Details  Name: Alison Fox MRN: [id number] Date of Birth: [birth year] No Data Recorded  Encounter Date: 09/09/2015      OT End of Session - 09/09/15 1326    Visit Number 23   Number of Visits 36   Date for OT Re-Evaluation 10/01/15   OT Start Time 1102   OT Stop Time 1202   OT Time Calculation (min) 60 min   Activity Tolerance Patient tolerated treatment well;No increased pain   Behavior During Therapy [facility] for tasks assessed/performed      Past Medical History  Diagnosis Date  . Lymphedema     Chronic following left knee surgery in 2000.  Marland Kitchen Headache     Past Surgical History  Procedure Laterality Date  . Knee arthroscopy  2000    Left knee.     There were no vitals filed for this visit.  Visit Diagnosis:  Lymphedema      Subjective Assessment - 09/09/15 1105    Subjective  Pt presents for OT Rx visit 22 for CDT to BLE w/ LLE compression wraps in place.Pt reporting soreness in R leg again today. She tells me shecalled DME vendor as instructed, but did not leave VM when she got and answering machine. Pt instructed  to call again to request garment ETA. OT provided a written script for phone call /.   Patient is accompained by: Family member   Pertinent History onset in 59s and positive family history for maternal grandmother and cousin suggests primary LE Tarda; arthroscopic knee sx ~ 2000?, recent fall w/ head laceration. 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 Yes   Pain Score --  not rated   Pain Location Leg   Pain Orientation Right  medial   Pain Descriptors / Indicators  Aching;Sore   Pain Onset 1 to 4 weeks ago  2 weeks ago   Pain Frequency Constant                      OT Treatments/Exercises (OP) - 09/09/15 0001    ADLs   ADL Education Given Yes   Manual Therapy   Manual Therapy Edema management;Manual Lymphatic Drainage (MLD);Compression Bandaging   Manual therapy comments Point tenderness to R distal medial leg and anterior R thigh.  No sign of infection. Scbbed over lesian at area of discoloration and pain at    Soft tissue mobilization fibrosis technique at L medial and lateral malleoli   Manual Lymphatic Drainage (MLD) MLD to LLE as est.   Compression Bandaging Loaned ccl 2 Juzo soft knee high with open toe- sz 3, for weekend. Pt understands she is NOT TO SLEEP IN RLE COMPRESSION STOCKING.LLE gradient compression wraps applied circumferentgradient configuration frcotton stockinett; 8 cm x 1 to foot and ankle, 10 cm x 2, then  12 cm x 2- all layered over .04 x 10 cm and 12 cm Rosidol Soft foam from A-G.   Other Manual Therapy skin care BLE                OT Education - 09/09/15 1325    Education provided Yes  Education Details Cont skilled edu for LE ADL training. Emphasis of training today on simple self MLD   Person(s) Educated Patient;Parent(s)   Methods Explanation;Demonstration   Comprehension Verbalized understanding;Returned demonstration;Need further instruction             OT Long Term Goals - 09/02/15 1205    OT LONG TERM GOAL #1   Title Pt able to correctly apply gradient compression wraps to below knee with moderate caregiver assistance for optimal limb volume reduction and improvement in tissue integrity.   Baseline dependent    Time 2   Period Weeks   Status Achieved   OT LONG TERM GOAL #2   Title Pt to achieve 15% limb volume reduction in LLE, and 10% reduction in RLE by DC to limit lymphedema (LE) progression and infection risk.- NEW GOAL 25%   Baseline dependent   Time 12   Status Partially  Met   OT LONG TERM GOAL #3   Title Pt > 75% compliant with all daily LE self-care protocols w/ moderate caregiver assistance, including simple self-manual lymphatic drainage (MLD), skin care, lymphatic pumping therex, and donning/ doffing progression garments.   Baseline dependent   Time 12   Period Weeks   Status Partially Met   OT LONG TERM GOAL #6   Title During Management Phase CDT Pt to sustain limb volume reductions achieved during Intensive Phase CDT within 5% utilizing LE self-care protocols, appropriate compression garments/ devices, and moderate caregiver assistance.   Baseline dependent   Time 6   Status On-going               Plan - 09/09/15 1327    Clinical Impression Statement Pt tolerated loaned, ccl 2, Juzo Soft (2002) open toed  knee high with no difficulty this weekend. Pt will call DME vendor again this week to request ETA on garment fitting using script written with OT . Swelling very well managed bilaterally w/ ongoing improvements to tissue integrity.   Pt will benefit from skilled therapeutic intervention in order to improve on the following deficits (Retired) Difficulty walking;Impaired flexibility;Increased edema;Decreased knowledge of precautions;Decreased skin integrity;Pain;Decreased balance;Decreased knowledge of use of DME;Decreased scar mobility;Decreased mobility   Rehab Potential Good   Clinical Impairments Affecting Rehab Potential decreased balance, difficulty walkingL decreased L knee akle and foot range of motion 2/2 girth and decreased tissue flexibility at joints, falls risk, infection risk, impaired cognition needed for learning LE self cae   OT Frequency 3x / week   OT Duration 12 weeks   OT Treatment/Interventions Self-care/ADL training;DME and/or AE instruction;Manual lymph drainage;Patient/family education;Compression bandaging;Therapeutic exercises;Scar mobilization;Manual Therapy        Problem List Patient Active Problem List    Diagnosis Date Noted  . Right leg pain 09/05/2015  . Episodic tension-type headache, not intractable 07/22/2015  . Hydrocephalus, adult 09/10/2014  . Glaucoma 08/22/2014  . Vision decreased 08/22/2014  . Frontal headache 08/21/2014  . Right leg swelling 02/27/2014  . Chest pain on respiration 02/27/2014  . Postural dizziness 04/26/2013  . Fatigue 04/26/2013  . Abnormality of gait 01/25/2013  . Preventative health care 05/18/2011  . Lymphedema 08/06/2006   Andrey Spearman, MS, OTR/L, [facility] 09/09/2015 1:31 PM  [place] [facility] SERVICES [address] [place], [place], [address] Phone: [phone]   Fax:  [phone]  Name: Niko Jakel MRN: [id number] Date of Birth: [birth year]

## 2015-09-09 NOTE — Progress Notes (Signed)
Internal Medicine Clinic Attending  I saw and evaluated the patient.  I personally confirmed the key portions of the history and exam documented by Dr. Boswell and I reviewed pertinent patient test results.  The assessment, diagnosis, and plan were formulated together and I agree with the documentation in the resident's note. 

## 2015-09-11 ENCOUNTER — Ambulatory Visit: Payer: Medicare Other | Admitting: Occupational Therapy

## 2015-09-11 DIAGNOSIS — I890 Lymphedema, not elsewhere classified: Secondary | ICD-10-CM | POA: Diagnosis not present

## 2015-09-11 NOTE — Patient Instructions (Signed)
LE instructions and precautions as established- see initial eval.   

## 2015-09-11 NOTE — Therapy (Signed)
[place] MAIN [facility] SERVICES [address] [place], [place], [address] Phone: [phone]   Fax:  [phone]  Occupational Therapy Treatment  Patient Details  Name: Alison Fox MRN: [id number] Date of Birth: 10/08/1971 No Data Recorded  Encounter Date: 09/11/2015      OT End of Session - 09/11/15 1233    Visit Number 24   Number of Visits 36   Date for OT Re-Evaluation 10/01/15   OT Start Time 1123   OT Stop Time 1230   OT Time Calculation (min) 67 min   Activity Tolerance Patient tolerated treatment well;No increased pain   Behavior During Therapy [facility] for tasks assessed/performed      Past Medical History  Diagnosis Date  . Lymphedema     Chronic following left knee surgery in 2000.  Marland Kitchen Headache     Past Surgical History  Procedure Laterality Date  . Knee arthroscopy  2000    Left knee.     There were no vitals filed for this visit.  Visit Diagnosis:  Lymphedema      Subjective Assessment - 09/11/15 1234    Subjective  Pt presents for OT Rx visit 24 for CDT to BLE w/ LLE compression wraps and laoned, ccl 2 RLE knee high in place.Pt reporting ongoing soreness LLE toes. Pt reports decreased pain in R medial leg w/ compression stocking. Script for RLE garemtns and devices has not yet arrived. OT sent request to referring MD via EPIC.   Patient is accompained by: Family member   Pertinent History onset in 5s and positive family history for maternal grandmother and cousin suggests primary LE Tarda; arthroscopic knee sx ~ 2000?, recent fall w/ head laceration. 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 No/denies   Pain Onset 1 to 4 weeks ago  2 weeks ago             LYMPHEDEMA/ONCOLOGY QUESTIONNAIRE - 09/11/15 1204    Left  Lower Extremity Lymphedema   Other LLE A-D volume = 4465.57 ml. A-G vol=9477.54 ml.   Other AD limb volume reduction since last measured on 10/21 is increased by 8.61%. Overall AD reduction =17.69%. AG volume is decreased by 0.79% since 10/21. AG overall reduction= 11.77%.  LVD  from A-D= 30.39%, L>R; overall A-G LVD= 20.90%, L>R                 OT Treatments/Exercises (OP) - 09/11/15 0001    ADLs   ADL Education Given Yes   Manual Therapy   Manual Therapy Edema management;Manual Lymphatic Drainage (MLD);Compression Bandaging   Manual therapy comments Point tenderness to R distal medial leg and anterior R thigh.  No sign of infection. Scbbed over lesian at area of discoloration and pain at    Edema Management LLE comparative limb volumetrics   Compression Bandaging Loaned ccl 2 Juzo soft knee high with open toe- sz 3, for weekend. Pt understands she is NOT TO SLEEP IN RLE COMPRESSION STOCKING.LLE gradient compression wraps applied circumferentgradient configuration frcotton stockinett; 8 cm x 1 to foot and ankle, 10 cm x 2, then  12 cm x 2- all layered over .04 x 10 cm and 12 cm Rosidol Soft foam from A-G.   Other Manual Therapy skin care BLE  OT Education - 09/11/15 1237    Education provided Yes   Education Details Continued skilled Pt/caregiver Education  And LE ADL training throughout visit for lymphedema self care, including compression wrapping, compression garment and device wear/care, lymphatic pumping ther ex, simple self-MLD, and skin care. Discussed progress towards goals.   Person(s) Educated Patient   Methods Explanation   Comprehension Verbalized understanding;Need further instruction             OT Long Term Goals - 09/02/15 1205    OT LONG TERM GOAL #1   Title Pt able to correctly apply gradient compression wraps to below knee with moderate caregiver assistance for optimal limb volume reduction and improvement in tissue integrity.    Baseline dependent    Time 2   Period Weeks   Status Achieved   OT LONG TERM GOAL #2   Title Pt to achieve 15% limb volume reduction in LLE, and 10% reduction in RLE by DC to limit lymphedema (LE) progression and infection risk.- NEW GOAL 25%   Baseline dependent   Time 12   Status Partially Met   OT LONG TERM GOAL #3   Title Pt > 75% compliant with all daily LE self-care protocols w/ moderate caregiver assistance, including simple self-manual lymphatic drainage (MLD), skin care, lymphatic pumping therex, and donning/ doffing progression garments.   Baseline dependent   Time 12   Period Weeks   Status Partially Met   OT LONG TERM GOAL #6   Title During Management Phase CDT Pt to sustain limb volume reductions achieved during Intensive Phase CDT within 5% utilizing LE self-care protocols, appropriate compression garments/ devices, and moderate caregiver assistance.   Baseline dependent   Time 6   Status On-going               Plan - 09/11/15 1227    Clinical Impression Statement Pt demonstrates excellent progresss towards all OT goals. Limb volumetrics values reveal Pt has met, exceeded and sustained LLE volume reduction goals. AD limb volume reduction since last measured on 07/21/15 is increased by 8.61%; however, Overall AD reduction =17.69%. AG volume is decreased by 0.79% since 10/21. AG overall reduction= 11.77%.  LVD  from A-D= 30.39%, L>R; overall A-G LVD= 20.90%, L>R. Pt tolerating treatment well. She continues to work on home program compliance.   Pt will benefit from skilled therapeutic intervention in order to improve on the following deficits (Retired) Difficulty walking;Impaired flexibility;Increased edema;Decreased knowledge of precautions;Decreased skin integrity;Pain;Decreased balance;Decreased knowledge of use of DME;Decreased scar mobility;Decreased mobility   Rehab Potential Good   Clinical Impairments Affecting Rehab Potential decreased balance, difficulty walkingL  decreased L knee akle and foot range of motion 2/2 girth and decreased tissue flexibility at joints, falls risk, infection risk, impaired cognition needed for learning LE self cae   OT Frequency 3x / week   OT Duration 12 weeks   OT Treatment/Interventions Self-care/ADL training;DME and/or AE instruction;Manual lymph drainage;Patient/family education;Compression bandaging;Therapeutic exercises;Scar mobilization;Manual Therapy        Problem List Patient Active Problem List   Diagnosis Date Noted  . Right leg pain 09/05/2015  . Episodic tension-type headache, not intractable 07/22/2015  . Hydrocephalus, adult 09/10/2014  . Glaucoma 08/22/2014  . Vision decreased 08/22/2014  . Frontal headache 08/21/2014  . Right leg swelling 02/27/2014  . Chest pain on respiration 02/27/2014  . Postural dizziness 04/26/2013  . Fatigue 04/26/2013  . Abnormality of gait 01/25/2013  . Preventative health care 05/18/2011  . Lymphedema 08/06/2006  Andrey Spearman, MS, OTR/L, [facility] 09/11/2015 12:41 PM  [place] MAIN [facility] SERVICES [address] [place], [place], [address] Phone: [phone]   Fax:  [phone]  Name: Alison Fox MRN: [id number] Date of Birth: Apr 02, 1971

## 2015-09-13 ENCOUNTER — Ambulatory Visit: Payer: Medicare Other | Admitting: Occupational Therapy

## 2015-09-18 ENCOUNTER — Ambulatory Visit: Payer: Medicare Other | Admitting: Occupational Therapy

## 2015-09-18 DIAGNOSIS — I890 Lymphedema, not elsewhere classified: Secondary | ICD-10-CM | POA: Diagnosis not present

## 2015-09-18 NOTE — Patient Instructions (Signed)
LE instructions and precautions as established- see initial eval.   

## 2015-09-18 NOTE — Therapy (Signed)
[place] MAIN [facility] SERVICES [address] [place], [place], [address] Phone: [phone]   Fax:  [phone]  Occupational Therapy Treatment  Patient Details  Name: Alison Fox MRN: [id number] Date of Birth: 1970/12/30 No Data Recorded  Encounter Date: 09/18/2015      OT End of Session - 09/18/15 1632    Visit Number 25   Number of Visits 36   Date for OT Re-Evaluation 10/01/15   OT Start Time 1300   OT Stop Time 1405   OT Time Calculation (min) 65 min   Equipment Utilized During Treatment Pt fitted w/: LLE custom Elvarex A-G, ccl 3, custom ccl2 Elvarex soft A-D for RLE, and LLE custom ccl 2 E soft toe cap. See assessment under "clinical impressions"   Activity Tolerance Patient tolerated treatment well   Behavior During Therapy [facility] for tasks assessed/performed      Past Medical History  Diagnosis Date  . Lymphedema     Chronic following left knee surgery in 2000.  Marland Kitchen Headache     Past Surgical History  Procedure Laterality Date  . Knee arthroscopy  2000    Left knee.     There were no vitals filed for this visit.  Visit Diagnosis:  Lymphedema      Subjective Assessment - 09/18/15 1632    Subjective  Pt presents for OT Rx visit 25 for CDT to BLE w/ LLE compression wraps and laoned, ccl 2 RLE knee high in place.Pt continues to c/o worsening soreness at medial R  leg. Pt has scheduled apt w/ vascular MD this week. Emphasis of visit today on volumetricsm, compression garment fitting, assessment and traing.   Patient is accompained by: Family member   Pertinent History onset in 39s and positive family history for maternal grandmother and cousin suggests primary LE Tarda; arthroscopic knee sx ~ 2000?, recent fall w/ head laceration. 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 Yes  not rated- see subjective   Pain Location Leg   Pain Orientation Right   Pain Descriptors / Indicators Aching;Discomfort;Dull;Grimacing;Penetrating;Throbbing   Pain Type Acute pain   Pain Onset 1 to 4 weeks ago  2 weeks ago   Pain Frequency Intermittent   Aggravating Factors  standing, walking             LYMPHEDEMA/ONCOLOGY QUESTIONNAIRE - 09/18/15 1638    Right Lower Extremity Lymphedema   Other RLE limb volume toes to below knee (A-D)= 3108.36 ml; toes to groin (A-G)=7586.96 ml   Other RLE A-D volume is increased by 3.22% since commencing CDT on 07/03/15. Overall A-G volume is increased by 1.3% since commencing CDT.   Left Lower Extremity Lymphedema   Other LLE A-D volume =4831.06 ml. A-G vol=9979.73 ml.   Other LLE A-D limb volume is increased by 8.2% since last measured on 09/11/15.  Overall A-D reduction measures 10.47% since starting CDT on 07/03/15. A-G volume is increased by 5.29% since 11/23. Overall LLE A-G limb volume reduction measures 7.09% since 07/03/15.. AG overall reduction= 11.77%.  LVD  from A-D= 30.39%, L>R; overall A-G LVD= 20.90%, L>R                 OT Treatments/Exercises (OP) - 09/18/15 0001    ADLs   ADL Education Given  Yes   Manual Therapy   Manual Therapy Edema management;Manual Lymphatic Drainage (MLD);Other (comment)  Fitting and assessment BLE custom compression garments. HOS    Manual therapy comments Point tenderness to R distal medial leg and anterior R thigh.  No sign of infection. Scbbed over lesian at area of discoloration and pain at    Edema Management BLE comparative limb volumetrics                OT Education - 09/18/15 1704    Education provided Yes   Education Details Emphasis of LE ADL training on compression garment wear and care, donning and doffing w/ and w/out assistive devices, and assessment of fit and function.   Person(s) Educated Patient;Parent(s)   Methods  Explanation;Demonstration;Tactile cues;Verbal cues;Handout   Comprehension Verbalized understanding;Returned demonstration;Verbal cues required;Tactile cues required;Need further instruction             OT Long Term Goals - 09/02/15 1205    OT LONG TERM GOAL #1   Title Pt able to correctly apply gradient compression wraps to below knee with moderate caregiver assistance for optimal limb volume reduction and improvement in tissue integrity.   Baseline dependent    Time 2   Period Weeks   Status Achieved   OT LONG TERM GOAL #2   Title Pt to achieve 15% limb volume reduction in LLE, and 10% reduction in RLE by DC to limit lymphedema (LE) progression and infection risk.- NEW GOAL 25%   Baseline dependent   Time 12   Status Partially Met   OT LONG TERM GOAL #3   Title Pt > 75% compliant with all daily LE self-care protocols w/ moderate caregiver assistance, including simple self-manual lymphatic drainage (MLD), skin care, lymphatic pumping therex, and donning/ doffing progression garments.   Baseline dependent   Time 12   Period Weeks   Status Partially Met   OT LONG TERM GOAL #6   Title During Management Phase CDT Pt to sustain limb volume reductions achieved during Intensive Phase CDT within 5% utilizing LE self-care protocols, appropriate compression garments/ devices, and moderate caregiver assistance.   Baseline dependent   Time 6   Status On-going               Plan - 09/18/15 1707    Clinical Impression Statement BLE comparatibe lim volumetrics reveal the following summary of limb volume changes changes since last measures and since commencing CDT: LLE A-D limb volume is increased by 8.2% since last measured on 09/11/15.  Overall A-D reduction measures 10.47% since starting CDT on 07/03/15. A-G volume is increased by 5.29% since 11/23. Overall LLE A-G limb volume reduction measures 7.09% since 07/03/15.. AG overall reduction= 11.77%.  LVD  from A-D= 30.39%, L>R; overall  A-G LVD= 20.90%, L>R.  RLE A-D volume is increased by 3.22% since commencing CDT on 07/03/15. Overall A-G volume is increased by 1.3% since commencing CDT.. Problems identified w/ custom daytime compression garments include 1) toe cap overall too large in all dimensions- NEED REMAKE. RLE knee high- foot portion too long; LLE thigh too long overall and foot portion to long- NEED REMAKE. Vendor notified. Will continue to assess further next visit.   Pt will benefit from skilled therapeutic intervention in order to improve on the following deficits (Retired) Difficulty walking;Impaired flexibility;Increased edema;Decreased knowledge of precautions;Decreased skin integrity;Pain;Decreased balance;Decreased knowledge of use of DME;Decreased scar mobility;Decreased mobility   Clinical Impairments Affecting Rehab Potential decreased balance, difficulty walkingL decreased L knee akle and foot  range of motion 2/2 girth and decreased tissue flexibility at joints, falls risk, infection risk, impaired cognition needed for learning LE self cae   OT Frequency 3x / week   OT Duration 12 weeks   OT Treatment/Interventions Self-care/ADL training;DME and/or AE instruction;Manual lymph drainage;Patient/family education;Compression bandaging;Therapeutic exercises;Scar mobilization;Manual Therapy   OT Home Exercise Plan continue LE ther ex daily as directed  w/ parent cuing   Consulted and Agree with Plan of Care Patient;Family member/caregiver   Family Member Consulted mother        Problem List Patient Active Problem List   Diagnosis Date Noted  . Right leg pain 09/05/2015  . Episodic tension-type headache, not intractable 07/22/2015  . Hydrocephalus, adult 09/10/2014  . Glaucoma 08/22/2014  . Vision decreased 08/22/2014  . Frontal headache 08/21/2014  . Right leg swelling 02/27/2014  . Chest pain on respiration 02/27/2014  . Postural dizziness 04/26/2013  . Fatigue 04/26/2013  . Abnormality of gait 01/25/2013   . Preventative health care 05/18/2011  . Lymphedema 08/06/2006    Andrey Spearman, MS, OTR/L, [facility] 09/18/2015 5:16 PM   [place] MAIN [facility] SERVICES [address] [place], [place], [address] Phone: [phone]   Fax:  [phone]  Name: Alison Fox MRN: [id number] Date of Birth: Jun 24, 1971

## 2015-09-20 ENCOUNTER — Ambulatory Visit: Payer: Medicare Other | Attending: Internal Medicine | Admitting: Occupational Therapy

## 2015-09-20 DIAGNOSIS — I890 Lymphedema, not elsewhere classified: Secondary | ICD-10-CM | POA: Diagnosis not present

## 2015-09-20 NOTE — Patient Instructions (Signed)
LE instructions and precautions as established- see initial eval.   

## 2015-09-23 ENCOUNTER — Encounter: Payer: Self-pay | Admitting: Surgery

## 2015-09-23 ENCOUNTER — Ambulatory Visit (HOSPITAL_COMMUNITY)
Admission: RE | Admit: 2015-09-23 | Discharge: 2015-09-23 | Disposition: A | Discharge status: Home / Self Care | Payer: Medicare Other | Source: Ambulatory Visit | Attending: Vascular Surgery | Admitting: Vascular Surgery

## 2015-09-23 DIAGNOSIS — R609 Edema, unspecified: Secondary | ICD-10-CM | POA: Diagnosis present

## 2015-09-23 DIAGNOSIS — M79661 Pain in right lower leg: Secondary | ICD-10-CM | POA: Insufficient documentation

## 2015-09-23 DIAGNOSIS — L819 Disorder of pigmentation, unspecified: Secondary | ICD-10-CM | POA: Insufficient documentation

## 2015-09-25 ENCOUNTER — Ambulatory Visit: Payer: Medicare Other | Admitting: Occupational Therapy

## 2015-09-25 DIAGNOSIS — I890 Lymphedema, not elsewhere classified: Secondary | ICD-10-CM | POA: Diagnosis not present

## 2015-09-25 NOTE — Patient Instructions (Signed)
LE precautions and instructions as established.  Reiterated importance of wearing RLE knee high daily to limit progression of suspected emerging venous stasis ulcer on R medial leg.

## 2015-09-25 NOTE — Therapy (Signed)
[place] MAIN [facility] SERVICES [address] [place], [place], [address] Phone: [phone]   Fax:  [phone]  Occupational Therapy Treatment  Patient Details  Name: Alison Fox MRN: [id number] Date of Birth: Oct 12, 1971 No Data Recorded  Encounter Date: 09/25/2015      OT End of Session - 09/25/15 1511    Visit Number 27   Number of Visits 36   Date for OT Re-Evaluation 10/01/15   OT Start Time 1407   OT Stop Time 1505   OT Time Calculation (min) 58 min   Activity Tolerance No increased pain;Patient tolerated treatment well   Behavior During Therapy [facility] for tasks assessed/performed      Past Medical History  Diagnosis Date  . Lymphedema     Chronic following left knee surgery in 2000.  Marland Kitchen Headache     Past Surgical History  Procedure Laterality Date  . Knee arthroscopy  2000    Left knee.     There were no vitals filed for this visit.  Visit Diagnosis:  Lymphedema      Subjective Assessment - 09/25/15 1454    Subjective  Pt presents for OT Rx visit 27 for CDT to BLE w/ LLE compression stocking in place. Manufacturer's rep and DME vendor's rep are here to assist w/ remake measurements for custom LLE thigh high stocking and toe cap, and custom RLE knee high.   Patient is accompained by: Family member   Pertinent History onset in 3s and positive family history for maternal grandmother and cousin suggests primary LE Tarda; arthroscopic knee sx ~ 2000?, recent fall w/ head laceration. 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 Yes   Pain Score --  not rated   Pain Location Leg   Pain Orientation Right;Medial   Pain Onset 1 to 4 weeks ago                      OT Treatments/Exercises (OP) - 09/25/15 0001    Manual Therapy   Manual therapy comments Point tenderness to R distal  medial leg and anterior R thigh.  No sign of infection. Scbbed over lesian at area of discoloration and pain at    Edema Management Completed anatomical measurements for custom garment remeaes: custom LLE toe cap and thigh high, and custom RLE knee high.                OT Education - 09/25/15 1511    Education provided No             OT Long Term Goals - 09/02/15 1205    OT LONG TERM GOAL #1   Title Pt able to correctly apply gradient compression wraps to below knee with moderate caregiver assistance for optimal limb volume reduction and improvement in tissue integrity.   Baseline dependent    Time 2   Period Weeks   Status Achieved   OT LONG TERM GOAL #2   Title Pt to achieve 15% limb volume reduction in LLE, and 10% reduction in RLE by DC to limit lymphedema (LE) progression and infection risk.- NEW GOAL 25%   Baseline dependent   Time 12   Status Partially Met   OT LONG TERM GOAL #3   Title Pt > 75% compliant with all daily LE self-care protocols w/ moderate caregiver assistance, including  simple self-manual lymphatic drainage (MLD), skin care, lymphatic pumping therex, and donning/ doffing progression garments.   Baseline dependent   Time 12   Period Weeks   Status Partially Met   OT LONG TERM GOAL #6   Title During Management Phase CDT Pt to sustain limb volume reductions achieved during Intensive Phase CDT within 5% utilizing LE self-care protocols, appropriate compression garments/ devices, and moderate caregiver assistance.   Baseline dependent   Time 6   Status On-going               Plan - 09/25/15 1644    Clinical Impression Statement Completed new measurements for all BLE custom compression garment to improve fit, function and comfort and to remedy both knee high and thigh high garments falling down from top edge. Also removed "comfort knee" and increased ccl from 1 to 2 in LLE toe cap. Pt not wearing custom soft knee high on RLE today stating it's  falling down. Encouraged Pt to uilize all compression garments as instructed for optimal outcome.   Pt will benefit from skilled therapeutic intervention in order to improve on the following deficits (Retired) Difficulty walking;Impaired flexibility;Increased edema;Decreased knowledge of precautions;Decreased skin integrity;Pain;Decreased balance;Decreased knowledge of use of DME;Decreased scar mobility;Decreased mobility   Rehab Potential Good   Clinical Impairments Affecting Rehab Potential decreased balance, difficulty walkingL decreased L knee akle and foot range of motion 2/2 girth and decreased tissue flexibility at joints, falls risk, infection risk, impaired cognition needed for learning LE self cae   OT Frequency 3x / week   OT Duration 12 weeks   OT Treatment/Interventions Self-care/ADL training;DME and/or AE instruction;Manual lymph drainage;Patient/family education;Compression bandaging;Therapeutic exercises;Scar mobilization;Manual Therapy   OT Home Exercise Plan continue LE ther ex daily as directed  w/ parent cuing   Consulted and Agree with Plan of Care Patient;Family member/caregiver   Family Member Consulted mother        Problem List Patient Active Problem List   Diagnosis Date Noted  . Right leg pain 09/05/2015  . Episodic tension-type headache, not intractable 07/22/2015  . Hydrocephalus, adult 09/10/2014  . Glaucoma 08/22/2014  . Vision decreased 08/22/2014  . Frontal headache 08/21/2014  . Right leg swelling 02/27/2014  . Chest pain on respiration 02/27/2014  . Postural dizziness 04/26/2013  . Fatigue 04/26/2013  . Abnormality of gait 01/25/2013  . Preventative health care 05/18/2011  . Lymphedema 08/06/2006    Andrey Spearman, MS, OTR/L, [facility] 09/25/2015 4:49 PM   [place] MAIN [facility] SERVICES [address] [place], [place], [address] Phone: [phone]   Fax:  [phone]  Name: Ciji Boston MRN:  [id number] Date of Birth: 08/05/1971

## 2015-09-26 ENCOUNTER — Encounter: Payer: Self-pay | Admitting: Vascular Surgery

## 2015-09-27 ENCOUNTER — Ambulatory Visit: Payer: Medicare Other | Admitting: Occupational Therapy

## 2015-09-27 DIAGNOSIS — I890 Lymphedema, not elsewhere classified: Secondary | ICD-10-CM

## 2015-09-27 NOTE — Patient Instructions (Signed)
LE instructions and precautions as established- see initial eval.   

## 2015-09-27 NOTE — Therapy (Signed)
[place] MAIN [facility] SERVICES [address] [place], [place], [address] Phone: [phone]   Fax:  [phone]  Occupational Therapy Treatment  Patient Details  Name: Alison Fox MRN: [id number] Date of Birth: 1971-05-25 No Data Recorded  Encounter Date: 09/27/2015      OT End of Session - 09/27/15 1205    Visit Number 28   Number of Visits 36   Date for OT Re-Evaluation 10/01/15   OT Start Time 1100   OT Stop Time 1202   OT Time Calculation (min) 62 min   Activity Tolerance No increased pain;Patient tolerated treatment well   Behavior During Therapy [facility] for tasks assessed/performed      Past Medical History  Diagnosis Date  . Lymphedema     Chronic following left knee surgery in 2000.  Marland Kitchen Headache     Past Surgical History  Procedure Laterality Date  . Knee arthroscopy  2000    Left knee.     There were no vitals filed for this visit.  Visit Diagnosis:  Lymphedema      Subjective Assessment - 09/27/15 1157    Subjective  Pt presents for OT Rx visit 28 for CDT to BLE w/ BLE compression stockings in place. Pt reports that she had venous ultrasound this pasat Monday and returns on Wednesday to see the doctor.   Patient is accompained by: Family member   Pertinent History onset in 37s and positive family history for maternal grandmother and cousin suggests primary LE Tarda; arthroscopic knee sx ~ 2000?, recent fall w/ head laceration. 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 No/denies   Pain Onset 1 to 4 weeks ago                      OT Treatments/Exercises (OP) - 09/27/15 0001    ADLs   ADL Education Given Yes   Manual Therapy   Manual Therapy Edema management;Manual Lymphatic Drainage (MLD)   Manual therapy  comments Point tenderness to R distal medial leg and anterior R thigh.  No sign of infection. Scbbed over lesian at area of discoloration and pain at    Soft tissue mobilization fibrosis technique at L medial and lateral malleoli   Manual Lymphatic Drainage (MLD) MLD to LLE as est.   Compression Bandaging Daytime compression: RLE- ccl 2 Elvarex soft knee high w/ open toe; LLE  - custom Elvarex ccl 3 thigh high w/ ccl 2 Elavrex soft toe cap. HOS devices- BLE Jovi A-D   Other Manual Therapy skin care BLE                OT Education - 09/27/15 1204    Education provided Yes   Education Details Reviewed entire leg sequence ofr simple self MLD, reviewed importance of daily skin care, compression and ther ex. Provided repeat handouts fdor al self care protocols   Person(s) Educated Patient;Parent(s)   Methods Explanation;Demonstration;Tactile cues;Verbal cues;Handout   Comprehension Verbalized understanding;Returned demonstration;Verbal cues required;Tactile cues required;Need further instruction             OT Long Term Goals - 09/02/15 1205    OT LONG TERM GOAL #1   Title Pt able to correctly apply gradient compression wraps to below knee with moderate caregiver  assistance for optimal limb volume reduction and improvement in tissue integrity.   Baseline dependent    Time 2   Period Weeks   Status Achieved   OT LONG TERM GOAL #2   Title Pt to achieve 15% limb volume reduction in LLE, and 10% reduction in RLE by DC to limit lymphedema (LE) progression and infection risk.- NEW GOAL 25%   Baseline dependent   Time 12   Status Partially Met   OT LONG TERM GOAL #3   Title Pt > 75% compliant with all daily LE self-care protocols w/ moderate caregiver assistance, including simple self-manual lymphatic drainage (MLD), skin care, lymphatic pumping therex, and donning/ doffing progression garments.   Baseline dependent   Time 12   Period Weeks   Status Partially Met   OT LONG TERM  GOAL #6   Title During Management Phase CDT Pt to sustain limb volume reductions achieved during Intensive Phase CDT within 5% utilizing LE self-care protocols, appropriate compression garments/ devices, and moderate caregiver assistance.   Baseline dependent   Time 6   Status On-going               Plan - 09/27/15 1208    Clinical Impression Statement RLE is more reddened an warmer to the touch than last visit. Increadsed swelling at mid medial leg at area of tenderness is more sore today by report. Pt awaiting results of venous ultrasound and will see vascular specialist mid next week. LLE is well managed and Pt is utilizing garments as directed.. Skin hydration continues to improve with consistent  self care regime. Pt and parent agree w/ plan to decrease visits to F/U status after remade stockings are fitted and assessed as appropriate.   Pt will benefit from skilled therapeutic intervention in order to improve on the following deficits (Retired) Difficulty walking;Impaired flexibility;Increased edema;Decreased knowledge of precautions;Decreased skin integrity;Pain;Decreased balance;Decreased knowledge of use of DME;Decreased scar mobility;Decreased mobility   Rehab Potential Good   Clinical Impairments Affecting Rehab Potential decreased balance, difficulty walkingL decreased L knee akle and foot range of motion 2/2 girth and decreased tissue flexibility at joints, falls risk, infection risk, impaired cognition needed for learning LE self cae   OT Frequency 3x / week   OT Duration 12 weeks   OT Treatment/Interventions Self-care/ADL training;DME and/or AE instruction;Manual lymph drainage;Patient/family education;Compression bandaging;Therapeutic exercises;Scar mobilization;Manual Therapy   OT Home Exercise Plan continue LE ther ex daily as directed  w/ parent cuing   Consulted and Agree with Plan of Care Patient;Family member/caregiver   Family Member Consulted mother         Problem List Patient Active Problem List   Diagnosis Date Noted  . Right leg pain 09/05/2015  . Episodic tension-type headache, not intractable 07/22/2015  . Hydrocephalus, adult 09/10/2014  . Glaucoma 08/22/2014  . Vision decreased 08/22/2014  . Frontal headache 08/21/2014  . Right leg swelling 02/27/2014  . Chest pain on respiration 02/27/2014  . Postural dizziness 04/26/2013  . Fatigue 04/26/2013  . Abnormality of gait 01/25/2013  . Preventative health care 05/18/2011  . Lymphedema 08/06/2006    Andrey Spearman, MS, OTR/L, [facility] 09/27/2015 12:14 PM  [place] MAIN [facility] SERVICES [address] [place], [place], [address] Phone: [phone]   Fax:  [phone]  Name: Alison Fox MRN: [id number] Date of Birth: 1971-03-14

## 2015-10-02 ENCOUNTER — Encounter: Payer: Self-pay | Admitting: Vascular Surgery

## 2015-10-02 ENCOUNTER — Ambulatory Visit (INDEPENDENT_AMBULATORY_CARE_PROVIDER_SITE_OTHER): Payer: Medicare Other | Admitting: Vascular Surgery

## 2015-10-02 ENCOUNTER — Ambulatory Visit: Payer: Medicare Other | Admitting: Occupational Therapy

## 2015-10-02 VITALS — BP 100/65 | HR 87 | Temp 97.1°F | Resp 14 | Ht 67.0 in | Wt 153.0 lb

## 2015-10-02 DIAGNOSIS — M7989 Other specified soft tissue disorders: Secondary | ICD-10-CM

## 2015-10-02 DIAGNOSIS — I890 Lymphedema, not elsewhere classified: Secondary | ICD-10-CM | POA: Diagnosis not present

## 2015-10-02 NOTE — Patient Instructions (Signed)
In addition to as established instruction, re-itteratted to Pt again that RLE compression knee high is to be worn during waking hours, unless it causes pain.  Reitterated elevation when seated - PRN  Facilitated communication between DME vendor and scheduler on patient's behalf. If pending compression garments/ devices are not available for fitting this Friday, Pt does not need to attend session to conserbve visits. Scheduler will call to confirm.

## 2015-10-02 NOTE — Progress Notes (Signed)
Referred by:  Alison KannerErrol Britto, MD [address] [place], [place] [id number]  Reason for referral: right leg swelling and pain   History of Present Illness  Alison Fox is a 44 y.o. (09-18-1971) female who presents with chief complaint: right leg swelling.  History is limited as patient's ability to speak is limited.  Reportedly this patient has a longstanding history of lymphedema in L leg.  Unable to get good characterization of this patient's pain in right leg or chronology for swelling in either leg.  The patient has had no history of DVT, no history of pregnancy, unknown history of varicose vein, no history of venous stasis ulcers, known history of  Lymphedema and known history of skin changes in lower legs.  There is no family history of venous disorders.  The patient has used knee high compression stockings in the past.   Past Medical History  Diagnosis Date  . Lymphedema     Chronic following left knee surgery in 2000.  Marland Kitchen. Headache   . Varicose veins     Past Surgical History  Procedure Laterality Date  . Knee arthroscopy  2000    Left knee.     Social History   Social History  . Marital Status: Single    Spouse Name: N/A  . Number of Children: N/A  . Years of Education: N/A   Occupational History  . Not on file.   Social History Main Topics  . Smoking status: Never Smoker   . Smokeless tobacco: Never Used  . Alcohol Use: No  . Drug Use: No  . Sexual Activity: No   Other Topics Concern  . Not on file   Social History Narrative    Family History  Problem Relation Age of Onset  . Heart disease Maternal Aunt   . Heart disease Maternal Uncle   . Stroke Paternal Uncle   . Cancer Paternal Uncle     Melanoma  . Diabetes Maternal Grandmother   . Diabetes Paternal Grandfather     Current Outpatient Prescriptions  Medication Sig Dispense Refill  . acetaminophen (TYLENOL) 325 MG tablet Take 650 mg by mouth every 6 (six) hours as needed for  pain.    Marland Kitchen. diclofenac sodium (VOLTAREN) 1 % GEL Apply 1 application topically 4 (four) times daily. 1 Tube 1  . Elastic Bandages & Supports (JOBST KNEE HIGH COMPRESSION SM) MISC Wear supports throughout the day. 2 each 0  . nortriptyline (PAMELOR) 50 MG capsule Take 1 capsule (50 mg total) by mouth at bedtime. 60 capsule 2  . nortriptyline (PAMELOR) 25 MG capsule      No current facility-administered medications for this visit.    No Known Allergies   REVIEW OF SYSTEMS:  (Positives checked otherwise negative)  CARDIOVASCULAR:   [ ]  chest pain,  [ ]  chest pressure,  [ ]  palpitations,  [ ]  shortness of breath when laying flat,  [ ]  shortness of breath with exertion,   [x]  pain in feet when walking,  [ ]  pain in feet when laying flat, [ ]  history of blood clot in veins (DVT),  [ ]  history of phlebitis,  [x]  swelling in legs,  [ ]  varicose veins  PULMONARY:   [ ]  productive cough,  [ ]  asthma,  [ ]  wheezing  NEUROLOGIC:   [x]  weakness in arms or legs,  [x]  numbness in arms or legs,  [ ]  difficulty speaking or slurred speech,  [ ]  temporary loss of vision  in one eye,   dizziness  HEMATOLOGIC:    bleeding problems,   problems with blood clotting too easily  MUSCULOSKEL:    joint pain,  joint swelling  GASTROINTEST:    vomiting blood,   blood in stool     GENITOURINARY:    burning with urination,   blood in urine  PSYCHIATRIC:    history of major depression  INTEGUMENTARY:    rashes,   ulcers  CONSTITUTIONAL:    fever,   chills   Physical Examination  Filed Vitals:   10/02/15 1012  BP: 100/65  Pulse: 87  Temp: 97.1 F (36.2 C)  Resp: 14  Height:  (1.702 m)  Weight: 153 lb (69.4 kg)  SpO2: 100%   Body mass index is 23.96 kg/(m^2).  General: A&O x 3, WDWN  Head: [place]/AT  Ear/Nose/Throat: Hearing grossly intact, nares without erythema or drainage, oropharynx without Erythema/Exudate, Mallampati  score: 3  Eyes: PERRLA, EOMI, asymmetric eyes  Neck: Supple, no nuchal rigidity, no palpable LAD  Pulmonary: Sym exp, good air movt, CTAB, no rales, rhonchi, & wheezing  Cardiac: RRR, Nl S1, S2, no Murmurs, rubs or gallops  Vascular: Vessel Right Left  Radial Palpable Palpable  Brachial Palpable Palpable  Carotid Palpable, without bruit Palpable, without bruit  Aorta Not palpable N/A  Femoral Palpable Palpable  Popliteal Not palpable Not palpable  PT Easily Palpable Not Palpable  DP Easily Palpable Not Palpable   Gastrointestinal: soft, NTND, no G/R, no HSM, no masses, no CVAT B  Musculoskeletal: M/S 5/5 throughout , Extremities without ischemic changes , RLE LDS, minimal RLE edema, LLE edema 1+, appearance c/w lymphedema, negative Karposi Stemmer sign bilaterally  Neurologic: CN 2-12 intact , Pain and light touch intact in extremities , Motor exam as listed above  Psychiatric: Judgment intact, Mood & affect appropriate for pt's clinical situation  Dermatologic: See M/S exam for extremity exam, no rashes otherwise noted  Lymph : No Cervical, Axillary, or Inguinal lymphadenopathy    Non-Invasive Vascular Imaging  RLE Venous Insufficiency Duplex (Date: 10/02/2015):   RLE:   no DVT and SVT,   no GSV reflux,   no SSV reflux,  no deep venous reflux   Medical Decision Making  Alison Fox is a 44 y.o. female who presents with: RLE hyperpigmentation c/w CVI, possible LLE lymphedema, asx pulses, possible PAD   The patient's right leg appears is c/w LDS seen in CVI but the venous reflux is not c/w such.  Might be an early case of such.  The history of development of lymphedema after a knee arthroscopy is unusual, as I doubt the ATS could cause such.  I would check the LLE for venous insufficiency before giving the lymphedema diagnosis.  If the LLE has lymphedema, I would refer her to a Lymphedema clinic for lymphatic massage and higher grade compression.  I would  also get BLE ABI to document the perfusion as I get a marked asx on exam, which might reflect the swelling difference in each leg.  Thank you for allowing [place] to participate in this patient's care.   Leonides Sake, MD [facility] [place] Office: [phone] Pager: [phone]  10/02/2015, 10:37 AM

## 2015-10-02 NOTE — Therapy (Signed)
[place] MAIN [facility] SERVICES [address] [place], [place], [address] Phone: [phone]   Fax:  [phone]  Occupational Therapy Treatment  Patient Details  Name: Alison Fox MRN: [id number] Date of Birth: 19-May-1971 No Data Recorded  Encounter Date: 10/02/2015      OT End of Session - 10/02/15 1522    Visit Number 29   Number of Visits 36   Date for OT Re-Evaluation 10/01/15   OT Start Time 1415   OT Stop Time 1505   OT Time Calculation (min) 50 min   Activity Tolerance No increased pain;Patient tolerated treatment well   Behavior During Therapy [facility] for tasks assessed/performed      Past Medical History  Diagnosis Date  . Lymphedema     Chronic following left knee surgery in 2000.  Marland Kitchen Headache   . Varicose veins     Past Surgical History  Procedure Laterality Date  . Knee arthroscopy  2000    Left knee.     There were no vitals filed for this visit.  Visit Diagnosis:  Lymphedema      Subjective Assessment - 10/02/15 1510    Subjective  Pt presents for OT Rx visit 29 for CDT to BLE w/ LLE compression stockings in place. Pt reports that she saw MD at vascular clinic earlier today and she is very concerned that she may possibly have cancer in the R leg. OT obtained faxed notes from Dr Bridgett Larsson and reviewed with patient to reassure her that  the Dr is seeking additional diagnostic tests to rule out Periferal arterial disease.    Patient is accompained by: Family member   Pertinent History onset in 59s and positive family history for maternal grandmother and cousin suggests primary LE Tarda; arthroscopic knee sx ~ 2000?, recent fall w/ head laceration. 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 Yes   Pain Score --  RLE soreness at medial  aspect lump. Not rated numerically   Pain Onset More than a month ago   Pain Frequency Intermittent                      OT Treatments/Exercises (OP) - 10/02/15 0001    ADLs   ADL Education Given Yes   Manual Therapy   Manual Therapy Edema management;Manual Lymphatic Drainage (MLD)   Manual therapy comments Point tenderness to R distal medial leg and anterior R thigh.  No sign of infection. Scbbed over lesian at area of discoloration and pain at    Soft tissue mobilization fibrosis technique at L medial and lateral malleoli   Manual Lymphatic Drainage (MLD) MLD to RLE as est., avoiding medial sore area and focusing on medial bottleneck   Compression Bandaging Daytime compression: RLE- ccl 2 Elvarex soft knee high w/ open toe; LLE  - custom Elvarex ccl 3 thigh high w/ ccl 2 Elavrex soft toe cap. HOS devices- BLE Jovi A-D   Other Manual Therapy skin care BLE                OT Education - 10/02/15 1520    Education provided Yes   Education Details Reviewed and interpreted vascular MD visit notes and plan of care for additional diagnostics. Reviewed rational and benefits of consistently utilizing  RLE knee high compression garment.   Person(s) Educated Parent(s);Patient   Methods Explanation   Comprehension Verbalized understanding;Need further instruction             OT Long Term Goals - 09/02/15 1205    OT LONG TERM GOAL #1   Title Pt able to correctly apply gradient compression wraps to below knee with moderate caregiver assistance for optimal limb volume reduction and improvement in tissue integrity.   Baseline dependent    Time 2   Period Weeks   Status Achieved   OT LONG TERM GOAL #2   Title Pt to achieve 15% limb volume reduction in LLE, and 10% reduction in RLE by DC to limit lymphedema (LE) progression and infection risk.- NEW GOAL 25%   Baseline dependent   Time 12   Status Partially Met   OT LONG TERM GOAL #3   Title Pt > 75% compliant with  all daily LE self-care protocols w/ moderate caregiver assistance, including simple self-manual lymphatic drainage (MLD), skin care, lymphatic pumping therex, and donning/ doffing progression garments.   Baseline dependent   Time 12   Period Weeks   Status Partially Met   OT LONG TERM GOAL #6   Title During Management Phase CDT Pt to sustain limb volume reductions achieved during Intensive Phase CDT within 5% utilizing LE self-care protocols, appropriate compression garments/ devices, and moderate caregiver assistance.   Baseline dependent   Time 6   Status On-going               Plan - 10/02/15 1523    Clinical Impression Statement Limb volume reductions have essentially plateaued. While awaiting deloivery and fitting of remakes and BLE HOS devices, decrease Rx frequency to 1 x weekly to conserve visit. Pt in agreement w/ POC.   Pt will benefit from skilled therapeutic intervention in order to improve on the following deficits (Retired) Difficulty walking;Impaired flexibility;Increased edema;Decreased knowledge of precautions;Decreased skin integrity;Pain;Decreased balance;Decreased knowledge of use of DME;Decreased scar mobility;Decreased mobility   Rehab Potential Good   Clinical Impairments Affecting Rehab Potential decreased balance, difficulty walkingL decreased L knee akle and foot range of motion 2/2 girth and decreased tissue flexibility at joints, falls risk, infection risk, impaired cognition needed for learning LE self cae   OT Frequency 3x / week   OT Duration 12 weeks   OT Treatment/Interventions Self-care/ADL training;DME and/or AE instruction;Manual lymph drainage;Patient/family education;Compression bandaging;Therapeutic exercises;Scar mobilization;Manual Therapy   OT Home Exercise Plan continue LE ther ex daily as directed  w/ parent cuing   Consulted and Agree with Plan of Care Patient;Family member/caregiver   Family Member Consulted mother        Problem  List Patient Active Problem List   Diagnosis Date Noted  . Right leg pain 09/05/2015  . Episodic tension-type headache, not intractable 07/22/2015  . Hydrocephalus, adult 09/10/2014  . Glaucoma 08/22/2014  . Vision decreased 08/22/2014  . Frontal headache 08/21/2014  . Right leg swelling 02/27/2014  . Chest pain on respiration 02/27/2014  . Postural dizziness 04/26/2013  . Fatigue 04/26/2013  . Abnormality of gait 01/25/2013  . Preventative health care 05/18/2011  . Lymphedema 08/06/2006   Andrey Spearman, MS, OTR/L, [facility] 10/02/2015 3:25 PM   [place] MAIN [facility] SERVICES [address] [place], [place], [address] Phone: [phone]   Fax:  [phone]  Name: Alison Fox MRN: [id number] Date of Birth: [birth year]

## 2015-10-02 NOTE — Addendum Note (Signed)
Addended by: Adria DillELDRIDGE-LEWIS, Shondell Poulson L on: 10/02/2015 12:18 PM   Modules accepted: Orders

## 2015-10-04 ENCOUNTER — Ambulatory Visit: Payer: Medicare Other | Admitting: Occupational Therapy

## 2015-10-04 NOTE — Therapy (Signed)
[place] MAIN [facility] SERVICES [address] [place], [place], [address] Phone: [phone]   Fax:  [phone]  Occupational Therapy Treatment  Patient Details  Name: Alison Fox MRN: [id number] Date of Birth: 1971/06/08 No Data Recorded  Encounter Date: 09/20/2015    Past Medical History  Diagnosis Date  . Lymphedema     Chronic following left knee surgery in 2000.  Marland Kitchen Headache   . Varicose veins     Past Surgical History  Procedure Laterality Date  . Knee arthroscopy  2000    Left knee.     There were no vitals filed for this visit.  Visit Diagnosis:  Lymphedema                                 OT Long Term Goals - 09/02/15 1205    OT LONG TERM GOAL #1   Title Pt able to correctly apply gradient compression wraps to below knee with moderate caregiver assistance for optimal limb volume reduction and improvement in tissue integrity.   Baseline dependent    Time 2   Period Weeks   Status Achieved   OT LONG TERM GOAL #2   Title Pt to achieve 15% limb volume reduction in LLE, and 10% reduction in RLE by DC to limit lymphedema (LE) progression and infection risk.- NEW GOAL 25%   Baseline dependent   Time 12   Status Partially Met   OT LONG TERM GOAL #3   Title Pt > 75% compliant with all daily LE self-care protocols w/ moderate caregiver assistance, including simple self-manual lymphatic drainage (MLD), skin care, lymphatic pumping therex, and donning/ doffing progression garments.   Baseline dependent   Time 12   Period Weeks   Status Partially Met   OT LONG TERM GOAL #6   Title During Management Phase CDT Pt to sustain limb volume reductions achieved during Intensive Phase CDT within 5% utilizing LE self-care protocols, appropriate compression garments/ devices, and moderate caregiver assistance.   Baseline dependent   Time 6   Status On-going               Plan - 10/07/15 1426    Clinical Impression Statement Progress towards goals continues slowly but steadily. Compliance w/ self care continues to be something we work on each session. It continues to improve , but is challenging for this patient to habituate new routines.        Problem List Patient Active Problem List   Diagnosis Date Noted  . Right leg pain 09/05/2015  . Episodic tension-type headache, not intractable 07/22/2015  . Hydrocephalus, adult 09/10/2014  . Glaucoma 08/22/2014  . Vision decreased 08/22/2014  . Frontal headache 08/21/2014  . Right leg swelling 02/27/2014  . Chest pain on respiration 02/27/2014  . Postural dizziness 04/26/2013  . Fatigue 04/26/2013  . Abnormality of gait 01/25/2013  . Preventative health care 05/18/2011  . Lymphedema 08/06/2006    Andrey Spearman, MS, OTR/L, [facility] 10/07/2015 2:30 PM   [place] MAIN [facility] SERVICES [address] [place], [place], [address] Phone: [phone]   Fax:  [phone]  Name: Alison Fox MRN: [id number] Date of Birth: 06-11-1971

## 2015-10-10 ENCOUNTER — Ambulatory Visit: Payer: Medicare Other | Admitting: Occupational Therapy

## 2015-10-17 ENCOUNTER — Other Ambulatory Visit: Payer: Self-pay | Admitting: Vascular Surgery

## 2015-10-17 DIAGNOSIS — I890 Lymphedema, not elsewhere classified: Secondary | ICD-10-CM

## 2015-10-18 ENCOUNTER — Ambulatory Visit (INDEPENDENT_AMBULATORY_CARE_PROVIDER_SITE_OTHER)
Admission: RE | Admit: 2015-10-18 | Discharge: 2015-10-18 | Disposition: A | Discharge status: Home / Self Care | Payer: Medicare Other | Source: Ambulatory Visit | Attending: Vascular Surgery | Admitting: Vascular Surgery

## 2015-10-18 ENCOUNTER — Ambulatory Visit (HOSPITAL_COMMUNITY)
Admission: RE | Admit: 2015-10-18 | Discharge: 2015-10-18 | Disposition: A | Discharge status: Home / Self Care | Payer: Medicare Other | Source: Ambulatory Visit | Attending: Vascular Surgery | Admitting: Vascular Surgery

## 2015-10-18 DIAGNOSIS — I890 Lymphedema, not elsewhere classified: Secondary | ICD-10-CM

## 2015-10-18 DIAGNOSIS — M7989 Other specified soft tissue disorders: Secondary | ICD-10-CM

## 2015-10-25 ENCOUNTER — Encounter: Payer: Self-pay | Admitting: Vascular Surgery

## 2015-11-01 ENCOUNTER — Ambulatory Visit (INDEPENDENT_AMBULATORY_CARE_PROVIDER_SITE_OTHER): Payer: Medicare Other | Admitting: Vascular Surgery

## 2015-11-01 ENCOUNTER — Encounter: Payer: Self-pay | Admitting: Vascular Surgery

## 2015-11-01 VITALS — BP 108/66 | HR 82 | Temp 96.9°F | Ht 67.0 in | Wt 157.3 lb

## 2015-11-01 DIAGNOSIS — I890 Lymphedema, not elsewhere classified: Secondary | ICD-10-CM

## 2015-11-01 NOTE — Progress Notes (Signed)
Established Venous Insufficiency   History of Present Illness  Alison Fox is a 45 y.o. (1970-10-25) female who presents with chief complaint: follow up from studies.  The patient's symptoms have not progressed.  The patient's symptoms are: swelling in both legs.  The patient is ompliant with compression stockings.  Pt notes she has been to lymphedema clinic previously without much response.  The patient's PMH, PSH, SH, and FamHx are unchanged from 10/02/15.  Current Outpatient Prescriptions  Medication Sig Dispense Refill  . acetaminophen (TYLENOL) 325 MG tablet Take 650 mg by mouth every 6 (six) hours as needed for pain.    Marland Kitchen diclofenac sodium (VOLTAREN) 1 % GEL Apply 1 application topically 4 (four) times daily. 1 Tube 1  . Elastic Bandages & Supports (JOBST KNEE HIGH COMPRESSION SM) MISC Wear supports throughout the day. 2 each 0  . nortriptyline (PAMELOR) 25 MG capsule     . nortriptyline (PAMELOR) 50 MG capsule Take 1 capsule (50 mg total) by mouth at bedtime. 60 capsule 2   No current facility-administered medications for this visit.    No Known Allergies  On ROS today: continued swelling both legs, no pain today   Physical Examination  Filed Vitals:   11/01/15 0939  BP: 108/66  Pulse: 82  Temp: 96.9 F (36.1 C)  TempSrc: Oral  Height: 5\' 7"  (1.702 m)  Weight: 157 lb 4.8 oz (71.351 kg)  SpO2: 100%   Body mass index is 24.63 kg/(m^2).  General: A&O x 3, WDWN  Pulmonary: Sym exp, good air movt, CTAB, no rales, rhonchi, & wheezing  Cardiac: RRR, Nl S1, S2, no Murmurs, rubs or gallops  Vascular: Vessel Right Left  Radial Palpable Palpable  Brachial Palpable Palpable  Carotid Palpable, without bruit Palpable, without bruit  Aorta Not palpable N/A  Femoral Palpable Palpable  Popliteal Not palpable Not palpable  PT Not Palpable due to stocking Not Palpable due to stocking  DP Not Palpable due to stocking Not Palpable due to stocking    Gastrointestinal: soft, NTND, no G/R, no HSM, - masses, no CVAT B  Musculoskeletal: M/S 5/5 throughout , stockings on both legs, L calf circumference >x5 R calf  Neurologic: Pain and light touch intact in extremities , Motor exam as listed above  Non-Invasive Vascular Imaging  BLE Venous Insufficiency Duplex (Date: 11/01/2015):   LLE:  no DVT and SVT,   minimal GSV reflux,   no SSV reflux as chronically thrombosed  no deep venous reflux  ABI (Date: 11/01/2015)  R:   ABI: 1.12,   DP: tri,   PT: tri,   TBI: 0.94  L:   ABI: 1.15,   DP: tri,   PT: tri,   TBI: 0.78   Medical Decision Making  Alison Fox is a 45 y.o. female who presents with: possible RLE CVI, LLE lymphedema   Pt has no evidence of PAD on BLE ABI.  Her difficult to feel pulses are likely due to lymphedema skin changes.  The patient's minimal CVI in LLE suggests her LLE swelling is due to lymphedema.  Formal diagnosis with lymphoscintigraphy is not necessary, as the only difference is degree of compression anyway.  Again, I am surprised with the RLE CVI studies, as the right leg appears to have CVI skin changes.  I would continue with compression on the RLE.  Based on the patient's vascular studies and examination, I have offered the patient: referral to lymphedema clinic.  The patient is not interested in such  at this time.  If the patient develops recurrent LLE cellulitis from her cellulitis, might [facility] with Dr. Chapman MossShortell who is an expert in lymphedema.  Thank you for allowing us to participate in this patient's care.   Alison SakeBrian Dominigue Gellner, MD [facility] Office: [phone] Pager: [phone]  11/01/2015, 9:51 AM

## 2015-11-07 ENCOUNTER — Ambulatory Visit: Payer: Medicare Other | Admitting: Internal Medicine

## 2015-11-11 ENCOUNTER — Encounter: Payer: Self-pay | Admitting: Student in an Organized Health Care Education/Training Program

## 2015-11-11 ENCOUNTER — Ambulatory Visit (INDEPENDENT_AMBULATORY_CARE_PROVIDER_SITE_OTHER): Payer: Medicare Other | Admitting: Student in an Organized Health Care Education/Training Program

## 2015-11-11 VITALS — BP 102/57 | HR 99 | Temp 98.2°F | Ht 67.0 in | Wt 158.2 lb

## 2015-11-11 DIAGNOSIS — G919 Hydrocephalus, unspecified: Secondary | ICD-10-CM | POA: Diagnosis not present

## 2015-11-11 DIAGNOSIS — I890 Lymphedema, not elsewhere classified: Secondary | ICD-10-CM | POA: Diagnosis not present

## 2015-11-11 DIAGNOSIS — Z23 Encounter for immunization: Secondary | ICD-10-CM | POA: Diagnosis not present

## 2015-11-11 DIAGNOSIS — Z0000 Encounter for general adult medical examination without abnormal findings: Secondary | ICD-10-CM

## 2015-11-11 MED ORDER — NORTRIPTYLINE HCL 50 MG PO CAPS: 50.0000 mg | ORAL_CAPSULE | Freq: Every day | ORAL | Status: DC

## 2015-11-11 NOTE — Assessment & Plan Note (Signed)
Identified in 2015. Followed by neurosurgery and neurology. Symptoms are mild episodic tension type headache that is stable and occasional lightheadedness. Doing well on nortriptyline  night, refill provided today. Plan is for repeat MRI brain in April 2017.

## 2015-11-11 NOTE — Progress Notes (Signed)
   See Encounters tab for problem-based medical decision making  __________________________________________________________  HPI:  45 year old woman with cognitive delay and hydrocephalus who comes in today for concerns of skin changes on her right leg. She spent following in lymphedema clinic for the last several months. The source of her chronic lymphedema is unclear. She has seen vascular surgery. Currently she is being treated with high-grade compression and is been doing well. She said a physician told her that her right skin might have cancer. She reports that she has had changes in the skin coloration on her right leg for many years, but thinks that there are some new discolorations around the right medial malleolus. No easy bleeding. No other recent skin changes or abnormal moles. No past history of skin cancer.  Reports good compliance with her medications at home. She still has occasional mild tension-type headaches. She takes nortriptyline nightly and denies significant side effects. These headaches are treated to mild obstructive hydrocephalus seen on imaging. She is following with neurosurgery and neurology right now. They did offer her intervention but she preferred to treat conservatively and manage her symptoms. Overall her symptoms are stable to improved. Plan for next MRI brain is 6 months from October, which should be April 2017.   __________________________________________________________  Problem List: Patient Active Problem List   Diagnosis Date Noted  . Episodic tension-type headache, not intractable 07/22/2015  . Hydrocephalus, adult 09/10/2014  . Glaucoma 08/22/2014  . Preventative health care 05/18/2011  . Lymphedema 08/06/2006    Medications: Reconciled today in Epic __________________________________________________________  Physical Exam:  Vital Signs: Filed Vitals:   11/11/15 0817  BP: 102/57  Pulse: 99  Temp: 98.2 F (36.8 C)  TempSrc: Oral  Height:   (1.702 m)  Weight: 158 lb 3.2 oz (71.759 kg)  SpO2: 100%    Gen: Well appearing, NAD ENT: OP clear without erythema or exudate.  Neck: No cervical LAD, No thyromegaly or nodules, No JVD. CV: RRR, no murmurs Pulm: Normal effort, CTA throughout, no wheezing Ext: Warm, right leg with 1+ non-pitting edema, left leg with 2+ non-pitting edema, well perfused Skin: No atypical appearing moles. Right shin with tan-brown pigmentation down to the lateral malleolus consistent with stasis skin changes

## 2015-11-11 NOTE — Assessment & Plan Note (Addendum)
In need of cervical cancer screening. She reports last Pap was a long time ago, unclear year, no records, certainly due. She did tell me she has had intercourse before, "a long time ago", so I recommended future cervical cancer screenings. She would prefer gynecology because last one was very painful, and prefers a female provider. I have placed a referral for gynecology. Lipids were appropriate in 2013, recheck in 2018. BP appropriate, BMI appropriate. Flu shot provided today. No other safety concerns today, doing well living with her mother and working part time.

## 2015-11-11 NOTE — Assessment & Plan Note (Signed)
Lymphedema seems stable, she is doing well with compression stockings and follow up with lymphedema clinic. This is unknown etiology. The right leg has moderate chronic stasis changes with increased pigmentation of the shins and medial malleolus. I doubt these skin changes are skin cancer, seems low risk on my exam. A skin biopsy would be a little higher risk for poor healing and infection because of the lymphedema, so we decided to watch for now. Call back if there are further skin changes, and I will examine the skin in 6 months. If there are changes, we can do a small punch biopsy in the future.

## 2015-11-11 NOTE — Patient Instructions (Signed)
1. Continue your medications as prescribed.  2. Call our office if you have worsening headache or dizziness  3. You are due for your next brain MRI in April 2017. I think your neurologist will order this, call our office if you do not hear from them by April.

## 2015-12-02 ENCOUNTER — Ambulatory Visit: Payer: Medicare Other | Attending: Internal Medicine | Admitting: Occupational Therapy

## 2015-12-02 DIAGNOSIS — I890 Lymphedema, not elsewhere classified: Secondary | ICD-10-CM | POA: Insufficient documentation

## 2015-12-02 NOTE — Patient Instructions (Signed)
LE instructions and precautions as established- see initial eval.   

## 2015-12-02 NOTE — Therapy (Signed)
[place] MAIN [facility] SERVICES [address] [place], [place], [address] Phone: [phone]   Fax:  [phone]  Occupational Therapy Treatment  Patient Details  Name: Alison Fox MRN: [id number] Date of Birth: November 22, 1970 No Data Recorded  Encounter Date: 12/02/2015      OT End of Session - 12/02/15 1647    Visit Number 30   Number of Visits 36   Date for OT Re-Evaluation 10/01/15   OT Start Time 1100   OT Stop Time 1220   OT Time Calculation (min) 80 min   Activity Tolerance No increased pain;Patient tolerated treatment well   Behavior During Therapy [facility] for tasks assessed/performed      Past Medical History  Diagnosis Date  . Lymphedema     Chronic following left knee surgery in 2000.  Marland Kitchen Headache   . Varicose veins     Past Surgical History  Procedure Laterality Date  . Knee arthroscopy  2000    Left knee.     There were no vitals filed for this visit.  Visit Diagnosis:  Lymphedema      Subjective Assessment - 12/02/15 1624    Subjective  Pt presents for OT Rx visit 30 for CDT to BLE w/ LLE compression stockings in place. Pt is here for Managaement Phase CDT follow up. Pt is accompanied by her mother and states she is doing well.   Patient is accompained by: Family member   Pertinent History onset in 62s and positive family history for maternal grandmother and cousin suggests primary LE Tarda; arthroscopic knee sx ~ 2000?, recent fall w/ head laceration. 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 No/denies   Pain Onset More than a month ago             LYMPHEDEMA/ONCOLOGY QUESTIONNAIRE - 12/02/15 1626    Right Lower Extremity Lymphedema   Other RLE limb volume toes to below knee (A-D)= 2883.35 ml; toes to groin (A-G)=7240  ml   Other Comparative limb volumetrics reveal RLE A-D volume is decreased by 10.13% since last measured on 09/17/15, and decreased overall by 7.24% since commencing CDT on 07/03/15. RLE AG volume is decreased by 4.57% since last measured, and  Overall A-G volume is decreased by 3.32 % since e commencing CDT   Left Lower Extremity Lymphedema   Other LLE A-D volume =4244.28 ml. A-G vol=9417.36 ml.   Other LLE A-D limb volume is decreased by 13.55% since last measured on 09/17/15.Marland Kitchen  Overall A-D reduction measures 20.48% since starting CDT on 07/03/15. A-G volume is decreased by 5.64% since 11/29. Overall LLE A-G limb volume reduction measures 12.33% since 07/03/15.  LVD  from A-D= 32.07.%, L>R; overall A-G LVD= 23.12%,%, L>R                 OT Treatments/Exercises (OP) - 12/02/15 0001    ADLs   ADL Education Given Yes   Manual Therapy   Manual Therapy Edema management;Other (comment)  compression garment assessment   Edema Management BLE cp,arative limb volumetrics                OT Education - 12/02/15 1646    Education Details Continued skilled Pt/caregiver Education  And LE ADL training throughout visit for lymphedema self care, including compression  wrapping, compression garment and device wear/care, lymphatic pumping ther ex, simple self-MLD, and skin care. Discussed progress towards goals.   Person(s) Educated Patient;Parent(s)   Methods Explanation;Demonstration   Comprehension Verbalized understanding             OT Long Term Goals - 09/02/15 1205    OT LONG TERM GOAL #1   Title Pt able to correctly apply gradient compression wraps to below knee with moderate caregiver assistance for optimal limb volume reduction and improvement in tissue integrity.   Baseline dependent    Time 2   Period Weeks   Status Achieved   OT LONG TERM GOAL #2   Title Pt to achieve 15% limb volume reduction in LLE, and 10% reduction in RLE by DC to limit lymphedema (LE) progression  and infection risk.- NEW GOAL 25%   Baseline dependent   Time 12   Status Partially Met   OT LONG TERM GOAL #3   Title Pt > 75% compliant with all daily LE self-care protocols w/ moderate caregiver assistance, including simple self-manual lymphatic drainage (MLD), skin care, lymphatic pumping therex, and donning/ doffing progression garments.   Baseline dependent   Time 12   Period Weeks   Status Partially Met   OT LONG TERM GOAL #6   Title During Management Phase CDT Pt to sustain limb volume reductions achieved during Intensive Phase CDT within 5% utilizing LE self-care protocols, appropriate compression garments/ devices, and moderate caregiver assistance.   Baseline dependent   Time 6   Status On-going               Plan - 12/02/15 1633    Clinical Impression Statement Pt demonstrates excellent progress towards management phase CDT goals today. Comparative limb volumetrics reveal RLE A-D volume is decreased by 10.13% since last measured on 09/17/15, and decreased overall by 7.24% since commencing CDT on 07/03/15. RLE AG volume is decreased by 4.57% since last measured, and  Overall A-G volume is decreased by 3.32 % since e commencing CDT.LLE A-D limb volume is decreased by 13.55% since last measured on 09/17/15.Marland Kitchen  Overall A-D reduction measures 20.48% since starting CDT on 07/03/15. A-G volume is decreased by 5.64% since 11/29. Overall LLE A-G limb volume reduction measures 12.33% since 07/03/15.  LVD  from A-D= 32.07.%, L>R; overall A-G LVD= 23.12%,%, L>R. Compression garments are slightly loose and are showing signs of wear. Vendor did not fit second pair of garments as we requested from Medicaid. Runner, broadcasting/film/video requesting now measurements for 2nd set. One second set of stockings are fitted we'll DC OT for CDT.. Pt agrees to work on increasing wear time for toe caps and HOS devices. Continue w/ excellent skin care.        Problem List Patient Active Problem List   Diagnosis Date  Noted  . Episodic tension-type headache, not intractable 07/22/2015  . Hydrocephalus, adult 09/10/2014  . Glaucoma 08/22/2014  . Preventative health care 05/18/2011  . Lymphedema 08/06/2006    Andrey Spearman, MS, OTR/L, [facility] 12/02/2015 4:54 PM  [place] MAIN [facility] SERVICES [address] [place], [place], [address] Phone: [phone]   Fax:  [phone]  Name: Adithi Gammon MRN: [id number] Date of Birth: [birth year]

## 2015-12-23 ENCOUNTER — Ambulatory Visit (INDEPENDENT_AMBULATORY_CARE_PROVIDER_SITE_OTHER): Payer: Medicare Other | Admitting: Internal Medicine

## 2015-12-23 ENCOUNTER — Encounter: Payer: Self-pay | Admitting: Internal Medicine

## 2015-12-23 VITALS — BP 115/64 | HR 96 | Temp 98.0°F | Resp 18 | Ht 66.0 in | Wt 160.2 lb

## 2015-12-23 DIAGNOSIS — L0390 Cellulitis, unspecified: Secondary | ICD-10-CM | POA: Insufficient documentation

## 2015-12-23 DIAGNOSIS — B9689 Other specified bacterial agents as the cause of diseases classified elsewhere: Secondary | ICD-10-CM

## 2015-12-23 DIAGNOSIS — R197 Diarrhea, unspecified: Secondary | ICD-10-CM

## 2015-12-23 DIAGNOSIS — L03115 Cellulitis of right lower limb: Secondary | ICD-10-CM | POA: Diagnosis not present

## 2015-12-23 DIAGNOSIS — I890 Lymphedema, not elsewhere classified: Secondary | ICD-10-CM | POA: Diagnosis not present

## 2015-12-23 MED ORDER — CEPHALEXIN 500 MG PO CAPS: 500.0000 mg | ORAL_CAPSULE | Freq: Four times a day (QID) | ORAL | Status: DC

## 2015-12-23 NOTE — Progress Notes (Signed)
Patient ID: Alison Fox, female   DOB: October 17, 1971, 45 y.o.   MRN: [id number]   Subjective:   Patient ID: Alison Fox female   DOB: October 17, 1971 45 y.o.   MRN: [id number]  HPI: [name] is a 45 y.o. with PMH listed below, presented today with c/o swelling and redness of her right lower extremity also with c/o diarrhea. Pt fell ~4 days ago, fell on her porch, she tripped, and go injured by a piece of wood. The next day, she started having increased pain and redness of her leg. She also noticed some yellowish drainage form the bruise on her leg.She denies any fever, chills, vomiting or nausea. She has intermittent dizziness- only when she stands up suddenly from a sitted position, but she says  This is normal for her.  She also started having loose stools ~ 1 week. She denies recent antibiotic use, she gets all her care in this hospital, no sick contacts,  denies eating out. No recent travel. No abdominal pain, no vomiting. She has 3-4 loose non-bloody bowel movements everyday.   Past Medical History  Diagnosis Date  . Lymphedema     Chronic following left knee surgery in 2000.  Marland Kitchen. Headache   . Varicose veins    Current Outpatient Prescriptions  Medication Sig Dispense Refill  . acetaminophen (TYLENOL) 325 MG tablet Take 650 mg by mouth every 6 (six) hours as needed for pain.    Marland Kitchen. diclofenac sodium (VOLTAREN) 1 % GEL Apply 1 application topically 4 (four) times daily. 1 Tube 1  . Elastic Bandages & Supports (JOBST KNEE HIGH COMPRESSION SM) MISC Wear supports throughout the day. 2 each 0  . nortriptyline (PAMELOR) 50 MG capsule Take 1 capsule (50 mg total) by mouth at bedtime. 60 capsule 5   No current facility-administered medications for this visit.   Review of Systems: CONSTITUTIONAL- No Fever, weightloss, night sweat or change in appetite. SKIN- No Rash, colour changes or itching. HEAD- No Headache or dizziness. EYES- No Vision loss, pain, redness, double or blurred  vision. EARS- No vertigo, hearing loss or ear discharge. Mouth/throat- No Sorethroat, dentures, or bleeding gums. RESPIRATORY- No Cough or SOB. CARDIAC- No Palpitations, DOE, PND or chest pain. GI- No nausea, vomiting, diarrhoea, constipation, abd pain. URINARY- No Frequency, urgency, straining or dysuria. NEUROLOGIC- No Numbness, syncope, seizures or burning. [facility]- Denies depression or anxiety.  Objective:  Physical Exam: Filed Vitals:   12/23/15 0852  BP: 115/64  Pulse: 96  Temp: 98 F (36.7 C)  TempSrc: Oral  Resp: 18  Height: 5\' 6"  (1.676 m)  Weight: 160 lb 3.2 oz (72.666 kg)  SpO2: 100%   GENERAL- alert, co-operative, appears as stated age, not in any distress. HEENT- Atraumatic, PERRL, EOMI, oral mucosa appears moist, CARDIAC- RRR, no murmurs, rubs or gallops. RESP- Moving equal volumes of air, andno wheezes or crackles. ABDOMEN- Soft, nontender,  no palpable masses or organomegaly, bowel sounds present. NEURO- No obvious Cr N abnormality, strenght upper and lower extremities-intact, Gait- Normal.  EXTREMITIES- Wearing thick compression stocking, RLE- redness mid leg extending to ankles, tenderness, bruising anterior chin- about mid leg, with dressing, stained with slightly brownish discharge, she also has some greenish scally discoluration on the medial side of her leg, that she states is not new. SKIN- Warm, dry, No rash or lesion. PSYCH- Normal mood and affect, appropriate thought content and speech.  Assessment & Plan:   The patient's case and plan of care was discussed with attending physician, Dr. Heide SparkNarendra.  Please see problem based charting for assessment and plan.

## 2015-12-23 NOTE — Assessment & Plan Note (Signed)
Possibly viral enteritis as against food poisoning ?etiology. Most likely self limitimg. She doe snot appear dehydrated. No risk factors for C. Diff so far. Notryptylline can cause diarrhea., byut she has been on this medication since 12/2014.   Plan- See in 1-2 weeks, if persistent consider Stool work up with C. Diff, Ova and parasite, Lactoferrin - Cmet today, check electrolytes, Cr.

## 2015-12-23 NOTE — Assessment & Plan Note (Signed)
Due to trauma, with increased swelling, redness, and tenderness, in this patient with lymphedema. She has nio sign of systemic involvement with stable vitals and is not toxic appearing.   Plan- & day course of keflex- 500mg  Q6H. - keep legs elevated.

## 2015-12-23 NOTE — Patient Instructions (Signed)
We will be prescribing an antibiotic for you called Cephalexin or keflex. Take one tablet 4 times a day for 7 days.   For your diarrhea, we will see you again in 1-2 weeks. If you are still having the diarrhea, we will ned to get stool sample on you.  It was nice seeing you today.

## 2015-12-24 LAB — CMP14 + ANION GAP
A/G RATIO: 1.4 (ref 1.1–2.5)
ALK PHOS: 87 IU/L (ref 39–117)
ALT: 14 IU/L (ref 0–32)
AST: 15 IU/L (ref 0–40)
Albumin: 3.6 g/dL (ref 3.5–5.5)
Anion Gap: 14 mmol/L (ref 10.0–18.0)
BILIRUBIN TOTAL: 0.2 mg/dL (ref 0.0–1.2)
BUN / CREAT RATIO: 17 (ref 9–23)
BUN: 11 mg/dL (ref 6–24)
CHLORIDE: 101 mmol/L (ref 96–106)
CO2: 26 mmol/L (ref 18–29)
Calcium: 8.6 mg/dL — ABNORMAL LOW (ref 8.7–10.2)
Creatinine, Ser: 0.64 mg/dL (ref 0.57–1.00)
GFR calc non Af Amer: 110 mL/min/{1.73_m2} (ref >59)
GFR, EST AFRICAN AMERICAN: 126 mL/min/{1.73_m2} (ref >59)
GLUCOSE: 69 mg/dL (ref 65–99)
Globulin, Total: 2.5 g/dL (ref 1.5–4.5)
POTASSIUM: 4.2 mmol/L (ref 3.5–5.2)
Sodium: 141 mmol/L (ref 134–144)
TOTAL PROTEIN: 6.1 g/dL (ref 6.0–8.5)

## 2015-12-26 NOTE — Progress Notes (Signed)
Internal Medicine Clinic Attending  Case discussed with Dr. Mariea ClontsEmokpae soon after the resident saw the patient.  We reviewed the resident's history and exam and pertinent patient test results.  I agree with the assessment, diagnosis, and plan of care documented in the resident's note.  Of note, patient is on a 7 day course of keflex for presumed cellulitis

## 2016-01-02 ENCOUNTER — Encounter: Payer: Self-pay | Admitting: Internal Medicine

## 2016-01-02 ENCOUNTER — Ambulatory Visit (INDEPENDENT_AMBULATORY_CARE_PROVIDER_SITE_OTHER): Payer: Medicare Other | Admitting: Internal Medicine

## 2016-01-02 VITALS — BP 111/53 | HR 80 | Temp 97.5°F | Ht 66.5 in | Wt 164.7 lb

## 2016-01-02 DIAGNOSIS — I890 Lymphedema, not elsewhere classified: Secondary | ICD-10-CM | POA: Diagnosis not present

## 2016-01-02 DIAGNOSIS — Z09 Encounter for follow-up examination after completed treatment for conditions other than malignant neoplasm: Secondary | ICD-10-CM | POA: Diagnosis not present

## 2016-01-02 DIAGNOSIS — Z872 Personal history of diseases of the skin and subcutaneous tissue: Secondary | ICD-10-CM | POA: Diagnosis not present

## 2016-01-02 DIAGNOSIS — L03115 Cellulitis of right lower limb: Secondary | ICD-10-CM

## 2016-01-02 NOTE — Assessment & Plan Note (Addendum)
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  Patient already adherent to compression stockings.  Lipodermatosclerosis difficult to treat once it has begun, though it will eventually burn itself out.  Topical steroids can be considered in the future, but will first attempt management through lymphedema clinic.  Patient reports good results when previously seen in that clinic. - PT referral for lymphedema clinic  Addendum: Patient still established at lymphedema clinic.  Will schedule appointment.

## 2016-01-02 NOTE — Patient Instructions (Signed)
1. STOP taking Cephalexin. 2. Follow up with Lymphedema clinic. 3. Continue compression stockings.  Lymphedema Lymphedema is swelling that is caused by the abnormal collection of lymph under the skin. Lymph is fluid from the tissues in your body that travels in the lymphatic system. This system is part of the immune system and includes lymph nodes and lymph vessels. The lymph vessels collect and carry the excess fluid, fats, proteins, and wastes from the tissues of the body to the bloodstream. This system also works to clean and remove bacteria and waste products from the body. Lymphedema occurs when the lymphatic system is blocked. When the lymph vessels or lymph nodes are blocked or damaged, lymph does not drain properly, causing an abnormal buildup of lymph. This leads to swelling in the arms or legs. Lymphedema cannot be cured by medicines, but various methods can be used to help reduce the swelling. CAUSES There are two types of lymphedema. Primary lymphedema is caused by the absence or abnormality of the lymph vessel at birth. Secondary lymphedema is more common. It occurs when the lymph vessel is damaged or blocked. Common causes of lymph vessel blockage include:  Skin infection, such as cellulitis.  Infection by parasites (filariasis).  Injury.  Cancer.  Radiation therapy.  Formation of scar tissue.  Surgery. SYMPTOMS Symptoms of this condition include:  Swelling of the arm or leg.  A heavy or tight feeling in the arm or leg.  Swelling of the feet, toes, or fingers. Shoes or rings may fit more tightly than before.  Redness of the skin over the affected area.  Limited movement of the affected limb.  Sensitivity to touch or discomfort in the affected limb. DIAGNOSIS This condition may be diagnosed with:  A physical exam.  Medical history.  Imaging tests, such as:  Lymphoscintigraphy. In this test, a low dose of a radioactive substance is injected to trace the flow  of lymph through the lymph vessels.  MRI.  CT scan.  Duplex ultrasound. This test uses sound waves to produce images of the vessels and the blood flow on a screen.  Lymphangiography. In this test, a contrast dye is injected into the lymph vessel to help show blockages. TREATMENT Treatment for this condition may depend on the cause. Treatment may include:  Exercise. Certain exercises can help fluid move out of the affected limb.  Massage. Gentle massage of the affected limb can help move the fluid out of the area.  Compression. Various methods may be used to apply pressure to the affected limb in order to reduce the swelling.  Wearing compression stockings or sleeves on the affected limb.  Bandaging the affected limb.  Using an external pump that is attached to a sleeve that alternates between applying pressure and releasing pressure.  Surgery. This is usually only done for severe cases. For example, surgery may be done if you have trouble moving the limb or if the swelling does not get better with other treatments. If an underlying condition is causing the lymphedema, treatment for that condition is needed. For example, antibiotic medicines may be used to treat an infection. HOME CARE INSTRUCTIONS Activities  Exercise regularly as directed by your health care provider.  Do not sit with your legs crossed.  When possible, keep the affected limb raised (elevated) above the level of your heart.  Avoid carrying things with an arm that is affected by lymphedema.  Remember that the affected area is more likely to become injured or infected.  Take these  steps to help prevent infection:  Keep the affected area clean and dry.  Protect your skin from cuts. For example, you should use gloves while cooking or gardening. Do not walk barefoot. If you shave the affected area, use an Neurosurgeon. General Instructions  Take medicines only as directed by your health care  provider.  Eat a healthy diet that includes a lot of fruits and vegetables.  Do not wear tight clothes, shoes, or jewelry.  Do not use heating pads over the affected area.  Avoid having blood pressure checked on the affected limb.  Keep all follow-up visits as directed by your health care provider. This is important. SEEK MEDICAL CARE IF:  You continue to have swelling in your limb.  You have a fever.  You have a cut that does not heal.  You have redness or pain in the affected area.  You have new swelling in your limb that comes on suddenly.  You develop purplish spots or sores (lesions) on your limb. SEEK IMMEDIATE MEDICAL CARE IF:  You have a skin rash.  You have chills or sweats.  You have shortness of breath.   This information is not intended to replace advice given to you by your health care provider. Make sure you discuss any questions you have with your health care provider.   Document Released: 08/02/2007 Document Revised: 02/19/2015 Document Reviewed: 09/12/2014 Elsevier Interactive Patient Education [employer]

## 2016-01-02 NOTE — Progress Notes (Signed)
Internal Medicine Clinic Attending  Case discussed with Dr. Taylor at the time of the visit.  We reviewed the resident's history and exam and pertinent patient test results.  I agree with the assessment, diagnosis, and plan of care documented in the resident's note. 

## 2016-01-02 NOTE — Addendum Note (Signed)
Addended by: Venia MinksAYLOR, Lynda Capistran A on: 01/02/2016 11:46 AM   Modules accepted: Orders

## 2016-01-02 NOTE — Progress Notes (Signed)
Patient ID: Alison Fox, female   DOB: 16-May-1971, [age] y.o.   MRN: [id number]   Subjective:   Patient ID: Alison Fox female   DOB: 16-May-1971 45 y.o.   MRN: [id number]  HPI: Ms.[name] is a 45 y.o. with PMH as below, here for follow up RLE cellulitis.  Please see Problem-Based charting for the status of the patient's chronic medical issues.     Past Medical History  Diagnosis Date  . Lymphedema     Chronic following left knee surgery in 2000.  Marland Kitchen. Headache   . Varicose veins    Current Outpatient Prescriptions  Medication Sig Dispense Refill  . acetaminophen (TYLENOL) 325 MG tablet Take 650 mg by mouth every 6 (six) hours as needed for pain.    . cephALEXin (KEFLEX) 500 MG capsule Take 1 capsule (500 mg total) by mouth 4 (four) times daily. 28 capsule 0  . diclofenac sodium (VOLTAREN) 1 % GEL Apply 1 application topically 4 (four) times daily. 1 Tube 1  . Elastic Bandages & Supports (JOBST KNEE HIGH COMPRESSION SM) MISC Wear supports throughout the day. 2 each 0  . nortriptyline (PAMELOR) 50 MG capsule Take 1 capsule (50 mg total) by mouth at bedtime. 60 capsule 5   No current facility-administered medications for this visit.   Family History  Problem Relation Age of Onset  . Heart disease Maternal Aunt   . Heart disease Maternal Uncle   . Stroke Paternal Uncle   . Cancer Paternal Uncle     Melanoma  . Diabetes Maternal Grandmother   . Diabetes Paternal Grandfather    Social History   Social History  . Marital Status: Single    Spouse Name: N/A  . Number of Children: N/A  . Years of Education: N/A   Social History Main Topics  . Smoking status: Never Smoker   . Smokeless tobacco: Never Used  . Alcohol Use: No  . Drug Use: No  . Sexual Activity: No   Other Topics Concern  . Not on file   Social History Narrative   Review of Systems: No fevers, chills, CP, or SOB.  Minimal continued diarrhea, but improved from previous. Objective:  Physical  Exam: There were no vitals filed for this visit. Physical Exam  Constitutional: She is oriented to person, place, and time and well-developed, well-nourished, and in no distress. No distress.  HENT:  Head: Normocephalic and atraumatic.  Eyes: EOM are normal. No scleral icterus.  Strabismus  Neck: No tracheal deviation present.  Cardiovascular: Normal rate, regular rhythm, normal heart sounds and intact distal pulses.   Pulmonary/Chest: Effort normal. No stridor. No respiratory distress. She has no wheezes.  Musculoskeletal:  3+ nonpitting edema of LLE with brawny induration to midshin.   No peripheral edema of RLE, 0.5 cm hemorrhagic crust to anterior shin without surrounding erythema or drainage.  Brawny induration to midshin and thickened skin circumferentially to lower calf.  Neurological: She is alert and oriented to person, place, and time.  Skin: Skin is warm and dry. She is not diaphoretic.     Assessment & Plan:   Patient and case were discussed with Dr. Heide SparkNarendra.  Please refer to Problem Based charting for further documentation.

## 2016-01-02 NOTE — Assessment & Plan Note (Addendum)
Patient denies fever, chills, or drainage.  She has not completed her antibiotic course because she would often forget to take it 4 times per day.  On exam, no erythema or pus.  A/P: Cellulitis, resolved.  No evidence of infection at this time. - Stop antibiotics.

## 2016-01-08 DIAGNOSIS — G9389 Other specified disorders of brain: Secondary | ICD-10-CM | POA: Diagnosis not present

## 2016-01-20 ENCOUNTER — Encounter: Payer: Self-pay | Admitting: Neurology

## 2016-01-20 ENCOUNTER — Ambulatory Visit (INDEPENDENT_AMBULATORY_CARE_PROVIDER_SITE_OTHER): Payer: Medicare Other | Admitting: Neurology

## 2016-01-20 VITALS — BP 106/70 | HR 101 | Ht 69.0 in | Wt 162.0 lb

## 2016-01-20 DIAGNOSIS — G919 Hydrocephalus, unspecified: Secondary | ICD-10-CM

## 2016-01-20 DIAGNOSIS — G44219 Episodic tension-type headache, not intractable: Secondary | ICD-10-CM | POA: Diagnosis not present

## 2016-01-20 MED ORDER — NORTRIPTYLINE HCL 75 MG PO CAPS: 75.0000 mg | ORAL_CAPSULE | Freq: Every day | ORAL | Status: DC

## 2016-01-20 NOTE — Patient Instructions (Signed)
1.  Increase nortriptyline to 75mg  at bedtime.  Will prescribe 75mg  capsule. 2.  Tylenol or Advil as needed for headache 3.  Will get notes from the neurosurgeon 4.  Follow up in 6 months but CALL IN 4 WEEKS WITH HEADACHE UPDATE.

## 2016-01-20 NOTE — Progress Notes (Signed)
NEUROLOGY FOLLOW UP OFFICE NOTE  Alison Fox [id number]  HISTORY OF PRESENT ILLNESS: Alison Fox is a 45 year old right-handed woman with glaucoma and lymphedema who follows up for chronic daily headache and hydrocephalus.  She is accompanied by her mother who provides some history.    UPDATE: Intensity:  8-9/10; October 8-9/10 Duration:  one hour; October One hour Frequency:  once a week; October Once a week Abortive therapy:  Advil, Tylenol Preventative therapy:  nortriptyline 50mg   She reportedly followed up with neurosurgery but I do not have those notes.  HISTORY: Headaches started last year.  The headaches are bi-frontal, non-throbbing sharp pain, and 8.5-9/10 intensity.  They last all day and occurs daily.  They are associated with dizziness but not nausea.  She was taking Advil or Tylenol daily.  She denies preceding trauma or precipitating factor.  She has history of occasional mild headache, but nothing like this.  She reportedly has longstanding history of glaucoma since childhood, amblyopia of the left eye.  As per her exam from 08/07/14 by her ophthalmologist, Dr. Ames CoupeLarry Jenkins, fundi are unremarkable.    A CT of the head performed on 09/10/14 showed symmetric bilateral ventriculomegaly, enlargement of the third ventricle, minimal enlargement of the fourth ventricle and poorly visualized cerebral acqueduct.  Thinning of the overlying cortex suggests this is chronic hydrocephalus.  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  On 01/21/15, she saw [facility] Con[facility]undkumar [facility].  She had a repeat MRI of the brain without contrast which demonstrated symmetric ventriculomegaly of the lateral ventricles and third ventricle without significant transependymal flow.  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  Endoscopic third ventriculostomy  was offered but not insisted since her only symptoms are headache, which is now mild.  She did not wish to proceed with this procedure and plan is to repeat MRI of brain one more time in 6 months.  PAST MEDICAL HISTORY: Past Medical History  Diagnosis Date  . Lymphedema     Chronic following left knee surgery in 2000.  Marland Kitchen. Headache   . Varicose veins     MEDICATIONS: Current Outpatient Prescriptions on File Prior to Visit  Medication Sig Dispense Refill  . acetaminophen (TYLENOL) 325 MG tablet Take 650 mg by mouth every 6 (six) hours as needed for pain.    Marland Kitchen. diclofenac sodium (VOLTAREN) 1 % GEL Apply 1 application topically 4 (four) times daily. 1 Tube 1  . Elastic Bandages & Supports (JOBST KNEE HIGH COMPRESSION SM) MISC Wear supports throughout the day. 2 each 0   No current facility-administered medications on file prior to visit.    ALLERGIES: No Known Allergies  FAMILY HISTORY: Family History  Problem Relation Age of Onset  . Heart disease Maternal Aunt   . Heart disease Maternal Uncle   . Stroke Paternal Uncle   . Cancer Paternal Uncle     Melanoma  . Diabetes Maternal Grandmother   . Diabetes Paternal Grandfather     SOCIAL HISTORY: Social History   Social History  . Marital Status: Single    Spouse Name: N/A  . Number of Children: N/A  . Years of Education: N/A   Occupational History  . Not on file.   Social History Main Topics  . Smoking status: Never Smoker   . Smokeless tobacco: Never Used  . Alcohol Use: No  . Drug Use: No  .  Sexual Activity: No   Other Topics Concern  . Not on file   Social History Narrative    REVIEW OF SYSTEMS: Constitutional: No fevers, chills, or sweats, no generalized fatigue, change in appetite Eyes: No visual changes, double vision, eye pain Ear, nose and throat: No hearing loss, ear pain, nasal congestion, sore throat Cardiovascular: No chest pain, palpitations Respiratory:  No shortness of breath at rest or with  exertion, wheezes GastrointestinaI: No nausea, vomiting, diarrhea, abdominal pain, fecal incontinence Genitourinary:  No dysuria, urinary retention or frequency Musculoskeletal:  No neck pain, back pain Integumentary: No rash, pruritus, skin lesions Neurological: as above Psychiatric: No depression, insomnia, anxiety Endocrine: No palpitations, fatigue, diaphoresis, mood swings, change in appetite, change in weight, increased thirst Hematologic/Lymphatic:  No anemia, purpura, petechiae. Allergic/Immunologic: no itchy/runny eyes, nasal congestion, recent allergic reactions, rashes  PHYSICAL EXAM: Filed Vitals:   01/20/16 0741  BP: 106/70  Pulse: 101   General: No acute distress.  Patient appears well-groomed.  normal body habitus. Head:  Normocephalic/atraumatic Eyes:  Fundoscopic exam unremarkable without vessel changes, exudates, hemorrhages or papilledema. Neck: supple, no paraspinal tenderness, full range of motion Heart:  Regular rate and rhythm Lungs:  Clear to auscultation bilaterally Back: No paraspinal tenderness Neurological Exam: alert and oriented to person, place, day of week and year (not date or month). Attention span and concentration intact, recent and remote memory intact, fund of knowledge fair (did not know the new president).  Speech fluent and not dysarthric, language intact.  Left exotropia.  Otherwise, CN II-XII intact. Bulk and tone normal, muscle strength 5/5 throughout.  Sensation to light touch, temperature and vibration intact.  Deep tendon reflexes 2+ throughout, toes downgoing.  Finger to nose and heel to shin testing intact.  Gait normal, Romberg negative.  IMPRESSION: Tension-type headaches, stable Hydrocephalus  PLAN: 1.  She has had marked improvement in headaches.  But we will see if we can get frequency down further.  Increase nortriptyline to  at bedtime.  They are to contact us in 4 weeks with update. 2.  Advil or Tylenol for abortive  therapy 3.  Will get follow up notes from neurosurgery. 4.  Follow up in 6 months.  15 minutes spent face to face with patient, over 50% spent discussing management.  Shon Millet, DO  CC:  Tyson Alias, MD

## 2016-01-23 DIAGNOSIS — Z124 Encounter for screening for malignant neoplasm of cervix: Secondary | ICD-10-CM | POA: Diagnosis not present

## 2016-01-23 DIAGNOSIS — Z1151 Encounter for screening for human papillomavirus (HPV): Secondary | ICD-10-CM | POA: Diagnosis not present

## 2016-01-23 DIAGNOSIS — Z1231 Encounter for screening mammogram for malignant neoplasm of breast: Secondary | ICD-10-CM | POA: Diagnosis not present

## 2016-01-23 DIAGNOSIS — Z01419 Encounter for gynecological examination (general) (routine) without abnormal findings: Secondary | ICD-10-CM | POA: Diagnosis not present

## 2016-02-05 ENCOUNTER — Other Ambulatory Visit: Payer: Self-pay | Admitting: Obstetrics and Gynecology

## 2016-02-05 DIAGNOSIS — R928 Other abnormal and inconclusive findings on diagnostic imaging of breast: Secondary | ICD-10-CM

## 2016-02-20 ENCOUNTER — Ambulatory Visit
Admission: RE | Admit: 2016-02-20 | Discharge: 2016-02-20 | Disposition: A | Discharge status: Home / Self Care | Payer: Medicare Other | Source: Ambulatory Visit | Attending: Obstetrics and Gynecology | Admitting: Obstetrics and Gynecology

## 2016-02-20 DIAGNOSIS — R928 Other abnormal and inconclusive findings on diagnostic imaging of breast: Secondary | ICD-10-CM

## 2016-02-20 DIAGNOSIS — N63 Unspecified lump in breast: Secondary | ICD-10-CM | POA: Diagnosis not present

## 2016-02-25 ENCOUNTER — Telehealth: Payer: Self-pay | Admitting: Neurology

## 2016-02-25 NOTE — Telephone Encounter (Signed)
Pt needs to talk to someone about her headache she woke up and the left side is hurting [phone]

## 2016-02-25 NOTE — Telephone Encounter (Signed)
It may be TMJ.  We can give her a cyclobenzaprine 10mg  every 8 hours as needed for headache/jaw pain.  She may want to see a dentist.

## 2016-02-25 NOTE — Telephone Encounter (Signed)
Called pt. She states it is not a headache. Her head is sore, to the touch. It started last night. It starts at the top of her head and goes down the side of her head to her jaw. She has not taken any medication for the soreness, states she has not had a recent headache. Pain level is about the same as it was last night. Please advise.

## 2016-02-25 NOTE — Telephone Encounter (Signed)
Message left

## 2016-02-26 MED ORDER — CYCLOBENZAPRINE HCL 10 MG PO TABS: 10.0000 mg | ORAL_TABLET | Freq: Three times a day (TID) | ORAL | Status: DC | PRN

## 2016-02-26 NOTE — Telephone Encounter (Signed)
VM left for pt to return call to clinic. Will close note for now.

## 2016-02-26 NOTE — Telephone Encounter (Signed)
Pt called back. Would like to try medication. Will contact dentist if pain persists.

## 2016-02-26 NOTE — Addendum Note (Signed)
Addended by: Sheilah MinsFOX, Ebony Yorio A on: 02/26/2016 09:12 AM   Modules accepted: Orders

## 2016-03-26 ENCOUNTER — Other Ambulatory Visit: Payer: Self-pay

## 2016-03-26 MED ORDER — NORTRIPTYLINE HCL 75 MG PO CAPS: 75.0000 mg | ORAL_CAPSULE | Freq: Every day | ORAL | Status: DC

## 2016-03-26 NOTE — Telephone Encounter (Signed)
Last OV: 01/20/16 Next OV: 07/21/16

## 2016-05-11 ENCOUNTER — Encounter (INDEPENDENT_AMBULATORY_CARE_PROVIDER_SITE_OTHER): Payer: Self-pay

## 2016-05-11 ENCOUNTER — Ambulatory Visit (INDEPENDENT_AMBULATORY_CARE_PROVIDER_SITE_OTHER): Payer: Medicare Other | Admitting: Student in an Organized Health Care Education/Training Program

## 2016-05-11 ENCOUNTER — Encounter: Payer: Self-pay | Admitting: Student in an Organized Health Care Education/Training Program

## 2016-05-11 VITALS — BP 114/60 | HR 78 | Temp 98.2°F | Ht 66.5 in | Wt 159.6 lb

## 2016-05-11 DIAGNOSIS — G44219 Episodic tension-type headache, not intractable: Secondary | ICD-10-CM | POA: Diagnosis not present

## 2016-05-11 DIAGNOSIS — G919 Hydrocephalus, unspecified: Secondary | ICD-10-CM | POA: Diagnosis not present

## 2016-05-11 DIAGNOSIS — S0300XA Dislocation of jaw, unspecified side, initial encounter: Secondary | ICD-10-CM

## 2016-05-11 DIAGNOSIS — M26602 Left temporomandibular joint disorder, unspecified: Secondary | ICD-10-CM | POA: Diagnosis not present

## 2016-05-11 DIAGNOSIS — Z0000 Encounter for general adult medical examination without abnormal findings: Secondary | ICD-10-CM

## 2016-05-11 DIAGNOSIS — I890 Lymphedema, not elsewhere classified: Secondary | ICD-10-CM

## 2016-05-11 NOTE — Assessment & Plan Note (Signed)
Patient with severe obstructive hydrocephalus documented since at least 2015 that is been stable on recurrent brain MRIs. Over the last few months she is having new symptoms of recurrent falls at home, memory dysfunction, and her mother notes behavioral changes. I wonder if these changes are related to increasing symptom burden from the hydrocephalus. She follows with Dr. Conchita Paris at [facility] and has been  discussed endoscopic third ventriculostomy before, but deferred intervention because her symptom burden was relatively mild. if this does represent increasing symptoms, I counseled her that it might be more beneficial to pursue invasive procedure now with a goal of preventing falls, improving headache control, and stabilizing memory and behavior changes. They will follow up with Dr. Conchita Paris and I have placed a new referral.

## 2016-05-11 NOTE — Assessment & Plan Note (Signed)
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 She has some evidence of poor dentition, does not follow up regularly with a dentist. Started on cyclobenzaprine by Dr. Everlena Cooper with [facility] neurology. She had some benefit with this and NSAIDs. I advised against further cyclobenzaprine because of her recent falls and memory problems at home. I advised her to follow up with dentistry for routine care.

## 2016-05-11 NOTE — Assessment & Plan Note (Signed)
Headaches overall are well controlled on current dose of nortriptyline 75 mg once nightly. Unclear how much this medicine is contributing to side effects like more frequent falling and memory dysfunction. We are going to continue this medication for now and follow her headache trend closely.

## 2016-05-11 NOTE — Progress Notes (Signed)
Assessment and Plan:  Hydrocephalus, adult Patient with severe obstructive hydrocephalus documented since at least 2015 that is been stable on recurrent brain MRIs. Over the last few months she is having new symptoms of recurrent falls at home, memory dysfunction, and her mother notes behavioral changes. I wonder if these changes are related to increasing symptom burden from the hydrocephalus. She follows with Dr. Conchita Fox at [facility] and has been  discussed endoscopic third ventriculostomy before, but deferred intervention because her symptom burden was relatively mild. if this does represent increasing symptoms, I counseled her that it might be more beneficial to pursue invasive procedure now with a goal of preventing falls, improving headache control, and stabilizing memory and behavior changes. They will follow up with Dr. Conchita Fox and I have placed a new referral.  Lymphedema Very chronic lymphedema with left much worse than right nonpitting edema. Last course of occupational therapy was in 2016. Has seen vascular surgery with no clear procedural options. She is doing fairly well with compression stockings bilaterally, however the lymphedema is getting worse. I placed a new referral for another round of occupational therapy for lymphedema treatment. They can help [place] determine if she needs a referral back to the lymphedema clinic and potentially more advanced therapies like a pneumatic compression device.  Preventative health care Saw a NP at [facility] in April 2017 for PAP smear. I do not have results of cytology or HPV yet from their office. Looks like they also decided to start screening mammography early, which resulted as BI-RADS 3 and needs 6 month follow-up.  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 She has some evidence of poor dentition, does not follow up regularly with a  dentist. Started on cyclobenzaprine by Dr. Everlena Fox with [facility] neurology. She had some benefit with this and NSAIDs. I advised against further cyclobenzaprine because of her recent falls and memory problems at home. I advised her to follow up with dentistry for routine care.  Episodic tension-type headache, not intractable Headaches overall are well controlled on current dose of nortriptyline 75 mg once nightly. Unclear how much this medicine is contributing to side effects like more frequent falling and memory dysfunction. We are going to continue this medication for now and follow her headache trend closely.   __________________________________________________________  HPI:  45 year old woman here for follow-up of lymphedema. She has had some amount of swelling in her legs for over 15 years now. Reportedly this started after a arthroscopy on the left knee around 2000. She uses a compression stocking to both legs every day and reports that the edema is getting a little bit worse over the last few weeks. She had a good experience going to occupational therapy at [facility] and said that they had a lot of good treatment options for her lymphedema. She thinks it that's when she had the best control over the lymphedema. Denies any pain or fever or other changes in her skin. No skin breakdown or wounds. She reports some difficulty with ambulation. 2 weeks ago while walking out of church she fell to the ground. She reports having difficulty with her balance lately. Mother accompanies her and also reports that she has difficulty with memory, as an example she cited that Alison Fox will ask several repetitive questions. She also reports that she's having changes in her behavior, becoming more difficult to manage at home. Alison Fox reports that her headaches are under good control, she does still  describe a soreness over her left temple.    _______________________________________________________  Problem List: Patient Active Problem List   Diagnosis Date Noted  . Hydrocephalus, adult 09/10/2014    Priority: High  . Lymphedema 08/06/2006    Priority: High  . Episodic tension-type headache, not intractable 07/22/2015    Priority: Low  . Glaucoma 08/22/2014    Priority: Low  . Preventative health care 05/18/2011    Priority: Low  . TMJ (dislocation of temporomandibular joint) 05/11/2016    Medications: Reconciled today in Epic __________________________________________________________  Physical Exam:  Vital Signs: Vitals:   05/11/16 0956  BP: 114/60  Pulse: 78  Temp: 98.2 F (36.8 C)  TempSrc: Oral  SpO2: 100%  Weight: 159 lb 9.6 oz (72.4 kg)  Height: 5' 6.5" (1.689 m)    Gen: Well appearing, NAD ENT: OP clear without erythema or exudate. There is tenderness to palpation over the left TMJ. Neck: No cervical LAD, No thyromegaly or nodules, No JVD. CV: RRR, no murmurs Pulm: Normal effort, CTA throughout, no wheezing Ext: Left leg has 3+ nonpitting edema with chronic stasis changes, right leg with 1+ nonpitting edema and chronic stasis changes.

## 2016-05-11 NOTE — Patient Instructions (Addendum)
1. I am worried that your increased falling, behavior changes, memory changes, and headaches are coming from the pressure inside your brain (hydrocephalus). Please call Dr. Edward Jolly office at [facility] to set up a follow up appointment about these new changes [phone]). You might be at the point where a procedure to remove that pressure will be helpful.   2. I have referred you back to Occupational Therapy at [facility] for another round of lymphedema treatment.

## 2016-05-11 NOTE — Assessment & Plan Note (Signed)
Saw a NP at [facility] in April 2017 for PAP smear. I do not have results of cytology or HPV yet from their office. Looks like they also decided to start screening mammography early, which resulted as BI-RADS 3 and needs 6 month follow-up.

## 2016-05-11 NOTE — Assessment & Plan Note (Signed)
Very chronic lymphedema with left much worse than right nonpitting edema. Last course of occupational therapy was in 2016. Has seen vascular surgery with no clear procedural options. She is doing fairly well with compression stockings bilaterally, however the lymphedema is getting worse. I placed a new referral for another round of occupational therapy for lymphedema treatment. They can help [place] determine if she needs a referral back to the lymphedema clinic and potentially more advanced therapies like a pneumatic compression device.

## 2016-05-14 ENCOUNTER — Encounter: Payer: Self-pay | Admitting: Student in an Organized Health Care Education/Training Program

## 2016-05-14 DIAGNOSIS — G9389 Other specified disorders of brain: Secondary | ICD-10-CM | POA: Diagnosis not present

## 2016-05-14 DIAGNOSIS — G939 Disorder of brain, unspecified: Secondary | ICD-10-CM | POA: Diagnosis not present

## 2016-05-21 DIAGNOSIS — G939 Disorder of brain, unspecified: Secondary | ICD-10-CM | POA: Diagnosis not present

## 2016-05-21 DIAGNOSIS — G911 Obstructive hydrocephalus: Secondary | ICD-10-CM | POA: Diagnosis not present

## 2016-07-17 ENCOUNTER — Other Ambulatory Visit: Payer: Self-pay | Admitting: Obstetrics and Gynecology

## 2016-07-17 DIAGNOSIS — N63 Unspecified lump in unspecified breast: Secondary | ICD-10-CM

## 2016-07-21 ENCOUNTER — Encounter: Payer: Self-pay | Admitting: Neurology

## 2016-07-21 ENCOUNTER — Ambulatory Visit (INDEPENDENT_AMBULATORY_CARE_PROVIDER_SITE_OTHER): Payer: Medicare Other | Admitting: Neurology

## 2016-07-21 VITALS — BP 116/78 | HR 78 | Ht 67.0 in | Wt 165.0 lb

## 2016-07-21 DIAGNOSIS — G44219 Episodic tension-type headache, not intractable: Secondary | ICD-10-CM | POA: Diagnosis not present

## 2016-07-21 DIAGNOSIS — M26629 Arthralgia of temporomandibular joint, unspecified side: Secondary | ICD-10-CM | POA: Diagnosis not present

## 2016-07-21 DIAGNOSIS — G919 Hydrocephalus, unspecified: Secondary | ICD-10-CM | POA: Diagnosis not present

## 2016-07-21 MED ORDER — TIZANIDINE HCL 2 MG PO TABS: 2.0000 mg | ORAL_TABLET | Freq: Three times a day (TID) | ORAL | 0 refills | Status: DC | PRN

## 2016-07-21 MED ORDER — NORTRIPTYLINE HCL 50 MG PO CAPS: 100.0000 mg | ORAL_CAPSULE | Freq: Every day | ORAL | 5 refills | Status: DC

## 2016-07-21 NOTE — Progress Notes (Signed)
NEUROLOGY FOLLOW UP OFFICE NOTE  Alison SeashoreFrances Durley [id number]  HISTORY OF PRESENT ILLNESS: Alison Fox is a 45 year old right-handed woman with glaucoma and lymphedema who follows up for tension type headache and hydrocephalus.  She is accompanied by her mother who provides some history.     UPDATE: Intensity:  8-9/10; April: 8-9/10 Duration:  one hour; April: One hour Frequency:  once to twice a week; April: Once a week Abortive therapy:  Advil, Tylenol Preventative therapy:  nortriptyline 50mg  She also developed a different headache, in which she experiences a pain at the top of her head which radiates down the left side to her jaw.  She was prescribed cyclobenzaprine and advised to see a dentist for possible TMJ dysfunction.  Cyclobenzaprine was discontinued by her PCP because it was causing falls and memory issues.  She reports soreness in the left temple.  She has not yet followed up with the dentist.   HISTORY: Headaches started a couple of years ago.  The headaches are bi-frontal, non-throbbing sharp pain, and 8.5-9/10 intensity.  They last all day and occurs daily.  They are associated with dizziness but not nausea.  She was taking Advil or Tylenol daily.  She denies preceding trauma or precipitating factor.  She has history of occasional mild headache, but nothing like this.  She reportedly has longstanding history of glaucoma since childhood, amblyopia of the left eye.  As per her exam from 08/07/14 by her ophthalmologist, Dr. Ames CoupeLarry Jenkins, fundi are unremarkable.     A CT of the head performed on 09/10/14 showed symmetric bilateral ventriculomegaly, enlargement of the third ventricle, minimal enlargement of the fourth ventricle and poorly visualized cerebral acqueduct.  Thinning of the overlying cortex suggests this is chronic hydrocephalus.   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  On 01/21/15, she saw [facility].  She had a repeat MRI of the brain without contrast which demonstrated symmetric ventriculomegaly of the lateral ventricles and third ventricle without significant transependymal flow.  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  Endoscopic third ventriculostomy was offered but not insisted since her only symptoms are headache, which is now mild.  She did not wish to proceed with this procedure.    PAST MEDICAL HISTORY: Past Medical History:  Diagnosis Date  . Glaucoma 08/22/2014  . Headache   . 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 On 01/21/15, she [facility].  She had a repeat MRI of the brain without contrast which demonstrated symmetric ventriculomegaly of the lateral ventricles and third ventricle without significant transependymal flow.  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  Endoscopic third ventriculostomy was offered but not insisted since her only symptoms are headache, which is now mild.  She did not wish to proceed with this procedure.  . Lymphedema    Chronic following left knee surgery in 2000.  . Varicose veins     MEDICATIONS: Current Outpatient Prescriptions on File Prior to Visit  Medication Sig Dispense Refill  . Elastic Bandages & Supports (JOBST KNEE HIGH COMPRESSION SM) MISC Wear supports throughout the day. 2 each 0   No current facility-administered medications on file prior to visit.     ALLERGIES: No Known Allergies  FAMILY HISTORY: Family History  Problem Relation Age of  Onset  . Heart disease Maternal Aunt   . Heart disease Maternal Uncle   . Stroke Paternal Uncle   . Cancer Paternal Uncle     Melanoma  . Diabetes Maternal Grandmother   . Diabetes Paternal Grandfather     SOCIAL  HISTORY: Social History   Social History  . Marital status: Single    Spouse name: N/A  . Number of children: N/A  . Years of education: N/A   Occupational History  . Not on file.   Social History Main Topics  . Smoking status: Never Smoker  . Smokeless tobacco: Never Used  . Alcohol use No  . Drug use: No  . Sexual activity: No   Other Topics Concern  . Not on file   Social History Narrative  . No narrative on file    REVIEW OF SYSTEMS: Constitutional: No fevers, chills, or sweats, no generalized fatigue, change in appetite Eyes: No visual changes, double vision, eye pain Ear, nose and throat: No hearing loss, ear pain, nasal congestion, sore throat Cardiovascular: No chest pain, palpitations Respiratory:  No shortness of breath at rest or with exertion, wheezes GastrointestinaI: No nausea, vomiting, diarrhea, abdominal pain, fecal incontinence Genitourinary:  No dysuria, urinary retention or frequency Musculoskeletal:  No neck pain, back pain Integumentary: No rash, pruritus, skin lesions Neurological: as above Psychiatric: No depression, insomnia, anxiety Endocrine: No palpitations, fatigue, diaphoresis, mood swings, change in appetite, change in weight, increased thirst Hematologic/Lymphatic:  No purpura, petechiae. Allergic/Immunologic: no itchy/runny eyes, nasal congestion, recent allergic reactions, rashes  PHYSICAL EXAM: Vitals:   07/21/16 0746  BP: 116/78  Pulse: 78   General: No acute distress.  Patient appears well-groomed.   Head:  Normocephalic/atraumatic.  Tenderness to palpation of left temple and left TMJ. Eyes:  Fundi examined but not visualized Neck: supple, no paraspinal tenderness, full range of motion Heart:  Regular rate and rhythm Lungs:  Clear to auscultation bilaterally Back: No paraspinal tenderness Neurological Exam: alert and oriented to person, place, day of week and year (not date or month). Attention span and concentration intact,  recent and remote memory intact, fund of knowledge fair (did not know the new president).  Speech fluent and not dysarthric, language intact.  Left exotropia.  Otherwise, CN II-XII intact. Bulk and tone normal, muscle strength 5/5 throughout.  Sensation to light touch  intact.  Deep tendon reflexes 2+ throughout.  Finger to nose and heel to shin testing intact.  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  Increase nortriptyline to 100mg  at bedtime 2.  For acute head soreness, will prescribe tizanidine 2mg  every 8 hours as needed.  Advised to caution for drowsiness and to discontinue if she has falls or problems with memory 3.  Advised to follow up with the dentist, as she may have TMJ dysfunction, which could be contributing to the headache. 4.  Follow up in 6 months.  21 minutes spent face to face with patient, over 50% spent counseling.  Shon Millet, DO  CC:  Erlinda Hong, MD

## 2016-07-21 NOTE — Patient Instructions (Addendum)
1.  We will increase nortriptyline to 100mg  at bedtime 2.  For acute head soreness, we will try a different muscle relaxer.  Tizanidine 2mg .  May take every 8 hours as needed for head soreness.  Caution for drowsiness.  If you experience drowsiness to the point that you are falling or have memory problems, then stop. 3.  PLEASE SEE YOUR DENTIST TO EVALUATE FOR TEMPOROMANDIBULAR JOINT DYSFUNCTION.  I think jaw pain is contributing to the headache. 4.  Follow up in 6 months.

## 2016-08-17 ENCOUNTER — Encounter: Payer: Medicare Other | Admitting: Student in an Organized Health Care Education/Training Program

## 2016-08-17 ENCOUNTER — Telehealth: Payer: Self-pay | Admitting: Student in an Organized Health Care Education/Training Program

## 2016-08-17 DIAGNOSIS — I890 Lymphedema, not elsewhere classified: Secondary | ICD-10-CM

## 2016-08-17 NOTE — Telephone Encounter (Signed)
Reordered lymphedema therapy per OT request

## 2016-08-24 ENCOUNTER — Ambulatory Visit (INDEPENDENT_AMBULATORY_CARE_PROVIDER_SITE_OTHER): Payer: Medicare Other | Admitting: Student in an Organized Health Care Education/Training Program

## 2016-08-24 ENCOUNTER — Encounter: Payer: Self-pay | Admitting: Student in an Organized Health Care Education/Training Program

## 2016-08-24 DIAGNOSIS — G44219 Episodic tension-type headache, not intractable: Secondary | ICD-10-CM

## 2016-08-24 DIAGNOSIS — Z23 Encounter for immunization: Secondary | ICD-10-CM

## 2016-08-24 DIAGNOSIS — R6520 Severe sepsis without septic shock: Secondary | ICD-10-CM

## 2016-08-24 DIAGNOSIS — G919 Hydrocephalus, unspecified: Secondary | ICD-10-CM

## 2016-08-24 DIAGNOSIS — Q030 Malformations of aqueduct of Sylvius: Secondary | ICD-10-CM | POA: Diagnosis not present

## 2016-08-24 DIAGNOSIS — I890 Lymphedema, not elsewhere classified: Secondary | ICD-10-CM | POA: Diagnosis not present

## 2016-08-24 MED ORDER — NORTRIPTYLINE HCL 50 MG PO CAPS: 50.0000 mg | ORAL_CAPSULE | Freq: Every day | ORAL | 5 refills | Status: DC

## 2016-08-24 NOTE — Patient Instructions (Addendum)
1. For the redness of your left leg, please keep it clean and dry. You can use some dry gauze to wrap it. Keep the lymphedema dessing off the right leg for most of the day until the wound heals.   2. Keep taking the nortryptiline as prescribed.

## 2016-08-24 NOTE — Progress Notes (Signed)
Assessment and Plan:  See Encounters tab for problem-based medical decision making.   __________________________________________________________  HPI:  45 year old woman here for follow-up of chronic episodic headaches. 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 She been evaluated by [facility] and decided to conservatively monitor the hydrocephalus given that she was symptomatically stable. She struggles with episodic tension-type headaches in the day and the evening. She uses nortriptyline at night with good benefit. Her neurologist tried to increase the nortriptyline dose to 100 mg but she reported feeling very sleepy and drowsy after taking the higher dose. She reduced her dose back down to 50 mg and has been doing very well on that dose for several months. Denies any recent falls. Denies any confusion. Eating and drinking well. Likes to walk outside of the house every day. Lives with her mother, independent in her activities of daily living.  __________________________________________________________  Problem List: Patient Active Problem List   Diagnosis Date Noted  . Hydrocephalus, adult 09/10/2014    Priority: High  . Lymphedema 08/06/2006    Priority: High  . Episodic tension-type headache, not intractable 07/22/2015    Priority: Medium  . Glaucoma 08/22/2014    Priority: Low  . Preventative health care 05/18/2011    Priority: Low    Medications: Reconciled today in Epic __________________________________________________________  Physical Exam:  Vital Signs: Vitals:   08/24/16 0936  BP: 114/65  Pulse: 92  Temp: 97.6 F (36.4 C)  TempSrc: Oral  SpO2: 100%  Weight: 164 lb 6.4 oz (74.6 kg)  Height: 5\' 7"  (1.702 m)    Gen: Well appearing, NAD Neck: No cervical LAD, Or thyromegaly CV: RRR, no murmurs Pulm: Normal effort, CTA throughout, no wheezing Ext: Warm, severe  lymphedema of the left leg, mild lymphedema of the right with stasis changes, nonpitting, compression stockings in place Skin: Her back has a few moles, one with irregular pigmentation but this is stable in appearance for many years. Chronic hemosiderin deposits on both lower legs, The right leg has a 1 x 1 cm stage I ulceration with surrounding erythema that is the same shape as the adhesive bandage that was overlying it.

## 2016-08-24 NOTE — Assessment & Plan Note (Signed)
Headaches are much improved since I last saw her. Neurologist attempted to increase nortriptyline to 100 mg daily at bedtime but she was unable to tolerate the higher dose because of somnolence. Currently she is only taking 50 mg daily at bedtime which I think is okay to continue. She did not start the tizanidine that was prescribed which I think is fine because she is symptomatically doing so well.

## 2016-08-24 NOTE — Assessment & Plan Note (Signed)
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 At her last visit I was worried about her increasing headache and falls. With medication adjustments she seems to be doing better, headaches are now much improved. She follows with Dr. Everlena CooperJaffe with neurology and currently has no plans for repeat brain imaging. We will continue to monitor and I advised return to clinic with any increase in headache so we can repeat brain imaging.

## 2016-08-24 NOTE — Assessment & Plan Note (Signed)
Chronic lymphedema is stable with left still much worse than right. She uses prescription compression hoses for 12 hours each day. Arranged for her to have another round of occupational therapy for lymphedema treatment in [place] which will start on Friday. She does have a new wound on her right leg which appears to be stage I pressure wound likely due to the very thin skin from the chronic stasis changes as well as from the constant compression. Advised her to keep this wrapped with gauze, use the compression hose less on the right leg until this wound heals. I advised avoiding adhesive bandages for now. Call me if the wound gets any worse.

## 2016-08-25 ENCOUNTER — Ambulatory Visit
Admission: RE | Admit: 2016-08-25 | Discharge: 2016-08-25 | Disposition: A | Discharge status: Home / Self Care | Payer: Medicare Other | Source: Ambulatory Visit | Attending: Obstetrics and Gynecology | Admitting: Obstetrics and Gynecology

## 2016-08-25 DIAGNOSIS — N63 Unspecified lump in unspecified breast: Secondary | ICD-10-CM

## 2016-08-25 DIAGNOSIS — N6310 Unspecified lump in the right breast, unspecified quadrant: Secondary | ICD-10-CM | POA: Diagnosis not present

## 2016-08-27 ENCOUNTER — Ambulatory Visit
Payer: Medicare Other | Attending: Student in an Organized Health Care Education/Training Program | Admitting: Occupational Therapy

## 2016-08-27 DIAGNOSIS — I890 Lymphedema, not elsewhere classified: Secondary | ICD-10-CM | POA: Insufficient documentation

## 2016-08-28 ENCOUNTER — Other Ambulatory Visit: Payer: Self-pay | Admitting: Neurology

## 2016-09-01 NOTE — Therapy (Signed)
[facility] [address] [place], [place], [address] Phone: [phone]   Fax:  [phone]  Occupational Therapy Evaluation  Patient Details  Name: Alison Fox MRN: [id number] Date of Birth: [birth year] No Data Recorded  Encounter Date: 08/27/2016      OT End of Session - 09/01/16 1352    Visit Number 1   Number of Visits 36   Date for OT Re-Evaluation 11/30/16      Past Medical History:  Diagnosis Date  . Glaucoma 08/22/2014  . Headache   . 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 On 01/21/15, she saw [facility]. Con[facility]ita [facility]undkumar at [facility].  She had a repeat MRI of the brain without contrast which demonstrated symmetric ventriculomegaly of the lateral ventricles and third ventricle without significant transependymal flow.  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  Endoscopic third ventriculostomy was offered but not insisted since her only symptoms are headache, which is now mild.  She did not wish to proceed with this procedure.  . Lymphedema    Chronic following left knee surgery in 2000.  . Varicose veins     Past Surgical History:  Procedure Laterality Date  . KNEE ARTHROSCOPY  2000   Left knee.     There were no vitals filed for this visit.      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 Pt was last seen on 12/02/2015 after successfully completing Intensive Phase Complete Decongestive Therapy (CDT ) with this therapist. Pt states she is eager to reduce leg swelling again and replace her worn out stockings. She reports that she does not wear HOS devices ifitted last course of CDT because she doesn't like sleeping in them. She reports she has been artially compliant w/ LE self care for skin  care, and she's been diligent with gatments even though Th LLE one falls down..   Patient is accompained by: Family member   Pertinent History onset in 2520s and positive family history for maternal grandmother and cousin suggests primary LE Tarda; arthroscopic knee sx ~ 2000?, recent fall w/ head laceration. 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   Limitations difficulty walking, decreased balance, suspected BLE suspected, R>L,     Patient Stated Goals replace my stockings and get the swelling down again.   Currently in Pain? Yes  leg pain not rated numerically   Pain Location Leg   Pain Orientation Right;Left   Pain Descriptors / Indicators Aching;Sore;Tender;Penetrating;Throbbing;Tightness;Discomfort;Dull;Tiring;Pressure;Guarding;Heaviness   Pain Type Chronic pain   Pain Onset More than a month ago   Pain Frequency Intermittent   Aggravating Factors  standing, walking   Effect of Pain on Daily Activities Lymphedema related pain and leg swelling  causes difficulty walking, decreased activity tolerance 2/2 increased pain w/ standing, decreased basic and instrumental ADLs requiring standing and walking > 15 minutes, decreased ability to fot street shoes and LB clothing, decreased productive activities, including 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 Provided printed Lymphedema Workbook for reference.   Person(s) Educated Patient;Parent(s)   Methods Explanation;Demonstration;Handout   Comprehension Verbalized understanding;Need further instruction          OT Short Term Goals - 09/01/16 1356      OT SHORT TERM GOAL #1   Title Lymphedema (LE) management/ self-care: Pt able to apply multi layered, gradient compression wraps with MAX caregiver assistance using proper techniques within 2 weeks to achieve optimal limb volume reduction.   Baseline max caregiver assist; caregiver min assist   Time 2   Period Weeks   Status New     OT SHORT TERM GOAL #2   Title Lymphedema (LE) management/ self-care:  Pt to achieve at least 10% LLE limb volume reductions bilaterally during Intensive CDT to limit LE progression, decrease infection and falls risk, to reduce pain/, and to improve safe ambulation and functional mobility.   Baseline max assist   Time 12   Period Weeks   Status New     OT SHORT TERM GOAL #3   Title Lymphedema (LE) management/ self-care:  Pt >/= 85 % compliant with all daily, LE self-care protocols for home program w/ needed level of caregiver assistance , including simple self-manual lymphatic drainage (MLD), skin care, lymphatic pumping the ex, skin care, and donning/ doffing compression wraps and garments o limit LE progression and further functional decline.     Baseline Max A   Time 12   Period Weeks   Status New     OT SHORT TERM GOAL #4   Title Lymphedema (LE) management/ self-care:  Pt to tolerate daily compression wraps, garments and devices in keeping w/ prescribed wear regime within 1 week of issue date to progress and retain clinical and functional gains and to limit LE progression.   Baseline mod A   Time 12   Period Weeks    Status New     OT SHORT TERM GOAL #5   Title Lymphedema (LE) self-care:  During Management Phase CDT Pt to sustain limb volume reductions achieved during Intensive Phase CDT within 5% utilizing LE self-care protocols, appropriate compression garments/ devices, and needed level of caregiver assistance.   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 She returns today after successfully completing BLE Intensive Phase CDT in 11/2015. Leg swelling and pain is exacerbated at present due to poor containment  by worn out custom daytiime compression garments, non compliance with essential LE self care regimes, (including HOS compression , self MLD ather ex).  BLE swelling and pain currently limits walking and standing tolerance, limits basic and instrumental ADLs performance, productive activities at work, and participation in community and social activities. Body asymmetry 2/2 LE has contributed to hx of infection and to hx of falls and loss of balance. Without skilled Occupational Therapy for Complete Decongestive Therapy (  CDT) to address chronic, progressive BLE LE, this condition will worsen and further functional decline is expected.   Rehab Potential Good   Clinical Impairments Affecting Rehab Potential see SUBJECTIVE section for list of functional impairments 2/2 BLE LE   OT Frequency 3x / week   OT Duration 12 weeks   OT Treatment/Interventions Self-care/ADL training;Therapeutic exercise;Patient/family education;Manual Therapy;Manual lymph drainage;Therapeutic exercises;DME and/or AE instruction;Compression bandaging;Therapeutic activities;Scar mobilization;Other (comment)  skin care to limit infection risk and improve flexibility/ AROM   Plan CDT to LLE first, then RLE to limit falls risk. Replace BLE custom,  ccl 2 and 3 daytime compression garments. Review all home program components. Scedule follow ups during regular intervals during Management Phase CDT to support LE self care to limit exacerbation and repeated Intensive Phase courses      Patient will benefit from skilled therapeutic intervention in order to improve the following deficits and impairments:  Abnormal gait, Decreased skin integrity, Decreased knowledge of precautions, Decreased scar mobility, Impaired perceived functional ability, Improper body mechanics, Decreased activity tolerance, Decreased knowledge of use of DME, Impaired flexibility, Decreased balance, Difficulty walking, Obesity, Decreased range of motion, Increased edema, Pain  Visit Diagnosis: Lymphedema, not elsewhere classified - Plan: Ot plan of care cert/re-cert  Lymphedema      G-Codes - 09/01/16 1417    Functional Limitation Self care   Self Care Current Status [phone]([id number]) At least 60 percent but less than 80 percent impaired, limited or restricted   Self Care Goal Status ([id number]) At least 20 percent but less than 40 percent impaired, limited or restricted      Problem List Patient Active Problem List   Diagnosis Date Noted  . Episodic tension-type headache, not intractable 07/22/2015  . Hydrocephalus, adult 09/10/2014  . Glaucoma 08/22/2014  . Preventative health care 05/18/2011  . Lymphedema 08/06/2006    Loel Dubonnetheresa Leasha Goldberger, MS, OTR/L, [employer] 09/01/16 2:27 PM  [facility] [address] [place], [place], [address] Phone: [phone]   Fax:  [phone]  Name: Alison Fox MRN: [id number] Date of Birth: 07/23/1971

## 2016-09-01 NOTE — Patient Instructions (Signed)

## 2016-09-21 ENCOUNTER — Ambulatory Visit
Payer: Medicare Other | Attending: Student in an Organized Health Care Education/Training Program | Admitting: Occupational Therapy

## 2016-09-21 DIAGNOSIS — I890 Lymphedema, not elsewhere classified: Secondary | ICD-10-CM | POA: Insufficient documentation

## 2016-09-21 NOTE — Patient Instructions (Signed)
.  [email or web address]

## 2016-09-21 NOTE — Therapy (Signed)
[place] MAIN [facility] SERVICES [address] [place], [place], [address] Phone: [phone]   Fax:  [phone]  Occupational Therapy Treatment  Patient Details  Name: Alison Fox MRN: [id number] Date of Birth: November 12, 1970 No Data Recorded  Encounter Date: 09/21/2016      OT End of Session - 09/21/16 1226    Visit Number 1   Number of Visits 36   Date for OT Re-Evaluation 11/30/16   OT Start Time 0904   OT Stop Time 1004   OT Time Calculation (min) 60 min      Past Medical History:  Diagnosis Date  . Glaucoma 08/22/2014  . Headache   . 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 On 01/21/15, she saw Dr. Kathyrn Sheriff at [facility].  She had a repeat MRI of the brain without contrast which demonstrated symmetric ventriculomegaly of the lateral ventricles and third ventricle without significant transependymal flow.  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  Endoscopic third ventriculostomy was offered but not insisted since her only symptoms are headache, which is now mild.  She did not wish to proceed with this procedure.  . Lymphedema    Chronic following left knee surgery in 2000.  . Varicose veins     Past Surgical History:  Procedure Laterality Date  . KNEE ARTHROSCOPY  2000   Left knee.     There were no vitals filed for this visit.      Subjective Assessment - 09/21/16 1000    Subjective  Pt presnts for OT visit 2 of Intensive Phase CDT to treat BLE lymphedema. Pt is accompanied by her mother today. Pt tells me that the compression wraps used for last intensive are worn out and unusable. We replaced compression wraps for LLE today with same configuration used in the past.   Patient is accompained by: Family member   Pertinent History onset in 83s and positive family history for maternal  grandmother and cousin suggests primary LE Tarda; arthroscopic knee sx ~ 2000?, recent fall w/ head laceration. 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   Limitations difficulty walking, decreased balance, suspected BLE suspected, R>L,     Patient Stated Goals replace my stockings and get the swelling down again.   Currently in Pain? No/denies   Pain Onset More than a month ago                      OT Treatments/Exercises (OP) - 09/21/16 0001      ADLs   ADL Education Given Yes     Manual Therapy   Soft tissue mobilization fibrosis technique at L medial and lateral malleolii, scar massage   Manual Lymphatic Drainage (MLD) Manual lymph drainage (MLD) to LLE in supine utilizing functional inguinal lymph nodes and deep abdominal lymphatics as is customary for non-cancer related lower extremity LE, including bilateral "short neck" sequence, J strokes to sub and supraclavicular LN, deep abdominal pathways, functional inguinal LN, lower extremity proximal to distal w/ emphasis on medial knee bottleneck and politeal LN. Performed fibrosis technique to B maleoli and distal  legs to address fatty fibrosis. Good tolerance.   Compression Bandaging LLE gradient compression wraps applied from base  of toes to below knee as follows:   Other Manual Therapy skin care LLE                OT Education - 09/21/16 1225    Education provided Yes   Education Details Emphasis of Pt and caregiver edu today on LE self care and precautions review   Person(s) Educated Patient;Parent(s)          OT Short Term Goals - 09/01/16 1356      OT SHORT TERM GOAL #1   Title Lymphedema (LE) management/ self-care: Pt able to apply multi layered, gradient compression wraps with MAX caregiver assistance using proper  techniques within 2 weeks to achieve optimal limb volume reduction.   Baseline max caregiver assist; caregiver min assist   Time 2   Period Weeks   Status New     OT SHORT TERM GOAL #2   Title Lymphedema (LE) management/ self-care:  Pt to achieve at least 10% LLE limb volume reductions bilaterally during Intensive CDT to limit LE progression, decrease infection and falls risk, to reduce pain/, and to improve safe ambulation and functional mobility.   Baseline max assist   Time 12   Period Weeks   Status New     OT SHORT TERM GOAL #3   Title Lymphedema (LE) management/ self-care:  Pt >/= 85 % compliant with all daily, LE self-care protocols for home program w/ needed level of caregiver assistance , including simple self-manual lymphatic drainage (MLD), skin care, lymphatic pumping the ex, skin care, and donning/ doffing compression wraps and garments o limit LE progression and further functional decline.     Baseline Max A   Time 12   Period Weeks   Status New     OT SHORT TERM GOAL #4   Title Lymphedema (LE) management/ self-care:  Pt to tolerate daily compression wraps, garments and devices in keeping w/ prescribed wear regime within 1 week of issue date to progress and retain clinical and functional gains and to limit LE progression.   Baseline mod A   Time 12   Period Weeks   Status New     OT SHORT TERM GOAL #5   Title Lymphedema (LE) self-care:  During Management Phase CDT Pt to sustain limb volume reductions achieved during Intensive Phase CDT within 5% utilizing LE self-care protocols, appropriate compression garments/ devices, and needed level of caregiver assistance.   Baseline Max A   Time 6   Period Months   Status New           OT Long Term Goals - 09/02/15 1205      OT LONG TERM GOAL #1   Title Pt able to correctly apply gradient compression wraps to below knee with moderate caregiver assistance for optimal limb volume reduction and improvement in tissue  integrity.   Baseline dependent    Time 2   Period Weeks   Status Achieved     OT LONG TERM GOAL #2   Title Pt to achieve 15% limb volume reduction in LLE, and 10% reduction in RLE by DC to limit lymphedema (LE) progression and infection risk.- NEW GOAL 25%   Baseline dependent   Time 12   Status Partially Met     OT LONG TERM GOAL #3   Title Pt > 75% compliant with all daily LE self-care protocols w/ moderate caregiver assistance, including simple self-manual lymphatic drainage (MLD), skin care, lymphatic pumping therex, and donning/ doffing progression garments.   Baseline  dependent   Time 12   Period Weeks   Status Partially Met     OT LONG TERM GOAL #6   Title During Management Phase CDT Pt to sustain limb volume reductions achieved during Intensive Phase CDT within 5% utilizing LE self-care protocols, appropriate compression garments/ devices, and moderate caregiver assistance.   Baseline dependent   Time 6   Status On-going               Plan - 09/21/16 1226    Clinical Impression Statement Commenced second course of Intensive Phase CDT to LLE today. Pt tolerated all aspects of therapy, skin car, MLD, and compression wrapping, without difficulty. Cont as per POC.Assess CG wrap application next visit and review as needed.   Rehab Potential Good   Clinical Impairments Affecting Rehab Potential see SUBJECTIVE section for list of functional impairments 2/2 BLE LE   OT Frequency 3x / week   OT Duration 12 weeks   OT Treatment/Interventions Self-care/ADL training;Therapeutic exercise;Patient/family education;Manual Therapy;Manual lymph drainage;Therapeutic exercises;DME and/or AE instruction;Compression bandaging;Therapeutic activities;Scar mobilization;Other (comment)  skin care to limit infection risk and improve flexibility/ AROM      Patient will benefit from skilled therapeutic intervention in order to improve the following deficits and impairments:  Abnormal gait,  Decreased skin integrity, Decreased knowledge of precautions, Decreased scar mobility, Impaired perceived functional ability, Improper body mechanics, Decreased activity tolerance, Decreased knowledge of use of DME, Impaired flexibility, Decreased balance, Difficulty walking, Obesity, Decreased range of motion, Increased edema, Pain  Visit Diagnosis: Lymphedema    Problem List Patient Active Problem List   Diagnosis Date Noted  . Episodic tension-type headache, not intractable 07/22/2015  . Hydrocephalus, adult 09/10/2014  . Glaucoma 08/22/2014  . Preventative health care 05/18/2011  . Lymphedema 08/06/2006    Andrey Spearman, MS, OTR/L, [facility] 09/21/16 12:30 PM  [place] MAIN [facility] SERVICES [address] [place], [place], [address] Phone: [phone]   Fax:  [phone]  Name: Alison Fox MRN: [id number] Date of Birth: 1971-02-25

## 2016-09-23 ENCOUNTER — Ambulatory Visit: Payer: Medicare Other | Admitting: Occupational Therapy

## 2016-09-23 DIAGNOSIS — I890 Lymphedema, not elsewhere classified: Secondary | ICD-10-CM | POA: Diagnosis not present

## 2016-09-23 NOTE — Therapy (Signed)
[place] MAIN [facility] SERVICES [address] [place], [place], [address] Phone: [phone]   Fax:  [phone]  Occupational Therapy Treatment  Patient Details  Name: Alison Fox MRN: [id number] Date of Birth: November 04, 1970 No Data Recorded  Encounter Date: 09/23/2016      OT End of Session - 09/23/16 1133    Visit Number 3   Number of Visits 36   Date for OT Re-Evaluation 11/30/16   OT Start Time 0900   OT Stop Time 1005   OT Time Calculation (min) 65 min      Past Medical History:  Diagnosis Date  . Glaucoma 08/22/2014  . Headache   . 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 On 01/21/15, she saw Dr. Kathyrn Sheriff at [facility].  She had a repeat MRI of the brain without contrast which demonstrated symmetric ventriculomegaly of the lateral ventricles and third ventricle without significant transependymal flow.  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  Endoscopic third ventriculostomy was offered but not insisted since her only symptoms are headache, which is now mild.  She did not wish to proceed with this procedure.  . Lymphedema    Chronic following left knee surgery in 2000.  . Varicose veins     Past Surgical History:  Procedure Laterality Date  . KNEE ARTHROSCOPY  2000   Left knee.     There were no vitals filed for this visit.      Subjective Assessment - 09/23/16 0910    Subjective  Pt presnts for OT visit 3 of Intensive Phase CDT to treat BLE lymphedema. Pt is accompanied by her mother today. Pt and mother report they had no difficulty applying or tolerating wraps during visit interval.   Patient is accompained by: Family member   Pertinent History onset in 69s and positive family history for maternal grandmother and cousin suggests primary LE Tarda; arthroscopic knee sx ~ 2000?,  recent fall w/ head laceration. 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   Limitations difficulty walking, decreased balance, suspected BLE suspected, R>L,     Patient Stated Goals replace my stockings and get the swelling down again.   Pain Onset More than a month ago                      OT Treatments/Exercises (OP) - 09/23/16 0001      ADLs   ADL Education Given (P)  Yes     Manual Therapy   Manual Therapy (P)  Edema management;Manual Lymphatic Drainage (MLD);Compression Bandaging   Soft tissue mobilization (P)  fibrosis technique at L medial and lateral malleolii, scar massage   Manual Lymphatic Drainage (MLD) (P)  Manual lymph drainage (MLD) to LLE in supine utilizing functional inguinal lymph nodes and deep abdominal lymphatics as is customary for non-cancer related lower extremity LE, including bilateral "short neck" sequence, J strokes to sub and supraclavicular LN, deep abdominal pathways, functional inguinal LN, lower extremity proximal to distal w/ emphasis on medial knee bottleneck and politeal LN. Performed fibrosis technique to B maleoli and distal  legs to address fatty fibrosis. Good tolerance.   Compression Bandaging (P)  LLE gradient compression wraps applied from base of  toes to below knee as follows:   Other Manual Therapy (P)  skin care LLE                OT Education - 09/23/16 0911    Education provided Yes   Education Details Emphasis of Pt edu for LE self care today on cellulitis symptoms, treatment, and precautions.   Person(s) Educated Patient;Parent(s)   Methods Explanation;Handout   Comprehension Verbalized understanding;Verbal cues required;Need further instruction          OT Short Term Goals - 09/01/16 1356      OT SHORT TERM GOAL #1   Title  Lymphedema (LE) management/ self-care: Pt able to apply multi layered, gradient compression wraps with MAX caregiver assistance using proper techniques within 2 weeks to achieve optimal limb volume reduction.   Baseline max caregiver assist; caregiver min assist   Time 2   Period Weeks   Status New     OT SHORT TERM GOAL #2   Title Lymphedema (LE) management/ self-care:  Pt to achieve at least 10% LLE limb volume reductions bilaterally during Intensive CDT to limit LE progression, decrease infection and falls risk, to reduce pain/, and to improve safe ambulation and functional mobility.   Baseline max assist   Time 12   Period Weeks   Status New     OT SHORT TERM GOAL #3   Title Lymphedema (LE) management/ self-care:  Pt >/= 85 % compliant with all daily, LE self-care protocols for home program w/ needed level of caregiver assistance , including simple self-manual lymphatic drainage (MLD), skin care, lymphatic pumping the ex, skin care, and donning/ doffing compression wraps and garments o limit LE progression and further functional decline.     Baseline Max A   Time 12   Period Weeks   Status New     OT SHORT TERM GOAL #4   Title Lymphedema (LE) management/ self-care:  Pt to tolerate daily compression wraps, garments and devices in keeping w/ prescribed wear regime within 1 week of issue date to progress and retain clinical and functional gains and to limit LE progression.   Baseline mod A   Time 12   Period Weeks   Status New     OT SHORT TERM GOAL #5   Title Lymphedema (LE) self-care:  During Management Phase CDT Pt to sustain limb volume reductions achieved during Intensive Phase CDT within 5% utilizing LE self-care protocols, appropriate compression garments/ devices, and needed level of caregiver assistance.   Baseline Max A   Time 6   Period Months   Status New           OT Long Term Goals - 09/02/15 1205      OT LONG TERM GOAL #1   Title Pt able to correctly apply  gradient compression wraps to below knee with moderate caregiver assistance for optimal limb volume reduction and improvement in tissue integrity.   Baseline dependent    Time 2   Period Weeks   Status Achieved     OT LONG TERM GOAL #2   Title Pt to achieve 15% limb volume reduction in LLE, and 10% reduction in RLE by DC to limit lymphedema (LE) progression and infection risk.- NEW GOAL 25%   Baseline dependent   Time 12   Status Partially Met     OT LONG TERM GOAL #3   Title Pt > 75% compliant with all daily LE self-care protocols w/ moderate caregiver assistance, including simple self-manual lymphatic  drainage (MLD), skin care, lymphatic pumping therex, and donning/ doffing progression garments.   Baseline dependent   Time 12   Period Weeks   Status Partially Met     OT LONG TERM GOAL #6   Title During Management Phase CDT Pt to sustain limb volume reductions achieved during Intensive Phase CDT within 5% utilizing LE self-care protocols, appropriate compression garments/ devices, and moderate caregiver assistance.   Baseline dependent   Time 6   Status On-going               Plan - 09/23/16 1134    Clinical Impression Statement LLE responding to CDT as evidenced by visible decrease in leg swelling and palpable decrease in tissue density since last visit. Pt's mother is managing compression wraps application well. Pt tolerating compression without difficulty . Pt also performing skin care regime as directed. Good session. Cont as per POC.   Rehab Potential Good   Clinical Impairments Affecting Rehab Potential see SUBJECTIVE section for list of functional impairments 2/2 BLE LE   OT Frequency 3x / week   OT Duration 12 weeks   OT Treatment/Interventions Self-care/ADL training;Therapeutic exercise;Patient/family education;Manual Therapy;Manual lymph drainage;Therapeutic exercises;DME and/or AE instruction;Compression bandaging;Therapeutic activities;Scar mobilization;Other  (comment)  skin care to limit infection risk and improve flexibility/ AROM      Patient will benefit from skilled therapeutic intervention in order to improve the following deficits and impairments:  Abnormal gait, Decreased skin integrity, Decreased knowledge of precautions, Decreased scar mobility, Impaired perceived functional ability, Improper body mechanics, Decreased activity tolerance, Decreased knowledge of use of DME, Impaired flexibility, Decreased balance, Difficulty walking, Obesity, Decreased range of motion, Increased edema, Pain  Visit Diagnosis: Lymphedema, not elsewhere classified    Problem List Patient Active Problem List   Diagnosis Date Noted  . Episodic tension-type headache, not intractable 07/22/2015  . Hydrocephalus, adult 09/10/2014  . Glaucoma 08/22/2014  . Preventative health care 05/18/2011  . Lymphedema 08/06/2006   Andrey Spearman, MS, OTR/L, [facility] 09/23/16 11:37 AM  [place] MAIN [facility] SERVICES [address] [place], [place], [address] Phone: [phone]   Fax:  [phone]  Name: Alison Fox MRN: [id number] Date of Birth: 01-10-1971

## 2016-09-25 ENCOUNTER — Ambulatory Visit: Payer: Medicare Other | Admitting: Occupational Therapy

## 2016-09-25 DIAGNOSIS — I890 Lymphedema, not elsewhere classified: Secondary | ICD-10-CM

## 2016-09-25 NOTE — Therapy (Signed)
[place] MAIN [facility] SERVICES [address] [place], [place], [address] Phone: [phone]   Fax:  [phone]  Occupational Therapy Treatment  Patient Details  Name: Alison Fox MRN: [id number] Date of Birth: [birth year] No Data Recorded  Encounter Date: 09/25/2016      OT End of Session - 09/25/16 1505    Visit Number 4   Number of Visits 36   Date for OT Re-Evaluation 11/30/16   OT Start Time 0917   OT Stop Time 1015   OT Time Calculation (min) 58 min      Past Medical History:  Diagnosis Date  . Glaucoma 08/22/2014  . Headache   . 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 On 01/21/15, she saw Dr. Kathyrn Sheriff at [facility].  She had a repeat MRI of the brain without contrast which demonstrated symmetric ventriculomegaly of the lateral ventricles and third ventricle without significant transependymal flow.  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  Endoscopic third ventriculostomy was offered but not insisted since her only symptoms are headache, which is now mild.  She did not wish to proceed with this procedure.  . Lymphedema    Chronic following left knee surgery in 2000.  . Varicose veins     Past Surgical History:  Procedure Laterality Date  . KNEE ARTHROSCOPY  2000   Left knee.     There were no vitals filed for this visit.      Subjective Assessment - 09/25/16 1009    Subjective  Pt presnts for OT visit 4 of Intensive Phase CDT to treat BLE lymphedema. Pt is accompanied by her mother today. Pt and mother report they had no difficulty applying or tolerating wraps during visit interval.   Patient is accompained by: Family member   Pertinent History onset in 20s and positive family history for maternal grandmother and cousin suggests primary LE Tarda; arthroscopic knee sx ~ 2000?,  recent fall w/ head laceration. 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   Limitations difficulty walking, decreased balance, suspected BLE suspected, R>L,     Patient Stated Goals replace my stockings and get the swelling down again.   Currently in Pain? No/denies   Pain Onset More than a month ago                      OT Treatments/Exercises (OP) - 09/25/16 0001      ADLs   ADL Education Given Yes     Manual Therapy   Manual Therapy Edema management;Manual Lymphatic Drainage (MLD);Compression Bandaging   Soft tissue mobilization fibrosis technique at L medial and lateral malleolii, scar massage   Manual Lymphatic Drainage (MLD) Manual lymph drainage (MLD) to LLE in supine utilizing functional inguinal lymph nodes and deep abdominal lymphatics as is customary for non-cancer related lower extremity LE, including bilateral "short neck" sequence, J strokes to sub and supraclavicular LN, deep abdominal pathways, functional inguinal LN, lower extremity proximal to distal w/ emphasis on medial knee bottleneck and politeal LN. Performed fibrosis technique to B maleoli and distal  legs to address fatty fibrosis. Good tolerance.   Compression Bandaging LLE gradient compression wraps applied from base of toes to below knee  as follows:   Other Manual Therapy skin care LLE                OT Education - 09/25/16 1011    Education provided Yes   Education Details Cont Pt edu for LE self care throughout session   Person(s) Educated Patient;Parent(s)   Methods Explanation;Demonstration;Handout          OT Short Term Goals - 09/01/16 1356      OT SHORT TERM GOAL #1   Title Lymphedema (LE) management/ self-care: Pt able to apply multi layered, gradient compression wraps with MAX caregiver  assistance using proper techniques within 2 weeks to achieve optimal limb volume reduction.   Baseline max caregiver assist; caregiver min assist   Time 2   Period Weeks   Status New     OT SHORT TERM GOAL #2   Title Lymphedema (LE) management/ self-care:  Pt to achieve at least 10% LLE limb volume reductions bilaterally during Intensive CDT to limit LE progression, decrease infection and falls risk, to reduce pain/, and to improve safe ambulation and functional mobility.   Baseline max assist   Time 12   Period Weeks   Status New     OT SHORT TERM GOAL #3   Title Lymphedema (LE) management/ self-care:  Pt >/= 85 % compliant with all daily, LE self-care protocols for home program w/ needed level of caregiver assistance , including simple self-manual lymphatic drainage (MLD), skin care, lymphatic pumping the ex, skin care, and donning/ doffing compression wraps and garments o limit LE progression and further functional decline.     Baseline Max A   Time 12   Period Weeks   Status New     OT SHORT TERM GOAL #4   Title Lymphedema (LE) management/ self-care:  Pt to tolerate daily compression wraps, garments and devices in keeping w/ prescribed wear regime within 1 week of issue date to progress and retain clinical and functional gains and to limit LE progression.   Baseline mod A   Time 12   Period Weeks   Status New     OT SHORT TERM GOAL #5   Title Lymphedema (LE) self-care:  During Management Phase CDT Pt to sustain limb volume reductions achieved during Intensive Phase CDT within 5% utilizing LE self-care protocols, appropriate compression garments/ devices, and needed level of caregiver assistance.   Baseline Max A   Time 6   Period Months   Status New           OT Long Term Goals - 09/02/15 1205      OT LONG TERM GOAL #1   Title Pt able to correctly apply gradient compression wraps to below knee with moderate caregiver assistance for optimal limb volume reduction and  improvement in tissue integrity.   Baseline dependent    Time 2   Period Weeks   Status Achieved     OT LONG TERM GOAL #2   Title Pt to achieve 15% limb volume reduction in LLE, and 10% reduction in RLE by DC to limit lymphedema (LE) progression and infection risk.- NEW GOAL 25%   Baseline dependent   Time 12   Status Partially Met     OT LONG TERM GOAL #3   Title Pt > 75% compliant with all daily LE self-care protocols w/ moderate caregiver assistance, including simple self-manual lymphatic drainage (MLD), skin care, lymphatic pumping therex, and donning/ doffing progression garments.   Baseline dependent   Time 12  Period Weeks   Status Partially Met     OT LONG TERM GOAL #6   Title During Management Phase CDT Pt to sustain limb volume reductions achieved during Intensive Phase CDT within 5% utilizing LE self-care protocols, appropriate compression garments/ devices, and moderate caregiver assistance.   Baseline dependent   Time 6   Status On-going               Plan - 09/25/16 1506    Clinical Impression Statement Pt demonstrates progress towards all OT goals thus far. Limb volumes are decreasing. Tissue desoity is softening, Pt is reviewing simple self MLD, and she is tolerating all aspects of therapy. Cont as per POC.   Rehab Potential Good   Clinical Impairments Affecting Rehab Potential see SUBJECTIVE section for list of functional impairments 2/2 BLE LE   OT Frequency 3x / week   OT Duration 12 weeks   OT Treatment/Interventions Self-care/ADL training;Therapeutic exercise;Patient/family education;Manual Therapy;Manual lymph drainage;Therapeutic exercises;DME and/or AE instruction;Compression bandaging;Therapeutic activities;Scar mobilization;Other (comment)  skin care to limit infection risk and improve flexibility/ AROM      Patient will benefit from skilled therapeutic intervention in order to improve the following deficits and impairments:  Abnormal gait,  Decreased skin integrity, Decreased knowledge of precautions, Decreased scar mobility, Impaired perceived functional ability, Improper body mechanics, Decreased activity tolerance, Decreased knowledge of use of DME, Impaired flexibility, Decreased balance, Difficulty walking, Obesity, Decreased range of motion, Increased edema, Pain  Visit Diagnosis: Lymphedema, not elsewhere classified    Problem List Patient Active Problem List   Diagnosis Date Noted  . Episodic tension-type headache, not intractable 07/22/2015  . Hydrocephalus, adult 09/10/2014  . Glaucoma 08/22/2014  . Preventative health care 05/18/2011  . Lymphedema 08/06/2006    Andrey Spearman, MS, OTR/L, [facility] 09/25/16 3:08 PM  [place] MAIN [facility] SERVICES [address] [place], [place], [address] Phone: [phone]   Fax:  [phone]  Name: Alison Fox MRN: [id number] Date of Birth: 1971-09-17

## 2016-09-25 NOTE — Patient Instructions (Signed)
LE instructions and precautions as established- see initial eval.   

## 2016-09-28 ENCOUNTER — Ambulatory Visit: Payer: Medicare Other | Admitting: Occupational Therapy

## 2016-09-28 DIAGNOSIS — I890 Lymphedema, not elsewhere classified: Secondary | ICD-10-CM

## 2016-09-28 NOTE — Patient Instructions (Signed)
LE instructions and precautions as established- see initial eval.   

## 2016-09-28 NOTE — Therapy (Signed)
[place] MAIN [facility] SERVICES [address] [place], [place], [address] Phone: [phone]   Fax:  [phone]  Occupational Therapy Treatment  Patient Details  Name: Alison Fox MRN: [id number] Date of Birth: 1971/08/06 No Data Recorded  Encounter Date: 09/28/2016      OT End of Session - 09/28/16 1001    Visit Number 5   Number of Visits 36   Date for OT Re-Evaluation 11/30/16   OT Start Time 0906   OT Stop Time 1002   OT Time Calculation (min) 56 min      Past Medical History:  Diagnosis Date  . Glaucoma 08/22/2014  . Headache   . 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 On 01/21/15, she saw Dr. Kathyrn Sheriff at [facility].  She had a repeat MRI of the brain without contrast which demonstrated symmetric ventriculomegaly of the lateral ventricles and third ventricle without significant transependymal flow.  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  Endoscopic third ventriculostomy was offered but not insisted since her only symptoms are headache, which is now mild.  She did not wish to proceed with this procedure.  . Lymphedema    Chronic following left knee surgery in 2000.  . Varicose veins     Past Surgical History:  Procedure Laterality Date  . KNEE ARTHROSCOPY  2000   Left knee.     There were no vitals filed for this visit.      Subjective Assessment - 09/28/16 0916    Subjective  Pt presnts for OT visit 5 of Intensive Phase CDT to treat BLE lymphedema. Pt is accompanied by her mother today. Pt has no new complaints. She reports no difficulties related to LE self care over the weekend.   Patient is accompained by: Family member   Pertinent History onset in 53s and positive family history for maternal grandmother and cousin suggests primary LE Tarda; arthroscopic knee sx ~ 2000?,  recent fall w/ head laceration. 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   Limitations difficulty walking, decreased balance, suspected BLE suspected, R>L,     Patient Stated Goals replace my stockings and get the swelling down again.   Pain Onset More than a month ago                      OT Treatments/Exercises (OP) - 09/28/16 0001      ADLs   ADL Education Given Yes     Manual Therapy   Manual Therapy Edema management;Manual Lymphatic Drainage (MLD);Compression Bandaging   Soft tissue mobilization fibrosis technique at L medial and lateral malleolii, scar massage   Manual Lymphatic Drainage (MLD) Manual lymph drainage (MLD) to LLE in supine utilizing functional inguinal lymph nodes and deep abdominal lymphatics as is customary for non-cancer related lower extremity LE, including bilateral "short neck" sequence, J strokes to sub and supraclavicular LN, deep abdominal pathways, functional inguinal LN, lower extremity proximal to distal w/ emphasis on medial knee bottleneck and politeal LN. Performed fibrosis technique to B maleoli and distal  legs to address fatty fibrosis. Good tolerance.   Compression Bandaging LLE gradient compression wraps applied from base of toes to below knee as follows:  Other Manual Therapy skin care LLE                OT Education - 09/28/16 1000    Education provided Yes   Education Details Con tinued skilled Pt/caregiver Education  And LE ADL training throughout visit for lymphedema self care, including compression wrapping, compression garment and device wear/care, lymphatic pumping ther ex, simple self-MLD, and skin care. Discussed progress towards goals.   Person(s) Educated Patient;Parent(s)   Methods Explanation;Demonstration   Comprehension Verbalized  understanding;Need further instruction          OT Short Term Goals - 09/01/16 1356      OT SHORT TERM GOAL #1   Title Lymphedema (LE) management/ self-care: Pt able to apply multi layered, gradient compression wraps with MAX caregiver assistance using proper techniques within 2 weeks to achieve optimal limb volume reduction.   Baseline max caregiver assist; caregiver min assist   Time 2   Period Weeks   Status New     OT SHORT TERM GOAL #2   Title Lymphedema (LE) management/ self-care:  Pt to achieve at least 10% LLE limb volume reductions bilaterally during Intensive CDT to limit LE progression, decrease infection and falls risk, to reduce pain/, and to improve safe ambulation and functional mobility.   Baseline max assist   Time 12   Period Weeks   Status New     OT SHORT TERM GOAL #3   Title Lymphedema (LE) management/ self-care:  Pt >/= 85 % compliant with all daily, LE self-care protocols for home program w/ needed level of caregiver assistance , including simple self-manual lymphatic drainage (MLD), skin care, lymphatic pumping the ex, skin care, and donning/ doffing compression wraps and garments o limit LE progression and further functional decline.     Baseline Max A   Time 12   Period Weeks   Status New     OT SHORT TERM GOAL #4   Title Lymphedema (LE) management/ self-care:  Pt to tolerate daily compression wraps, garments and devices in keeping w/ prescribed wear regime within 1 week of issue date to progress and retain clinical and functional gains and to limit LE progression.   Baseline mod A   Time 12   Period Weeks   Status New     OT SHORT TERM GOAL #5   Title Lymphedema (LE) self-care:  During Management Phase CDT Pt to sustain limb volume reductions achieved during Intensive Phase CDT within 5% utilizing LE self-care protocols, appropriate compression garments/ devices, and needed level of caregiver assistance.   Baseline Max A   Time 6   Period Months    Status New           OT Long Term Goals - 09/02/15 1205      OT LONG TERM GOAL #1   Title Pt able to correctly apply gradient compression wraps to below knee with moderate caregiver assistance for optimal limb volume reduction and improvement in tissue integrity.   Baseline dependent    Time 2   Period Weeks   Status Achieved     OT LONG TERM GOAL #2   Title Pt to achieve 15% limb volume reduction in LLE, and 10% reduction in RLE by DC to limit lymphedema (LE) progression and infection risk.- NEW GOAL 25%   Baseline dependent   Time 12   Status Partially Met     OT LONG TERM GOAL #3   Title Pt > 75% compliant with all daily LE  self-care protocols w/ moderate caregiver assistance, including simple self-manual lymphatic drainage (MLD), skin care, lymphatic pumping therex, and donning/ doffing progression garments.   Baseline dependent   Time 12   Period Weeks   Status Partially Met     OT LONG TERM GOAL #6   Title During Management Phase CDT Pt to sustain limb volume reductions achieved during Intensive Phase CDT within 5% utilizing LE self-care protocols, appropriate compression garments/ devices, and moderate caregiver assistance.   Baseline dependent   Time 6   Status On-going               Plan - 09/28/16 1227    Clinical Impression Statement RLE lymphedema responding well to OT for CDT. Pt and caregiver are compliant w/ compression during visit interval and Pt is diligent w/ skin care as directed. Cont as per POC. Consider sending garment specifications to DME vendor within a couple of weeks..   Rehab Potential Good   Clinical Impairments Affecting Rehab Potential see SUBJECTIVE section for list of functional impairments 2/2 BLE LE   OT Frequency 3x / week   OT Duration 12 weeks   OT Treatment/Interventions Self-care/ADL training;Therapeutic exercise;Patient/family education;Manual Therapy;Manual lymph drainage;Therapeutic exercises;DME and/or AE  instruction;Compression bandaging;Therapeutic activities;Scar mobilization;Other (comment)  skin care to limit infection risk and improve flexibility/ AROM      Patient will benefit from skilled therapeutic intervention in order to improve the following deficits and impairments:  Abnormal gait, Decreased skin integrity, Decreased knowledge of precautions, Decreased scar mobility, Impaired perceived functional ability, Improper body mechanics, Decreased activity tolerance, Decreased knowledge of use of DME, Impaired flexibility, Decreased balance, Difficulty walking, Obesity, Decreased range of motion, Increased edema, Pain  Visit Diagnosis: Lymphedema, not elsewhere classified    Problem List Patient Active Problem List   Diagnosis Date Noted  . Episodic tension-type headache, not intractable 07/22/2015  . Hydrocephalus, adult 09/10/2014  . Glaucoma 08/22/2014  . Preventative health care 05/18/2011  . Lymphedema 08/06/2006   Andrey Spearman, MS, OTR/L, [facility] 09/28/16 12:30 PM   [place] MAIN [facility] SERVICES [address] [place], [place], [address] Phone: [phone]   Fax:  [phone]  Name: Alison Fox MRN: [id number] Date of Birth: 1971/05/03

## 2016-09-30 ENCOUNTER — Ambulatory Visit: Payer: Medicare Other | Admitting: Occupational Therapy

## 2016-09-30 DIAGNOSIS — I890 Lymphedema, not elsewhere classified: Secondary | ICD-10-CM

## 2016-09-30 NOTE — Patient Instructions (Signed)
LE instructions and precautions as established- see initial eval.   

## 2016-09-30 NOTE — Therapy (Signed)
[place] MAIN [facility] SERVICES [address] [place], [place], [address] Phone: [phone]   Fax:  [phone]  Occupational Therapy Treatment  Patient Details  Name: Alison Fox MRN: [id number] Date of Birth: 30-Jan-1971 No Data Recorded  Encounter Date: 09/30/2016      OT End of Session - 09/30/16 1513    Visit Number 6   Number of Visits 36   Date for OT Re-Evaluation 11/30/16   OT Start Time 0907   OT Stop Time 1005   OT Time Calculation (min) 58 min      Past Medical History:  Diagnosis Date  . Glaucoma 08/22/2014  . Headache   . 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 On 01/21/15, she saw Dr. Kathyrn Sheriff at [facility].  She had a repeat MRI of the brain without contrast which demonstrated symmetric ventriculomegaly of the lateral ventricles and third ventricle without significant transependymal flow.  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  Endoscopic third ventriculostomy was offered but not insisted since her only symptoms are headache, which is now mild.  She did not wish to proceed with this procedure.  . Lymphedema    Chronic following left knee surgery in 2000.  . Varicose veins     Past Surgical History:  Procedure Laterality Date  . KNEE ARTHROSCOPY  2000   Left knee.     There were no vitals filed for this visit.      Subjective Assessment - 09/30/16 0913    Subjective  Pt presnts for OT visit 5 of Intensive Phase CDT to treat BLE lymphedema. Pt is accompanied by her mother today. Pt has no new complaints. She states she is managing well between visits w/ her mother's help .   Patient is accompained by: Family member   Pertinent History onset in 82s and positive family history for maternal grandmother and cousin suggests primary LE Tarda; arthroscopic knee sx ~ 2000?,  recent fall w/ head laceration. 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   Limitations difficulty walking, decreased balance, suspected BLE suspected, R>L,     Patient Stated Goals replace my stockings and get the swelling down again.   Currently in Pain? No/denies   Pain Onset More than a month ago                      OT Treatments/Exercises (OP) - 09/30/16 0001      ADLs   ADL Education Given Yes     Manual Therapy   Manual Therapy Edema management;Manual Lymphatic Drainage (MLD);Compression Bandaging   Soft tissue mobilization fibrosis technique at L medial and lateral malleolii, scar massage   Manual Lymphatic Drainage (MLD) Manual lymph drainage (MLD) to LLE in supine utilizing functional inguinal lymph nodes and deep abdominal lymphatics as is customary for non-cancer related lower extremity LE, including bilateral "short neck" sequence, J strokes to sub and supraclavicular LN, deep abdominal pathways, functional inguinal LN, lower extremity proximal to distal w/ emphasis on medial knee bottleneck and politeal LN. Performed fibrosis technique to B maleoli and distal  legs to address fatty fibrosis. Good tolerance.   Compression Bandaging LLE gradient compression wraps applied from base of toes  to below knee as follows:   Other Manual Therapy skin care LLE                OT Education - 09/30/16 0916    Education provided Yes   Education Details Con tinued skilled Pt/caregiver Education  And LE ADL training throughout visit for lymphedema self care, including compression wrapping, compression garment and device wear/care, lymphatic pumping ther ex, simple self-MLD, and skin care. Discussed progress towards goals.   Person(s) Educated Patient;Parent(s)   Methods  Explanation;Demonstration;Tactile cues;Verbal cues   Comprehension Verbalized understanding;Need further instruction;Tactile cues required;Verbal cues required;Returned demonstration          OT Short Term Goals - 09/01/16 1356      OT SHORT TERM GOAL #1   Title Lymphedema (LE) management/ self-care: Pt able to apply multi layered, gradient compression wraps with MAX caregiver assistance using proper techniques within 2 weeks to achieve optimal limb volume reduction.   Baseline max caregiver assist; caregiver min assist   Time 2   Period Weeks   Status New     OT SHORT TERM GOAL #2   Title Lymphedema (LE) management/ self-care:  Pt to achieve at least 10% LLE limb volume reductions bilaterally during Intensive CDT to limit LE progression, decrease infection and falls risk, to reduce pain/, and to improve safe ambulation and functional mobility.   Baseline max assist   Time 12   Period Weeks   Status New     OT SHORT TERM GOAL #3   Title Lymphedema (LE) management/ self-care:  Pt >/= 85 % compliant with all daily, LE self-care protocols for home program w/ needed level of caregiver assistance , including simple self-manual lymphatic drainage (MLD), skin care, lymphatic pumping the ex, skin care, and donning/ doffing compression wraps and garments o limit LE progression and further functional decline.     Baseline Max A   Time 12   Period Weeks   Status New     OT SHORT TERM GOAL #4   Title Lymphedema (LE) management/ self-care:  Pt to tolerate daily compression wraps, garments and devices in keeping w/ prescribed wear regime within 1 week of issue date to progress and retain clinical and functional gains and to limit LE progression.   Baseline mod A   Time 12   Period Weeks   Status New     OT SHORT TERM GOAL #5   Title Lymphedema (LE) self-care:  During Management Phase CDT Pt to sustain limb volume reductions achieved during Intensive Phase CDT within 5% utilizing LE self-care  protocols, appropriate compression garments/ devices, and needed level of caregiver assistance.   Baseline Max A   Time 6   Period Months   Status New           OT Long Term Goals - 09/02/15 1205      OT LONG TERM GOAL #1   Title Pt able to correctly apply gradient compression wraps to below knee with moderate caregiver assistance for optimal limb volume reduction and improvement in tissue integrity.   Baseline dependent    Time 2   Period Weeks   Status Achieved     OT LONG TERM GOAL #2   Title Pt to achieve 15% limb volume reduction in LLE, and 10% reduction in RLE by DC to limit lymphedema (LE) progression and infection risk.- NEW GOAL 25%   Baseline dependent   Time 12   Status Partially Met     OT LONG  TERM GOAL #3   Title Pt > 75% compliant with all daily LE self-care protocols w/ moderate caregiver assistance, including simple self-manual lymphatic drainage (MLD), skin care, lymphatic pumping therex, and donning/ doffing progression garments.   Baseline dependent   Time 12   Period Weeks   Status Partially Met     OT LONG TERM GOAL #6   Title During Management Phase CDT Pt to sustain limb volume reductions achieved during Intensive Phase CDT within 5% utilizing LE self-care protocols, appropriate compression garments/ devices, and moderate caregiver assistance.   Baseline dependent   Time 6   Status On-going               Plan - 09/30/16 1510    Clinical Impression Statement Pt continues to demonstrate improvements in limb volume reduction, skin integrity and decreasing density of LLE below the knee. Pt tolerating all aspects of therapy and managing well between visits with mother's assistance. Cont as per POC. Complete limb volumetrics LLE next session.   Rehab Potential Good   Clinical Impairments Affecting Rehab Potential see SUBJECTIVE section for list of functional impairments 2/2 BLE LE   OT Frequency 3x / week   OT Duration 12 weeks   OT  Treatment/Interventions Self-care/ADL training;Therapeutic exercise;Patient/family education;Manual Therapy;Manual lymph drainage;Therapeutic exercises;DME and/or AE instruction;Compression bandaging;Therapeutic activities;Scar mobilization;Other (comment)  skin care to limit infection risk and improve flexibility/ AROM      Patient will benefit from skilled therapeutic intervention in order to improve the following deficits and impairments:  Abnormal gait, Decreased skin integrity, Decreased knowledge of precautions, Decreased scar mobility, Impaired perceived functional ability, Improper body mechanics, Decreased activity tolerance, Decreased knowledge of use of DME, Impaired flexibility, Decreased balance, Difficulty walking, Obesity, Decreased range of motion, Increased edema, Pain  Visit Diagnosis: Lymphedema, not elsewhere classified    Problem List Patient Active Problem List   Diagnosis Date Noted  . Episodic tension-type headache, not intractable 07/22/2015  . Hydrocephalus, adult 09/10/2014  . Glaucoma 08/22/2014  . Preventative health care 05/18/2011  . Lymphedema 08/06/2006    Andrey Spearman, MS, OTR/L, [facility] 09/30/16 3:14 PM  [place] MAIN [facility] SERVICES [address] [place], [place], [address] Phone: [phone]   Fax:  [phone]  Name: Taetum Flewellen MRN: [id number] Date of Birth: Jul 29, 1971

## 2016-10-02 ENCOUNTER — Ambulatory Visit: Payer: Medicare Other | Admitting: Occupational Therapy

## 2016-10-02 DIAGNOSIS — I890 Lymphedema, not elsewhere classified: Secondary | ICD-10-CM

## 2016-10-02 NOTE — Therapy (Signed)
[place] MAIN [facility] SERVICES [address] [place], [place], [address] Phone: [phone]   Fax:  [phone]  Occupational Therapy Treatment  Patient Details  Name: Alison Fox MRN: [id number] Date of Birth: 05/27/1971 No Data Recorded  Encounter Date: 10/02/2016      OT End of Session - 10/02/16 1015    Visit Number 7   Number of Visits 36   Date for OT Re-Evaluation 11/30/16   OT Start Time 0915   OT Stop Time 1012   OT Time Calculation (min) 57 min      Past Medical History:  Diagnosis Date  . Glaucoma 08/22/2014  . Headache   . 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 On 01/21/15, she saw Dr. Kathyrn Sheriff at [facility].  She had a repeat MRI of the brain without contrast which demonstrated symmetric ventriculomegaly of the lateral ventricles and third ventricle without significant transependymal flow.  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  Endoscopic third ventriculostomy was offered but not insisted since her only symptoms are headache, which is now mild.  She did not wish to proceed with this procedure.  . Lymphedema    Chronic following left knee surgery in 2000.  . Varicose veins     Past Surgical History:  Procedure Laterality Date  . KNEE ARTHROSCOPY  2000   Left knee.     There were no vitals filed for this visit.      Subjective Assessment - 10/02/16 0918    Subjective  Pt presnts for OT visit 7 of Intensive Phase CDT to treat BLE lymphedema. Pt is accompanied by her mother today. Pt has no new complaints. She states she is managing well between visits w/ her mother's help .   Patient is accompained by: Family member   Pertinent History onset in 30s and positive family history for maternal grandmother and cousin suggests primary LE Tarda; arthroscopic knee sx ~ 2000?,  recent fall w/ head laceration. 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   Limitations difficulty walking, decreased balance, suspected BLE suspected, R>L,     Patient Stated Goals replace my stockings and get the swelling down again.   Pain Onset More than a month ago             LYMPHEDEMA/ONCOLOGY QUESTIONNAIRE - 10/02/16 1009      Left Lower Extremity Lymphedema   Other LLE ankle to knee (A-D) volume= 5032.85 ml. LLE ankle-groin volume= 9698.89 ml   Other LLE A-D volume DEcreased by 15.20%, and overall leg volume (A-G) is DEcreased by 10.42% since last measured on 08/27/16.                 OT Treatments/Exercises (OP) - 10/02/16 0001      ADLs   ADL Education Given Yes     Manual Therapy   Manual Therapy Edema management;Manual Lymphatic Drainage (MLD);Compression Bandaging   Soft tissue mobilization fibrosis technique at L medial and lateral malleolii, scar massage   Manual Lymphatic Drainage (MLD) Manual lymph drainage (MLD) to LLE in supine utilizing functional inguinal lymph nodes and deep abdominal lymphatics as is customary for non-cancer related lower extremity LE, including bilateral "short neck" sequence, J strokes  to sub and supraclavicular LN, deep abdominal pathways, functional inguinal LN, lower extremity proximal to distal w/ emphasis on medial knee bottleneck and politeal LN. Performed fibrosis technique to B maleoli and distal  legs to address fatty fibrosis. Good tolerance.   Compression Bandaging LLE gradient compression wraps applied from base of toes to below knee as follows:   Other Manual Therapy skin care LLE                  OT Short Term Goals - 09/01/16 1356      OT SHORT TERM GOAL #1   Title Lymphedema (LE) management/ self-care: Pt able to apply  multi layered, gradient compression wraps with MAX caregiver assistance using proper techniques within 2 weeks to achieve optimal limb volume reduction.   Baseline max caregiver assist; caregiver min assist   Time 2   Period Weeks   Status New     OT SHORT TERM GOAL #2   Title Lymphedema (LE) management/ self-care:  Pt to achieve at least 10% LLE limb volume reductions bilaterally during Intensive CDT to limit LE progression, decrease infection and falls risk, to reduce pain/, and to improve safe ambulation and functional mobility.   Baseline max assist   Time 12   Period Weeks   Status New     OT SHORT TERM GOAL #3   Title Lymphedema (LE) management/ self-care:  Pt >/= 85 % compliant with all daily, LE self-care protocols for home program w/ needed level of caregiver assistance , including simple self-manual lymphatic drainage (MLD), skin care, lymphatic pumping the ex, skin care, and donning/ doffing compression wraps and garments o limit LE progression and further functional decline.     Baseline Max A   Time 12   Period Weeks   Status New     OT SHORT TERM GOAL #4   Title Lymphedema (LE) management/ self-care:  Pt to tolerate daily compression wraps, garments and devices in keeping w/ prescribed wear regime within 1 week of issue date to progress and retain clinical and functional gains and to limit LE progression.   Baseline mod A   Time 12   Period Weeks   Status New     OT SHORT TERM GOAL #5   Title Lymphedema (LE) self-care:  During Management Phase CDT Pt to sustain limb volume reductions achieved during Intensive Phase CDT within 5% utilizing LE self-care protocols, appropriate compression garments/ devices, and needed level of caregiver assistance.   Baseline Max A   Time 6   Period Months   Status New           OT Long Term Goals - 09/02/15 1205      OT LONG TERM GOAL #1   Title Pt able to correctly apply gradient compression wraps to below knee with moderate  caregiver assistance for optimal limb volume reduction and improvement in tissue integrity.   Baseline dependent    Time 2   Period Weeks   Status Achieved     OT LONG TERM GOAL #2   Title Pt to achieve 15% limb volume reduction in LLE, and 10% reduction in RLE by DC to limit lymphedema (LE) progression and infection risk.- NEW GOAL 25%   Baseline dependent   Time 12   Status Partially Met     OT LONG TERM GOAL #3   Title Pt > 75% compliant with all daily LE self-care protocols w/ moderate caregiver assistance, including simple self-manual lymphatic drainage (MLD), skin care,  lymphatic pumping therex, and donning/ doffing progression garments.   Baseline dependent   Time 12   Period Weeks   Status Partially Met     OT LONG TERM GOAL #6   Title During Management Phase CDT Pt to sustain limb volume reductions achieved during Intensive Phase CDT within 5% utilizing LE self-care protocols, appropriate compression garments/ devices, and moderate caregiver assistance.   Baseline dependent   Time 6   Status On-going               Plan - 10/02/16 1013    Clinical Impression Statement LLE comparative limb volumetrics reveal LLE  limb volume below the knee (A-D) is DEcreased by 15.20%, and overall leg volume (A-G) is DEcreased by 10.42% since last measured on 08/27/16. Initial 10% volumetric reduction is MET w/ these volumes. Pt is tolerating all aspects quite well and is making excellent progresss towards OT goals for CDT. Cont as per POC.   Rehab Potential Good   Clinical Impairments Affecting Rehab Potential see SUBJECTIVE section for list of functional impairments 2/2 BLE LE   OT Frequency 3x / week   OT Duration 12 weeks   OT Treatment/Interventions Self-care/ADL training;Therapeutic exercise;Patient/family education;Manual Therapy;Manual lymph drainage;Therapeutic exercises;DME and/or AE instruction;Compression bandaging;Therapeutic activities;Scar mobilization;Other (comment)   skin care to limit infection risk and improve flexibility/ AROM      Patient will benefit from skilled therapeutic intervention in order to improve the following deficits and impairments:  Abnormal gait, Decreased skin integrity, Decreased knowledge of precautions, Decreased scar mobility, Impaired perceived functional ability, Improper body mechanics, Decreased activity tolerance, Decreased knowledge of use of DME, Impaired flexibility, Decreased balance, Difficulty walking, Obesity, Decreased range of motion, Increased edema, Pain  Visit Diagnosis: Lymphedema, not elsewhere classified    Problem List Patient Active Problem List   Diagnosis Date Noted  . Episodic tension-type headache, not intractable 07/22/2015  . Hydrocephalus, adult 09/10/2014  . Glaucoma 08/22/2014  . Preventative health care 05/18/2011  . Lymphedema 08/06/2006    Andrey Spearman, MS, OTR/L, [facility] 10/02/16 10:18 AM   [place] MAIN [facility] SERVICES [address] [place], [place], [address] Phone: [phone]   Fax:  [phone]  Name: Alison Fox MRN: [id number] Date of Birth: Feb 28, 1971

## 2016-10-05 ENCOUNTER — Ambulatory Visit: Payer: Medicare Other | Admitting: Occupational Therapy

## 2016-10-05 DIAGNOSIS — I890 Lymphedema, not elsewhere classified: Secondary | ICD-10-CM | POA: Diagnosis not present

## 2016-10-05 NOTE — Patient Instructions (Signed)
LE instructions and precautions as established- see initial eval.   

## 2016-10-05 NOTE — Therapy (Signed)
[place] MAIN [facility] SERVICES [address] [place], [place], [address] Phone: [phone]   Fax:  [phone]  Occupational Therapy Treatment  Patient Details  Name: Alison Fox MRN: [id number] Date of Birth: 1970-11-13 No Data Recorded  Encounter Date: 10/05/2016      OT End of Session - 10/05/16 0923    Visit Number 8   Number of Visits 36   Date for OT Re-Evaluation 11/30/16   OT Start Time 0905   OT Stop Time 1015   OT Time Calculation (min) 70 min      Past Medical History:  Diagnosis Date  . Glaucoma 08/22/2014  . Headache   . 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 On 01/21/15, she saw Dr. Kathyrn Sheriff at [facility].  She had a repeat MRI of the brain without contrast which demonstrated symmetric ventriculomegaly of the lateral ventricles and third ventricle without significant transependymal flow.  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  Endoscopic third ventriculostomy was offered but not insisted since her only symptoms are headache, which is now mild.  She did not wish to proceed with this procedure.  . Lymphedema    Chronic following left knee surgery in 2000.  . Varicose veins     Past Surgical History:  Procedure Laterality Date  . KNEE ARTHROSCOPY  2000   Left knee.     There were no vitals filed for this visit.      Subjective Assessment - 10/05/16 0920    Subjective  Pt presnts for OT visit 8 of Intensive Phase CDT to treat BLE lymphedema. Pt is accompanied by her mother today. PPt presents w/ compression wraps in place. Pt has no complaints re self management over the weekend.   Patient is accompained by: Family member   Pertinent History onset in 34s and positive family history for maternal grandmother and cousin suggests primary LE Tarda; arthroscopic knee sx ~  2000?, recent fall w/ head laceration. 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   Limitations difficulty walking, decreased balance, suspected BLE suspected, R>L,     Patient Stated Goals replace my stockings and get the swelling down again.   Currently in Pain? No/denies   Pain Onset More than a month ago                      OT Treatments/Exercises (OP) - 10/05/16 0001      ADLs   ADL Education Given Yes     Manual Therapy   Manual Therapy Edema management;Manual Lymphatic Drainage (MLD);Compression Bandaging   Soft tissue mobilization fibrosis technique at L medial and lateral malleolii, scar massage   Manual Lymphatic Drainage (MLD) Manual lymph drainage (MLD) to LLE in supine utilizing functional inguinal lymph nodes and deep abdominal lymphatics as is customary for non-cancer related lower extremity LE, including bilateral "short neck" sequence, J strokes to sub and supraclavicular LN, deep abdominal pathways, functional inguinal LN, lower extremity proximal to distal w/ emphasis on medial knee bottleneck and politeal LN. Performed fibrosis technique to B maleoli and distal  legs to address fatty fibrosis. Good tolerance.   Compression Bandaging LLE gradient compression wraps applied from base of toes to  below knee as follows:   Other Manual Therapy skin care LLE                OT Education - 10/05/16 0923    Education provided Yes   Education Details Con tinued skilled Pt/caregiver Education  And LE ADL training throughout visit for lymphedema self care, including compression wrapping, compression garment and device wear/care, lymphatic pumping ther ex, simple self-MLD, and skin care. Discussed progress towards goals.          OT Short Term Goals - 09/01/16 1356      OT SHORT  TERM GOAL #1   Title Lymphedema (LE) management/ self-care: Pt able to apply multi layered, gradient compression wraps with MAX caregiver assistance using proper techniques within 2 weeks to achieve optimal limb volume reduction.   Baseline max caregiver assist; caregiver min assist   Time 2   Period Weeks   Status New     OT SHORT TERM GOAL #2   Title Lymphedema (LE) management/ self-care:  Pt to achieve at least 10% LLE limb volume reductions bilaterally during Intensive CDT to limit LE progression, decrease infection and falls risk, to reduce pain/, and to improve safe ambulation and functional mobility.   Baseline max assist   Time 12   Period Weeks   Status New     OT SHORT TERM GOAL #3   Title Lymphedema (LE) management/ self-care:  Pt >/= 85 % compliant with all daily, LE self-care protocols for home program w/ needed level of caregiver assistance , including simple self-manual lymphatic drainage (MLD), skin care, lymphatic pumping the ex, skin care, and donning/ doffing compression wraps and garments o limit LE progression and further functional decline.     Baseline Max A   Time 12   Period Weeks   Status New     OT SHORT TERM GOAL #4   Title Lymphedema (LE) management/ self-care:  Pt to tolerate daily compression wraps, garments and devices in keeping w/ prescribed wear regime within 1 week of issue date to progress and retain clinical and functional gains and to limit LE progression.   Baseline mod A   Time 12   Period Weeks   Status New     OT SHORT TERM GOAL #5   Title Lymphedema (LE) self-care:  During Management Phase CDT Pt to sustain limb volume reductions achieved during Intensive Phase CDT within 5% utilizing LE self-care protocols, appropriate compression garments/ devices, and needed level of caregiver assistance.   Baseline Max A   Time 6   Period Months   Status New           OT Long Term Goals - 09/02/15 1205      OT LONG TERM GOAL #1   Title Pt able  to correctly apply gradient compression wraps to below knee with moderate caregiver assistance for optimal limb volume reduction and improvement in tissue integrity.   Baseline dependent    Time 2   Period Weeks   Status Achieved     OT LONG TERM GOAL #2   Title Pt to achieve 15% limb volume reduction in LLE, and 10% reduction in RLE by DC to limit lymphedema (LE) progression and infection risk.- NEW GOAL 25%   Baseline dependent   Time 12   Status Partially Met     OT LONG TERM GOAL #3   Title Pt > 75% compliant with all daily LE self-care protocols w/ moderate caregiver assistance, including simple self-manual lymphatic  drainage (MLD), skin care, lymphatic pumping therex, and donning/ doffing progression garments.   Baseline dependent   Time 12   Period Weeks   Status Partially Met     OT LONG TERM GOAL #6   Title During Management Phase CDT Pt to sustain limb volume reductions achieved during Intensive Phase CDT within 5% utilizing LE self-care protocols, appropriate compression garments/ devices, and moderate caregiver assistance.   Baseline dependent   Time 6   Status On-going               Plan - 10/05/16 1011    Clinical Impression Statement LLE condition continues to improve. Limb volume and tissue integrity presenting with slowly but steadily decreasing symptoms. Pt working hard to practice self MLD and skin care during session today without prompts. Cont as per POC. OT to send demographics and garment recommendations to DME vendor later today.   Rehab Potential Good   Clinical Impairments Affecting Rehab Potential see SUBJECTIVE section for list of functional impairments 2/2 BLE LE   OT Frequency 3x / week   OT Duration 12 weeks   OT Treatment/Interventions Self-care/ADL training;Therapeutic exercise;Patient/family education;Manual Therapy;Manual lymph drainage;Therapeutic exercises;DME and/or AE instruction;Compression bandaging;Therapeutic activities;Scar  mobilization;Other (comment)  skin care to limit infection risk and improve flexibility/ AROM      Patient will benefit from skilled therapeutic intervention in order to improve the following deficits and impairments:  Abnormal gait, Decreased skin integrity, Decreased knowledge of precautions, Decreased scar mobility, Impaired perceived functional ability, Improper body mechanics, Decreased activity tolerance, Decreased knowledge of use of DME, Impaired flexibility, Decreased balance, Difficulty walking, Obesity, Decreased range of motion, Increased edema, Pain  Visit Diagnosis: Lymphedema, not elsewhere classified    Problem List Patient Active Problem List   Diagnosis Date Noted  . Episodic tension-type headache, not intractable 07/22/2015  . Hydrocephalus, adult 09/10/2014  . Glaucoma 08/22/2014  . Preventative health care 05/18/2011  . Lymphedema 08/06/2006    Andrey Spearman, MS, OTR/L, [facility] 10/05/16 10:22 AM  [place] MAIN [facility] SERVICES [address] [place], [place], [address] Phone: [phone]   Fax:  [phone]  Name: Alison Fox MRN: [id number] Date of Birth: 1971-07-25

## 2016-10-07 ENCOUNTER — Ambulatory Visit: Payer: Medicare Other | Admitting: Occupational Therapy

## 2016-10-07 DIAGNOSIS — I890 Lymphedema, not elsewhere classified: Secondary | ICD-10-CM

## 2016-10-08 NOTE — Therapy (Signed)
[place] MAIN [facility] SERVICES [address] [place], [place], [address] Phone: [phone]   Fax:  [phone]  Occupational Therapy Treatment  Patient Details  Name: Alison Fox MRN: [id number] Date of Birth: 19-Dec-1970 No Data Recorded  Encounter Date: 10/07/2016      OT End of Session - 10/08/16 0954    Visit Number 9   Number of Visits 36   Date for OT Re-Evaluation 11/30/16   OT Start Time 0909   OT Stop Time 1006   OT Time Calculation (min) 57 min      Past Medical History:  Diagnosis Date  . Glaucoma 08/22/2014  . Headache   . 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 On 01/21/15, she saw Dr. Kathyrn Sheriff at [facility].  She had a repeat MRI of the brain without contrast which demonstrated symmetric ventriculomegaly of the lateral ventricles and third ventricle without significant transependymal flow.  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  Endoscopic third ventriculostomy was offered but not insisted since her only symptoms are headache, which is now mild.  She did not wish to proceed with this procedure.  . Lymphedema    Chronic following left knee surgery in 2000.  . Varicose veins     Past Surgical History:  Procedure Laterality Date  . KNEE ARTHROSCOPY  2000   Left knee.     There were no vitals filed for this visit.      Subjective Assessment - 10/07/16 0951    Subjective  Pt presnts for OT visit 9 of Intensive Phase CDT to treat BLE lymphedema. Pt is accompanied by her mother today. Pt denies leg pain today.   Patient is accompained by: Family member   Pertinent History onset in 79s and positive family history for maternal grandmother and cousin suggests primary LE Tarda; arthroscopic knee sx ~ 2000?, recent fall w/ head laceration. 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 Completed previous Intensive CDT achieving a remarkable 32.07% limb volume reduction below the knee on the LLE , and a 7.24% limb volume reduction on the R.   Limitations difficulty walking, decreased balance, suspected BLE suspected, R>L,     Patient Stated Goals replace my stockings and get the swelling down again.   Currently in Pain? No/denies   Pain Onset More than a month ago                      OT Treatments/Exercises (OP) - 10/08/16 0001      ADLs   ADL Education Given Yes     Manual Therapy   Manual Therapy Edema management;Manual Lymphatic Drainage (MLD);Compression Bandaging   Soft tissue mobilization fibrosis technique at L medial and lateral malleolii, scar massage   Manual Lymphatic Drainage (MLD) Manual lymph drainage (MLD) to LLE in supine utilizing functional inguinal lymph nodes and deep abdominal lymphatics as is customary for non-cancer related lower extremity LE, including bilateral "short neck" sequence, J strokes to sub and supraclavicular LN, deep abdominal pathways, functional inguinal LN, lower extremity proximal to distal w/ emphasis on medial knee bottleneck and politeal LN. Performed fibrosis technique to B maleoli and distal  legs to address fatty fibrosis. Good tolerance.   Compression Bandaging LLE gradient compression wraps applied from base of toes to below knee as follows:   Other Manual Therapy skin care LLE  OT Education - 10/08/16 [phone]    Education provided Yes   Education Details Con tinued skilled Pt/caregiver Education  And LE ADL training throughout visit for lymphedema self care, including compression wrapping, compression garment and device wear/care, lymphatic pumping ther ex, simple self-MLD, and skin care. Discussed progress towards goals.   Person(s) Educated Patient;Parent(s)   Methods Explanation;Demonstration   Comprehension Verbalized understanding;Need further instruction           OT Short Term Goals - 09/01/16 1356      OT SHORT TERM GOAL #1   Title Lymphedema (LE) management/ self-care: Pt able to apply multi layered, gradient compression wraps with MAX caregiver assistance using proper techniques within 2 weeks to achieve optimal limb volume reduction.   Baseline max caregiver assist; caregiver min assist   Time 2   Period Weeks   Status New     OT SHORT TERM GOAL #2   Title Lymphedema (LE) management/ self-care:  Pt to achieve at least 10% LLE limb volume reductions bilaterally during Intensive CDT to limit LE progression, decrease infection and falls risk, to reduce pain/, and to improve safe ambulation and functional mobility.   Baseline max assist   Time 12   Period Weeks   Status New     OT SHORT TERM GOAL #3   Title Lymphedema (LE) management/ self-care:  Pt >/= 85 % compliant with all daily, LE self-care protocols for home program w/ needed level of caregiver assistance , including simple self-manual lymphatic drainage (MLD), skin care, lymphatic pumping the ex, skin care, and donning/ doffing compression wraps and garments o limit LE progression and further functional decline.     Baseline Max A   Time 12   Period Weeks   Status New     OT SHORT TERM GOAL #4   Title Lymphedema (LE) management/ self-care:  Pt to tolerate daily compression wraps, garments and devices in keeping w/ prescribed wear regime within 1 week of issue date to progress and retain clinical and functional gains and to limit LE progression.   Baseline mod A   Time 12   Period Weeks   Status New     OT SHORT TERM GOAL #5   Title Lymphedema (LE) self-care:  During Management Phase CDT Pt to sustain limb volume reductions achieved during Intensive Phase CDT within 5% utilizing LE self-care protocols, appropriate compression garments/ devices, and needed level of caregiver assistance.   Baseline Max A   Time 6   Period Months   Status New           OT Long Term  Goals - 09/02/15 1205      OT LONG TERM GOAL #1   Title Pt able to correctly apply gradient compression wraps to below knee with moderate caregiver assistance for optimal limb volume reduction and improvement in tissue integrity.   Baseline dependent    Time 2   Period Weeks   Status Achieved     OT LONG TERM GOAL #2   Title Pt to achieve 15% limb volume reduction in LLE, and 10% reduction in RLE by DC to limit lymphedema (LE) progression and infection risk.- NEW GOAL 25%   Baseline dependent   Time 12   Status Partially Met     OT LONG TERM GOAL #3   Title Pt > 75% compliant with all daily LE self-care protocols w/ moderate caregiver assistance, including simple self-manual lymphatic drainage (MLD), skin care, lymphatic pumping therex, and donning/ doffing progression  garments.   Baseline dependent   Time 12   Period Weeks   Status Partially Met     OT LONG TERM GOAL #6   Title During Management Phase CDT Pt to sustain limb volume reductions achieved during Intensive Phase CDT within 5% utilizing LE self-care protocols, appropriate compression garments/ devices, and moderate caregiver assistance.   Baseline dependent   Time 6   Status On-going               Plan - 10/08/16 0954    Clinical Impression Statement Pt continues to demonstrate progress towards all OT goals. Pt spontaneously performed simple self MLD on lower leg this visit without prompting. She uses good technique without demonstration or verbal cues. . Cont as per POC.   Rehab Potential Good   Clinical Impairments Affecting Rehab Potential see SUBJECTIVE section for list of functional impairments 2/2 BLE LE   OT Frequency 3x / week   OT Duration 12 weeks   OT Treatment/Interventions Self-care/ADL training;Therapeutic exercise;Patient/family education;Manual Therapy;Manual lymph drainage;Therapeutic exercises;DME and/or AE instruction;Compression bandaging;Therapeutic activities;Scar mobilization;Other  (comment)  skin care to limit infection risk and improve flexibility/ AROM      Patient will benefit from skilled therapeutic intervention in order to improve the following deficits and impairments:  Abnormal gait, Decreased skin integrity, Decreased knowledge of precautions, Decreased scar mobility, Impaired perceived functional ability, Improper body mechanics, Decreased activity tolerance, Decreased knowledge of use of DME, Impaired flexibility, Decreased balance, Difficulty walking, Obesity, Decreased range of motion, Increased edema, Pain  Visit Diagnosis: Lymphedema, not elsewhere classified    Problem List Patient Active Problem List   Diagnosis Date Noted  . Episodic tension-type headache, not intractable 07/22/2015  . Hydrocephalus, adult 09/10/2014  . Glaucoma 08/22/2014  . Preventative health care 05/18/2011  . Lymphedema 08/06/2006    Andrey Spearman, MS, OTR/L, [facility] 10/08/16 9:57 AM  [place] MAIN [facility] SERVICES [address] [place], [place], [address] Phone: [phone]   Fax:  [phone]  Name: Alison Fox MRN: [id number] Date of Birth: 1971/05/26

## 2016-10-08 NOTE — Patient Instructions (Signed)
LE instructions and precautions as established- see initial eval.   

## 2016-10-09 ENCOUNTER — Ambulatory Visit: Payer: Medicare Other | Admitting: Occupational Therapy

## 2016-10-09 DIAGNOSIS — I890 Lymphedema, not elsewhere classified: Secondary | ICD-10-CM

## 2016-10-09 NOTE — Therapy (Signed)
[place] MAIN [facility] SERVICES [address] [place], [place], [address] Phone: [phone]   Fax:  [phone]  Occupational Therapy Treatment & Progress Note  Patient Details  Name: Alison Fox MRN: [id number] Date of Birth: May 16, 1971 No Data Recorded  Encounter Date: 10/09/2016      OT End of Session - 10/09/16 1227    Visit Number 10   Number of Visits 36   Date for OT Re-Evaluation 11/30/16   OT Start Time 0906   OT Stop Time 1003   OT Time Calculation (min) 57 min      Past Medical History:  Diagnosis Date  . Glaucoma 08/22/2014  . Headache   . 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 On 01/21/15, she saw Dr. Kathyrn Sheriff at [facility].  She had a repeat MRI of the brain without contrast which demonstrated symmetric ventriculomegaly of the lateral ventricles and third ventricle without significant transependymal flow.  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  Endoscopic third ventriculostomy was offered but not insisted since her only symptoms are headache, which is now mild.  She did not wish to proceed with this procedure.  . Lymphedema    Chronic following left knee surgery in 2000.  . Varicose veins     Past Surgical History:  Procedure Laterality Date  . KNEE ARTHROSCOPY  2000   Left knee.     There were no vitals filed for this visit.      Subjective Assessment - 10/09/16 1225    Subjective  Pt presnts for OT visit 10 of Intensive Phase CDT to treat BLE lymphedema. Pt is accompanied by her mother today. Pt denies leg pain today. Pt has no new complaints.   Patient is accompained by: Family member   Pertinent History onset in 41s and positive family history for maternal grandmother and cousin suggests primary LE Tarda; arthroscopic knee sx ~ 2000?, recent fall w/ head  laceration. 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   Limitations difficulty walking, decreased balance, suspected BLE suspected, R>L,     Patient Stated Goals replace my stockings and get the swelling down again.   Currently in Pain? No/denies   Pain Onset More than a month ago                      OT Treatments/Exercises (OP) - 10/09/16 0001      ADLs   ADL Education Given Yes     Manual Therapy   Manual Therapy Edema management;Manual Lymphatic Drainage (MLD);Compression Bandaging   Soft tissue mobilization fibrosis technique at L medial and lateral malleolii, scar massage   Manual Lymphatic Drainage (MLD) Manual lymph drainage (MLD) to LLE in supine utilizing functional inguinal lymph nodes and deep abdominal lymphatics as is customary for non-cancer related lower extremity LE, including bilateral "short neck" sequence, J strokes to sub and supraclavicular LN, deep abdominal pathways, functional inguinal LN, lower extremity proximal to distal w/ emphasis on medial knee bottleneck and politeal LN. Performed fibrosis technique to B maleoli and distal  legs to address fatty fibrosis. Good tolerance.   Compression Bandaging LLE gradient compression wraps applied from base of toes to below knee as follows:  Other Manual Therapy skin care LLE                OT Education - 10/09/16 1226    Education provided Yes   Education Details Con tinued skilled Pt/caregiver Education  And LE ADL training throughout visit for lymphedema self care, including compression wrapping, compression garment and device wear/care, lymphatic pumping ther ex, simple self-MLD, and skin care. Discussed progress towards goals.   Person(s) Educated Patient;Parent(s)   Methods Explanation;Demonstration   Comprehension  Verbalized understanding;Need further instruction          OT Short Term Goals - 09/01/16 1356      OT SHORT TERM GOAL #1   Title Lymphedema (LE) management/ self-care: Pt able to apply multi layered, gradient compression wraps with MAX caregiver assistance using proper techniques within 2 weeks to achieve optimal limb volume reduction.   Baseline max caregiver assist; caregiver min assist   Time 2   Period Weeks   Status New     OT SHORT TERM GOAL #2   Title Lymphedema (LE) management/ self-care:  Pt to achieve at least 10% LLE limb volume reductions bilaterally during Intensive CDT to limit LE progression, decrease infection and falls risk, to reduce pain/, and to improve safe ambulation and functional mobility.   Baseline max assist   Time 12   Period Weeks   Status New     OT SHORT TERM GOAL #3   Title Lymphedema (LE) management/ self-care:  Pt >/= 85 % compliant with all daily, LE self-care protocols for home program w/ needed level of caregiver assistance , including simple self-manual lymphatic drainage (MLD), skin care, lymphatic pumping the ex, skin care, and donning/ doffing compression wraps and garments o limit LE progression and further functional decline.     Baseline Max A   Time 12   Period Weeks   Status New     OT SHORT TERM GOAL #4   Title Lymphedema (LE) management/ self-care:  Pt to tolerate daily compression wraps, garments and devices in keeping w/ prescribed wear regime within 1 week of issue date to progress and retain clinical and functional gains and to limit LE progression.   Baseline mod A   Time 12   Period Weeks   Status New     OT SHORT TERM GOAL #5   Title Lymphedema (LE) self-care:  During Management Phase CDT Pt to sustain limb volume reductions achieved during Intensive Phase CDT within 5% utilizing LE self-care protocols, appropriate compression garments/ devices, and needed level of caregiver assistance.   Baseline Max A   Time 6   Period  Months   Status New           OT Long Term Goals - 10/09/16 1227      OT LONG TERM GOAL #1   Title Pt able to correctly apply gradient compression wraps to below knee with moderate caregiver assistance for optimal limb volume reduction and improvement in tissue integrity.   Baseline dependent    Time 2   Period Weeks   Status Achieved     OT LONG TERM GOAL #2   Title Pt to achieve 15% limb volume reduction in LLE, and 10% reduction in RLE by DC to limit lymphedema (LE) progression and infection risk.- NEW GOAL 25%   Baseline dependent   Time 12   Status Partially Met     OT LONG TERM GOAL #3   Title Pt > 75% compliant with all daily LE  self-care protocols w/ moderate caregiver assistance, including simple self-manual lymphatic drainage (MLD), skin care, lymphatic pumping therex, and donning/ doffing progression garments.   Baseline dependent   Time 12   Period Weeks   Status Achieved     OT LONG TERM GOAL #5   Title Pt to remain infection free throughout CDT course to limit infection and LE progression.   Baseline dependent   Time 12   Period Weeks   Status On-going     OT LONG TERM GOAL #6   Title During Management Phase CDT Pt to sustain limb volume reductions achieved during Intensive Phase CDT within 5% utilizing LE self-care protocols, appropriate compression garments/ devices, and moderate caregiver assistance.   Baseline dependent   Time 6   Status On-going               Plan - 10-28-2016 1228    Clinical Impression Statement Pt demonstrates ongong progress towards all LE self care goals. Improved compliance is noted  during clinic visits and at home. LLE limb volume and tissue integrity are both much improved. Pt has met 10% limb volume reduction goal and that goal was increased to 15% for LLE. DME vendor was sent replacem,ent custom compression garment ad device specifications and Pt is awaiting measurement and fitting. Cont as per POC.   Rehab Potential  Good   Clinical Impairments Affecting Rehab Potential see SUBJECTIVE section for list of functional impairments 2/2 BLE LE   OT Frequency 3x / week   OT Duration 12 weeks   OT Treatment/Interventions Self-care/ADL training;Therapeutic exercise;Patient/family education;Manual Therapy;Manual lymph drainage;Therapeutic exercises;DME and/or AE instruction;Compression bandaging;Therapeutic activities;Scar mobilization;Other (comment)  skin care to limit infection risk and improve flexibility/ AROM      Patient will benefit from skilled therapeutic intervention in order to improve the following deficits and impairments:  Abnormal gait, Decreased skin integrity, Decreased knowledge of precautions, Decreased scar mobility, Impaired perceived functional ability, Improper body mechanics, Decreased activity tolerance, Decreased knowledge of use of DME, Impaired flexibility, Decreased balance, Difficulty walking, Obesity, Decreased range of motion, Increased edema, Pain  Visit Diagnosis: Lymphedema, not elsewhere classified      G-Codes - 2016/10/28 1231    Functional Assessment Tool Used anatomical and volumetric measurements, clinical judgement, interview, medical hx   Functional Limitation Self care   Self Care Current Status (S3159) At least 40 percent but less than 60 percent impaired, limited or restricted   Self Care Goal Status (Y5859) At least 20 percent but less than 40 percent impaired, limited or restricted      Problem List Patient Active Problem List   Diagnosis Date Noted  . Episodic tension-type headache, not intractable 07/22/2015  . Hydrocephalus, adult 09/10/2014  . Glaucoma 08/22/2014  . Preventative health care 05/18/2011  . Lymphedema 08/06/2006    Con tinued skilled Pt/caregiver Education  And LE ADL training throughout visit for lymphedema self care, including compression wrapping, compression garment and device wear/care, lymphatic pumping ther ex, simple self-MLD, and  skin care. Discussed progress towards goals.  [place] MAIN [facility] SERVICES [address] [place], [place], [address] Phone: [phone]   Fax:  [phone]  Name: Alison Fox MRN: [id number] Date of Birth: [birth year]

## 2016-10-09 NOTE — Patient Instructions (Signed)
LE instructions and precautions as established- see initial eval.   

## 2016-10-14 ENCOUNTER — Ambulatory Visit: Payer: Medicare Other | Admitting: Occupational Therapy

## 2016-10-14 DIAGNOSIS — I890 Lymphedema, not elsewhere classified: Secondary | ICD-10-CM

## 2016-10-14 NOTE — Therapy (Signed)
[place] MAIN [facility] SERVICES [address] [place], [place], [address] Phone: [phone]   Fax:  [phone]  Occupational Therapy Treatment  Patient Details  Name: Alison Fox MRN: [id number] Date of Birth: Jul 16, 1971 No Data Recorded  Encounter Date: 10/14/2016      OT End of Session - 10/14/16 1035    Visit Number 11   Number of Visits 36   Date for OT Re-Evaluation 11/30/16   OT Start Time 0904   OT Stop Time 1009   OT Time Calculation (min) 65 min      Past Medical History:  Diagnosis Date  . Glaucoma 08/22/2014  . Headache   . 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 On 01/21/15, she saw Dr. Kathyrn Sheriff at [facility].  She had a repeat MRI of the brain without contrast which demonstrated symmetric ventriculomegaly of the lateral ventricles and third ventricle without significant transependymal flow.  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  Endoscopic third ventriculostomy was offered but not insisted since her only symptoms are headache, which is now mild.  She did not wish to proceed with this procedure.  . Lymphedema    Chronic following left knee surgery in 2000.  . Varicose veins     Past Surgical History:  Procedure Laterality Date  . KNEE ARTHROSCOPY  2000   Left knee.     There were no vitals filed for this visit.      Subjective Assessment - 10/14/16 0918    Subjective  Pt presnts for OT visit 11 of Intensive Phase CDT to treat BLE lymphedema. Pt is accompanied by her mother. Pt reports she was able to managed LE self care over the holiday break "pretty well" w/ her mother's help. Pt denies new complaints.   Patient is accompained by: Family member   Pertinent History onset in 66s and positive family history for maternal grandmother and cousin suggests primary LE Tarda;  arthroscopic knee sx ~ 2000?, recent fall w/ head laceration. 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   Limitations difficulty walking, decreased balance, suspected BLE suspected, R>L,     Patient Stated Goals replace my stockings and get the swelling down again.   Currently in Pain? No/denies   Pain Onset More than a month ago   Pain Frequency Intermittent                      OT Treatments/Exercises (OP) - 10/14/16 0001      ADLs   ADL Education Given Yes     Manual Therapy   Manual Therapy Edema management;Manual Lymphatic Drainage (MLD);Compression Bandaging   Soft tissue mobilization fibrosis technique at L medial and lateral malleolii, scar massage   Manual Lymphatic Drainage (MLD) Manual lymph drainage (MLD) to LLE in supine utilizing functional inguinal lymph nodes and deep abdominal lymphatics as is customary for non-cancer related lower extremity LE, including bilateral "short neck" sequence, J strokes to sub and supraclavicular LN, deep abdominal pathways, functional inguinal LN, lower extremity proximal to distal w/ emphasis on medial knee bottleneck and politeal LN. Performed fibrosis technique to B maleoli and distal  legs to address fatty fibrosis. Good tolerance.   Compression  Bandaging LLE gradient compression wraps applied circumferentially in gradient configuration from toes to groin as follows: custom toe wrap using 1 and 2" co-wrap under cotton stockinett from toes to groin; 8 cm x 1 to foot and ankle, 10 cm x 1, then 12 cm x 2- all layered over .04 x 10 cm and 12 cm Rosidol Soft foam from A-G.Placed 1 dense foam kidney, fabricated in clinic today. Behind and slightly below both left maleolii in effort to increase compression to fatty fibrosis in this area.   Other Manual Therapy  skin care LLE                OT Education - 10/14/16 1034    Education provided Yes   Education Details Con tinued skilled Pt/caregiver Education  And LE ADL training throughout visit for lymphedema self care, including compression wrapping, compression garment and device wear/care, lymphatic pumping ther ex, simple self-MLD, and skin care. Discussed progress towards goals.   Person(s) Educated Patient;Parent(s)   Methods Explanation;Demonstration   Comprehension Verbalized understanding;Need further instruction          OT Short Term Goals - 09/01/16 1356      OT SHORT TERM GOAL #1   Title Lymphedema (LE) management/ self-care: Pt able to apply multi layered, gradient compression wraps with MAX caregiver assistance using proper techniques within 2 weeks to achieve optimal limb volume reduction.   Baseline max caregiver assist; caregiver min assist   Time 2   Period Weeks   Status New     OT SHORT TERM GOAL #2   Title Lymphedema (LE) management/ self-care:  Pt to achieve at least 10% LLE limb volume reductions bilaterally during Intensive CDT to limit LE progression, decrease infection and falls risk, to reduce pain/, and to improve safe ambulation and functional mobility.   Baseline max assist   Time 12   Period Weeks   Status New     OT SHORT TERM GOAL #3   Title Lymphedema (LE) management/ self-care:  Pt >/= 85 % compliant with all daily, LE self-care protocols for home program w/ needed level of caregiver assistance , including simple self-manual lymphatic drainage (MLD), skin care, lymphatic pumping the ex, skin care, and donning/ doffing compression wraps and garments o limit LE progression and further functional decline.     Baseline Max A   Time 12   Period Weeks   Status New     OT SHORT TERM GOAL #4   Title Lymphedema (LE) management/ self-care:  Pt to tolerate daily compression wraps, garments and devices in keeping w/ prescribed wear regime within 1 week of  issue date to progress and retain clinical and functional gains and to limit LE progression.   Baseline mod A   Time 12   Period Weeks   Status New     OT SHORT TERM GOAL #5   Title Lymphedema (LE) self-care:  During Management Phase CDT Pt to sustain limb volume reductions achieved during Intensive Phase CDT within 5% utilizing LE self-care protocols, appropriate compression garments/ devices, and needed level of caregiver assistance.   Baseline Max A   Time 6   Period Months   Status New           OT Long Term Goals - 10/09/16 1227      OT LONG TERM GOAL #1   Title Pt able to correctly apply gradient compression wraps to below knee with moderate caregiver assistance for optimal limb volume reduction and improvement in  tissue integrity.   Baseline dependent    Time 2   Period Weeks   Status Achieved     OT LONG TERM GOAL #2   Title Pt to achieve 15% limb volume reduction in LLE, and 10% reduction in RLE by DC to limit lymphedema (LE) progression and infection risk.- NEW GOAL 25%   Baseline dependent   Time 12   Status Partially Met     OT LONG TERM GOAL #3   Title Pt > 75% compliant with all daily LE self-care protocols w/ moderate caregiver assistance, including simple self-manual lymphatic drainage (MLD), skin care, lymphatic pumping therex, and donning/ doffing progression garments.   Baseline dependent   Time 12   Period Weeks   Status Achieved     OT LONG TERM GOAL #5   Title Pt to remain infection free throughout CDT course to limit infection and LE progression.   Baseline dependent   Time 12   Period Weeks   Status On-going     OT LONG TERM GOAL #6   Title During Management Phase CDT Pt to sustain limb volume reductions achieved during Intensive Phase CDT within 5% utilizing LE self-care protocols, appropriate compression garments/ devices, and moderate caregiver assistance.   Baseline dependent   Time 6   Status On-going               Plan -  10/14/16 1035    Clinical Impression Statement Pt continues to actively engage and participate in all aspects of clinical LE care. She and mother perform LE self care routines at home, including skin care, compression wraps daily to LLE, and Pt performs simplified self-MLD intermittently. LLE continues to decongest and skin is 80% more mobile during MLD. Cont as per POC. Hopefully garment vendor will complete anatomical measurements ASAP.   Rehab Potential Good   Clinical Impairments Affecting Rehab Potential see SUBJECTIVE section for list of functional impairments 2/2 BLE LE   OT Frequency 3x / week   OT Duration 12 weeks   OT Treatment/Interventions Self-care/ADL training;Therapeutic exercise;Patient/family education;Manual Therapy;Manual lymph drainage;Therapeutic exercises;DME and/or AE instruction;Compression bandaging;Therapeutic activities;Scar mobilization;Other (comment)  skin care to limit infection risk and improve flexibility/ AROM      Patient will benefit from skilled therapeutic intervention in order to improve the following deficits and impairments:  Abnormal gait, Decreased skin integrity, Decreased knowledge of precautions, Decreased scar mobility, Impaired perceived functional ability, Improper body mechanics, Decreased activity tolerance, Decreased knowledge of use of DME, Impaired flexibility, Decreased balance, Difficulty walking, Obesity, Decreased range of motion, Increased edema, Pain  Visit Diagnosis: Lymphedema, not elsewhere classified    Problem List Patient Active Problem List   Diagnosis Date Noted  . Episodic tension-type headache, not intractable 07/22/2015  . Hydrocephalus, adult 09/10/2014  . Glaucoma 08/22/2014  . Preventative health care 05/18/2011  . Lymphedema 08/06/2006    Andrey Spearman, MS, OTR/L, [facility] 10/14/16 10:41 AM  [place] MAIN [facility] SERVICES [address] [place], [place], [address] Phone:  [phone]   Fax:  [phone]  Name: Alison Fox MRN: [id number] Date of Birth: September 13, 1971

## 2016-10-14 NOTE — Patient Instructions (Signed)
LE instructions and precautions as established- see initial eval.   

## 2016-10-16 ENCOUNTER — Ambulatory Visit: Payer: Medicare Other | Admitting: Occupational Therapy

## 2016-10-16 DIAGNOSIS — I890 Lymphedema, not elsewhere classified: Secondary | ICD-10-CM

## 2016-10-16 NOTE — Therapy (Signed)
[place] MAIN [facility] SERVICES [address] [place], [place], [address] Phone: [phone]   Fax:  [phone]  Occupational Therapy Treatment  Patient Details  Name: Alison Fox MRN: [id number] Date of Birth: September 26, 1971 No Data Recorded  Encounter Date: 10/16/2016      OT End of Session - 10/16/16 1446    Visit Number 12   Number of Visits 36   Date for OT Re-Evaluation 11/30/16   OT Start Time 0915   OT Stop Time 1010   OT Time Calculation (min) 55 min      Past Medical History:  Diagnosis Date  . Glaucoma 08/22/2014  . Headache   . 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 On 01/21/15, she saw Dr. Kathyrn Sheriff at [facility].  She had a repeat MRI of the brain without contrast which demonstrated symmetric ventriculomegaly of the lateral ventricles and third ventricle without significant transependymal flow.  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  Endoscopic third ventriculostomy was offered but not insisted since her only symptoms are headache, which is now mild.  She did not wish to proceed with this procedure.  . Lymphedema    Chronic following left knee surgery in 2000.  . Varicose veins     Past Surgical History:  Procedure Laterality Date  . KNEE ARTHROSCOPY  2000   Left knee.     There were no vitals filed for this visit.      Subjective Assessment - 10/16/16 0918    Subjective  Pt presnts for OT visit 12 of Intensive Phase CDT to treat BLE lymphedema. Pt is accompanied by her mother. Pt has no new complaints today.   Patient is accompained by: Family member   Pertinent History onset in 59s and positive family history for maternal grandmother and cousin suggests primary LE Tarda; arthroscopic knee sx ~ 2000?, recent fall w/ head laceration. 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 Completed previous Intensive CDT achieving a remarkable 32.07% limb volume reduction below the knee on the LLE , and a 7.24% limb volume reduction on the R.   Limitations difficulty walking, decreased balance, suspected BLE suspected, R>L,     Patient Stated Goals replace my stockings and get the swelling down again.   Currently in Pain? Yes   Pain Location Toe (Comment which one)   Pain Orientation Left   Pain Descriptors / Indicators Sore   Pain Onset More than a month ago             LYMPHEDEMA/ONCOLOGY QUESTIONNAIRE - 10/16/16 1441      Left Lower Extremity Lymphedema   Other LLE ankle to knee (A-D) volume= 5032.85 ml. LLE ankle-groin volume= 9698.89 ml   Other LLE A-D volume DEcreased by additional 15.2% since last measured on 10/02/16, and by 27.93% overall since commencing Intensive CDT on 08/27/16, LLE ankle to groin (A-G) limb volume is DEcreased by an additional 15.17% since last measured on 10/02/16. Overall A-G volume is decreased by 23.01% since commencing CDT..                 OT Treatments/Exercises (OP) - 10/16/16 0001      ADLs   ADL Education Given Yes     Manual Therapy   Manual Therapy Edema management;Manual Lymphatic Drainage (MLD);Compression Bandaging   Edema Management LLE comparative limb volumetrics   Soft  tissue mobilization fibrosis technique at L medial and lateral malleolii, scar massage   Manual Lymphatic Drainage (MLD) Manual lymph drainage (MLD) to LLE in supine utilizing functional inguinal lymph nodes and deep abdominal lymphatics as is customary for non-cancer related lower extremity LE, including bilateral "short neck" sequence, J strokes to sub and supraclavicular LN, deep abdominal pathways, functional inguinal LN, lower extremity proximal to distal w/ emphasis on medial knee bottleneck and politeal LN. Performed fibrosis technique to B maleoli and distal  legs to address fatty fibrosis. Good tolerance.    Compression Bandaging LLE gradient compression wraps applied circumferentially in gradient configuration from toes to groin as follows: custom toe wrap using 1 and 2" co-wrap under cotton stockinett from toes to groin; 8 cm x 1 to foot and ankle, 10 cm x 1, then 12 cm x 2- all layered over .04 x 10 cm and 12 cm Rosidol Soft foam from A-G.Placed 1 dense foam kidney, fabricated in clinic today. Behind and slightly below both left maleolii in effort to increase compression to fatty fibrosis in this area.   Other Manual Therapy skin care LLE                OT Education - 10/16/16 0919    Education provided Yes   Education Details Continues LE se;f care training as established and edu for progress towards goals.          OT Short Term Goals - 09/01/16 1356      OT SHORT TERM GOAL #1   Title Lymphedema (LE) management/ self-care: Pt able to apply multi layered, gradient compression wraps with MAX caregiver assistance using proper techniques within 2 weeks to achieve optimal limb volume reduction.   Baseline max caregiver assist; caregiver min assist   Time 2   Period Weeks   Status New     OT SHORT TERM GOAL #2   Title Lymphedema (LE) management/ self-care:  Pt to achieve at least 10% LLE limb volume reductions bilaterally during Intensive CDT to limit LE progression, decrease infection and falls risk, to reduce pain/, and to improve safe ambulation and functional mobility.   Baseline max assist   Time 12   Period Weeks   Status New     OT SHORT TERM GOAL #3   Title Lymphedema (LE) management/ self-care:  Pt >/= 85 % compliant with all daily, LE self-care protocols for home program w/ needed level of caregiver assistance , including simple self-manual lymphatic drainage (MLD), skin care, lymphatic pumping the ex, skin care, and donning/ doffing compression wraps and garments o limit LE progression and further functional decline.     Baseline Max A   Time 12   Period Weeks    Status New     OT SHORT TERM GOAL #4   Title Lymphedema (LE) management/ self-care:  Pt to tolerate daily compression wraps, garments and devices in keeping w/ prescribed wear regime within 1 week of issue date to progress and retain clinical and functional gains and to limit LE progression.   Baseline mod A   Time 12   Period Weeks   Status New     OT SHORT TERM GOAL #5   Title Lymphedema (LE) self-care:  During Management Phase CDT Pt to sustain limb volume reductions achieved during Intensive Phase CDT within 5% utilizing LE self-care protocols, appropriate compression garments/ devices, and needed level of caregiver assistance.   Baseline Max A   Time 6   Period Months  Status New           OT Long Term Goals - 10/09/16 1227      OT LONG TERM GOAL #1   Title Pt able to correctly apply gradient compression wraps to below knee with moderate caregiver assistance for optimal limb volume reduction and improvement in tissue integrity.   Baseline dependent    Time 2   Period Weeks   Status Achieved     OT LONG TERM GOAL #2   Title Pt to achieve 15% limb volume reduction in LLE, and 10% reduction in RLE by DC to limit lymphedema (LE) progression and infection risk.- NEW GOAL 25%   Baseline dependent   Time 12   Status Partially Met     OT LONG TERM GOAL #3   Title Pt > 75% compliant with all daily LE self-care protocols w/ moderate caregiver assistance, including simple self-manual lymphatic drainage (MLD), skin care, lymphatic pumping therex, and donning/ doffing progression garments.   Baseline dependent   Time 12   Period Weeks   Status Achieved     OT LONG TERM GOAL #5   Title Pt to remain infection free throughout CDT course to limit infection and LE progression.   Baseline dependent   Time 12   Period Weeks   Status On-going     OT LONG TERM GOAL #6   Title During Management Phase CDT Pt to sustain limb volume reductions achieved during Intensive Phase CDT within  5% utilizing LE self-care protocols, appropriate compression garments/ devices, and moderate caregiver assistance.   Baseline dependent   Time 6   Status On-going               Plan - 10/16/16 1446    Clinical Impression Statement BLE comparative limb volumetrics reveal Pt has met and exceeded 10% volume reduction at both A-D and A-G LLE landmarks. LLE A-D volume DEcreased by additional 15.2% since last measured on 10/02/16, and by 27.93% overall since commencing Intensive CDT on 08/27/16, LLE ankle to groin (A-G) limb volume is DEcreased by an additional 15.17% since last measured on 10/02/16. Overall A-G volume is decreased by 23.01% since commencing CDT.. Skin integrity continues to improve  and parent is dedicated to assisting w/ compression wraps as instructed.    Rehab Potential Good   Clinical Impairments Affecting Rehab Potential see SUBJECTIVE section for list of functional impairments 2/2 BLE LE   OT Frequency 3x / week   OT Duration 12 weeks   OT Treatment/Interventions Self-care/ADL training;Therapeutic exercise;Patient/family education;Manual Therapy;Manual lymph drainage;Therapeutic exercises;DME and/or AE instruction;Compression bandaging;Therapeutic activities;Scar mobilization;Other (comment)  skin care to limit infection risk and improve flexibility/ AROM      Patient will benefit from skilled therapeutic intervention in order to improve the following deficits and impairments:  Abnormal gait, Decreased skin integrity, Decreased knowledge of precautions, Decreased scar mobility, Impaired perceived functional ability, Improper body mechanics, Decreased activity tolerance, Decreased knowledge of use of DME, Impaired flexibility, Decreased balance, Difficulty walking, Obesity, Decreased range of motion, Increased edema, Pain  Visit Diagnosis: Lymphedema, not elsewhere classified    Problem List Patient Active Problem List   Diagnosis Date Noted  . Episodic tension-type  headache, not intractable 07/22/2015  . Hydrocephalus, adult 09/10/2014  . Glaucoma 08/22/2014  . Preventative health care 05/18/2011  . Lymphedema 08/06/2006   Andrey Spearman, MS, OTR/L, [facility] 10/16/16 2:49 PM  [place] MAIN [facility] SERVICES [address] [place], [place], [address] Phone: [phone]  Fax:  [phone]  Name: Kaydan Wilhoite MRN: [id number] Date of Birth: Feb 06, 1971

## 2016-10-21 ENCOUNTER — Ambulatory Visit
Payer: Medicare Other | Attending: Student in an Organized Health Care Education/Training Program | Admitting: Occupational Therapy

## 2016-10-21 DIAGNOSIS — I890 Lymphedema, not elsewhere classified: Secondary | ICD-10-CM | POA: Diagnosis not present

## 2016-10-21 NOTE — Therapy (Signed)
[place] MAIN [facility] SERVICES [address] [place], [place], [address] Phone: [phone]   Fax:  [phone]  Occupational Therapy Treatment  Patient Details  Name: Alison Fox MRN: [id number] Date of Birth: 1970-11-20 No Data Recorded  Encounter Date: 10/21/2016      OT End of Session - 10/21/16 1315    Visit Number 13   Number of Visits 36   Date for OT Re-Evaluation 11/30/16   OT Start Time 0805   OT Stop Time 0905   OT Time Calculation (min) 60 min      Past Medical History:  Diagnosis Date  . Glaucoma 08/22/2014  . Headache   . 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 On 01/21/15, she saw Dr. Kathyrn Sheriff at [facility].  She had a repeat MRI of the brain without contrast which demonstrated symmetric ventriculomegaly of the lateral ventricles and third ventricle without significant transependymal flow.  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  Endoscopic third ventriculostomy was offered but not insisted since her only symptoms are headache, which is now mild.  She did not wish to proceed with this procedure.  . Lymphedema    Chronic following left knee surgery in 2000.  . Varicose veins     Past Surgical History:  Procedure Laterality Date  . KNEE ARTHROSCOPY  2000   Left knee.     There were no vitals filed for this visit.      Subjective Assessment - 10/21/16 1225    Subjective  Pt presnts for OT visit 13 of Intensive Phase CDT to treat BLE lymphedema. Pt is accompanied by her mother. Pt states she's been doing well during holiday break. She tells me she has not yet heard from garmentr vendor about appointment for garment measurements.   Patient is accompained by: Family member   Pertinent History onset in 45s and positive family history for maternal grandmother and cousin  suggests primary LE Tarda; arthroscopic knee sx ~ 2000?, recent fall w/ head laceration. 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   Limitations difficulty walking, decreased balance, suspected BLE suspected, R>L,     Patient Stated Goals replace my stockings and get the swelling down again.   Currently in Pain? No/denies   Pain Onset More than a month ago                      OT Treatments/Exercises (OP) - 10/21/16 0001      ADLs   ADL Education Given Yes     Manual Therapy   Manual Therapy Edema management;Manual Lymphatic Drainage (MLD);Compression Bandaging   Soft tissue mobilization fibrosis technique at L medial and lateral malleolii, scar massage   Manual Lymphatic Drainage (MLD) Manual lymph drainage (MLD) to LLE in supine utilizing functional inguinal lymph nodes and deep abdominal lymphatics as is customary for non-cancer related lower extremity LE, including bilateral "short neck" sequence, J strokes to sub and supraclavicular LN, deep abdominal pathways, functional inguinal LN, lower extremity proximal to distal w/ emphasis on medial knee bottleneck and politeal LN. Performed fibrosis technique to B maleoli and distal  legs to address fatty fibrosis. Good tolerance.   Compression Bandaging LLE gradient compression  wraps applied circumferentially in gradient configuration from toes to groin as follows: custom toe wrap using 1 and 2" co-wrap under cotton stockinett from toes to groin; 8 cm x 1 to foot and ankle, 10 cm x 1, then 12 cm x 2- all layered over .04 x 10 cm and 12 cm Rosidol Soft foam from A-G.Placed 1 dense foam kidney, fabricated in clinic today. Behind and slightly below both left maleolii in effort to increase compression to fatty fibrosis in this area.   Other Manual Therapy skin  care LLE                OT Education - 10/21/16 1232    Education provided Yes   Education Details Con tinued skilled Pt/caregiver Education  And LE ADL training throughout visit for lymphedema self care, including compression wrapping, compression garment and device wear/care, lymphatic pumping ther ex, simple self-MLD, and skin care. Discussed progress towards goals.   Person(s) Educated Patient;Parent(s)   Methods Explanation   Comprehension Verbalized understanding;Need further instruction          OT Short Term Goals - 09/01/16 1356      OT SHORT TERM GOAL #1   Title Lymphedema (LE) management/ self-care: Pt able to apply multi layered, gradient compression wraps with MAX caregiver assistance using proper techniques within 2 weeks to achieve optimal limb volume reduction.   Baseline max caregiver assist; caregiver min assist   Time 2   Period Weeks   Status New     OT SHORT TERM GOAL #2   Title Lymphedema (LE) management/ self-care:  Pt to achieve at least 10% LLE limb volume reductions bilaterally during Intensive CDT to limit LE progression, decrease infection and falls risk, to reduce pain/, and to improve safe ambulation and functional mobility.   Baseline max assist   Time 12   Period Weeks   Status New     OT SHORT TERM GOAL #3   Title Lymphedema (LE) management/ self-care:  Pt >/= 85 % compliant with all daily, LE self-care protocols for home program w/ needed level of caregiver assistance , including simple self-manual lymphatic drainage (MLD), skin care, lymphatic pumping the ex, skin care, and donning/ doffing compression wraps and garments o limit LE progression and further functional decline.     Baseline Max A   Time 12   Period Weeks   Status New     OT SHORT TERM GOAL #4   Title Lymphedema (LE) management/ self-care:  Pt to tolerate daily compression wraps, garments and devices in keeping w/ prescribed wear regime within 1 week of issue date to  progress and retain clinical and functional gains and to limit LE progression.   Baseline mod A   Time 12   Period Weeks   Status New     OT SHORT TERM GOAL #5   Title Lymphedema (LE) self-care:  During Management Phase CDT Pt to sustain limb volume reductions achieved during Intensive Phase CDT within 5% utilizing LE self-care protocols, appropriate compression garments/ devices, and needed level of caregiver assistance.   Baseline Max A   Time 6   Period Months   Status New           OT Long Term Goals - 10/09/16 1227      OT LONG TERM GOAL #1   Title Pt able to correctly apply gradient compression wraps to below knee with moderate caregiver assistance for optimal limb volume reduction and improvement in tissue integrity.  Baseline dependent    Time 2   Period Weeks   Status Achieved     OT LONG TERM GOAL #2   Title Pt to achieve 15% limb volume reduction in LLE, and 10% reduction in RLE by DC to limit lymphedema (LE) progression and infection risk.- NEW GOAL 25%   Baseline dependent   Time 12   Status Partially Met     OT LONG TERM GOAL #3   Title Pt > 75% compliant with all daily LE self-care protocols w/ moderate caregiver assistance, including simple self-manual lymphatic drainage (MLD), skin care, lymphatic pumping therex, and donning/ doffing progression garments.   Baseline dependent   Time 12   Period Weeks   Status Achieved     OT LONG TERM GOAL #5   Title Pt to remain infection free throughout CDT course to limit infection and LE progression.   Baseline dependent   Time 12   Period Weeks   Status On-going     OT LONG TERM GOAL #6   Title During Management Phase CDT Pt to sustain limb volume reductions achieved during Intensive Phase CDT within 5% utilizing LE self-care protocols, appropriate compression garments/ devices, and moderate caregiver assistance.   Baseline dependent   Time 6   Status On-going               Plan - 10/21/16 1316     Clinical Impression Statement OT contacted D[employer] via email to check status of plan to measure for custom compression garments. OT had submitted garment specifications on 12/18 and Pt had not yet heard from [employer] re scheduling. Vendor returned email  stating they were attempting ton contact Pt to schedule for 10/28/15. OT will F/U  w/ Pt on friday since they are so difficult to contact by phone. Pt managing well today. Limb swelling continues to respond well to CDT and Pt is utilizing compression between visits at home w/ mother's continued support. Cont as per POC. FIT compression ASAP. Ensure Pt is fitted with  BLE garments x 2  thjia time.   Rehab Potential Good   Clinical Impairments Affecting Rehab Potential see SUBJECTIVE section for list of functional impairments 2/2 BLE LE   OT Frequency 3x / week   OT Duration 12 weeks   OT Treatment/Interventions Self-care/ADL training;Therapeutic exercise;Patient/family education;Manual Therapy;Manual lymph drainage;Therapeutic exercises;DME and/or AE instruction;Compression bandaging;Therapeutic activities;Scar mobilization;Other (comment)  skin care to limit infection risk and improve flexibility/ AROM      Patient will benefit from skilled therapeutic intervention in order to improve the following deficits and impairments:  Abnormal gait, Decreased skin integrity, Decreased knowledge of precautions, Decreased scar mobility, Impaired perceived functional ability, Improper body mechanics, Decreased activity tolerance, Decreased knowledge of use of DME, Impaired flexibility, Decreased balance, Difficulty walking, Obesity, Decreased range of motion, Increased edema, Pain  Visit Diagnosis: Lymphedema    Problem List Patient Active Problem List   Diagnosis Date Noted  . Episodic tension-type headache, not intractable 07/22/2015  . Hydrocephalus, adult 09/10/2014  . Glaucoma 08/22/2014  . Preventative health care 05/18/2011  . Lymphedema 08/06/2006     Andrey Spearman, MS, OTR/L, CLT-LANA 10/21/16 1:21 PM  [place] MAIN [facility] SERVICES [address] [place], [place], [address] Phone: [phone]   Fax:  [phone]  Name: Alison Fox MRN: [id number] Date of Birth: [birth year]

## 2016-10-23 ENCOUNTER — Ambulatory Visit: Payer: Medicare Other | Admitting: Occupational Therapy

## 2016-10-23 DIAGNOSIS — I890 Lymphedema, not elsewhere classified: Secondary | ICD-10-CM

## 2016-10-23 NOTE — Patient Instructions (Signed)
LE instructions and precautions as established- see initial eval.   

## 2016-10-23 NOTE — Therapy (Signed)
[place] MAIN [facility] SERVICES [address] [place], [place], [address] Phone: [phone]   Fax:  [phone]  Occupational Therapy Treatment  Patient Details  Name: Alison Fox MRN: [id number] Date of Birth: 15-Apr-1971 No Data Recorded  Encounter Date: 10/23/2016      OT End of Session - 10/23/16 1010    Visit Number 14   Number of Visits 36   Date for OT Re-Evaluation 11/30/16   OT Start Time 0902   OT Stop Time 1000   OT Time Calculation (min) 58 min      Past Medical History:  Diagnosis Date  . Glaucoma 08/22/2014  . Headache   . 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 On 01/21/15, she saw Dr. Kathyrn Sheriff at [facility].  She had a repeat MRI of the brain without contrast which demonstrated symmetric ventriculomegaly of the lateral ventricles and third ventricle without significant transependymal flow.  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  Endoscopic third ventriculostomy was offered but not insisted since her only symptoms are headache, which is now mild.  She did not wish to proceed with this procedure.  . Lymphedema    Chronic following left knee surgery in 2000.  . Varicose veins     Past Surgical History:  Procedure Laterality Date  . KNEE ARTHROSCOPY  2000   Left knee.     There were no vitals filed for this visit.      Subjective Assessment - 10/23/16 0950    Subjective  Pt presnts for OT visit 14 of Intensive Phase CDT to treat BLE lymphedema. Pt is accompanied by her mother. Pt informed that garment vendor called and left VM on home phone re completing garment measurement on Tues, 1/9. Provided copy of  DME vendor email and contact information. OT emailed vendor w/ update to expedite.    Patient is accompained by: Family member   Pertinent History onset in 93s and  positive family history for maternal grandmother and cousin suggests primary LE Tarda; arthroscopic knee sx ~ 2000?, recent fall w/ head laceration. 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   Limitations difficulty walking, decreased balance, suspected BLE suspected, R>L,     Patient Stated Goals replace my stockings and get the swelling down again.   Currently in Pain? No/denies   Pain Onset More than a month ago                      OT Treatments/Exercises (OP) - 10/23/16 0001      ADLs   ADL Education Given Yes     Manual Therapy   Manual Therapy Edema management;Manual Lymphatic Drainage (MLD);Compression Bandaging   Soft tissue mobilization fibrosis technique at L medial and lateral malleolii, scar massage   Manual Lymphatic Drainage (MLD) Manual lymph drainage (MLD) to LLE in supine utilizing functional inguinal lymph nodes and deep abdominal lymphatics as is customary for non-cancer related lower extremity LE, including bilateral "short neck" sequence, J strokes to sub and supraclavicular LN, deep abdominal pathways, functional inguinal LN, lower extremity proximal to distal w/ emphasis on medial knee bottleneck and politeal LN. Performed fibrosis technique to B maleoli and distal  legs to  address fatty fibrosis. Good tolerance.   Compression Bandaging LLE gradient compression wraps applied circumferentially in gradient configuration from toes to groin as follows: custom toe wrap using 1 and 2" co-wrap under cotton stockinett from toes to groin; 8 cm x 1 to foot and ankle, 10 cm x 1, then 12 cm x 2- all layered over .04 x 10 cm and 12 cm Rosidol Soft foam from A-G.Placed 1 dense foam kidney, fabricated in clinic today. Behind and slightly below both left maleolii in effort to increase compression to  fatty fibrosis in this area.   Other Manual Therapy skin care LLE                OT Education - 10/23/16 1008    Education provided Yes   Education Details Reviewed compression garment and device wear and care routines, including replacement schedule. Reviewed measurement and fitting processes.   Person(s) Educated Patient;Parent(s)   Methods Explanation   Comprehension Verbalized understanding;Need further instruction          OT Short Term Goals - 09/01/16 1356      OT SHORT TERM GOAL #1   Title Lymphedema (LE) management/ self-care: Pt able to apply multi layered, gradient compression wraps with MAX caregiver assistance using proper techniques within 2 weeks to achieve optimal limb volume reduction.   Baseline max caregiver assist; caregiver min assist   Time 2   Period Weeks   Status New     OT SHORT TERM GOAL #2   Title Lymphedema (LE) management/ self-care:  Pt to achieve at least 10% LLE limb volume reductions bilaterally during Intensive CDT to limit LE progression, decrease infection and falls risk, to reduce pain/, and to improve safe ambulation and functional mobility.   Baseline max assist   Time 12   Period Weeks   Status New     OT SHORT TERM GOAL #3   Title Lymphedema (LE) management/ self-care:  Pt >/= 85 % compliant with all daily, LE self-care protocols for home program w/ needed level of caregiver assistance , including simple self-manual lymphatic drainage (MLD), skin care, lymphatic pumping the ex, skin care, and donning/ doffing compression wraps and garments o limit LE progression and further functional decline.     Baseline Max A   Time 12   Period Weeks   Status New     OT SHORT TERM GOAL #4   Title Lymphedema (LE) management/ self-care:  Pt to tolerate daily compression wraps, garments and devices in keeping w/ prescribed wear regime within 1 week of issue date to progress and retain clinical and functional gains and to limit LE progression.    Baseline mod A   Time 12   Period Weeks   Status New     OT SHORT TERM GOAL #5   Title Lymphedema (LE) self-care:  During Management Phase CDT Pt to sustain limb volume reductions achieved during Intensive Phase CDT within 5% utilizing LE self-care protocols, appropriate compression garments/ devices, and needed level of caregiver assistance.   Baseline Max A   Time 6   Period Months   Status New           OT Long Term Goals - 10/09/16 1227      OT LONG TERM GOAL #1   Title Pt able to correctly apply gradient compression wraps to below knee with moderate caregiver assistance for optimal limb volume reduction and improvement in tissue integrity.   Baseline dependent    Time 2  Period Weeks   Status Achieved     OT LONG TERM GOAL #2   Title Pt to achieve 15% limb volume reduction in LLE, and 10% reduction in RLE by DC to limit lymphedema (LE) progression and infection risk.- NEW GOAL 25%   Baseline dependent   Time 12   Status Partially Met     OT LONG TERM GOAL #3   Title Pt > 75% compliant with all daily LE self-care protocols w/ moderate caregiver assistance, including simple self-manual lymphatic drainage (MLD), skin care, lymphatic pumping therex, and donning/ doffing progression garments.   Baseline dependent   Time 12   Period Weeks   Status Achieved     OT LONG TERM GOAL #5   Title Pt to remain infection free throughout CDT course to limit infection and LE progression.   Baseline dependent   Time 12   Period Weeks   Status On-going     OT LONG TERM GOAL #6   Title During Management Phase CDT Pt to sustain limb volume reductions achieved during Intensive Phase CDT within 5% utilizing LE self-care protocols, appropriate compression garments/ devices, and moderate caregiver assistance.   Baseline dependent   Time 6   Status On-going               Plan - 10/23/16 1011    Clinical Impression Statement Pt completed skin care prior to MLD with VC and  set up. MLD to LLE tolerated without difficulty. Limb volume and tissue density continues to improve, as does skin condition. Pt continues to c/o pain in toes when wrapped, which I suspect is 2/2  of some bit of unavoidable joint displacement even when hammer toes  in digits 2-5 are lightly compressed. No toe wraps today to improve comfort when standing 4+ hours at work. Cont as per POC. Pt making excellent ptrogress towards all OT goals.    Rehab Potential Good   Clinical Impairments Affecting Rehab Potential see SUBJECTIVE section for list of functional impairments 2/2 BLE LE   OT Frequency 3x / week   OT Duration 12 weeks   OT Treatment/Interventions Self-care/ADL training;Therapeutic exercise;Patient/family education;Manual Therapy;Manual lymph drainage;Therapeutic exercises;DME and/or AE instruction;Compression bandaging;Therapeutic activities;Scar mobilization;Other (comment)  skin care to limit infection risk and improve flexibility/ AROM      Patient will benefit from skilled therapeutic intervention in order to improve the following deficits and impairments:  Abnormal gait, Decreased skin integrity, Decreased knowledge of precautions, Decreased scar mobility, Impaired perceived functional ability, Improper body mechanics, Decreased activity tolerance, Decreased knowledge of use of DME, Impaired flexibility, Decreased balance, Difficulty walking, Obesity, Decreased range of motion, Increased edema, Pain  Visit Diagnosis: Lymphedema    Problem List Patient Active Problem List   Diagnosis Date Noted  . Episodic tension-type headache, not intractable 07/22/2015  . Hydrocephalus, adult 09/10/2014  . Glaucoma 08/22/2014  . Preventative health care 05/18/2011  . Lymphedema 08/06/2006   Andrey Spearman, MS, OTR/L, [facility] 10/23/16 10:17 AM   [place] MAIN [facility] SERVICES [address] [place], [place], [address] Phone: [phone]   Fax:   [phone]  Name: Carlesha Seiple MRN: [id number] Date of Birth: 10-19-1971

## 2016-10-26 ENCOUNTER — Ambulatory Visit: Payer: Medicare Other | Admitting: Occupational Therapy

## 2016-10-26 DIAGNOSIS — I890 Lymphedema, not elsewhere classified: Secondary | ICD-10-CM

## 2016-10-26 NOTE — Therapy (Signed)
[place] MAIN [facility] SERVICES [address] [place], [place], [address] Phone: [phone]   Fax:  [phone]  Occupational Therapy Treatment  Patient Details  Name: Alison Fox MRN: [id number] Date of Birth: [birth year] No Data Recorded  Encounter Date: 10/26/2016      OT End of Session - 10/26/16 1223    Visit Number 15   Number of Visits 36   Date for OT Re-Evaluation 11/30/16   OT Start Time 0913   OT Stop Time 1010   OT Time Calculation (min) 57 min      Past Medical History:  Diagnosis Date  . Glaucoma 08/22/2014  . Headache   . 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 On 01/21/15, she saw Dr. Kathyrn Sheriff at [facility].  She had a repeat MRI of the brain without contrast which demonstrated symmetric ventriculomegaly of the lateral ventricles and third ventricle without significant transependymal flow.  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  Endoscopic third ventriculostomy was offered but not insisted since her only symptoms are headache, which is now mild.  She did not wish to proceed with this procedure.  . Lymphedema    Chronic following left knee surgery in 2000.  . Varicose veins     Past Surgical History:  Procedure Laterality Date  . KNEE ARTHROSCOPY  2000   Left knee.     There were no vitals filed for this visit.      Subjective Assessment - 10/26/16 0932    Subjective  Pt presnts for OT visit 15 of Intensive Phase CDT to treat BLE lymphedema. Pt is accompanied by her mother. Pt has not yet been fitted by garment vendor, although they called her home Anna. oN pT'S BEHALF  OT left VM for vendor stating that Pt is available for measuring on Tuesday as they had offered earlier. Pt  states she has returned their calls , but she does  not leave VM.   Patient is accompained by: Family member   Pertinent History onset in 79s and positive family history for maternal grandmother and cousin suggests primary LE Tarda; arthroscopic knee sx ~ 2000?, recent fall w/ head laceration. 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   Limitations difficulty walking, decreased balance, suspected BLE suspected, R>L,     Patient Stated Goals replace my stockings and get the swelling down again.   Pain Onset More than a month ago                      OT Treatments/Exercises (OP) - 10/26/16 0001      ADLs   ADL Education Given Yes     Manual Therapy   Manual Therapy Edema management;Manual Lymphatic Drainage (MLD);Compression Bandaging   Soft tissue mobilization fibrosis technique at L medial and lateral malleolii, scar massage   Manual Lymphatic Drainage (MLD) Manual lymph drainage (MLD) to LLE in supine utilizing functional inguinal lymph nodes and deep abdominal lymphatics as is customary for non-cancer related lower extremity LE, including bilateral "short neck" sequence, J strokes to sub and supraclavicular LN, deep abdominal pathways, functional inguinal LN, lower extremity proximal to distal w/ emphasis on medial  knee bottleneck and politeal LN. Performed fibrosis technique to B maleoli and distal  legs to address fatty fibrosis. Good tolerance.   Compression Bandaging LLE gradient compression wraps applied circumferentially in gradient configuration from toes to groin as follows: custom toe wrap using 1 and 2" co-wrap under cotton stockinett from toes to groin; 8 cm x 1 to foot and ankle, 10 cm x 1, then 12 cm x 2- all layered over .04 x 10 cm and 12 cm Rosidol Soft foam from A-G.Placed 1 dense foam kidney, fabricated in clinic today. Behind and  slightly below both left maleolii in effort to increase compression to fatty fibrosis in this area.   Other Manual Therapy skin care LLE                OT Education - 10/26/16 1225    Education provided Yes   Education Details Con tinued skilled Pt/caregiver Education  And LE ADL training throughout visit for lymphedema self care, including compression wrapping, compression garment and device wear/care, lymphatic pumping ther ex, simple self-MLD, and skin care. Discussed progress towards goals.   Person(s) Educated Patient   Methods Explanation;Demonstration   Comprehension Verbalized understanding          OT Short Term Goals - 09/01/16 1356      OT SHORT TERM GOAL #1   Title Lymphedema (LE) management/ self-care: Pt able to apply multi layered, gradient compression wraps with MAX caregiver assistance using proper techniques within 2 weeks to achieve optimal limb volume reduction.   Baseline max caregiver assist; caregiver min assist   Time 2   Period Weeks   Status New     OT SHORT TERM GOAL #2   Title Lymphedema (LE) management/ self-care:  Pt to achieve at least 10% LLE limb volume reductions bilaterally during Intensive CDT to limit LE progression, decrease infection and falls risk, to reduce pain/, and to improve safe ambulation and functional mobility.   Baseline max assist   Time 12   Period Weeks   Status New     OT SHORT TERM GOAL #3   Title Lymphedema (LE) management/ self-care:  Pt >/= 85 % compliant with all daily, LE self-care protocols for home program w/ needed level of caregiver assistance , including simple self-manual lymphatic drainage (MLD), skin care, lymphatic pumping the ex, skin care, and donning/ doffing compression wraps and garments o limit LE progression and further functional decline.     Baseline Max A   Time 12   Period Weeks   Status New     OT SHORT TERM GOAL #4   Title Lymphedema (LE) management/ self-care:  Pt to tolerate daily  compression wraps, garments and devices in keeping w/ prescribed wear regime within 1 week of issue date to progress and retain clinical and functional gains and to limit LE progression.   Baseline mod A   Time 12   Period Weeks   Status New     OT SHORT TERM GOAL #5   Title Lymphedema (LE) self-care:  During Management Phase CDT Pt to sustain limb volume reductions achieved during Intensive Phase CDT within 5% utilizing LE self-care protocols, appropriate compression garments/ devices, and needed level of caregiver assistance.   Baseline Max A   Time 6   Period Months   Status New           OT Long Term Goals - 10/09/16 1227      OT LONG TERM GOAL #1   Title Pt  able to correctly apply gradient compression wraps to below knee with moderate caregiver assistance for optimal limb volume reduction and improvement in tissue integrity.   Baseline dependent    Time 2   Period Weeks   Status Achieved     OT LONG TERM GOAL #2   Title Pt to achieve 15% limb volume reduction in LLE, and 10% reduction in RLE by DC to limit lymphedema (LE) progression and infection risk.- NEW GOAL 25%   Baseline dependent   Time 12   Status Partially Met     OT LONG TERM GOAL #3   Title Pt > 75% compliant with all daily LE self-care protocols w/ moderate caregiver assistance, including simple self-manual lymphatic drainage (MLD), skin care, lymphatic pumping therex, and donning/ doffing progression garments.   Baseline dependent   Time 12   Period Weeks   Status Achieved     OT LONG TERM GOAL #5   Title Pt to remain infection free throughout CDT course to limit infection and LE progression.   Baseline dependent   Time 12   Period Weeks   Status On-going     OT LONG TERM GOAL #6   Title During Management Phase CDT Pt to sustain limb volume reductions achieved during Intensive Phase CDT within 5% utilizing LE self-care protocols, appropriate compression garments/ devices, and moderate caregiver  assistance.   Baseline dependent   Time 6   Status On-going               Plan - 10/26/16 1227    Clinical Impression Statement Pt continues to tolersate all aspects of OT for lymphedema care. Skin condition continues to improve and swelling is well decreased both above and below the knee. Pt remains engaged in all aspects of therapy, and her mother continues to support her with her LE self care HP. Cont as per POC. Awaiting garment measurement.   Rehab Potential Good   Clinical Impairments Affecting Rehab Potential see SUBJECTIVE section for list of functional impairments 2/2 BLE LE   OT Frequency 3x / week   OT Duration 12 weeks   OT Treatment/Interventions Self-care/ADL training;Therapeutic exercise;Patient/family education;Manual Therapy;Manual lymph drainage;Therapeutic exercises;DME and/or AE instruction;Compression bandaging;Therapeutic activities;Scar mobilization;Other (comment)  skin care to limit infection risk and improve flexibility/ AROM      Patient will benefit from skilled therapeutic intervention in order to improve the following deficits and impairments:  Abnormal gait, Decreased skin integrity, Decreased knowledge of precautions, Decreased scar mobility, Impaired perceived functional ability, Improper body mechanics, Decreased activity tolerance, Decreased knowledge of use of DME, Impaired flexibility, Decreased balance, Difficulty walking, Obesity, Decreased range of motion, Increased edema, Pain  Visit Diagnosis: Lymphedema    Problem List Patient Active Problem List   Diagnosis Date Noted  . Episodic tension-type headache, not intractable 07/22/2015  . Hydrocephalus, adult 09/10/2014  . Glaucoma 08/22/2014  . Preventative health care 05/18/2011  . Lymphedema 08/06/2006    Andrey Spearman, MS, OTR/L, [facility] 10/26/16 12:30 PM  [place] MAIN [facility] SERVICES [address] [place], [place], [address] Phone:  [phone]   Fax:  [phone]  Name: Alison Fox MRN: [id number] Date of Birth: 02-18-1971

## 2016-10-26 NOTE — Patient Instructions (Signed)
LE instructions and precautions as established- see initial eval.   

## 2016-10-28 ENCOUNTER — Ambulatory Visit: Payer: Medicare Other | Admitting: Occupational Therapy

## 2016-10-28 DIAGNOSIS — I890 Lymphedema, not elsewhere classified: Secondary | ICD-10-CM | POA: Diagnosis not present

## 2016-10-29 NOTE — Therapy (Signed)
[place] [facility] MA[facility] REH[facility] SERVICES [address] [place], [place], [address] Phone: [phone]   Fax:  [phone]  Occupational Therapy Treatment  Patient Details  Name: Alison Fox MRN: [id number] Date of Birth: 04/28/1971 No Data Recorded  Encounter Date: 10/28/2016      OT End of Session - 10/29/16 1122    Visit Number 16   Number of Visits 36   Date for OT Re-Evaluation 11/30/16   OT Start Time 0907   OT Stop Time 1006   OT Time Calculation (min) 59 min      Past Medical History:  Diagnosis Date  . Glaucoma 08/22/2014  . Headache   . 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 On 01/21/15, she saw Dr. Nundkumar at [facility].  She had a repeat MRI of the brain without contrast which demonstrated symmetric ventriculomegaly of the lateral ventricles and third ventricle without significant transependymal flow.  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  Endoscopic third ventriculostomy was offered but not insisted since her only symptoms are headache, which is now mild.  She did not wish to proceed with this procedure.  . Lymphedema    Chronic following left knee surgery in 2000.  . Varicose veins     Past Surgical History:  Procedure Laterality Date  . KNEE ARTHROSCOPY  2000   Left knee.     There were no vitals filed for this visit.      Subjective Assessment - 10/28/16 0916    Subjective  (P)  Pt presnts for OT visit 16 of Intensive Phase CDT to treat BLE lymphedema. Pt is accompanied by her mother. Pt reports Garment vendor completed anatomical measurements for LLE custom thigh high compression garment and HOS boot yesterday.   Patient is accompained by: (P)  Family member   Pertinent History (P)  onset in 20s and positive family history for maternal grandmother and cousin suggests  primary LE Tarda; arthroscopic knee sx ~ 2000?, recent fall w/ head laceration. 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   Limitations (P)  difficulty walking, decreased balance, suspected BLE suspected, R>L,     Patient Stated Goals (P)  replace my stockings and get the swelling down again.   Pain Onset (P)  More than a month ago                      OT Treatments/Exercises (OP) - 10/29/16 0001      ADLs   ADL Education Given Yes     Manual Therapy   Manual Therapy Edema management;Manual Lymphatic Drainage (MLD);Compression Bandaging   Soft tissue mobilization fibrosis technique at L medial and lateral malleolii, scar massage   Manual Lymphatic Drainage (MLD) Manual lymph drainage (MLD) to LLE in supine utilizing functional inguinal lymph nodes and deep abdominal lymphatics as is customary for non-cancer related lower extremity LE, including bilateral "short neck" sequence, J strokes to sub and supraclavicular LN, deep abdominal pathways, functional inguinal LN, lower extremity proximal to distal w/ emphasis on medial knee bottleneck and politeal LN. Performed fibrosis technique to B maleoli and distal  legs to address fatty fibrosis. Good tolerance.   Compression Bandaging LLE gradient compression wraps   applied circumferentially in gradient configuration from toes to groin as follows: custom toe wrap using 1 and 2" co-wrap under cotton stockinett from toes to groin; 8 cm x 1 to foot and ankle, 10 cm x 1, then 12 cm x 2- all layered over .04 x 10 cm and 12 cm Rosidol Soft foam from A-G.Placed 1 dense foam kidney, fabricated in clinic today. Behind and slightly below both left maleolii in effort to increase compression to fatty fibrosis in this area.   Other Manual Therapy skin care LLE                 OT Education - 10/28/16 1121    Education provided Yes   Education Details Con tinued skilled Pt/caregiver Education  And LE ADL training throughout visit for lymphedema self care, including compression wrapping, compression garment and device wear/care, lymphatic pumping ther ex, simple self-MLD, and skin care. Discussed progress towards goals.   Person(s) Educated Patient;Parent(s)   Methods Explanation   Comprehension Verbalized understanding;Need further instruction          OT Short Term Goals - 09/01/16 1356      OT SHORT TERM GOAL #1   Title Lymphedema (LE) management/ self-care: Pt able to apply multi layered, gradient compression wraps with MAX caregiver assistance using proper techniques within 2 weeks to achieve optimal limb volume reduction.   Baseline max caregiver assist; caregiver min assist   Time 2   Period Weeks   Status New     OT SHORT TERM GOAL #2   Title Lymphedema (LE) management/ self-care:  Pt to achieve at least 10% LLE limb volume reductions bilaterally during Intensive CDT to limit LE progression, decrease infection and falls risk, to reduce pain/, and to improve safe ambulation and functional mobility.   Baseline max assist   Time 12   Period Weeks   Status New     OT SHORT TERM GOAL #3   Title Lymphedema (LE) management/ self-care:  Pt >/= 85 % compliant with all daily, LE self-care protocols for home program w/ needed level of caregiver assistance , including simple self-manual lymphatic drainage (MLD), skin care, lymphatic pumping the ex, skin care, and donning/ doffing compression wraps and garments o limit LE progression and further functional decline.     Baseline Max A   Time 12   Period Weeks   Status New     OT SHORT TERM GOAL #4   Title Lymphedema (LE) management/ self-care:  Pt to tolerate daily compression wraps, garments and devices in keeping w/ prescribed wear regime within 1 week of issue date to progress and  retain clinical and functional gains and to limit LE progression.   Baseline mod A   Time 12   Period Weeks   Status New     OT SHORT TERM GOAL #5   Title Lymphedema (LE) self-care:  During Management Phase CDT Pt to sustain limb volume reductions achieved during Intensive Phase CDT within 5% utilizing LE self-care protocols, appropriate compression garments/ devices, and needed level of caregiver assistance.   Baseline Max A   Time 6   Period Months   Status New           OT Long Term Goals - 10/09/16 1227      OT LONG TERM GOAL #1   Title Pt able to correctly apply gradient compression wraps to below knee with moderate caregiver assistance for optimal limb volume reduction and improvement in tissue integrity.   Baseline   dependent    Time 2   Period Weeks   Status Achieved     OT LONG TERM GOAL #2   Title Pt to achieve 15% limb volume reduction in LLE, and 10% reduction in RLE by DC to limit lymphedema (LE) progression and infection risk.- NEW GOAL 25%   Baseline dependent   Time 12   Status Partially Met     OT LONG TERM GOAL #3   Title Pt > 75% compliant with all daily LE self-care protocols w/ moderate caregiver assistance, including simple self-manual lymphatic drainage (MLD), skin care, lymphatic pumping therex, and donning/ doffing progression garments.   Baseline dependent   Time 12   Period Weeks   Status Achieved     OT LONG TERM GOAL #5   Title Pt to remain infection free throughout CDT course to limit infection and LE progression.   Baseline dependent   Time 12   Period Weeks   Status On-going     OT LONG TERM GOAL #6   Title During Management Phase CDT Pt to sustain limb volume reductions achieved during Intensive Phase CDT within 5% utilizing LE self-care protocols, appropriate compression garments/ devices, and moderate caregiver assistance.   Baseline dependent   Time 6   Status On-going               Plan - 10/29/16 1123    Clinical  Impression Statement Confirmed with DME vendor that anatomical measurements were completed for BLE as requested. Pt tolerated MLD, skin care and compression wrapping to LLE as established today. Skin condition continues to improve, despite increased dryness and redness noted today.  No signs/symptoms of infection. Cont as per POC. Fit compression garments and devices ASAP.ZContinue to support improving LE self care.   Rehab Potential Good   Clinical Impairments Affecting Rehab Potential see SUBJECTIVE section for list of functional impairments 2/2 BLE LE   OT Frequency 3x / week   OT Duration 12 weeks   OT Treatment/Interventions Self-care/ADL training;Therapeutic exercise;Patient/family education;Manual Therapy;Manual lymph drainage;Therapeutic exercises;DME and/or AE instruction;Compression bandaging;Therapeutic activities;Scar mobilization;Other (comment)  skin care to limit infection risk and improve flexibility/ AROM      Patient will benefit from skilled therapeutic intervention in order to improve the following deficits and impairments:  Abnormal gait, Decreased skin integrity, Decreased knowledge of precautions, Decreased scar mobility, Impaired perceived functional ability, Improper body mechanics, Decreased activity tolerance, Decreased knowledge of use of DME, Impaired flexibility, Decreased balance, Difficulty walking, Obesity, Decreased range of motion, Increased edema, Pain  Visit Diagnosis: Lymphedema    Problem List Patient Active Problem List   Diagnosis Date Noted  . Episodic tension-type headache, not intractable 07/22/2015  . Hydrocephalus, adult 09/10/2014  . Glaucoma 08/22/2014  . Preventative health care 05/18/2011  . Lymphedema 08/06/2006    Andrey Spearman, MS, OTR/L, [facility] 10/29/16 11:27 AM  [place] MAIN [facility] SERVICES [address] [place], [place], [address] Phone: [phone]   Fax:  [phone]  Name: Alison Fox MRN: [id number] Date of Birth: 1970-11-24

## 2016-10-30 ENCOUNTER — Ambulatory Visit: Payer: Medicare Other | Admitting: Occupational Therapy

## 2016-10-30 DIAGNOSIS — I890 Lymphedema, not elsewhere classified: Secondary | ICD-10-CM | POA: Diagnosis not present

## 2016-10-30 NOTE — Therapy (Signed)
[place] MAIN [facility] SERVICES [address] [place], [place], [address] Phone: [phone]   Fax:  [phone]  Occupational Therapy Treatment  Patient Details  Name: Alison Fox MRN: [id number] Date of Birth: 10-25-1970 No Data Recorded  Encounter Date: 10/30/2016      OT End of Session - 10/30/16 0959    Visit Number 17   Number of Visits 36   Date for OT Re-Evaluation 11/30/16   OT Start Time 0910   OT Stop Time 0955   OT Time Calculation (min) 45 min   Activity Tolerance Patient tolerated treatment well;No increased pain   Behavior During Therapy WFL for tasks assessed/performed      Past Medical History:  Diagnosis Date  . Glaucoma 08/22/2014  . Headache   . 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 On 01/21/15, she saw Dr. Kathyrn Sheriff at [facility].  She had a repeat MRI of the brain without contrast which demonstrated symmetric ventriculomegaly of the lateral ventricles and third ventricle without significant transependymal flow.  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  Endoscopic third ventriculostomy was offered but not insisted since her only symptoms are headache, which is now mild.  She did not wish to proceed with this procedure.  . Lymphedema    Chronic following left knee surgery in 2000.  . Varicose veins     Past Surgical History:  Procedure Laterality Date  . KNEE ARTHROSCOPY  2000   Left knee.     There were no vitals filed for this visit.      Subjective Assessment - 10/30/16 0957    Subjective  Pt presnts for OT visit 17 of Intensive Phase CDT to treat BLE lymphedema. Pt is accompanied by her mother. Pt reports Garment vendor completed anatomical measurements for LLE custom thigh high compression garment and HOS boot yesterday.   Patient is accompained by: Family  member   Pertinent History onset in 39s and positive family history for maternal grandmother and cousin suggests primary LE Tarda; arthroscopic knee sx ~ 2000?, recent fall w/ head laceration. 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   Limitations difficulty walking, decreased balance, suspected BLE suspected, R>L,     Patient Stated Goals replace my stockings and get the swelling down again.   Currently in Pain? No/denies   Pain Onset More than a month ago                      OT Treatments/Exercises (OP) - 10/30/16 0001      Manual Therapy   Manual Therapy Edema management;Manual Lymphatic Drainage (MLD);Compression Bandaging   Soft tissue mobilization fibrosis technique at L medial and lateral malleolii, scar massage   Manual Lymphatic Drainage (MLD) Manual lymph drainage (MLD) to LLE in supine utilizing functional inguinal lymph nodes and deep abdominal lymphatics as is customary for non-cancer related lower extremity LE, including bilateral "short neck" sequence, J strokes to sub and supraclavicular LN, deep abdominal pathways, functional inguinal LN, lower extremity proximal to distal w/ emphasis on medial knee bottleneck and politeal LN. Performed fibrosis technique to B maleoli and distal  legs to address fatty fibrosis. Good tolerance.   Compression Bandaging LLE gradient  compression wraps applied circumferentially in gradient configuration from toes to groin as follows: custom toe wrap using 1 and 2" co-wrap under cotton stockinett from toes to groin; 8 cm x 1 to foot and ankle, 10 cm x 1, then 12 cm x 2- all layered over .04 x 10 cm and 12 cm Rosidol Soft foam from A-G.Placed 1 dense foam kidney, fabricated in clinic today. Behind and slightly below both left maleolii in effort to increase  compression to fatty fibrosis in this area.   Other Manual Therapy skin care LLE                OT Education - 10/30/16 0958    Education provided No          OT Short Term Goals - 09/01/16 1356      OT SHORT TERM GOAL #1   Title Lymphedema (LE) management/ self-care: Pt able to apply multi layered, gradient compression wraps with MAX caregiver assistance using proper techniques within 2 weeks to achieve optimal limb volume reduction.   Baseline max caregiver assist; caregiver min assist   Time 2   Period Weeks   Status New     OT SHORT TERM GOAL #2   Title Lymphedema (LE) management/ self-care:  Pt to achieve at least 10% LLE limb volume reductions bilaterally during Intensive CDT to limit LE progression, decrease infection and falls risk, to reduce pain/, and to improve safe ambulation and functional mobility.   Baseline max assist   Time 12   Period Weeks   Status New     OT SHORT TERM GOAL #3   Title Lymphedema (LE) management/ self-care:  Pt >/= 85 % compliant with all daily, LE self-care protocols for home program w/ needed level of caregiver assistance , including simple self-manual lymphatic drainage (MLD), skin care, lymphatic pumping the ex, skin care, and donning/ doffing compression wraps and garments o limit LE progression and further functional decline.     Baseline Max A   Time 12   Period Weeks   Status New     OT SHORT TERM GOAL #4   Title Lymphedema (LE) management/ self-care:  Pt to tolerate daily compression wraps, garments and devices in keeping w/ prescribed wear regime within 1 week of issue date to progress and retain clinical and functional gains and to limit LE progression.   Baseline mod A   Time 12   Period Weeks   Status New     OT SHORT TERM GOAL #5   Title Lymphedema (LE) self-care:  During Management Phase CDT Pt to sustain limb volume reductions achieved during Intensive Phase CDT within 5% utilizing LE self-care protocols,  appropriate compression garments/ devices, and needed level of caregiver assistance.   Baseline Max A   Time 6   Period Months   Status New           OT Long Term Goals - 10/09/16 1227      OT LONG TERM GOAL #1   Title Pt able to correctly apply gradient compression wraps to below knee with moderate caregiver assistance for optimal limb volume reduction and improvement in tissue integrity.   Baseline dependent    Time 2   Period Weeks   Status Achieved     OT LONG TERM GOAL #2   Title Pt to achieve 15% limb volume reduction in LLE, and 10% reduction in RLE by DC to limit lymphedema (LE) progression and infection risk.- NEW GOAL 25%  Baseline dependent   Time 12   Status Partially Met     OT LONG TERM GOAL #3   Title Pt > 75% compliant with all daily LE self-care protocols w/ moderate caregiver assistance, including simple self-manual lymphatic drainage (MLD), skin care, lymphatic pumping therex, and donning/ doffing progression garments.   Baseline dependent   Time 12   Period Weeks   Status Achieved     OT LONG TERM GOAL #5   Title Pt to remain infection free throughout CDT course to limit infection and LE progression.   Baseline dependent   Time 12   Period Weeks   Status On-going     OT LONG TERM GOAL #6   Title During Management Phase CDT Pt to sustain limb volume reductions achieved during Intensive Phase CDT within 5% utilizing LE self-care protocols, appropriate compression garments/ devices, and moderate caregiver assistance.   Baseline dependent   Time 6   Status On-going               Plan - 10/30/16 0959    Clinical Impression Statement Limb volume decreased to all time low and tissue integrity throughout limb is more mobile, better hydrated, and less dense than in Rx history. Pt managing very well between visits w/ mother's help and continues to demonstrate steady progress towards all OT goals  for CDT and LE self care. Pt tolerated manual  modalities well today. Cont as per POC.   Rehab Potential Good   Clinical Impairments Affecting Rehab Potential see SUBJECTIVE section for list of functional impairments 2/2 BLE LE   OT Frequency 3x / week   OT Duration 12 weeks   OT Treatment/Interventions Self-care/ADL training;Therapeutic exercise;Patient/family education;Manual Therapy;Manual lymph drainage;Therapeutic exercises;DME and/or AE instruction;Compression bandaging;Therapeutic activities;Scar mobilization;Other (comment)  skin care to limit infection risk and improve flexibility/ AROM      Patient will benefit from skilled therapeutic intervention in order to improve the following deficits and impairments:  Abnormal gait, Decreased skin integrity, Decreased knowledge of precautions, Decreased scar mobility, Impaired perceived functional ability, Improper body mechanics, Decreased activity tolerance, Decreased knowledge of use of DME, Impaired flexibility, Decreased balance, Difficulty walking, Obesity, Decreased range of motion, Increased edema, Pain  Visit Diagnosis: Lymphedema    Problem List Patient Active Problem List   Diagnosis Date Noted  . Episodic tension-type headache, not intractable 07/22/2015  . Hydrocephalus, adult 09/10/2014  . Glaucoma 08/22/2014  . Preventative health care 05/18/2011  . Lymphedema 08/06/2006   Andrey Spearman, MS, OTR/L, [facility] 10/30/16 10:02 AM   [place] MAIN [facility] SERVICES [address] [place], [place], [address] Phone: [phone]   Fax:  [phone]  Name: Alison Fox MRN: [id number] Date of Birth: November 03, 1970

## 2016-10-30 NOTE — Patient Instructions (Signed)
LE instructions and precautions as established- see initial eval.   

## 2016-11-02 ENCOUNTER — Ambulatory Visit: Payer: Medicare Other | Admitting: Occupational Therapy

## 2016-11-02 DIAGNOSIS — I890 Lymphedema, not elsewhere classified: Secondary | ICD-10-CM | POA: Diagnosis not present

## 2016-11-02 NOTE — Patient Instructions (Signed)
LE instructions and precautions as established- see initial eval.   

## 2016-11-02 NOTE — Therapy (Signed)
[place] MAIN [facility] SERVICES [address] [place], [place], [address] Phone: [phone]   Fax:  [phone]  Occupational Therapy Treatment  Patient Details  Name: Alison Fox MRN: [id number] Date of Birth: 24-Feb-1971 No Data Recorded  Encounter Date: 11/02/2016      OT End of Session - 11/02/16 0915    Visit Number 18   Number of Visits 36   Date for OT Re-Evaluation 11/30/16   OT Start Time 0904   OT Stop Time 1000   OT Time Calculation (min) 56 min   Activity Tolerance Patient tolerated treatment well;No increased pain   Behavior During Therapy WFL for tasks assessed/performed      Past Medical History:  Diagnosis Date  . Glaucoma 08/22/2014  . Headache   . 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 On 01/21/15, she saw Dr. Kathyrn Sheriff at [facility].  She had a repeat MRI of the brain without contrast which demonstrated symmetric ventriculomegaly of the lateral ventricles and third ventricle without significant transependymal flow.  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  Endoscopic third ventriculostomy was offered but not insisted since her only symptoms are headache, which is now mild.  She did not wish to proceed with this procedure.  . Lymphedema    Chronic following left knee surgery in 2000.  . Varicose veins     Past Surgical History:  Procedure Laterality Date  . KNEE ARTHROSCOPY  2000   Left knee.     There were no vitals filed for this visit.      Subjective Assessment - 11/02/16 0915    Subjective  Pt presnts for OT visit 18 total/ 5 for 2018 of Intensive Phase CDT to treat BLE lymphedema. Pt is accompanied by her mother. Pt and mother in agreement with plan to bring extra set of wraps , rolled and ready to appy, to each visit to maximize MLD time in clinic.   Patient  is accompained by: Family member   Pertinent History onset in 44s and positive family history for maternal grandmother and cousin suggests primary LE Tarda; arthroscopic knee sx ~ 2000?, recent fall w/ head laceration. 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   Limitations difficulty walking, decreased balance, suspected BLE suspected, R>L,     Patient Stated Goals replace my stockings and get the swelling down again.   Currently in Pain? No/denies   Pain Onset More than a month ago                      OT Treatments/Exercises (OP) - 11/02/16 0001      ADLs   ADL Education Given Yes     Manual Therapy   Manual Therapy Edema management;Manual Lymphatic Drainage (MLD);Compression Bandaging   Manual therapy comments RLE ssessment and skin care   Soft tissue mobilization fibrosis technique at L medial and lateral malleolii, scar massage   Manual Lymphatic Drainage (MLD) Manual lymph drainage (MLD) to LLE in supine utilizing functional inguinal lymph nodes and deep abdominal lymphatics as is customary for non-cancer related lower extremity LE, including bilateral "short neck" sequence, J strokes to sub and supraclavicular LN, deep abdominal pathways, functional inguinal LN, lower  extremity proximal to distal w/ emphasis on medial knee bottleneck and politeal LN. Performed fibrosis technique to B maleoli and distal  legs to address fatty fibrosis. Good tolerance.   Compression Bandaging LLE gradient compression wraps applied circumferentially in gradient configuration from toes to groin as follows: custom toe wrap using 1 and 2" co-wrap under cotton stockinett from toes to groin; 8 cm x 1 to foot and ankle, 10 cm x 1, then 12 cm x 2- all layered over .04 x 10 cm and 12 cm Rosidol Soft foam from A-G.Placed 1  dense foam kidney, fabricated in clinic today. Behind and slightly below both left maleolii in effort to increase compression to fatty fibrosis in this area.   Other Manual Therapy skin care to BLE                OT Education - 11/02/16 1005    Education provided No   Education Details Con tinued Sports administrator  And LE ADL training throughout visit for lymphedema self care, including compression wrapping, compression garment and device wear/care, lymphatic pumping ther ex, simple self-MLD, and skin care. Discussed progress towards goals.   Person(s) Educated Patient;Parent(s)   Methods Explanation;Demonstration   Comprehension Verbalized understanding;Need further instruction          OT Short Term Goals - 09/01/16 1356      OT SHORT TERM GOAL #1   Title Lymphedema (LE) management/ self-care: Pt able to apply multi layered, gradient compression wraps with MAX caregiver assistance using proper techniques within 2 weeks to achieve optimal limb volume reduction.   Baseline max caregiver assist; caregiver min assist   Time 2   Period Weeks   Status New     OT SHORT TERM GOAL #2   Title Lymphedema (LE) management/ self-care:  Pt to achieve at least 10% LLE limb volume reductions bilaterally during Intensive CDT to limit LE progression, decrease infection and falls risk, to reduce pain/, and to improve safe ambulation and functional mobility.   Baseline max assist   Time 12   Period Weeks   Status New     OT SHORT TERM GOAL #3   Title Lymphedema (LE) management/ self-care:  Pt >/= 85 % compliant with all daily, LE self-care protocols for home program w/ needed level of caregiver assistance , including simple self-manual lymphatic drainage (MLD), skin care, lymphatic pumping the ex, skin care, and donning/ doffing compression wraps and garments o limit LE progression and further functional decline.     Baseline Max A   Time 12   Period Weeks   Status New     OT  SHORT TERM GOAL #4   Title Lymphedema (LE) management/ self-care:  Pt to tolerate daily compression wraps, garments and devices in keeping w/ prescribed wear regime within 1 week of issue date to progress and retain clinical and functional gains and to limit LE progression.   Baseline mod A   Time 12   Period Weeks   Status New     OT SHORT TERM GOAL #5   Title Lymphedema (LE) self-care:  During Management Phase CDT Pt to sustain limb volume reductions achieved during Intensive Phase CDT within 5% utilizing LE self-care protocols, appropriate compression garments/ devices, and needed level of caregiver assistance.   Baseline Max A   Time 6   Period Months   Status New           OT Long Term Goals - 10/09/16 1227  OT LONG TERM GOAL #1   Title Pt able to correctly apply gradient compression wraps to below knee with moderate caregiver assistance for optimal limb volume reduction and improvement in tissue integrity.   Baseline dependent    Time 2   Period Weeks   Status Achieved     OT LONG TERM GOAL #2   Title Pt to achieve 15% limb volume reduction in LLE, and 10% reduction in RLE by DC to limit lymphedema (LE) progression and infection risk.- NEW GOAL 25%   Baseline dependent   Time 12   Status Partially Met     OT LONG TERM GOAL #3   Title Pt > 75% compliant with all daily LE self-care protocols w/ moderate caregiver assistance, including simple self-manual lymphatic drainage (MLD), skin care, lymphatic pumping therex, and donning/ doffing progression garments.   Baseline dependent   Time 12   Period Weeks   Status Achieved     OT LONG TERM GOAL #5   Title Pt to remain infection free throughout CDT course to limit infection and LE progression.   Baseline dependent   Time 12   Period Weeks   Status On-going     OT LONG TERM GOAL #6   Title During Management Phase CDT Pt to sustain limb volume reductions achieved during Intensive Phase CDT within 5% utilizing LE  self-care protocols, appropriate compression garments/ devices, and moderate caregiver assistance.   Baseline dependent   Time 6   Status On-going               Plan - 11/02/16 1010    Clinical Impression Statement Physical examnination and assessment of RLE reveals swelling is very well managed at present. Vernard Gambles is dry, flaking, and discolored on anterior aspect of leg and above toes in area of "lymphatic lakes" with hemosiderine stain. midway below knee. Pt has small wound on medial aspect midway below knee covered by bandaide. Awaiting garment replacement for this lef as current one is too large and completely worn out.  LLE is slightly more swollentoday due to inconsistencyin wrap application. Overall though, swelling and skin condition are well managed. Pt tolerated all aspects of tx this morning, including BLE skin care and LLE MLD and compression therapy. Cont as per POC.   Rehab Potential Good   Clinical Impairments Affecting Rehab Potential see SUBJECTIVE section for list of functional impairments 2/2 BLE LE   OT Frequency 3x / week   OT Duration 12 weeks   OT Treatment/Interventions Self-care/ADL training;Therapeutic exercise;Patient/family education;Manual Therapy;Manual lymph drainage;Therapeutic exercises;DME and/or AE instruction;Compression bandaging;Therapeutic activities;Scar mobilization;Other (comment)  skin care to limit infection risk and improve flexibility/ AROM      Patient will benefit from skilled therapeutic intervention in order to improve the following deficits and impairments:  Abnormal gait, Decreased skin integrity, Decreased knowledge of precautions, Decreased scar mobility, Impaired perceived functional ability, Improper body mechanics, Decreased activity tolerance, Decreased knowledge of use of DME, Impaired flexibility, Decreased balance, Difficulty walking, Obesity, Decreased range of motion, Increased edema, Pain  Visit  Diagnosis: Lymphedema    Problem List Patient Active Problem List   Diagnosis Date Noted  . Episodic tension-type headache, not intractable 07/22/2015  . Hydrocephalus, adult 09/10/2014  . Glaucoma 08/22/2014  . Preventative health care 05/18/2011  . Lymphedema 08/06/2006    Ansel Bong 11/02/2016, 10:21 AM  [place] MAIN [facility] SERVICES [address] [place], [place], [address] Phone: [phone]   Fax:  [phone]  Name: Jayline Kilburg MRN: [id number] Date of Birth: October 13, 1971

## 2016-11-04 ENCOUNTER — Ambulatory Visit: Payer: Medicare Other | Admitting: Occupational Therapy

## 2016-11-06 ENCOUNTER — Ambulatory Visit: Payer: Medicare Other | Admitting: Occupational Therapy

## 2016-11-11 ENCOUNTER — Ambulatory Visit: Payer: Medicare Other | Admitting: Occupational Therapy

## 2016-11-11 DIAGNOSIS — I890 Lymphedema, not elsewhere classified: Secondary | ICD-10-CM | POA: Diagnosis not present

## 2016-11-11 NOTE — Therapy (Signed)
[place] MAIN [facility] SERVICES [address] [place], [place], [address] Phone: [phone]   Fax:  [phone]  Occupational Therapy Treatment  Patient Details  Name: Alison Fox MRN: [id number] Date of Birth: [birth year] No Data Recorded  Encounter Date: 11/11/2016      OT End of Session - 11/11/16 1056    Visit Number 19   Number of Visits 36   Date for OT Re-Evaluation 11/30/16   OT Start Time 0915   OT Stop Time 1015   OT Time Calculation (min) 60 min   Activity Tolerance Patient tolerated treatment well;No increased pain   Behavior During Therapy WFL for tasks assessed/performed      Past Medical History:  Diagnosis Date  . Glaucoma 08/22/2014  . Headache   . 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 On 01/21/15, she saw Dr. Kathyrn Sheriff at [facility].  She had a repeat MRI of the brain without contrast which demonstrated symmetric ventriculomegaly of the lateral ventricles and third ventricle without significant transependymal flow.  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  Endoscopic third ventriculostomy was offered but not insisted since her only symptoms are headache, which is now mild.  She did not wish to proceed with this procedure.  . Lymphedema    Chronic following left knee surgery in 2000.  . Varicose veins     Past Surgical History:  Procedure Laterality Date  . KNEE ARTHROSCOPY  2000   Left knee.     There were no vitals filed for this visit.      Subjective Assessment - 11/11/16 1053    Subjective  Pt presnts for OT visit 31 total/ 6 for 2018 of Intensive Phase CDT to treat BLE lymphedema. Pt is accompanied by her mother. Pt has no new complaints today.   Patient is accompained by: Family member   Pertinent History onset in 26s and positive family history for maternal  grandmother and cousin suggests primary LE Tarda; arthroscopic knee sx ~ 2000?, recent fall w/ head laceration. 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   Limitations difficulty walking, decreased balance, suspected BLE suspected, R>L,     Patient Stated Goals replace my stockings and get the swelling down again.   Currently in Pain? Yes  sore L lateral proximal thigh- not rated numerically   Pain Onset More than a month ago                      OT Treatments/Exercises (OP) - 11/11/16 0001      ADLs   ADL Education Given Yes     Manual Therapy   Manual Therapy Edema management;Manual Lymphatic Drainage (MLD);Compression Bandaging   Manual therapy comments RLE ssessment and skin care   Soft tissue mobilization fibrosis technique at L medial and lateral malleolii, scar massage   Manual Lymphatic Drainage (MLD) Manual lymph drainage (MLD) to LLE in supine utilizing functional inguinal lymph nodes and deep abdominal lymphatics as is customary for non-cancer related lower extremity LE, including bilateral "short neck" sequence, J strokes to sub and supraclavicular LN, deep abdominal pathways, functional inguinal LN, lower extremity proximal to distal w/ emphasis on medial knee bottleneck and politeal LN.  Performed fibrosis technique to B maleoli and distal  legs to address fatty fibrosis. Good tolerance.   Compression Bandaging LLE gradient compression wraps applied circumferentially in gradient configuration from toes to groin as follows: custom toe wrap using 1 and 2" co-wrap under cotton stockinett from toes to groin; 8 cm x 1 to foot and ankle, 10 cm x 1, then 12 cm x 2- all layered over .04 x 10 cm and 12 cm Rosidol Soft foam from A-G.Placed 1 dense foam kidney, fabricated in clinic today. Behind and  slightly below both left maleolii in effort to increase compression to fatty fibrosis in this area.   Other Manual Therapy skin care to BLE                OT Education - 11/11/16 1055    Education provided Yes   Education Details Con tinued skilled Pt/caregiver Education  And LE ADL training throughout visit for lymphedema self care, including compression wrapping, compression garment and device wear/care, lymphatic pumping ther ex, simple self-MLD, and skin care. Discussed progress towards goals.   Person(s) Educated Patient;Parent(s)   Methods Explanation   Comprehension Verbalized understanding;Need further instruction          OT Short Term Goals - 09/01/16 1356      OT SHORT TERM GOAL #1   Title Lymphedema (LE) management/ self-care: Pt able to apply multi layered, gradient compression wraps with MAX caregiver assistance using proper techniques within 2 weeks to achieve optimal limb volume reduction.   Baseline max caregiver assist; caregiver min assist   Time 2   Period Weeks   Status New     OT SHORT TERM GOAL #2   Title Lymphedema (LE) management/ self-care:  Pt to achieve at least 10% LLE limb volume reductions bilaterally during Intensive CDT to limit LE progression, decrease infection and falls risk, to reduce pain/, and to improve safe ambulation and functional mobility.   Baseline max assist   Time 12   Period Weeks   Status New     OT SHORT TERM GOAL #3   Title Lymphedema (LE) management/ self-care:  Pt >/= 85 % compliant with all daily, LE self-care protocols for home program w/ needed level of caregiver assistance , including simple self-manual lymphatic drainage (MLD), skin care, lymphatic pumping the ex, skin care, and donning/ doffing compression wraps and garments o limit LE progression and further functional decline.     Baseline Max A   Time 12   Period Weeks   Status New     OT SHORT TERM GOAL #4   Title Lymphedema (LE) management/ self-care:  Pt  to tolerate daily compression wraps, garments and devices in keeping w/ prescribed wear regime within 1 week of issue date to progress and retain clinical and functional gains and to limit LE progression.   Baseline mod A   Time 12   Period Weeks   Status New     OT SHORT TERM GOAL #5   Title Lymphedema (LE) self-care:  During Management Phase CDT Pt to sustain limb volume reductions achieved during Intensive Phase CDT within 5% utilizing LE self-care protocols, appropriate compression garments/ devices, and needed level of caregiver assistance.   Baseline Max A   Time 6   Period Months   Status New           OT Long Term Goals - 10/09/16 1227      OT LONG TERM GOAL #1   Title Pt able to  correctly apply gradient compression wraps to below knee with moderate caregiver assistance for optimal limb volume reduction and improvement in tissue integrity.   Baseline dependent    Time 2   Period Weeks   Status Achieved     OT LONG TERM GOAL #2   Title Pt to achieve 15% limb volume reduction in LLE, and 10% reduction in RLE by DC to limit lymphedema (LE) progression and infection risk.- NEW GOAL 25%   Baseline dependent   Time 12   Status Partially Met     OT LONG TERM GOAL #3   Title Pt > 75% compliant with all daily LE self-care protocols w/ moderate caregiver assistance, including simple self-manual lymphatic drainage (MLD), skin care, lymphatic pumping therex, and donning/ doffing progression garments.   Baseline dependent   Time 12   Period Weeks   Status Achieved     OT LONG TERM GOAL #5   Title Pt to remain infection free throughout CDT course to limit infection and LE progression.   Baseline dependent   Time 12   Period Weeks   Status On-going     OT LONG TERM GOAL #6   Title During Management Phase CDT Pt to sustain limb volume reductions achieved during Intensive Phase CDT within 5% utilizing LE self-care protocols, appropriate compression garments/ devices, and  moderate caregiver assistance.   Baseline dependent   Time 6   Status On-going               Plan - 11/11/16 1056    Clinical Impression Statement LLE condition continues to improve. Sweeling is decreasing more slowly at this stage of Rx and appears to approaching clinical plateau. Pt is tolerating all aspects of therapy well and is compliant with most home program components with her mom's help. Cont as per POC. Awaiting garment fitting.   Rehab Potential Good   Clinical Impairments Affecting Rehab Potential see SUBJECTIVE section for list of functional impairments 2/2 BLE LE   OT Frequency 3x / week   OT Duration 12 weeks   OT Treatment/Interventions Self-care/ADL training;Therapeutic exercise;Patient/family education;Manual Therapy;Manual lymph drainage;Therapeutic exercises;DME and/or AE instruction;Compression bandaging;Therapeutic activities;Scar mobilization;Other (comment)  skin care to limit infection risk and improve flexibility/ AROM      Patient will benefit from skilled therapeutic intervention in order to improve the following deficits and impairments:  Abnormal gait, Decreased skin integrity, Decreased knowledge of precautions, Decreased scar mobility, Impaired perceived functional ability, Improper body mechanics, Decreased activity tolerance, Decreased knowledge of use of DME, Impaired flexibility, Decreased balance, Difficulty walking, Obesity, Decreased range of motion, Increased edema, Pain  Visit Diagnosis: Lymphedema    Problem List Patient Active Problem List   Diagnosis Date Noted  . Episodic tension-type headache, not intractable 07/22/2015  . Hydrocephalus, adult 09/10/2014  . Glaucoma 08/22/2014  . Preventative health care 05/18/2011  . Lymphedema 08/06/2006    Andrey Spearman, MS, OTR/L, [facility] 11/11/16 11:00 AM  [place] MAIN [facility] SERVICES [address] [place], [place], [address] Phone: [phone]    Fax:  [phone]  Name: Alison Fox MRN: [id number] Date of Birth: 03-10-1971

## 2016-11-13 ENCOUNTER — Telehealth: Payer: Self-pay | Admitting: *Deleted

## 2016-11-13 ENCOUNTER — Ambulatory Visit: Payer: Medicare Other | Admitting: Occupational Therapy

## 2016-11-13 DIAGNOSIS — I890 Lymphedema, not elsewhere classified: Secondary | ICD-10-CM | POA: Diagnosis not present

## 2016-11-13 NOTE — Telephone Encounter (Signed)
OT  reg, theresa calls and states that pt is c/o pain tenderness in her R thigh and calf, she has not had any recent falls or injuries I just at 1208 was able to speak to pt + for pinker in color of the R leg than the L + for warmer than the L + for more pain when she pulls moves toes toward her knee She is strongly cautioned since she states she cannot come to clinic today at 1315 and will come Monday 1/29 at 0800 that if she has: Shortness of breath, any at all, to go to ED Chest pain to go to ED Becomes very weak to go to ED Feels that her heart is beating more rapidly than normal She is agreeable with these instructions and repeats them back

## 2016-11-13 NOTE — Patient Instructions (Signed)
Pt agrees with OT recommendation to check in w/ MD office today re Doppler study to rule out possible DVT. OT spoke w/ triage RN, Myriam JacobsonHelen, and reported sign / symptoms.

## 2016-11-13 NOTE — Therapy (Signed)
[place] MAIN [facility] SERVICES [address] [place], [place], [address] Phone: [phone]   Fax:  [phone]  Occupational Therapy Treatment Note and Progress Report  Patient Details  Name: Alison Fox MRN: [id number] Date of Birth: [birth year] No Data Recorded  Encounter Date: 11/13/2016      OT End of Session - 11/13/16 1058    Visit Number 20   Number of Visits 36   Date for OT Re-Evaluation 11/30/16   OT Start Time 0906   OT Stop Time 1009   OT Time Calculation (min) 63 min   Activity Tolerance Patient tolerated treatment well;No increased pain   Behavior During Therapy WFL for tasks assessed/performed      Past Medical History:  Diagnosis Date  . Glaucoma 08/22/2014  . Headache   . 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 On 01/21/15, she saw Dr. Kathyrn Sheriff at [facility].  She had a repeat MRI of the brain without contrast which demonstrated symmetric ventriculomegaly of the lateral ventricles and third ventricle without significant transependymal flow.  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  Endoscopic third ventriculostomy was offered but not insisted since her only symptoms are headache, which is now mild.  She did not wish to proceed with this procedure.  . Lymphedema    Chronic following left knee surgery in 2000.  . Varicose veins     Past Surgical History:  Procedure Laterality Date  . KNEE ARTHROSCOPY  2000   Left knee.     There were no vitals filed for this visit.      Subjective Assessment - 11/13/16 0914    Subjective  Pt presnts for OT visit 20 total/ 7 for 2018 of Intensive Phase CDT to treat BLE lymphedema. Pt is accompanied by her mother. Pt c/o ppoint tenderness and "soreness" at L lateral  proximal thigh and L lateral calf. "Whenprecipitating injury. OT s I  wake up it feels like a toothache. It's been hurting for about 2 weeks." Pt denies known precipitating injury.    Patient is accompained by: Family member   Pertinent History onset in 66s and positive family history for maternal grandmother and cousin suggests primary LE Tarda; arthroscopic knee sx ~ 2000?, recent fall w/ head laceration. 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   Limitations difficulty walking, decreased balance, suspected BLE suspected, R>L,     Patient Stated Goals replace my stockings and get the swelling down again.   Currently in Pain? Yes  not numerically rated   Pain Location Leg   Pain Orientation Left   Pain Descriptors / Indicators Sore;Aching;Nagging;Tender   Pain Type Acute pain   Pain Onset 1 to 4 weeks ago   Pain Frequency Constant                      OT Treatments/Exercises (OP) - 11/13/16 0001      ADLs   ADL Education Given Yes     Manual Therapy   Manual Therapy Edema management;Manual Lymphatic Drainage (MLD);Compression Bandaging   Manual Lymphatic Drainage (MLD) Manual lymph drainage (MLD) to LLE in supine utilizing functional inguinal lymph nodes and deep abdominal lymphatics as is customary for  non-cancer related lower extremity LE, including bilateral "short neck" sequence, J strokes to sub and supraclavicular LN, deep abdominal pathways, functional inguinal LN, lower extremity proximal to distal w/ emphasis on medial knee bottleneck and politeal LN. Performed fibrosis technique to B maleoli and distal  legs to address fatty fibrosis. Good tolerance.   Compression Bandaging LLE gradient compression wraps applied circumferentially in gradient configuration from toes to groin as follows: custom toe wrap using 1 and 2" co-wrap under cotton stockinett from toes  to groin; 8 cm x 1 to foot and ankle, 10 cm x 1, then 12 cm x 2- all layered over .04 x 10 cm and 12 cm Rosidol Soft foam from A-G.Placed 1 dense foam kidney, fabricated in clinic today. Behind and slightly below both left maleolii in effort to increase compression to fatty fibrosis in this area.   Other Manual Therapy skin care to BLE                OT Education - 11/13/16 1105    Education provided Yes   Education Details OT educated Pt / mother re signs / symptoms of DVT and importance of ruling out ASAP. Con tinued skilled Pt/caregiver Education  And LE ADL training throughout visit for lymphedema self care, including compression wrapping, compression garment and device wear/care, lymphatic pumping ther ex, simple self-MLD, and skin care. Discussed progress towards goals   Person(s) Educated Patient;Parent(s)   Methods Explanation   Comprehension Verbalized understanding          OT Short Term Goals - 09/01/16 1356      OT SHORT TERM GOAL #1   Title Lymphedema (LE) management/ self-care: Pt able to apply multi layered, gradient compression wraps with MAX caregiver assistance using proper techniques within 2 weeks to achieve optimal limb volume reduction.   Baseline max caregiver assist; caregiver min assist   Time 2   Period Weeks   Status New     OT SHORT TERM GOAL #2   Title Lymphedema (LE) management/ self-care:  Pt to achieve at least 10% LLE limb volume reductions bilaterally during Intensive CDT to limit LE progression, decrease infection and falls risk, to reduce pain/, and to improve safe ambulation and functional mobility.   Baseline max assist   Time 12   Period Weeks   Status New     OT SHORT TERM GOAL #3   Title Lymphedema (LE) management/ self-care:  Pt >/= 85 % compliant with all daily, LE self-care protocols for home program w/ needed level of caregiver assistance , including simple self-manual lymphatic drainage (MLD), skin care, lymphatic pumping the ex,  skin care, and donning/ doffing compression wraps and garments o limit LE progression and further functional decline.     Baseline Max A   Time 12   Period Weeks   Status New     OT SHORT TERM GOAL #4   Title Lymphedema (LE) management/ self-care:  Pt to tolerate daily compression wraps, garments and devices in keeping w/ prescribed wear regime within 1 week of issue date to progress and retain clinical and functional gains and to limit LE progression.   Baseline mod A   Time 12   Period Weeks   Status New     OT SHORT TERM GOAL #5   Title Lymphedema (LE) self-care:  During Management Phase CDT Pt to sustain limb volume reductions achieved during Intensive Phase CDT within 5% utilizing LE self-care protocols, appropriate compression garments/ devices, and needed level of  caregiver assistance.   Baseline Max A   Time 6   Period Months   Status New           OT Long Term Goals - 11-17-16 1106      OT LONG TERM GOAL #1   Title Pt able to correctly apply gradient compression wraps to below knee with moderate caregiver assistance for optimal limb volume reduction and improvement in tissue integrity.   Baseline dependent    Time 2   Period Weeks   Status Achieved     OT LONG TERM GOAL #2   Title Pt to achieve 15% limb volume reduction in LLE, and 10% reduction in RLE by DC to limit lymphedema (LE) progression and infection risk.- NEW GOAL 25%   Baseline dependent   Time 12   Period Weeks   Status Achieved     OT LONG TERM GOAL #3   Title Pt > 100% compliant with all daily LE self-care protocols w/ moderate caregiver assistance, including simple self-manual lymphatic drainage (MLD), skin care, lymphatic pumping therex, and donning/ doffing progression garments.   Baseline dependent   Time 12   Period Weeks   Status Partially Met     OT LONG TERM GOAL #5   Title Pt to remain infection free throughout CDT course to limit infection and LE progression.   Baseline dependent    Time 12   Period Weeks   Status On-going     OT LONG TERM GOAL #6   Title During Management Phase CDT Pt to sustain limb volume reductions achieved during Intensive Phase CDT within 5% utilizing LE self-care protocols, appropriate compression garments/ devices, and moderate caregiver assistance.   Baseline dependent   Time 6   Status On-going               Plan - 11/17/2016 1109    Clinical Impression Statement Pt c/o today re point tenderness for last coiuple of weeks at L proximal lateral thigh and L lateral  leg midway between knee and ankle. OT reported symptoms to referring physician's triage nurse, Bonnita Nasuti, and suggested Doppler to rule out possible DVT. Pt has not presented with any acute increase in leg swelling or skin changes over the last couple of weeks. Lymphedema is well controlled and skin condition continues to improve. Pt has met, or made excellent progress towards all OT goals. She has been measured by DME vendor approved by Medicaid and is awaiting fitting with replacement custom compression garments and HOD devices. CONT as per POC.   Rehab Potential Good   Clinical Impairments Affecting Rehab Potential see SUBJECTIVE section for list of functional impairments 2/2 BLE LE   OT Frequency 3x / week   OT Duration 12 weeks   OT Treatment/Interventions Self-care/ADL training;Therapeutic exercise;Patient/family education;Manual Therapy;Manual lymph drainage;Therapeutic exercises;DME and/or AE instruction;Compression bandaging;Therapeutic activities;Scar mobilization;Other (comment)  skin care to limit infection risk and improve flexibility/ AROM      Patient will benefit from skilled therapeutic intervention in order to improve the following deficits and impairments:  Abnormal gait, Decreased skin integrity, Decreased knowledge of precautions, Decreased scar mobility, Impaired perceived functional ability, Improper body mechanics, Decreased activity tolerance, Decreased  knowledge of use of DME, Impaired flexibility, Decreased balance, Difficulty walking, Obesity, Decreased range of motion, Increased edema, Pain  Visit Diagnosis: Lymphedema      G-Codes - 11-17-16 1107    Functional Assessment Tool Used anatomical and volumetric measurements, clinical judgement, interview, medical hx   Functional Limitation Self  care   Self Care Current Status [phone]) At least 40 percent but less than 60 percent impaired, limited or restricted   Self Care Goal Status ([id number]) At least 20 percent but less than 40 percent impaired, limited or restricted      Problem List Patient Active Problem List   Diagnosis Date Noted  . Episodic tension-type headache, not intractable 07/22/2015  . Hydrocephalus, adult 09/10/2014  . Glaucoma 08/22/2014  . Preventative health care 05/18/2011  . Lymphedema 08/06/2006   Andrey Spearman, MS, OTR/L, CLT-LANA 11/13/16 11:14 AM Dictation #1 [id number]  [place] MAIN [facility] SERVICES [address] [place], [place], [address] Phone: [phone]   Fax:  [phone]  Name: Gessica Jawad MRN: [id number] Date of Birth: 07-16-1971

## 2016-11-16 ENCOUNTER — Ambulatory Visit: Payer: Medicare Other | Admitting: Occupational Therapy

## 2016-11-16 ENCOUNTER — Encounter: Payer: Self-pay | Admitting: Student in an Organized Health Care Education/Training Program

## 2016-11-16 ENCOUNTER — Encounter: Payer: Medicare Other | Admitting: Student in an Organized Health Care Education/Training Program

## 2016-11-17 ENCOUNTER — Ambulatory Visit: Payer: Medicare Other | Admitting: Occupational Therapy

## 2016-11-19 ENCOUNTER — Ambulatory Visit
Payer: Medicare Other | Attending: Student in an Organized Health Care Education/Training Program | Admitting: Occupational Therapy

## 2016-11-19 DIAGNOSIS — I890 Lymphedema, not elsewhere classified: Secondary | ICD-10-CM | POA: Diagnosis not present

## 2016-11-19 NOTE — Patient Instructions (Signed)
11/19/2016 Pt to call DME vendor to check garment status.

## 2016-11-19 NOTE — Therapy (Signed)
[place] MAIN [facility] SERVICES [address] [place], [place], [address] Phone: [phone]   Fax:  [phone]  Occupational Therapy Treatment  Patient Details  Name: Anndrea Mihelich MRN: [id number] Date of Birth: 1971-05-08 No Data Recorded  Encounter Date: 11/19/2016      OT End of Session - 11/19/16 1015    Visit Number 21   Number of Visits 36   Date for OT Re-Evaluation 11/30/16   Authorization Type 21 [id number] / 74   OT Start Time 0915   OT Stop Time 1005   OT Time Calculation (min) 50 min   Activity Tolerance Patient tolerated treatment well;No increased pain   Behavior During Therapy WFL for tasks assessed/performed      Past Medical History:  Diagnosis Date  . Glaucoma 08/22/2014  . Headache   . 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 On 01/21/15, she saw Dr. Kathyrn Sheriff at [facility].  She had a repeat MRI of the brain without contrast which demonstrated symmetric ventriculomegaly of the lateral ventricles and third ventricle without significant transependymal flow.  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  Endoscopic third ventriculostomy was offered but not insisted since her only symptoms are headache, which is now mild.  She did not wish to proceed with this procedure.  . Lymphedema    Chronic following left knee surgery in 2000.  . Varicose veins     Past Surgical History:  Procedure Laterality Date  . KNEE ARTHROSCOPY  2000   Left knee.     There were no vitals filed for this visit.      Subjective Assessment - 11/19/16 0924    Subjective  (P)  Pt presnts for OT visit 21/ total (8 for 2018) of Intensive Phase CDT to treat BLE lymphedema. Pt is accompanied by her mother. Pt denies LLE pain. Pt c/o soreness at medial R leg (Not numerically rated.)   Patient is accompained by: (P)   Family member   Pertinent History (P)  onset in 80s and positive family history for maternal grandmother and cousin suggests primary LE Tarda; arthroscopic knee sx ~ 2000?, recent fall w/ head laceration. 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   Limitations (P)  difficulty walking, decreased balance, suspected BLE suspected, R>L,     Patient Stated Goals (P)  replace my stockings and get the swelling down again.   Currently in Pain? (P)  Yes  saee subjective   Pain Onset (P)  1 to 4 weeks ago                              OT Education - 11/19/16 1015    Education provided Yes   Education Details Con tinued Sports administrator  And LE ADL training throughout visit for lymphedema self care, including compression wrapping, compression garment and device wear/care, lymphatic pumping ther ex, simple self-MLD, and skin care. Discussed progress towards goals.   Person(s) Educated Patient   Methods Explanation;Demonstration   Comprehension Verbalized understanding;Need further instruction;Returned demonstration          OT Short Term Goals - 09/01/16 1356      OT  SHORT TERM GOAL #1   Title Lymphedema (LE) management/ self-care: Pt able to apply multi layered, gradient compression wraps with MAX caregiver assistance using proper techniques within 2 weeks to achieve optimal limb volume reduction.   Baseline max caregiver assist; caregiver min assist   Time 2   Period Weeks   Status New     OT SHORT TERM GOAL #2   Title Lymphedema (LE) management/ self-care:  Pt to achieve at least 10% LLE limb volume reductions bilaterally during Intensive CDT to limit LE progression, decrease infection and falls risk, to reduce pain/, and to improve safe ambulation and functional mobility.    Baseline max assist   Time 12   Period Weeks   Status New     OT SHORT TERM GOAL #3   Title Lymphedema (LE) management/ self-care:  Pt >/= 85 % compliant with all daily, LE self-care protocols for home program w/ needed level of caregiver assistance , including simple self-manual lymphatic drainage (MLD), skin care, lymphatic pumping the ex, skin care, and donning/ doffing compression wraps and garments o limit LE progression and further functional decline.     Baseline Max A   Time 12   Period Weeks   Status New     OT SHORT TERM GOAL #4   Title Lymphedema (LE) management/ self-care:  Pt to tolerate daily compression wraps, garments and devices in keeping w/ prescribed wear regime within 1 week of issue date to progress and retain clinical and functional gains and to limit LE progression.   Baseline mod A   Time 12   Period Weeks   Status New     OT SHORT TERM GOAL #5   Title Lymphedema (LE) self-care:  During Management Phase CDT Pt to sustain limb volume reductions achieved during Intensive Phase CDT within 5% utilizing LE self-care protocols, appropriate compression garments/ devices, and needed level of caregiver assistance.   Baseline Max A   Time 6   Period Months   Status New           OT Long Term Goals - 11/13/16 1106      OT LONG TERM GOAL #1   Title Pt able to correctly apply gradient compression wraps to below knee with moderate caregiver assistance for optimal limb volume reduction and improvement in tissue integrity.   Baseline dependent    Time 2   Period Weeks   Status Achieved     OT LONG TERM GOAL #2   Title Pt to achieve 15% limb volume reduction in LLE, and 10% reduction in RLE by DC to limit lymphedema (LE) progression and infection risk.- NEW GOAL 25%   Baseline dependent   Time 12   Period Weeks   Status Achieved     OT LONG TERM GOAL #3   Title Pt > 100% compliant with all daily LE self-care protocols w/ moderate caregiver assistance, including  simple self-manual lymphatic drainage (MLD), skin care, lymphatic pumping therex, and donning/ doffing progression garments.   Baseline dependent   Time 12   Period Weeks   Status Partially Met     OT LONG TERM GOAL #5   Title Pt to remain infection free throughout CDT course to limit infection and LE progression.   Baseline dependent   Time 12   Period Weeks   Status On-going     OT LONG TERM GOAL #6   Title During Management Phase CDT Pt to sustain limb volume reductions achieved during Intensive Phase CDT  within 5% utilizing LE self-care protocols, appropriate compression garments/ devices, and moderate caregiver assistance.   Baseline dependent   Time 6   Status On-going               Plan - 11/19/16 1016    Clinical Impression Statement LLE is very well decongested and continues to decrease in density. Pt able to purchase smaller size shoes  to decrease falls risk. Pt managing very well between visits w/ mother's help. Pt instructed to call DME provider re garment status. Pt instructed to leave VM PRN. It''s likely that garments will need to be remeasured and remade as she has lost substatial additional limb volume since initial garment measurements.   Rehab Potential Good   Clinical Impairments Affecting Rehab Potential see SUBJECTIVE section for list of functional impairments 2/2 BLE LE   OT Frequency 3x / week   OT Duration 12 weeks   OT Treatment/Interventions Self-care/ADL training;Therapeutic exercise;Patient/family education;Manual Therapy;Manual lymph drainage;Therapeutic exercises;DME and/or AE instruction;Compression bandaging;Therapeutic activities;Scar mobilization;Other (comment)  skin care to limit infection risk and improve flexibility/ AROM      Patient will benefit from skilled therapeutic intervention in order to improve the following deficits and impairments:  Abnormal gait, Decreased skin integrity, Decreased knowledge of precautions, Decreased scar  mobility, Impaired perceived functional ability, Improper body mechanics, Decreased activity tolerance, Decreased knowledge of use of DME, Impaired flexibility, Decreased balance, Difficulty walking, Obesity, Decreased range of motion, Increased edema, Pain, Improper spinal/pelvic alignment  Visit Diagnosis: Lymphedema    Problem List Patient Active Problem List   Diagnosis Date Noted  . Episodic tension-type headache, not intractable 07/22/2015  . Hydrocephalus, adult 09/10/2014  . Glaucoma 08/22/2014  . Preventative health care 05/18/2011  . Lymphedema 08/06/2006    Andrey Spearman, MS, OTR/L, [facility] 11/19/16 10:21 AM  [place] MAIN [facility] SERVICES [address] [place], [place], [address] Phone: [phone]   Fax:  [phone]  Name: Montasia Chisenhall MRN: [id number] Date of Birth: 09/19/1971

## 2016-11-23 ENCOUNTER — Ambulatory Visit: Payer: Medicare Other | Admitting: Occupational Therapy

## 2016-11-25 ENCOUNTER — Ambulatory Visit: Payer: Medicare Other | Admitting: Occupational Therapy

## 2016-11-25 DIAGNOSIS — I890 Lymphedema, not elsewhere classified: Secondary | ICD-10-CM

## 2016-11-25 NOTE — Therapy (Signed)
[place] MAIN [facility] SERVICES [address] [place], [place], [address] Phone: [phone]   Fax:  [phone]  Occupational Therapy Treatment  Patient Details  Name: Alison Fox MRN: [id number] Date of Birth: 1971-07-10 No Data Recorded  Encounter Date: 11/25/2016      OT End of Session - 11/25/16 1425    Visit Number 22   Number of Visits 36   Date for OT Re-Evaluation 11/30/16   Authorization Type 22/ (8)   Activity Tolerance Patient tolerated treatment well;No increased pain   Behavior During Therapy WFL for tasks assessed/performed      Past Medical History:  Diagnosis Date  . Glaucoma 08/22/2014  . Headache   . 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 On 01/21/15, she saw Dr. Kathyrn Sheriff at [facility].  She had a repeat MRI of the brain without contrast which demonstrated symmetric ventriculomegaly of the lateral ventricles and third ventricle without significant transependymal flow.  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  Endoscopic third ventriculostomy was offered but not insisted since her only symptoms are headache, which is now mild.  She did not wish to proceed with this procedure.  . Lymphedema    Chronic following left knee surgery in 2000.  . Varicose veins     Past Surgical History:  Procedure Laterality Date  . KNEE ARTHROSCOPY  2000   Left knee.     There were no vitals filed for this visit.      Subjective Assessment - 11/25/16 1416    Subjective  Pt presnts for OT visit 22 total/ 8 for 2018 of Intensive Phase CDT to treat BLE lymphedema. Pt is accompanied by her mother. Pt's mother reports DME providers will return this coming Monday to remeasure Pt for compression garments. Pt reports DME provider initialy fitted a pair of garments a couple of weeks ago that were  knee high only and too small, vs garments we ordered. Parent in agreement with plan to be present at home ftting of possible in order to assist Pt w/ assessment of fit in the home.   Patient is accompained by: Family member   Pertinent History onset in 13s and positive family history for maternal grandmother and cousin suggests primary LE Tarda; arthroscopic knee sx ~ 2000?, recent fall w/ head laceration. 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   Limitations difficulty walking, decreased balance, suspected BLE suspected, R>L,     Patient Stated Goals replace my stockings and get the swelling down again.   Currently in Pain? No/denies   Pain Onset 1 to 4 weeks ago   Aggravating Factors  standing, walking, extended sitting in gravity dependent position   Effect of Pain on Daily Activities limits ADLs performance, participation in leisure and productive activities; limits social participation                      OT Treatments/Exercises (OP) - 11/25/16 0001      ADLs   ADL Education Given Yes     Manual Therapy   Manual Therapy Edema management;Manual Lymphatic Drainage (MLD);Compression Bandaging   Manual Lymphatic Drainage (MLD) Manual lymph drainage (MLD) to LLE in supine utilizing functional inguinal  lymph nodes and deep abdominal lymphatics as is customary for non-cancer related lower extremity LE, including bilateral "short neck" sequence, J strokes to sub and supraclavicular LN, deep abdominal pathways, functional inguinal LN, lower extremity proximal to distal w/ emphasis on medial knee bottleneck and politeal LN. Performed fibrosis technique to B maleoli and distal  legs to address fatty fibrosis. Good tolerance.   Compression Bandaging LLE gradient compression wraps applied circumferentially in  gradient configuration from toes to groin as follows: custom toe wrap using 1 and 2" co-wrap under cotton stockinett from toes to groin; 8 cm x 1 to foot and ankle, 10 cm x 1, then 12 cm x 2- all layered over .04 x 10 cm and 12 cm Rosidol Soft foam from A-G.Placed 1 dense foam kidney, fabricated in clinic today. Behind and slightly below both left maleolii in effort to increase compression to fatty fibrosis in this area.   Other Manual Therapy skin care to BLE                OT Education - 11/25/16 1425    Education provided Yes   Education Details Cont skilled OT   training for LE self care and home program   Person(s) Educated Patient;Parent(s)   Methods Explanation   Comprehension Verbalized understanding;Need further instruction          OT Short Term Goals - 09/01/16 1356      OT SHORT TERM GOAL #1   Title Lymphedema (LE) management/ self-care: Pt able to apply multi layered, gradient compression wraps with MAX caregiver assistance using proper techniques within 2 weeks to achieve optimal limb volume reduction.   Baseline max caregiver assist; caregiver min assist   Time 2   Period Weeks   Status New     OT SHORT TERM GOAL #2   Title Lymphedema (LE) management/ self-care:  Pt to achieve at least 10% LLE limb volume reductions bilaterally during Intensive CDT to limit LE progression, decrease infection and falls risk, to reduce pain/, and to improve safe ambulation and functional mobility.   Baseline max assist   Time 12   Period Weeks   Status New     OT SHORT TERM GOAL #3   Title Lymphedema (LE) management/ self-care:  Pt >/= 85 % compliant with all daily, LE self-care protocols for home program w/ needed level of caregiver assistance , including simple self-manual lymphatic drainage (MLD), skin care, lymphatic pumping the ex, skin care, and donning/ doffing compression wraps and garments o limit LE progression and further functional decline.     Baseline Max A   Time  12   Period Weeks   Status New     OT SHORT TERM GOAL #4   Title Lymphedema (LE) management/ self-care:  Pt to tolerate daily compression wraps, garments and devices in keeping w/ prescribed wear regime within 1 week of issue date to progress and retain clinical and functional gains and to limit LE progression.   Baseline mod A   Time 12   Period Weeks   Status New     OT SHORT TERM GOAL #5   Title Lymphedema (LE) self-care:  During Management Phase CDT Pt to sustain limb volume reductions achieved during Intensive Phase CDT within 5% utilizing LE self-care protocols, appropriate compression garments/ devices, and needed level of caregiver assistance.   Baseline Max A   Time 6   Period Months   Status New  OT Long Term Goals - 11/13/16 1106      OT LONG TERM GOAL #1   Title Pt able to correctly apply gradient compression wraps to below knee with moderate caregiver assistance for optimal limb volume reduction and improvement in tissue integrity.   Baseline dependent    Time 2   Period Weeks   Status Achieved     OT LONG TERM GOAL #2   Title Pt to achieve 15% limb volume reduction in LLE, and 10% reduction in RLE by DC to limit lymphedema (LE) progression and infection risk.- NEW GOAL 25%   Baseline dependent   Time 12   Period Weeks   Status Achieved     OT LONG TERM GOAL #3   Title Pt > 100% compliant with all daily LE self-care protocols w/ moderate caregiver assistance, including simple self-manual lymphatic drainage (MLD), skin care, lymphatic pumping therex, and donning/ doffing progression garments.   Baseline dependent   Time 12   Period Weeks   Status Partially Met     OT LONG TERM GOAL #5   Title Pt to remain infection free throughout CDT course to limit infection and LE progression.   Baseline dependent   Time 12   Period Weeks   Status On-going     OT LONG TERM GOAL #6   Title During Management Phase CDT Pt to sustain limb volume reductions  achieved during Intensive Phase CDT within 5% utilizing LE self-care protocols, appropriate compression garments/ devices, and moderate caregiver assistance.   Baseline dependent   Time 6   Status On-going               Plan - 11/25/16 1428    Clinical Impression Statement Pt tolerated all aspects of manual therapy and ADL training for LE care today. Skin is very well hydrated with dry, scaley skin on top of anterior leg scar sloughing off. Skin remains red and cool to the touch below the knee and Stemmer sign remains strongly positive, but skin hydration is best it has been historiically and lymphatic decongestion conmtinues to decrease in LLE. RLE is well managed with knee length custom garment alone.   Rehab Potential Good   Clinical Impairments Affecting Rehab Potential see SUBJECTIVE section for list of functional impairments 2/2 BLE LE   OT Frequency 3x / week   OT Duration 12 weeks   OT Treatment/Interventions Self-care/ADL training;Therapeutic exercise;Patient/family education;Manual Therapy;Manual lymph drainage;Therapeutic exercises;DME and/or AE instruction;Compression bandaging;Therapeutic activities;Scar mobilization;Other (comment)  skin care to limit infection risk and improve flexibility/ AROM      Patient will benefit from skilled therapeutic intervention in order to improve the following deficits and impairments:  Abnormal gait, Decreased skin integrity, Decreased knowledge of precautions, Decreased scar mobility, Impaired perceived functional ability, Improper body mechanics, Decreased activity tolerance, Decreased knowledge of use of DME, Impaired flexibility, Decreased balance, Difficulty walking, Obesity, Decreased range of motion, Increased edema, Pain, Improper spinal/pelvic alignment  Visit Diagnosis: Lymphedema    Problem List Patient Active Problem List   Diagnosis Date Noted  . Episodic tension-type headache, not intractable 07/22/2015  . Hydrocephalus,  adult 09/10/2014  . Glaucoma 08/22/2014  . Preventative health care 05/18/2011  . Lymphedema 08/06/2006    Andrey Spearman, MS, OTR/L, [facility] 11/25/16 2:32 PM  [place] MAIN [facility] SERVICES [address] [place], [place], [address] Phone: [phone]   Fax:  [phone]  Name: Alison Fox MRN: [id number] Date of Birth: [birth year]

## 2016-11-26 ENCOUNTER — Ambulatory Visit: Payer: Medicare Other | Admitting: Occupational Therapy

## 2016-11-27 ENCOUNTER — Ambulatory Visit: Payer: Medicare Other | Admitting: Occupational Therapy

## 2016-11-27 DIAGNOSIS — I890 Lymphedema, not elsewhere classified: Secondary | ICD-10-CM

## 2016-11-27 NOTE — Therapy (Signed)
[place] [facility] [address] [place], [place], [address] Phone: [phone]   Fax:  [phone]  Patient Details  Name: Alison Fox MRN: [id number] Date of Birth: 1970/12/06 Referring Provider:  Tyson AliasVincent, Duncan Thomas,*  Encounter Date: 11/27/2016   Loel Dubonnetheresa Mosetta Ferdinand, MS, OTR/L, [facility] 11/27/16 12:07 PM  [place] [facility] [address] [place], [place], [address] Phone: [phone]   Fax:  [phone]

## 2016-11-30 ENCOUNTER — Ambulatory Visit: Payer: Medicare Other | Admitting: Occupational Therapy

## 2016-11-30 DIAGNOSIS — I890 Lymphedema, not elsewhere classified: Secondary | ICD-10-CM | POA: Diagnosis not present

## 2016-11-30 NOTE — Therapy (Signed)
[place] MAIN [facility] SERVICES [address] [place], [place], [address] Phone: [phone]   Fax:  [phone]  Occupational Therapy Treatment Note and Progress Report Patient Details  Name: Alison Fox MRN: [id number] Date of Birth: Oct 06, 1971 No Data Recorded  Encounter Date: 11/30/2016      OT End of Session - 11/30/16 1034    Visit Number 24   Number of Visits 36   Date for OT Re-Evaluation 11/30/16   Authorization Type 24/36 (visit 10 for 2018)   OT Start Time 0903   OT Stop Time 1010   OT Time Calculation (min) 67 min   Activity Tolerance Patient tolerated treatment well;No increased pain   Behavior During Therapy WFL for tasks assessed/performed      Past Medical History:  Diagnosis Date  . Glaucoma 08/22/2014  . Headache   . 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 On 01/21/15, she saw Dr. Kathyrn Sheriff at [facility].  She had a repeat MRI of the brain without contrast which demonstrated symmetric ventriculomegaly of the lateral ventricles and third ventricle without significant transependymal flow.  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  Endoscopic third ventriculostomy was offered but not insisted since her only symptoms are headache, which is now mild.  She did not wish to proceed with this procedure.  . Lymphedema    Chronic following left knee surgery in 2000.  . Varicose veins     Past Surgical History:  Procedure Laterality Date  . KNEE ARTHROSCOPY  2000   Left knee.     There were no vitals filed for this visit.      Subjective Assessment - 11/30/16 1026    Subjective  Pt presnts for OT visit 24 total for episode, and 10 for 2018 for Intensive Phase CDT to treat BLE lymphedema, L>R.  Pt is accompanied by her mother. Pt reports DME vendor will come to her home to repeat  measurements for BLE custom compression garments (replacements for initial set). OT provided Pt and her mom a copy of garment and HOS device recommended specs to give to vendor to ensure recommended compression garments/ devices are ordered.   Patient is accompained by: Family member   Pertinent History onset in 52s and positive family history for maternal grandmother and cousin suggests primary LE Tarda; arthroscopic knee sx ~ 2000?, recent fall w/ head laceration. 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   Limitations difficulty walking, decreased balance, suspected BLE suspected, R>L,     Patient Stated Goals replace my stockings and get the swelling down again.   Currently in Pain? No/denies   Pain Onset 1 to 4 weeks ago                      OT Treatments/Exercises (OP) - 11/30/16 0001      ADLs   ADL Education Given Yes     Manual Therapy   Manual Therapy Edema management;Manual Lymphatic Drainage (MLD);Compression Bandaging   Manual therapy comments BLE skin care as established   Soft tissue mobilization fibrosis technique at L medial and lateral malleolii, scar massage   Manual Lymphatic Drainage (MLD) Manual lymph drainage (MLD) to LLE in supine  utilizing functional inguinal lymph nodes and deep abdominal lymphatics as is customary for non-cancer related lower extremity LE, including bilateral "short neck" sequence, J strokes to sub and supraclavicular LN, deep abdominal pathways, functional inguinal LN, lower extremity proximal to distal w/ emphasis on medial knee bottleneck and politeal LN. Performed fibrosis technique to B maleoli and distal  legs to address fatty fibrosis. Good tolerance.   Compression Bandaging PT reapplied RLE knee high compression stocking independently p MLD. LLE  gradient compression wraps applied circumferentially in gradient configuration from toes to groin as follows: custom toe wrap using 1 and 2" co-wrap under cotton stockinett from toes to groin; 8 cm x 1 to foot and ankle, 10 cm x 1, then 12 cm x 2- all layered over .04 x 10 cm and 12 cm Rosidol Soft foam from A-G.Placed 1 dense foam kidney, fabricated in clinic today. Behind and slightly below both left maleolii in effort to increase compression to fatty fibrosis in this area.                OT Education - 11/30/16 1032    Education provided Yes   Education Details Reviewed all custom compression garment and device specifications and rational for each. Reviewed importance of periodic follow up assessment and regular garment replacement (q 3-6 months and PRN) to ensure optimal LE management over time. Provided printed handouts   Person(s) Educated Patient;Parent(s)   Methods Explanation;Demonstration;Handout   Comprehension Verbalized understanding          OT Short Term Goals - 09/01/16 1356      OT SHORT TERM GOAL #1   Title Lymphedema (LE) management/ self-care: Pt able to apply multi layered, gradient compression wraps with MAX caregiver assistance using proper techniques within 2 weeks to achieve optimal limb volume reduction.   Baseline max caregiver assist; caregiver min assist   Time 2   Period Weeks   Status New     OT SHORT TERM GOAL #2   Title Lymphedema (LE) management/ self-care:  Pt to achieve at least 10% LLE limb volume reductions bilaterally during Intensive CDT to limit LE progression, decrease infection and falls risk, to reduce pain/, and to improve safe ambulation and functional mobility.   Baseline max assist   Time 12   Period Weeks   Status New     OT SHORT TERM GOAL #3   Title Lymphedema (LE) management/ self-care:  Pt >/= 85 % compliant with all daily, LE self-care protocols for home program w/ needed level of caregiver assistance , including simple  self-manual lymphatic drainage (MLD), skin care, lymphatic pumping the ex, skin care, and donning/ doffing compression wraps and garments o limit LE progression and further functional decline.     Baseline Max A   Time 12   Period Weeks   Status New     OT SHORT TERM GOAL #4   Title Lymphedema (LE) management/ self-care:  Pt to tolerate daily compression wraps, garments and devices in keeping w/ prescribed wear regime within 1 week of issue date to progress and retain clinical and functional gains and to limit LE progression.   Baseline mod A   Time 12   Period Weeks   Status New     OT SHORT TERM GOAL #5   Title Lymphedema (LE) self-care:  During Management Phase CDT Pt to sustain limb volume reductions achieved during Intensive Phase CDT within 5% utilizing LE self-care protocols, appropriate compression garments/ devices, and needed level of caregiver assistance.  Baseline Max A   Time 6   Period Months   Status New           OT Long Term Goals - 11/30/16 1035      OT LONG TERM GOAL #1   Title Pt able to correctly apply gradient compression wraps to below knee with moderate caregiver assistance for optimal limb volume reduction and improvement in tissue integrity. 11/30/16: PT CURRENTLY REQUIRES MAX a X1 TO DON COMPRESSION WRAPS.   Baseline dependent    Time 2   Period Weeks   Status Partially Met     OT LONG TERM GOAL #2   Title Pt to achieve 15% limb volume reduction in LLE, and 10% reduction in RLE by DC to limit lymphedema (LE) progression and infection risk.- NEW GOAL 25%. 11/30/16: PT HAS MET 10% VOLUMETRIC REDUCTION GOAL BILATERALLY , AND, TO DATE, LLE "BELOW THE KNEE" (A-D) VOLUMETRIC REDUCTION ACHIEVED MEASURES 20.76%.  LLE "TOES TO GROIN" (A-G) LIMB VOLUME REDUCTION MEASURES 15.67% SINCE INITIALLY MEASURED FOR EPISODE 2 ON 08/27/16.   Baseline dependent   Time 12   Period Weeks   Status Achieved     OT LONG TERM GOAL #3   Title Pt IS CONSISTENTLY 85 % compliant  with all daily LE self-care protocols w/  MAXIMUM caregiver assistance, including simple self-manual lymphatic drainage (MLD), skin care, lymphatic pumping therex, and donning/ doffing progression garments.. 11/30/16: GOAL MODIFIED TO REFLECT MORE REALISTI, SUSTAINABLE, DEGREE OF SUCCESSFUL LE SELF MANAGEMENT OVER TIME. DECREASED CONSISTENT COMPLIANCE FROM 100% TO 85%, AND PT REQUIRES MAX ASSIST FROM CAREGIVER INSTEAD OF MODERATE ASSISTANCE.    Baseline dependent   Time 12   Period Weeks   Status Achieved     OT LONG TERM GOAL #5   Title Pt to remain infection free throughout CDT course to limit infection and LE progression. 11/30/16: ONGOING GOAL MET. NO INFECTIONS SINCE INITIALLY COMMENCING OT FOR BLE CDT IN 2016   Baseline dependent   Time 12   Period Weeks   Status On-going     OT LONG TERM GOAL #6   Title During Management Phase CDT Pt to sustain limb volume reductions achieved during Intensive Phase CDT within 5% utilizing LE self-care protocols, appropriate compression garments/ devices, and moderate caregiver assistance.   Baseline dependent   Time 6   Status On-going               Plan - 11/30/16 1053    Clinical Impression Statement To date PT HAS MET and exceeded 10% limb volume reduction goals bilaterally. She is making steady progress towards updated 25% LLE reduction goal.She continues to diligently perform all LE self care regimes between visits at home with her mother's max assistance. Delay in replacement custom BLE compression garment and device (OT ordered 10/05/16) measuring and fitting by Medicaid approvced DME vendor has caused a hardship in terms of increased burden of care, but Pt remains engaged in all aspects of therapy. Her mother is a devoted and supportive caregiver. Garments measurements are pending for this afternoon at patients home. OT provided copies of al specifications and recommendations  to be given to vendors to ensure optimal communication in an  effort to expedite fitting.   Rehab Potential Good   Clinical Impairments Affecting Rehab Potential see SUBJECTIVE section for list of functional impairments 2/2 BLE LE   OT Frequency 3x / week   OT Duration 12 weeks   OT Treatment/Interventions Self-care/ADL training;Therapeutic exercise;Patient/family education;Manual Therapy;Manual lymph drainage;Therapeutic exercises;DME  and/or AE instruction;Compression bandaging;Therapeutic activities;Scar mobilization;Other (comment)  skin care to limit infection risk and improve flexibility/ AROM      Patient will benefit from skilled therapeutic intervention in order to improve the following deficits and impairments:  Abnormal gait, Decreased skin integrity, Decreased knowledge of precautions, Decreased scar mobility, Impaired perceived functional ability, Improper body mechanics, Decreased activity tolerance, Decreased knowledge of use of DME, Impaired flexibility, Decreased balance, Difficulty walking, Obesity, Decreased range of motion, Increased edema, Pain, Improper spinal/pelvic alignment  Visit Diagnosis: Lymphedema, not elsewhere classified      G-Codes - 01-Dec-2016 1103    Functional Assessment Tool Used anatomical and volumetric measurements, clinical judgement, interview, medical hx   Functional Limitation Self care   Self Care Current Status (B8377) At least 40 percent but less than 60 percent impaired, limited or restricted   Self Care Goal Status (P3968) At least 40 percent but less than 60 percent impaired, limited or restricted      Problem List Patient Active Problem List   Diagnosis Date Noted  . Episodic tension-type headache, not intractable 07/22/2015  . Hydrocephalus, adult 09/10/2014  . Glaucoma 08/22/2014  . Preventative health care 05/18/2011  . Lymphedema 08/06/2006    Andrey Spearman, MS, OTR/L, [facility] 12-01-16 11:06 AM  [place] MAIN [facility] SERVICES [address]  [place], [place], [address] Phone: [phone]   Fax:  [phone]  Name: Alison Fox MRN: [id number] Date of Birth: [birth year]

## 2016-12-02 ENCOUNTER — Ambulatory Visit: Payer: Medicare Other | Admitting: Occupational Therapy

## 2016-12-02 DIAGNOSIS — I890 Lymphedema, not elsewhere classified: Secondary | ICD-10-CM | POA: Diagnosis not present

## 2016-12-02 NOTE — Patient Instructions (Signed)
12/02/2016:  Launder new repllacement compression garments and wear daily during waking hours for further assessment next session. Put on JoviPak boots in the evening when you change into your pajamas. DO NOT sleep in compression stockings!

## 2016-12-02 NOTE — Therapy (Signed)
[place] MAIN [facility] SERVICES [address] [place], [place], [address] Phone: [phone]   Fax:  [phone]  Occupational Therapy Treatment  Patient Details  Name: Alison Fox MRN: [id number] Date of Birth: [birth year] No Data Recorded  Encounter Date: 12/02/2016      OT End of Session - 12/02/16 1010    Visit Number 25   Number of Visits 36   Date for OT Re-Evaluation 11/30/16   Authorization Type 25/36 (visit 11 for 2018)   OT Start Time 0915   OT Stop Time 1000   OT Time Calculation (min) 45 min   Activity Tolerance Patient tolerated treatment well;No increased pain   Behavior During Therapy WFL for tasks assessed/performed      Past Medical History:  Diagnosis Date  . Glaucoma 08/22/2014  . Headache   . 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 On 01/21/15, she saw Dr. Kathyrn Sheriff at [facility].  She had a repeat MRI of the brain without contrast which demonstrated symmetric ventriculomegaly of the lateral ventricles and third ventricle without significant transependymal flow.  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  Endoscopic third ventriculostomy was offered but not insisted since her only symptoms are headache, which is now mild.  She did not wish to proceed with this procedure.  . Lymphedema    Chronic following left knee surgery in 2000.  . Varicose veins     Past Surgical History:  Procedure Laterality Date  . KNEE ARTHROSCOPY  2000   Left knee.     There were no vitals filed for this visit.      Subjective Assessment - 12/02/16 1005    Subjective  Pt presnts for OT visit 25/ 36  (11 for 2018)  for Intensive Phase CDT to treat BLE lymphedema, L>R.  Pt is accompanied by her mother. Pt brings new replacement compression stockings and JoviPak boots for HOS to clinic for fit  and function assessment.   Patient is accompained by: Family member   Pertinent History onset in 7s and positive family history for maternal grandmother and cousin suggests primary LE Tarda; arthroscopic knee sx ~ 2000?, recent fall w/ head laceration. 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   Limitations difficulty walking, decreased balance, suspected BLE suspected, R>L,     Patient Stated Goals replace my stockings and get the swelling down again.   Pain Onset 1 to 4 weeks ago                      OT Treatments/Exercises (OP) - 12/02/16 0001      ADLs   ADL Education Given Yes     Manual Therapy   Manual Therapy Edema management   Compression Bandaging Fitting and assessment of replacement compression garments and BLE HOS devices                OT Education - 12/02/16 1009    Education provided Yes   Education Details Pt edu for compression garment assessment, wear and care   Person(s) Educated Patient;Parent(s)   Methods Explanation;Demonstration;Tactile cues;Verbal cues;Handout   Comprehension Verbalized understanding;Returned demonstration;Verbal cues required;Tactile cues required;Need further instruction  OT Short Term Goals - 09/01/16 1356      OT SHORT TERM GOAL #1   Title Lymphedema (LE) management/ self-care: Pt able to apply multi layered, gradient compression wraps with MAX caregiver assistance using proper techniques within 2 weeks to achieve optimal limb volume reduction.   Baseline max caregiver assist; caregiver min assist   Time 2   Period Weeks   Status New     OT SHORT TERM GOAL #2   Title Lymphedema (LE) management/ self-care:  Pt to achieve at least 10% LLE limb volume reductions bilaterally during Intensive CDT to limit LE progression,  decrease infection and falls risk, to reduce pain/, and to improve safe ambulation and functional mobility.   Baseline max assist   Time 12   Period Weeks   Status New     OT SHORT TERM GOAL #3   Title Lymphedema (LE) management/ self-care:  Pt >/= 85 % compliant with all daily, LE self-care protocols for home program w/ needed level of caregiver assistance , including simple self-manual lymphatic drainage (MLD), skin care, lymphatic pumping the ex, skin care, and donning/ doffing compression wraps and garments o limit LE progression and further functional decline.     Baseline Max A   Time 12   Period Weeks   Status New     OT SHORT TERM GOAL #4   Title Lymphedema (LE) management/ self-care:  Pt to tolerate daily compression wraps, garments and devices in keeping w/ prescribed wear regime within 1 week of issue date to progress and retain clinical and functional gains and to limit LE progression.   Baseline mod A   Time 12   Period Weeks   Status New     OT SHORT TERM GOAL #5   Title Lymphedema (LE) self-care:  During Management Phase CDT Pt to sustain limb volume reductions achieved during Intensive Phase CDT within 5% utilizing LE self-care protocols, appropriate compression garments/ devices, and needed level of caregiver assistance.   Baseline Max A   Time 6   Period Months   Status New           OT Long Term Goals - 11/30/16 1035      OT LONG TERM GOAL #1   Title Pt able to correctly apply gradient compression wraps to below knee with moderate caregiver assistance for optimal limb volume reduction and improvement in tissue integrity. 11/30/16: PT CURRENTLY REQUIRES MAX a X1 TO DON COMPRESSION WRAPS.   Baseline dependent    Time 2   Period Weeks   Status Partially Met     OT LONG TERM GOAL #2   Title Pt to achieve 15% limb volume reduction in LLE, and 10% reduction in RLE by DC to limit lymphedema (LE) progression and infection risk.- NEW GOAL 25%. 11/30/16: PT HAS MET  10% VOLUMETRIC REDUCTION GOAL BILATERALLY , AND, TO DATE, LLE "BELOW THE KNEE" (A-D) VOLUMETRIC REDUCTION ACHIEVED MEASURES 20.76%.  LLE "TOES TO GROIN" (A-G) LIMB VOLUME REDUCTION MEASURES 15.67% SINCE INITIALLY MEASURED FOR EPISODE 2 ON 08/27/16.   Baseline dependent   Time 12   Period Weeks   Status Achieved     OT LONG TERM GOAL #3   Title Pt IS CONSISTENTLY 85 % compliant with all daily LE self-care protocols w/  MAXIMUM caregiver assistance, including simple self-manual lymphatic drainage (MLD), skin care, lymphatic pumping therex, and donning/ doffing progression garments.. 11/30/16: GOAL MODIFIED TO REFLECT MORE REALISTI, SUSTAINABLE, DEGREE OF SUCCESSFUL LE SELF MANAGEMENT OVER TIME.  DECREASED CONSISTENT COMPLIANCE FROM 100% TO 85%, AND PT REQUIRES MAX ASSIST FROM CAREGIVER INSTEAD OF MODERATE ASSISTANCE.    Baseline dependent   Time 12   Period Weeks   Status Achieved     OT LONG TERM GOAL #5   Title Pt to remain infection free throughout CDT course to limit infection and LE progression. 11/30/16: ONGOING GOAL MET. NO INFECTIONS SINCE INITIALLY COMMENCING OT FOR BLE CDT IN 2016   Baseline dependent   Time 12   Period Weeks   Status On-going     OT LONG TERM GOAL #6   Title During Management Phase CDT Pt to sustain limb volume reductions achieved during Intensive Phase CDT within 5% utilizing LE self-care protocols, appropriate compression garments/ devices, and moderate caregiver assistance.   Baseline dependent   Time 6   Status On-going               Plan - 12/02/16 1011    Rehab Potential Good   Clinical Impairments Affecting Rehab Potential see SUBJECTIVE section for list of functional impairments 2/2 BLE LE   OT Frequency 3x / week   OT Duration 12 weeks   OT Treatment/Interventions Self-care/ADL training;Therapeutic exercise;Patient/family education;Manual Therapy;Manual lymph drainage;Therapeutic exercises;DME and/or AE instruction;Compression  bandaging;Therapeutic activities;Scar mobilization;Other (comment)  skin care to limit infection risk and improve flexibility/ AROM   Plan Emphasis of OT session today on fitting BLE, custom compression stockings for full-time daily use and JoviPak knee length convoluted foam boots for HOS. Pt able to donn and doff all garments and devices by end of session with skilled training, assistive devices ( friction gloves and mat) and extra time. BLE JoviPaks fit well. Pt agrees to use them as directed, despite being somewhat reluctant. Knee length RLE garments appear to fit well. LLE thigh length garment appears to fit well overall, except for being ~5 cm too long. This is apparent when initally noting top edge sliding down and garment bunching behind knee and at ankle. Pt instructed to wash and wear compression garments daily during visit interval to more thouroughly assess at next session. OT emailed DME vendor re plan and problem list.   Recommended Other Services LLE: Jobst Elvarex thigh high (A-G) , ccl3, flat knit, T-heel, open toe, 5 cm top silicone band for waking hours; JoviPak knee high (A-D) w/ medial zipper, power sleeve and safety sock for HOS. RLE: Jobst Elvarex ccl 2, A-D, w/ T heel. open toe, and 2.5 cm silicone band on top; Jovi A-D for HOS.   Consulted and Agree with Plan of Care Patient;Family member/caregiver      Patient will benefit from skilled therapeutic intervention in order to improve the following deficits and impairments:  Abnormal gait, Decreased skin integrity, Decreased knowledge of precautions, Decreased scar mobility, Impaired perceived functional ability, Improper body mechanics, Decreased activity tolerance, Decreased knowledge of use of DME, Impaired flexibility, Decreased balance, Difficulty walking, Obesity, Decreased range of motion, Increased edema, Pain, Improper spinal/pelvic alignment  Visit Diagnosis: Lymphedema, not elsewhere classified    Problem List Patient  Active Problem List   Diagnosis Date Noted  . Episodic tension-type headache, not intractable 07/22/2015  . Hydrocephalus, adult 09/10/2014  . Glaucoma 08/22/2014  . Preventative health care 05/18/2011  . Lymphedema 08/06/2006    Andrey Spearman, MS, OTR/L, [facility] 12/02/16 10:21 AM   [place] MAIN [facility] SERVICES [address] [place], [place], [address] Phone: [phone]   Fax:  [phone]  Name: Alison Fox MRN:  [id number] Date of Birth: 08-Jun-1971

## 2016-12-04 ENCOUNTER — Ambulatory Visit: Payer: Medicare Other | Admitting: Occupational Therapy

## 2016-12-04 DIAGNOSIS — I890 Lymphedema, not elsewhere classified: Secondary | ICD-10-CM | POA: Diagnosis not present

## 2016-12-07 ENCOUNTER — Ambulatory Visit: Payer: Medicare Other | Admitting: Occupational Therapy

## 2016-12-07 DIAGNOSIS — I890 Lymphedema, not elsewhere classified: Secondary | ICD-10-CM | POA: Diagnosis not present

## 2016-12-07 NOTE — Therapy (Signed)
[place] MAIN [facility] SERVICES [address] [place], [place], [address] Phone: [phone]   Fax:  [phone]  Occupational Therapy Treatment  Patient Details  Name: Alison Fox MRN: [id number] Date of Birth: 07-13-1971 No Data Recorded  Encounter Date: 12/07/2016      OT End of Session - 12/07/16 1033    Visit Number 27   Number of Visits 36   Date for OT Re-Evaluation 11/30/16   Authorization Type 27/36 (visit 33 for 2018)   OT Start Time 0920   OT Stop Time 1020   OT Time Calculation (min) 60 min   Activity Tolerance Patient tolerated treatment well;No increased pain   Behavior During Therapy WFL for tasks assessed/performed      Past Medical History:  Diagnosis Date  . Glaucoma 08/22/2014  . Headache   . 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 On 01/21/15, she saw Dr. Kathyrn Sheriff at [facility].  She had a repeat MRI of the brain without contrast which demonstrated symmetric ventriculomegaly of the lateral ventricles and third ventricle without significant transependymal flow.  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  Endoscopic third ventriculostomy was offered but not insisted since her only symptoms are headache, which is now mild.  She did not wish to proceed with this procedure.  . Lymphedema    Chronic following left knee surgery in 2000.  . Varicose veins     Past Surgical History:  Procedure Laterality Date  . KNEE ARTHROSCOPY  2000   Left knee.     There were no vitals filed for this visit.      Subjective Assessment - 12/07/16 1028    Subjective  Pt presnts for OT visit 27/ 36  (13 for 2018)  for Intensive Phase CDT to treat BLE lymphedema, L>R.  Pt is accompanied by her mother. Pt reports she has to fold knee high down to knee because it continuously falls down at work.  Pt has not yet heard from DME vendor. Pt reports she has not worn HOS devices since first night issued because she is afraid she might slip and fall.   Patient is accompained by: Family member   Pertinent History onset in 67s and positive family history for maternal grandmother and cousin suggests primary LE Tarda; arthroscopic knee sx ~ 2000?, recent fall w/ head laceration. 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   Limitations difficulty walking, decreased balance, suspected BLE suspected, R>L,     Patient Stated Goals replace my stockings and get the swelling down again.   Currently in Pain? No/denies   Pain Onset 1 to 4 weeks ago                      OT Treatments/Exercises (OP) - 12/07/16 1037      Manual Therapy   Manual Therapy Edema management;Manual Lymphatic Drainage (MLD);Compression Bandaging   Manual therapy comments LLE skin care as established   Soft tissue mobilization fibrosis technique at L medial and lateral malleolii, scar massage   Manual Lymphatic Drainage (MLD) Manual lymph drainage (MLD) to LLE in supine utilizing functional inguinal lymph nodes and deep abdominal lymphatics as is customary for non-cancer related lower  extremity LE, including bilateral "short neck" sequence, J strokes to sub and supraclavicular LN, deep abdominal pathways, functional inguinal LN, lower extremity proximal to distal w/ emphasis on medial knee bottleneck and politeal LN. Performed fibrosis technique to B maleoli and distal  legs to address fatty fibrosis. Good tolerance.   Compression Bandaging PT reapplied RLE knee high compression stocking w/ mod A  p MLD. LLE gradient compression wraps applied circumferentially in gradient configuration from toes to groin as follows: custom toe wrap using 1 and 2"  co-wrap under cotton stockinett from toes to groin; 8 cm x 1 to foot and ankle, 10 cm x 1, then 12 cm x 2- all layered over .04 x 10 cm and 12 cm Rosidol Soft foam from A-G.Placed 1 dense foam kidney, fabricated in clinic today. Behind and slightly below both left maleolii in effort to increase compression to fatty fibrosis in this area.                OT Education - 12/07/16 1032    Education provided Yes   Education Details Cont Pt edu for LE self care, including compression garment and device wear and care recommendations.Provided rational and showed photographs of progressive lymphedema to facilitate improved compliance w/ HOS devices.   Person(s) Educated Patient;Parent(s)   Methods Explanation   Comprehension Verbalized understanding;Need further instruction          OT Short Term Goals - 09/01/16 1356      OT SHORT TERM GOAL #1   Title Lymphedema (LE) management/ self-care: Pt able to apply multi layered, gradient compression wraps with MAX caregiver assistance using proper techniques within 2 weeks to achieve optimal limb volume reduction.   Baseline max caregiver assist; caregiver min assist   Time 2   Period Weeks   Status New     OT SHORT TERM GOAL #2   Title Lymphedema (LE) management/ self-care:  Pt to achieve at least 10% LLE limb volume reductions bilaterally during Intensive CDT to limit LE progression, decrease infection and falls risk, to reduce pain/, and to improve safe ambulation and functional mobility.   Baseline max assist   Time 12   Period Weeks   Status New     OT SHORT TERM GOAL #3   Title Lymphedema (LE) management/ self-care:  Pt >/= 85 % compliant with all daily, LE self-care protocols for home program w/ needed level of caregiver assistance , including simple self-manual lymphatic drainage (MLD), skin care, lymphatic pumping the ex, skin care, and donning/ doffing compression wraps and garments o limit LE progression and further functional  decline.     Baseline Max A   Time 12   Period Weeks   Status New     OT SHORT TERM GOAL #4   Title Lymphedema (LE) management/ self-care:  Pt to tolerate daily compression wraps, garments and devices in keeping w/ prescribed wear regime within 1 week of issue date to progress and retain clinical and functional gains and to limit LE progression.   Baseline mod A   Time 12   Period Weeks   Status New     OT SHORT TERM GOAL #5   Title Lymphedema (LE) self-care:  During Management Phase CDT Pt to sustain limb volume reductions achieved during Intensive Phase CDT within 5% utilizing LE self-care protocols, appropriate compression garments/ devices, and needed level of caregiver assistance.   Baseline Max A   Time 6   Period Months   Status New  OT Long Term Goals - 11/30/16 1035      OT LONG TERM GOAL #1   Title Pt able to correctly apply gradient compression wraps to below knee with moderate caregiver assistance for optimal limb volume reduction and improvement in tissue integrity. 11/30/16: PT CURRENTLY REQUIRES MAX a X1 TO DON COMPRESSION WRAPS.   Baseline dependent    Time 2   Period Weeks   Status Partially Met     OT LONG TERM GOAL #2   Title Pt to achieve 15% limb volume reduction in LLE, and 10% reduction in RLE by DC to limit lymphedema (LE) progression and infection risk.- NEW GOAL 25%. 11/30/16: PT HAS MET 10% VOLUMETRIC REDUCTION GOAL BILATERALLY , AND, TO DATE, LLE "BELOW THE KNEE" (A-D) VOLUMETRIC REDUCTION ACHIEVED MEASURES 20.76%.  LLE "TOES TO GROIN" (A-G) LIMB VOLUME REDUCTION MEASURES 15.67% SINCE INITIALLY MEASURED FOR EPISODE 2 ON 08/27/16.   Baseline dependent   Time 12   Period Weeks   Status Achieved     OT LONG TERM GOAL #3   Title Pt IS CONSISTENTLY 85 % compliant with all daily LE self-care protocols w/  MAXIMUM caregiver assistance, including simple self-manual lymphatic drainage (MLD), skin care, lymphatic pumping therex, and donning/ doffing  progression garments.. 11/30/16: GOAL MODIFIED TO REFLECT MORE REALISTI, SUSTAINABLE, DEGREE OF SUCCESSFUL LE SELF MANAGEMENT OVER TIME. DECREASED CONSISTENT COMPLIANCE FROM 100% TO 85%, AND PT REQUIRES MAX ASSIST FROM CAREGIVER INSTEAD OF MODERATE ASSISTANCE.    Baseline dependent   Time 12   Period Weeks   Status Achieved     OT LONG TERM GOAL #5   Title Pt to remain infection free throughout CDT course to limit infection and LE progression. 11/30/16: ONGOING GOAL MET. NO INFECTIONS SINCE INITIALLY COMMENCING OT FOR BLE CDT IN 2016   Baseline dependent   Time 12   Period Weeks   Status On-going     OT LONG TERM GOAL #6   Title During Management Phase CDT Pt to sustain limb volume reductions achieved during Intensive Phase CDT within 5% utilizing LE self-care protocols, appropriate compression garments/ devices, and moderate caregiver assistance.   Baseline dependent   Time 6   Status On-going               Plan - 12/07/16 1038    Clinical Impression Statement Mild increase in LLE swelling and tissue density below the knee due to poor garment fit ( too long and sliding down creasing at ankle and knee). Pt in agreement w/ plan to resume LLE compression wraps until fitting of properly fitting replacement is complete. After MLD and fibrosis techniques swelling decreased   somewhat and density softened, but not to pre-garment degree. Pt will apply compression wraps when she arrives home with mother's assistance. Pt also agreed w plan to utilize  LLE JoviPak for HOS nightly for 7 days to build tolerance and compliance. Pt  agrees w/ plan to mark on her calendar when she is successful. W'll improve compliance w/ LLE device initially , then introduce RLE device. Will positively reinforce at each visiyt.   Rehab Potential Good   Clinical Impairments Affecting Rehab Potential see SUBJECTIVE section for list of functional impairments 2/2 BLE LE   OT Frequency 3x / week   OT Duration 12 weeks    OT Treatment/Interventions Self-care/ADL training;Therapeutic exercise;Patient/family education;Manual Therapy;Manual lymph drainage;Therapeutic exercises;DME and/or AE instruction;Compression bandaging;Therapeutic activities;Scar mobilization;Other (comment)  skin care to limit infection risk and improve flexibility/ AROM   Consulted  and Agree with Plan of Care Patient;Family member/caregiver      Patient will benefit from skilled therapeutic intervention in order to improve the following deficits and impairments:  Abnormal gait, Decreased skin integrity, Decreased knowledge of precautions, Decreased scar mobility, Impaired perceived functional ability, Improper body mechanics, Decreased activity tolerance, Decreased knowledge of use of DME, Impaired flexibility, Decreased balance, Difficulty walking, Obesity, Decreased range of motion, Increased edema, Pain, Improper spinal/pelvic alignment  Visit Diagnosis: Lymphedema, not elsewhere classified    Problem List Patient Active Problem List   Diagnosis Date Noted  . Episodic tension-type headache, not intractable 07/22/2015  . Hydrocephalus, adult 09/10/2014  . Glaucoma 08/22/2014  . Preventative health care 05/18/2011  . Lymphedema 08/06/2006    Andrey Spearman, MS, OTR/L, [facility] 12/07/16 10:45 AM  [place] MAIN [facility] SERVICES [address] [place], [place], [address] Phone: [phone]   Fax:  [phone]  Name: Mashell Sieben MRN: [id number] Date of Birth: 1971/09/28

## 2016-12-07 NOTE — Patient Instructions (Signed)

## 2016-12-07 NOTE — Therapy (Signed)
[place] [facility] SERVICES [address] [place], [place], [address] Phone: [phone]   Fax:  [phone]  Occupational Therapy Treatment  Patient Details  Name: Alison Fox MRN: [id number] Date of Birth: 03-Aug-1971 No Data Recorded  Encounter Date: 12/04/2016    Past Medical History:  Diagnosis Date  . Glaucoma 08/22/2014  . Headache   . 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 On 01/21/15, she saw Dr. Kathyrn Sheriff at [facility].  She had a repeat MRI of the brain without contrast which demonstrated symmetric ventriculomegaly of the lateral ventricles and third ventricle without significant transependymal flow.  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  Endoscopic third ventriculostomy was offered but not insisted since her only symptoms are headache, which is now mild.  She did not wish to proceed with this procedure.  . Lymphedema    Chronic following left knee surgery in 2000.  . Varicose veins     Past Surgical History:  Procedure Laterality Date  . KNEE ARTHROSCOPY  2000   Left knee.     There were no vitals filed for this visit.                    OT Treatments/Exercises (OP) - 12/07/16 0001      ADLs   ADL Education Given Yes     Manual Therapy   Manual Therapy Edema management   Compression Bandaging Fitting and assessment of replacement compression garments and BLE HOS devices                OT Education - 12/07/16 0807    Education provided Yes   Education Details Pt and caregiver edu for garment fit and function, wear and care regimes.   Person(s) Educated Patient;Parent(s)   Methods Explanation;Demonstration   Comprehension Verbalized understanding;Need further instruction          OT Short Term Goals - 09/01/16 1356      OT SHORT TERM  GOAL #1   Title Lymphedema (LE) management/ self-care: Pt able to apply multi layered, gradient compression wraps with MAX caregiver assistance using proper techniques within 2 weeks to achieve optimal limb volume reduction.   Baseline max caregiver assist; caregiver min assist   Time 2   Period Weeks   Status New     OT SHORT TERM GOAL #2   Title Lymphedema (LE) management/ self-care:  Pt to achieve at least 10% LLE limb volume reductions bilaterally during Intensive CDT to limit LE progression, decrease infection and falls risk, to reduce pain/, and to improve safe ambulation and functional mobility.   Baseline max assist   Time 12   Period Weeks   Status New     OT SHORT TERM GOAL #3   Title Lymphedema (LE) management/ self-care:  Pt >/= 85 % compliant with all daily, LE self-care protocols for home program w/ needed level of caregiver assistance , including simple self-manual lymphatic drainage (MLD), skin care, lymphatic pumping the ex, skin care, and donning/ doffing compression wraps and garments o limit LE progression and further functional decline.     Baseline Max A   Time 12   Period Weeks   Status New     OT SHORT TERM GOAL #4   Title Lymphedema (LE) management/ self-care:  Pt to tolerate daily compression wraps, garments and devices in keeping w/ prescribed wear regime within  1 week of issue date to progress and retain clinical and functional gains and to limit LE progression.   Baseline mod A   Time 12   Period Weeks   Status New     OT SHORT TERM GOAL #5   Title Lymphedema (LE) self-care:  During Management Phase CDT Pt to sustain limb volume reductions achieved during Intensive Phase CDT within 5% utilizing LE self-care protocols, appropriate compression garments/ devices, and needed level of caregiver assistance.   Baseline Max A   Time 6   Period Months   Status New           OT Long Term Goals - 11/30/16 1035      OT LONG TERM GOAL #1   Title Pt able to  correctly apply gradient compression wraps to below knee with moderate caregiver assistance for optimal limb volume reduction and improvement in tissue integrity. 11/30/16: PT CURRENTLY REQUIRES MAX a X1 TO DON COMPRESSION WRAPS.   Baseline dependent    Time 2   Period Weeks   Status Partially Met     OT LONG TERM GOAL #2   Title Pt to achieve 15% limb volume reduction in LLE, and 10% reduction in RLE by DC to limit lymphedema (LE) progression and infection risk.- NEW GOAL 25%. 11/30/16: PT HAS MET 10% VOLUMETRIC REDUCTION GOAL BILATERALLY , AND, TO DATE, LLE "BELOW THE KNEE" (A-D) VOLUMETRIC REDUCTION ACHIEVED MEASURES 20.76%.  LLE "TOES TO GROIN" (A-G) LIMB VOLUME REDUCTION MEASURES 15.67% SINCE INITIALLY MEASURED FOR EPISODE 2 ON 08/27/16.   Baseline dependent   Time 12   Period Weeks   Status Achieved     OT LONG TERM GOAL #3   Title Pt IS CONSISTENTLY 85 % compliant with all daily LE self-care protocols w/  MAXIMUM caregiver assistance, including simple self-manual lymphatic drainage (MLD), skin care, lymphatic pumping therex, and donning/ doffing progression garments.. 11/30/16: GOAL MODIFIED TO REFLECT MORE REALISTI, SUSTAINABLE, DEGREE OF SUCCESSFUL LE SELF MANAGEMENT OVER TIME. DECREASED CONSISTENT COMPLIANCE FROM 100% TO 85%, AND PT REQUIRES MAX ASSIST FROM CAREGIVER INSTEAD OF MODERATE ASSISTANCE.    Baseline dependent   Time 12   Period Weeks   Status Achieved     OT LONG TERM GOAL #5   Title Pt to remain infection free throughout CDT course to limit infection and LE progression. 11/30/16: ONGOING GOAL MET. NO INFECTIONS SINCE INITIALLY COMMENCING OT FOR BLE CDT IN 2016   Baseline dependent   Time 12   Period Weeks   Status On-going     OT LONG TERM GOAL #6   Title During Management Phase CDT Pt to sustain limb volume reductions achieved during Intensive Phase CDT within 5% utilizing LE self-care protocols, appropriate compression garments/ devices, and moderate caregiver  assistance.   Baseline dependent   Time 6   Status On-going             Patient will benefit from skilled therapeutic intervention in order to improve the following deficits and impairments:  Abnormal gait, Decreased skin integrity, Decreased knowledge of precautions, Decreased scar mobility, Impaired perceived functional ability, Improper body mechanics, Decreased activity tolerance, Decreased knowledge of use of DME, Impaired flexibility, Decreased balance, Difficulty walking, Obesity, Decreased range of motion, Increased edema, Pain, Improper spinal/pelvic alignment  Visit Diagnosis: Lymphedema, not elsewhere classified    Problem List Patient Active Problem List   Diagnosis Date Noted  . Episodic tension-type headache, not intractable 07/22/2015  . Hydrocephalus, adult 09/10/2014  . Glaucoma 08/22/2014  .  Preventative health care 05/18/2011  . Lymphedema 08/06/2006    Andrey Spearman, MS, OTR/L, [facility] 12/07/16 8:15 AM  [place] [facility] SERVICES [address] [place], [place], [address] Phone: [phone]   Fax:  [phone]  Name: Naveh Rickles MRN: [id number] Date of Birth: Feb 26, 1971

## 2016-12-09 ENCOUNTER — Ambulatory Visit: Payer: Medicare Other | Admitting: Occupational Therapy

## 2016-12-09 DIAGNOSIS — I890 Lymphedema, not elsewhere classified: Secondary | ICD-10-CM | POA: Diagnosis not present

## 2016-12-09 NOTE — Therapy (Signed)
[place] [facility] SERVICES [address] [place], [place], [address] Phone: [phone]   Fax:  [phone]  Occupational Therapy Treatment  Patient Details  Name: Alison Fox MRN: [id number] Date of Birth: [birth year] No Data Recorded  Encounter Date: 12/09/2016      OT End of Session - 12/09/16 0948    Visit Number 28   Number of Visits 36   Date for OT Re-Evaluation 11/30/16   Authorization Type 27/36 (visit 73 for 2018)   OT Start Time 0905   OT Stop Time 1000   OT Time Calculation (min) 55 min   Activity Tolerance Patient tolerated treatment well;No increased pain   Behavior During Therapy WFL for tasks assessed/performed      Past Medical History:  Diagnosis Date  . Glaucoma 08/22/2014  . Headache   . 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 On 01/21/15, she saw Dr. Kathyrn Sheriff at [facility].  She had a repeat MRI of the brain without contrast which demonstrated symmetric ventriculomegaly of the lateral ventricles and third ventricle without significant transependymal flow.  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  Endoscopic third ventriculostomy was offered but not insisted since her only symptoms are headache, which is now mild.  She did not wish to proceed with this procedure.  . Lymphedema    Chronic following left knee surgery in 2000.  . Varicose veins     Past Surgical History:  Procedure Laterality Date  . KNEE ARTHROSCOPY  2000   Left knee.     There were no vitals filed for this visit.      Subjective Assessment - 12/09/16 0947    Subjective  Pt presnts for OT visit 28/ 36  (13 for 2018)  for Intensive Phase CDT to treat BLE lymphedema, L>R.  Pt is accompanied by her mother. Pt presents with compression wraps on LLE and new knee high class 2 garment on RLE according  to plan.   Patient is accompained by: Family member   Pertinent History onset in 68s and positive family history for maternal grandmother and cousin suggests primary LE Tarda; arthroscopic knee sx ~ 2000?, recent fall w/ head laceration. 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   Limitations difficulty walking, decreased balance, suspected BLE suspected, R>L,     Patient Stated Goals replace my stockings and get the swelling down again.   Pain Onset 1 to 4 weeks ago                      OT Treatments/Exercises (OP) - 12/09/16 0001      ADLs   ADL Education Given Yes     Manual Therapy   Manual Therapy Edema management   Manual therapy comments LLE skin care as established   Soft tissue mobilization fibrosis technique at L medial and lateral malleolii, scar massage   Manual Lymphatic Drainage (MLD) Manual lymph drainage (MLD) to LLE in supine utilizing functional inguinal lymph nodes and deep abdominal lymphatics as is customary for non-cancer related lower extremity LE, including bilateral "short neck" sequence, J strokes to sub and supraclavicular LN, deep abdominal pathways, functional inguinal LN, lower extremity proximal to distal w/ emphasis  on medial knee bottleneck and politeal LN. Performed fibrosis technique to B maleoli and distal  legs to address fatty fibrosis. Good tolerance.   Compression Bandaging LLE multi layer compression wrap using  gradient technique- as established- on LLE; new ccl 2 knee high compression garment on RLE                OT Education - 12/09/16 0958    Education provided Yes   Education Details Emphasis of self care training today on simple self-MLD. Pt gets general idea of J stroke, but still needs step by step direction to complete entire sequence    Person(s) Educated Patient;Parent(s)   Methods Explanation;Demonstration;Tactile cues;Verbal cues   Comprehension Verbalized understanding;Returned demonstration;Verbal cues required;Tactile cues required;Need further instruction          OT Short Term Goals - 09/01/16 1356      OT SHORT TERM GOAL #1   Title Lymphedema (LE) management/ self-care: Pt able to apply multi layered, gradient compression wraps with MAX caregiver assistance using proper techniques within 2 weeks to achieve optimal limb volume reduction.   Baseline max caregiver assist; caregiver min assist   Time 2   Period Weeks   Status New     OT SHORT TERM GOAL #2   Title Lymphedema (LE) management/ self-care:  Pt to achieve at least 10% LLE limb volume reductions bilaterally during Intensive CDT to limit LE progression, decrease infection and falls risk, to reduce pain/, and to improve safe ambulation and functional mobility.   Baseline max assist   Time 12   Period Weeks   Status New     OT SHORT TERM GOAL #3   Title Lymphedema (LE) management/ self-care:  Pt >/= 85 % compliant with all daily, LE self-care protocols for home program w/ needed level of caregiver assistance , including simple self-manual lymphatic drainage (MLD), skin care, lymphatic pumping the ex, skin care, and donning/ doffing compression wraps and garments o limit LE progression and further functional decline.     Baseline Max A   Time 12   Period Weeks   Status New     OT SHORT TERM GOAL #4   Title Lymphedema (LE) management/ self-care:  Pt to tolerate daily compression wraps, garments and devices in keeping w/ prescribed wear regime within 1 week of issue date to progress and retain clinical and functional gains and to limit LE progression.   Baseline mod A   Time 12   Period Weeks   Status New     OT SHORT TERM GOAL #5   Title Lymphedema (LE) self-care:  During Management Phase CDT Pt to sustain limb volume reductions achieved during  Intensive Phase CDT within 5% utilizing LE self-care protocols, appropriate compression garments/ devices, and needed level of caregiver assistance.   Baseline Max A   Time 6   Period Months   Status New           OT Long Term Goals - 11/30/16 1035      OT LONG TERM GOAL #1   Title Pt able to correctly apply gradient compression wraps to below knee with moderate caregiver assistance for optimal limb volume reduction and improvement in tissue integrity. 11/30/16: PT CURRENTLY REQUIRES MAX a X1 TO DON COMPRESSION WRAPS.   Baseline dependent    Time 2   Period Weeks   Status Partially Met     OT LONG TERM GOAL #2   Title Pt to achieve 15% limb volume reduction in  LLE, and 10% reduction in RLE by DC to limit lymphedema (LE) progression and infection risk.- NEW GOAL 25%. 11/30/16: PT HAS MET 10% VOLUMETRIC REDUCTION GOAL BILATERALLY , AND, TO DATE, LLE "BELOW THE KNEE" (A-D) VOLUMETRIC REDUCTION ACHIEVED MEASURES 20.76%.  LLE "TOES TO GROIN" (A-G) LIMB VOLUME REDUCTION MEASURES 15.67% SINCE INITIALLY MEASURED FOR EPISODE 2 ON 08/27/16.   Baseline dependent   Time 12   Period Weeks   Status Achieved     OT LONG TERM GOAL #3   Title Pt IS CONSISTENTLY 85 % compliant with all daily LE self-care protocols w/  MAXIMUM caregiver assistance, including simple self-manual lymphatic drainage (MLD), skin care, lymphatic pumping therex, and donning/ doffing progression garments.. 11/30/16: GOAL MODIFIED TO REFLECT MORE REALISTI, SUSTAINABLE, DEGREE OF SUCCESSFUL LE SELF MANAGEMENT OVER TIME. DECREASED CONSISTENT COMPLIANCE FROM 100% TO 85%, AND PT REQUIRES MAX ASSIST FROM CAREGIVER INSTEAD OF MODERATE ASSISTANCE.    Baseline dependent   Time 12   Period Weeks   Status Achieved     OT LONG TERM GOAL #5   Title Pt to remain infection free throughout CDT course to limit infection and LE progression. 11/30/16: ONGOING GOAL MET. NO INFECTIONS SINCE INITIALLY COMMENCING OT FOR BLE CDT IN 2016   Baseline  dependent   Time 12   Period Weeks   Status On-going     OT LONG TERM GOAL #6   Title During Management Phase CDT Pt to sustain limb volume reductions achieved during Intensive Phase CDT within 5% utilizing LE self-care protocols, appropriate compression garments/ devices, and moderate caregiver assistance.   Baseline dependent   Time 6   Status On-going               Plan - 12/09/16 1215    Clinical Impression Statement LLE swelling increase observed last session due to poor garment fit is reduced to previous value today after Pt recommenced compression wrapping during visit interval and today. Pt demonstrates improved compliance with HOS device as her mother reports she has worn it nightly since last visit. Pt in agreement with plan to alternate wearing L and R HOS devices to limit falls risk, giving LLE   wear priority. Initial plan is for her to wear LLE boot on weekdays, and switch to RLE device for weekends.   Rehab Potential Good   Clinical Impairments Affecting Rehab Potential see SUBJECTIVE section for list of functional impairments 2/2 BLE LE   OT Frequency 3x / week   OT Duration 12 weeks   OT Treatment/Interventions Self-care/ADL training;Therapeutic exercise;Patient/family education;Manual Therapy;Manual lymph drainage;Therapeutic exercises;DME and/or AE instruction;Compression bandaging;Therapeutic activities;Scar mobilization;Other (comment)  skin care to limit infection risk and improve flexibility/ AROM   Consulted and Agree with Plan of Care Patient;Family member/caregiver      Patient will benefit from skilled therapeutic intervention in order to improve the following deficits and impairments:  Abnormal gait, Decreased skin integrity, Decreased knowledge of precautions, Decreased scar mobility, Impaired perceived functional ability, Improper body mechanics, Decreased activity tolerance, Decreased knowledge of use of DME, Impaired flexibility, Decreased balance,  Difficulty walking, Obesity, Decreased range of motion, Increased edema, Pain, Improper spinal/pelvic alignment  Visit Diagnosis: Lymphedema    Problem List Patient Active Problem List   Diagnosis Date Noted  . Episodic tension-type headache, not intractable 07/22/2015  . Hydrocephalus, adult 09/10/2014  . Glaucoma 08/22/2014  . Preventative health care 05/18/2011  . Lymphedema 08/06/2006    Andrey Spearman, MS, OTR/L, CLT-LANA 12/09/16 12:19 PM  [place]  [place] [facility] SERVICES [place], [place], [address] Phone: [phone]   Fax:  [phone]  Name: Ciela Mahajan MRN: [id number] Date of Birth: 03-08-1971

## 2016-12-11 ENCOUNTER — Ambulatory Visit: Payer: Medicare Other | Admitting: Occupational Therapy

## 2016-12-14 ENCOUNTER — Ambulatory Visit: Payer: Medicare Other | Admitting: Occupational Therapy

## 2016-12-14 DIAGNOSIS — I890 Lymphedema, not elsewhere classified: Secondary | ICD-10-CM | POA: Diagnosis not present

## 2016-12-14 NOTE — Therapy (Signed)
[place] MAIN [facility] SERVICES [address] [place], [place], [address] Phone: [phone]   Fax:  [phone]  Occupational Therapy Treatment  Patient Details  Name: Alison Fox MRN: [id number] Date of Birth: 1971/05/12 No Data Recorded  Encounter Date: 12/14/2016      OT End of Session - 12/14/16 1400    Visit Number 29   Number of Visits 36   Date for OT Re-Evaluation 11/30/16   Authorization Type 27/36 (visit 14 for 2018)   OT Start Time 0105   OT Stop Time 0204   OT Time Calculation (min) 59 min   Activity Tolerance Patient tolerated treatment well;No increased pain   Behavior During Therapy WFL for tasks assessed/performed      Past Medical History:  Diagnosis Date  . Glaucoma 08/22/2014  . Headache   . 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 On 01/21/15, she saw Dr. Kathyrn Sheriff at [facility].  She had a repeat MRI of the brain without contrast which demonstrated symmetric ventriculomegaly of the lateral ventricles and third ventricle without significant transependymal flow.  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  Endoscopic third ventriculostomy was offered but not insisted since her only symptoms are headache, which is now mild.  She did not wish to proceed with this procedure.  . Lymphedema    Chronic following left knee surgery in 2000.  . Varicose veins     Past Surgical History:  Procedure Laterality Date  . KNEE ARTHROSCOPY  2000   Left knee.     There were no vitals filed for this visit.      Subjective Assessment - 12/14/16 1405    Subjective  Pt presnts for OT visit 29/ 36  (14 for 2018)  for Intensive Phase CDT to treat BLE lymphedema, L>R.  Pt is accompanied by her mother. Pt presents with short stretch gradient compression wraps on LLE from toes to groin, and new  knee high class 2 garment on RLE. Pt reports she has not heard from garment vendor since fitting about schedu;ling measuring for LLE remake.   Patient is accompained by: Family member   Pertinent History onset in 87s and positive family history for maternal grandmother and cousin suggests primary LE Tarda; arthroscopic knee sx ~ 2000?, recent fall w/ head laceration. 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   Limitations difficulty walking, decreased balance, suspected BLE suspected, R>L,     Special Tests get the swelling down and keep it down   Patient Stated Goals replace my stockings and get the swelling down again.   Currently in Pain? No/denies   Pain Onset 1 to 4 weeks ago                      OT Treatments/Exercises (OP) - 12/14/16 0001      ADLs   ADL Education Given Yes     Manual Therapy   Manual Therapy Edema management   Manual therapy comments LLE skin care as established   Soft tissue mobilization fibrosis technique at L medial and lateral malleolii, scar massage   Manual Lymphatic Drainage (MLD) Manual lymph drainage (MLD) to LLE in supine utilizing functional inguinal lymph  nodes and deep abdominal lymphatics as is customary for non-cancer related lower extremity LE, including bilateral "short neck" sequence, J strokes to sub and supraclavicular LN, deep abdominal pathways, functional inguinal LN, lower extremity proximal to distal w/ emphasis on medial knee bottleneck and politeal LN. Performed fibrosis technique to B maleoli and distal  legs to address fatty fibrosis. Good tolerance.   Compression Bandaging LLE multi layer compression wrap using  gradient technique- as established- on LLE; new ccl 2 knee high compression garment on RLE                OT Education -  12/14/16 1408    Education provided Yes   Education Details Continued skilled Pt/caregiver education  And LE ADL training throughout visit for lymphedema self care/ home program, including compression wrapping, compression garment and device wear/care, lymphatic pumping ther ex, simple self-MLD, and skin care. Discussed progress towards goals.   Person(s) Educated Patient;Caregiver(s)   Methods Explanation;Demonstration   Comprehension Verbalized understanding;Other (comment);Need further instruction          OT Short Term Goals - 09/01/16 1356      OT SHORT TERM GOAL #1   Title Lymphedema (LE) management/ self-care: Pt able to apply multi layered, gradient compression wraps with MAX caregiver assistance using proper techniques within 2 weeks to achieve optimal limb volume reduction.   Baseline max caregiver assist; caregiver min assist   Time 2   Period Weeks   Status New     OT SHORT TERM GOAL #2   Title Lymphedema (LE) management/ self-care:  Pt to achieve at least 10% LLE limb volume reductions bilaterally during Intensive CDT to limit LE progression, decrease infection and falls risk, to reduce pain/, and to improve safe ambulation and functional mobility.   Baseline max assist   Time 12   Period Weeks   Status New     OT SHORT TERM GOAL #3   Title Lymphedema (LE) management/ self-care:  Pt >/= 85 % compliant with all daily, LE self-care protocols for home program w/ needed level of caregiver assistance , including simple self-manual lymphatic drainage (MLD), skin care, lymphatic pumping the ex, skin care, and donning/ doffing compression wraps and garments o limit LE progression and further functional decline.     Baseline Max A   Time 12   Period Weeks   Status New     OT SHORT TERM GOAL #4   Title Lymphedema (LE) management/ self-care:  Pt to tolerate daily compression wraps, garments and devices in keeping w/ prescribed wear regime within 1 week of issue date to progress  and retain clinical and functional gains and to limit LE progression.   Baseline mod A   Time 12   Period Weeks   Status New     OT SHORT TERM GOAL #5   Title Lymphedema (LE) self-care:  During Management Phase CDT Pt to sustain limb volume reductions achieved during Intensive Phase CDT within 5% utilizing LE self-care protocols, appropriate compression garments/ devices, and needed level of caregiver assistance.   Baseline Max A   Time 6   Period Months   Status New           OT Long Term Goals - 11/30/16 1035      OT LONG TERM GOAL #1   Title Pt able to correctly apply gradient compression wraps to below knee with moderate caregiver assistance for optimal limb volume reduction and improvement in tissue integrity. 11/30/16: PT CURRENTLY REQUIRES MAX  a X1 TO DON COMPRESSION WRAPS.   Baseline dependent    Time 2   Period Weeks   Status Partially Met     OT LONG TERM GOAL #2   Title Pt to achieve 15% limb volume reduction in LLE, and 10% reduction in RLE by DC to limit lymphedema (LE) progression and infection risk.- NEW GOAL 25%. 11/30/16: PT HAS MET 10% VOLUMETRIC REDUCTION GOAL BILATERALLY , AND, TO DATE, LLE "BELOW THE KNEE" (A-D) VOLUMETRIC REDUCTION ACHIEVED MEASURES 20.76%.  LLE "TOES TO GROIN" (A-G) LIMB VOLUME REDUCTION MEASURES 15.67% SINCE INITIALLY MEASURED FOR EPISODE 2 ON 08/27/16.   Baseline dependent   Time 12   Period Weeks   Status Achieved     OT LONG TERM GOAL #3   Title Pt IS CONSISTENTLY 85 % compliant with all daily LE self-care protocols w/  MAXIMUM caregiver assistance, including simple self-manual lymphatic drainage (MLD), skin care, lymphatic pumping therex, and donning/ doffing progression garments.. 11/30/16: GOAL MODIFIED TO REFLECT MORE REALISTI, SUSTAINABLE, DEGREE OF SUCCESSFUL LE SELF MANAGEMENT OVER TIME. DECREASED CONSISTENT COMPLIANCE FROM 100% TO 85%, AND PT REQUIRES MAX ASSIST FROM CAREGIVER INSTEAD OF MODERATE ASSISTANCE.    Baseline dependent    Time 12   Period Weeks   Status Achieved     OT LONG TERM GOAL #5   Title Pt to remain infection free throughout CDT course to limit infection and LE progression. 11/30/16: ONGOING GOAL MET. NO INFECTIONS SINCE INITIALLY COMMENCING OT FOR BLE CDT IN 2016   Baseline dependent   Time 12   Period Weeks   Status On-going     OT LONG TERM GOAL #6   Title During Management Phase CDT Pt to sustain limb volume reductions achieved during Intensive Phase CDT within 5% utilizing LE self-care protocols, appropriate compression garments/ devices, and moderate caregiver assistance.   Baseline dependent   Time 6   Status On-going               Plan - 12/14/16 1412    Clinical Impression Statement Pt presents with mild increases in swelling, redness, skin temperature and tenderness at t anterior, distal LLE. Pt has bandaid over old blister scar, but denies open skin area. Reviewed cellulitis signs / symptoms in depth and provided printed resource outlining treatment recommendations. By end of session symptoms had decreased by 80%. I suspect dependent positioning and less that perfect gradient wrap applied at home contributed to increwased swelling and redness. Pt tolerated all aspects of OT for CDT today, including LLE / LLW MLD, skin care, and toes to groin compression wrapping. Limb swelling continues to fluctuate weekly, but overall condition is siugnificantly improved since Pt returned for treatment. Pt would benefit from a [employer]ile [employer] Flexitouch sequential pneumatic compression device for sustaining clinical gains made during this 2nd repeat Intensive Phase CDT course. Pt's memory and cognitive deficits limit her ability to effectively perform simple self-MLD, one element of essential daily lymphedema self-care medically necessary to sustain clinical gains, to limit progression,  and to limit further functional decline.   Rehab Potential Good   Clinical Impairments Affecting Rehab Potential see  SUBJECTIVE section for list of functional impairments 2/2 BLE LE   OT Frequency 3x / week   OT Duration 12 weeks   OT Treatment/Interventions Self-care/ADL training;Therapeutic exercise;Patient/family education;Manual Therapy;Manual lymph drainage;Therapeutic exercises;DME and/or AE instruction;Compression bandaging;Therapeutic activities;Scar mobilization;Other (comment)  skin care to limit infection risk and improve flexibility/ AROM   Plan short stretch , gradient compression wraps to  LLE 23/ 7 daily. May DC LLE  wraps once custom, ccl 3 ,  thigh high compression garment is remade and  fitted. Pt to wear ccl 2 custom, Jobst elvarex daily during waking hours. She is to use BLE, knee length, JoviPak convoluted compression   boots for HOS to limit fibrosis formation and to facilitate improved lymphatic function during sleep.   OT Home Exercise Plan 2 x daily lymphatic pumping ther ex- BLE- 2 sets of 10 reps. Daily simple self MLD to BLE   Consulted and Agree with Plan of Care Patient;Family member/caregiver      Patient will benefit from skilled therapeutic intervention in order to improve the following deficits and impairments:  Abnormal gait, Decreased skin integrity, Decreased knowledge of precautions, Decreased scar mobility, Impaired perceived functional ability, Improper body mechanics, Decreased activity tolerance, Decreased knowledge of use of DME, Impaired flexibility, Decreased balance, Difficulty walking, Obesity, Decreased range of motion, Increased edema, Pain, Improper spinal/pelvic alignment  Visit Diagnosis: Lymphedema    Problem List Patient Active Problem List   Diagnosis Date Noted  . Episodic tension-type headache, not intractable 07/22/2015  . Hydrocephalus, adult 09/10/2014  . Glaucoma 08/22/2014  . Preventative health care 05/18/2011  . Lymphedema 08/06/2006    Andrey Spearman, MS, OTR/L, [facility] 12/14/16 2:34 PM  [place]  MAIN [facility] SERVICES [address] [place], [place], [address] Phone: [phone]   Fax:  [phone]  Name: Alison Fox MRN: [id number] Date of Birth: 1971-04-09

## 2016-12-14 NOTE — Patient Instructions (Signed)

## 2016-12-21 ENCOUNTER — Ambulatory Visit
Payer: Medicare Other | Attending: Student in an Organized Health Care Education/Training Program | Admitting: Occupational Therapy

## 2016-12-21 ENCOUNTER — Emergency Department: Payer: Medicare Other

## 2016-12-21 ENCOUNTER — Telehealth: Payer: Self-pay | Admitting: *Deleted

## 2016-12-21 ENCOUNTER — Encounter: Payer: Self-pay | Admitting: Emergency Medicine

## 2016-12-21 ENCOUNTER — Emergency Department
Admission: EM | Admit: 2016-12-21 | Discharge: 2016-12-21 | Disposition: A | Discharge status: Home / Self Care | Payer: Medicare Other | Attending: Emergency Medicine | Admitting: Emergency Medicine

## 2016-12-21 DIAGNOSIS — I890 Lymphedema, not elsewhere classified: Secondary | ICD-10-CM | POA: Diagnosis not present

## 2016-12-21 DIAGNOSIS — M7989 Other specified soft tissue disorders: Secondary | ICD-10-CM

## 2016-12-21 DIAGNOSIS — L539 Erythematous condition, unspecified: Secondary | ICD-10-CM

## 2016-12-21 DIAGNOSIS — L03116 Cellulitis of left lower limb: Secondary | ICD-10-CM | POA: Diagnosis not present

## 2016-12-21 LAB — BASIC METABOLIC PANEL
ANION GAP: 4 — AB (ref 5–15)
BUN: 12 mg/dL (ref 6–20)
CALCIUM: 8.6 mg/dL — AB (ref 8.9–10.3)
CHLORIDE: 104 mmol/L (ref 101–111)
CO2: 31 mmol/L (ref 22–32)
Creatinine, Ser: 0.66 mg/dL (ref 0.44–1.00)
GFR calc non Af Amer: >60 mL/min (ref >60)
Glucose, Bld: 109 mg/dL — ABNORMAL HIGH (ref 65–99)
POTASSIUM: 4.1 mmol/L (ref 3.5–5.1)
Sodium: 139 mmol/L (ref 135–145)

## 2016-12-21 LAB — CBC
HEMATOCRIT: 39.1 % (ref 35.0–47.0)
HEMOGLOBIN: 13.3 g/dL (ref 12.0–16.0)
MCH: 29.6 pg (ref 26.0–34.0)
MCHC: 34.1 g/dL (ref 32.0–36.0)
MCV: 86.9 fL (ref 80.0–100.0)
Platelets: 241 10*3/uL (ref 150–440)
RBC: 4.51 MIL/uL (ref 3.80–5.20)
RDW: 13.3 % (ref 11.5–14.5)
WBC: 4.4 10*3/uL (ref 3.6–11.0)

## 2016-12-21 MED ORDER — CEPHALEXIN 500 MG PO CAPS: 500.0000 mg | ORAL_CAPSULE | Freq: Three times a day (TID) | ORAL | 0 refills | Status: DC

## 2016-12-21 NOTE — ED Triage Notes (Signed)
Pt presents with left leg increased swelling and redness over the past week. Pt reports soreness.

## 2016-12-21 NOTE — Patient Instructions (Signed)
12/21/16:  Pt referred to ED for cellulitis check and ultrasound. Pt advised if a cellulitis infection is diagnosed, then NO MLD OR COMPRESSION TO LLE UNTIL 48 HOURS AFTER STARTING ANTIBIOTIC.   IF NO INFECTION , THEN OK TO RESUME ALL LE SELF CARE, INCLUDING APPLYING COMPRESSION WRAPS WHEN SHE GETS HOME  IF DVT IS DETECTED, FOLLOW TREATING PHYSICIAN';S ADVICE AND CALL ME TOMORROW SO WE CAN ADJUST YOUR OT PLAN OF CARE.    Lymphedema Precautions  If you experience atypical shortness of breath, or notice any signs /symptoms of skin infection (aka cellulitis) remove all compression wraps/ garments, discontinue manual lymphatic drainage (MLD), and report symptoms to your physician immediately. Discontinue MLD and compression for 72 hours after you take your first oral antibiotic so not to spread the infection.   Lymphedema Self- Care Instructions  1. EXERCISE: Perform lymphatic pumping there ex 2 x a day. While wearing your compression wraps or garments. Perform 10 reps of each exercise bilaterally and be sure to perform them in order. Don;t skip around!  OMIT PARTIAL SIT UP  2. MLD: Perform simple self-Manual Lymphatic Drainage (MLD) at least once a day as directed.  3. WRAPS: Compression wraps are to be worn 23 hrs/ 7 days/wk during Intensive Phase of Complete Decongestive Therapy (CDT).Building tolerance may take time and practice, so don't get discouraged. If bandages begin to feel tight during periods of inactivity and/or during the night, try performing your exercises to loosen them.   4. GARMENTS: During Management Phase CDT your compression garments are to be worn during waking hours when active. Do NOT sleep in your garments!!   5. PUT YOUR FEET UP! Elevate your feet and legs and feet to the level of your heart whenever you are sitting down.   6. SKIN: Carefully monitor skin condition and perform impeccable hygiene daily. Bathe skin with mild soap and water and apply low pH lotion (aka  Eucerin ) to improve hydration and limit infection risk.    Lymphatic Pumping Exercises:

## 2016-12-21 NOTE — Telephone Encounter (Signed)
Called by op rehab [facility], therapist states she sees pt for BLE lymphedema, today she reports one of pt's legs is swollen, red and hot to touch also painful, she states she is worried that pt has a clot and wants to know what to do. She was ask to send pt vis wh/ch to ED for eval and states no she thinks pt needs to be seen in pcp office, triage spoke to attending dr Heide Sparknarendra and he agrees for pt to be taken vis wh/ch to ED for eval, not to try to get in car for drive to  and be seen at [facility]. The therapist is not sure as to what to do, she is ask to have a transporter take pt to ED for eval, triage nurse called [facility] and spoke to ED chg nurse and made her aware of pt coming from op rehab and reported problem

## 2016-12-21 NOTE — Therapy (Signed)
[place] MAIN [facility] SERVICES [address] [place], [place], [address] Phone: [phone]   Fax:  [phone]  Occupational Therapy Treatment  Patient Details  Name: Alison Fox MRN: [id number] Date of Birth: Jul 12, 1971 No Data Recorded  Encounter Date: 12/21/2016    Past Medical History:  Diagnosis Date  . Glaucoma 08/22/2014  . Headache   . 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 On 01/21/15, she saw Dr. Kathyrn Sheriff at [facility].  She had a repeat MRI of the brain without contrast which demonstrated symmetric ventriculomegaly of the lateral ventricles and third ventricle without significant transependymal flow.  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  Endoscopic third ventriculostomy was offered but not insisted since her only symptoms are headache, which is now mild.  She did not wish to proceed with this procedure.  . Lymphedema    Chronic following left knee surgery in 2000.  . Varicose veins     Past Surgical History:  Procedure Laterality Date  . KNEE ARTHROSCOPY  2000   Left knee.     There were no vitals filed for this visit.      Subjective Assessment - 12/21/16 1630    Subjective  Pt presents for OT visit 30/36 (15 for 2018) for CDT to LLE. Pt is accompanied by her mother today. Pt reports LLE swelling suddenly started to increase last thursday for no known reason. Pt reports she has been wrapping and attempting LE self massage daily as directed, but swelling has worsened instead of decreasing to prior level. Pt referred to [facility]. No lymphedema treatment provided today.   Patient is accompained by: Family member   Pertinent History onset in 13s and positive family history for maternal grandmother and cousin suggests primary LE Tarda; arthroscopic knee sx ~ 2000?, recent fall w/ head  laceration. 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   Limitations difficulty walking, decreased balance, suspected BLE suspected, R>L,     Special Tests get the swelling down and keep it down   Patient Stated Goals replace my stockings and get the swelling down again.   Pain Onset In the past 7 days                                OT Short Term Goals - 09/01/16 1356      OT SHORT TERM GOAL #1   Title Lymphedema (LE) management/ self-care: Pt able to apply multi layered, gradient compression wraps with MAX caregiver assistance using proper techniques within 2 weeks to achieve optimal limb volume reduction.   Baseline max caregiver assist; caregiver min assist   Time 2   Period Weeks   Status New     OT SHORT TERM GOAL #2   Title Lymphedema (LE) management/ self-care:  Pt to achieve at least 10% LLE limb volume reductions bilaterally during Intensive CDT to limit LE progression, decrease infection and falls risk, to reduce pain/, and to improve safe ambulation and functional mobility.   Baseline max assist   Time 12   Period Weeks   Status New     OT SHORT TERM GOAL #3   Title Lymphedema (  LE) management/ self-care:  Pt >/= 85 % compliant with all daily, LE self-care protocols for home program w/ needed level of caregiver assistance , including simple self-manual lymphatic drainage (MLD), skin care, lymphatic pumping the ex, skin care, and donning/ doffing compression wraps and garments o limit LE progression and further functional decline.     Baseline Max A   Time 12   Period Weeks   Status New     OT SHORT TERM GOAL #4   Title Lymphedema (LE) management/ self-care:  Pt to tolerate daily compression wraps, garments and devices in keeping w/ prescribed wear regime within 1 week of  issue date to progress and retain clinical and functional gains and to limit LE progression.   Baseline mod A   Time 12   Period Weeks   Status New     OT SHORT TERM GOAL #5   Title Lymphedema (LE) self-care:  During Management Phase CDT Pt to sustain limb volume reductions achieved during Intensive Phase CDT within 5% utilizing LE self-care protocols, appropriate compression garments/ devices, and needed level of caregiver assistance.   Baseline Max A   Time 6   Period Months   Status New           OT Long Term Goals - 11/30/16 1035      OT LONG TERM GOAL #1   Title Pt able to correctly apply gradient compression wraps to below knee with moderate caregiver assistance for optimal limb volume reduction and improvement in tissue integrity. 11/30/16: PT CURRENTLY REQUIRES MAX a X1 TO DON COMPRESSION WRAPS.   Baseline dependent    Time 2   Period Weeks   Status Partially Met     OT LONG TERM GOAL #2   Title Pt to achieve 15% limb volume reduction in LLE, and 10% reduction in RLE by DC to limit lymphedema (LE) progression and infection risk.- NEW GOAL 25%. 11/30/16: PT HAS MET 10% VOLUMETRIC REDUCTION GOAL BILATERALLY , AND, TO DATE, LLE "BELOW THE KNEE" (A-D) VOLUMETRIC REDUCTION ACHIEVED MEASURES 20.76%.  LLE "TOES TO GROIN" (A-G) LIMB VOLUME REDUCTION MEASURES 15.67% SINCE INITIALLY MEASURED FOR EPISODE 2 ON 08/27/16.   Baseline dependent   Time 12   Period Weeks   Status Achieved     OT LONG TERM GOAL #3   Title Pt IS CONSISTENTLY 85 % compliant with all daily LE self-care protocols w/  MAXIMUM caregiver assistance, including simple self-manual lymphatic drainage (MLD), skin care, lymphatic pumping therex, and donning/ doffing progression garments.. 11/30/16: GOAL MODIFIED TO REFLECT MORE REALISTI, SUSTAINABLE, DEGREE OF SUCCESSFUL LE SELF MANAGEMENT OVER TIME. DECREASED CONSISTENT COMPLIANCE FROM 100% TO 85%, AND PT REQUIRES MAX ASSIST FROM CAREGIVER INSTEAD OF MODERATE ASSISTANCE.     Baseline dependent   Time 12   Period Weeks   Status Achieved     OT LONG TERM GOAL #5   Title Pt to remain infection free throughout CDT course to limit infection and LE progression. 11/30/16: ONGOING GOAL MET. NO INFECTIONS SINCE INITIALLY COMMENCING OT FOR BLE CDT IN 2016   Baseline dependent   Time 12   Period Weeks   Status On-going     OT LONG TERM GOAL #6   Title During Management Phase CDT Pt to sustain limb volume reductions achieved during Intensive Phase CDT within 5% utilizing LE self-care protocols, appropriate compression garments/ devices, and moderate caregiver assistance.   Baseline dependent   Time 6   Status On-going  Plan - 12/21/16 1626    Clinical Impression Statement Pt presenting with acute increase of LLE swellingf below the knee, increased redness, increased skin temperature, and increased tenderness. Pt reporting no known precipitating event.  OT reported signs and symptoms to Pt's PCP, [facility] Evette Doffing, [facility] via phone conversation w/ triage RN, Bonnita Nasuti. PT referred to [facility] ED for evaluation of suspected cellulitis and/ or DVT.  No lymphedema treatment provided today..   Rehab Potential Good   Clinical Impairments Affecting Rehab Potential see SUBJECTIVE section for list of functional impairments 2/2 BLE LE   OT Frequency 3x / week   OT Duration 12 weeks   OT Treatment/Interventions Self-care/ADL training;Therapeutic exercise;Patient/family education;Manual Therapy;Manual lymph drainage;Therapeutic exercises;DME and/or AE instruction;Compression bandaging;Therapeutic activities;Scar mobilization;Other (comment)  skin care to limit infection risk and improve flexibility/ AROM   OT Home Exercise Plan 2 x daily lymphatic pumping ther ex- BLE- 2 sets of 10 reps. Daily simple self MLD to BLE   Consulted and Agree with Plan of Care Patient;Family member/caregiver      Patient will benefit from skilled therapeutic intervention in  order to improve the following deficits and impairments:  Abnormal gait, Decreased skin integrity, Decreased knowledge of precautions, Decreased scar mobility, Impaired perceived functional ability, Improper body mechanics, Decreased activity tolerance, Decreased knowledge of use of DME, Impaired flexibility, Decreased balance, Difficulty walking, Obesity, Decreased range of motion, Increased edema, Pain, Improper spinal/pelvic alignment  Visit Diagnosis: Lymphedema    Problem List Patient Active Problem List   Diagnosis Date Noted  . Episodic tension-type headache, not intractable 07/22/2015  . Hydrocephalus, adult 09/10/2014  . Glaucoma 08/22/2014  . Preventative health care 05/18/2011  . Lymphedema 08/06/2006    Andrey Spearman, MS, OTR/L, [facility] 12/21/16 4:33 PM   [place] MAIN [facility] SERVICES [address] [place], [place], [address] Phone: [phone]   Fax:  [phone]  Name: Alison Fox MRN: [id number] Date of Birth: 08/24/1971

## 2016-12-21 NOTE — ED Provider Notes (Signed)
[facility] Emergency Department Provider Note  ____________________________________________  Time seen: Approximately 5:48 PM  I have reviewed the triage vital signs and the nursing notes.   HISTORY  Chief Complaint Leg Swelling and Cellulitis    HPI Alison Fox is a 46 y.o. female who complains of redness to the left leg this started a few days ago. She has chronic lymphedema of the left lower extremity and reports that it is at its baseline amount of swelling without any acute change. She denies fever chills chest pain shortness of breath or any other acute complaints. She guesses that maybe the redness is increased from baseline but she is not sure. Denies pain.     Past Medical History:  Diagnosis Date  . Glaucoma 08/22/2014  . Headache   . 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 On 01/21/15, she saw Dr. Conchita Paris at [facility].  She had a repeat MRI of the brain without contrast which demonstrated symmetric ventriculomegaly of the lateral ventricles and third ventricle without significant transependymal flow.  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  Endoscopic third ventriculostomy was offered but not insisted since her only symptoms are headache, which is now mild.  She did not wish to proceed with this procedure.  . Lymphedema    Chronic following left knee surgery in 2000.  . Varicose veins      Patient Active Problem List   Diagnosis Date Noted  . Episodic tension-type headache, not intractable 07/22/2015  . Hydrocephalus, adult 09/10/2014  . Glaucoma 08/22/2014  . Preventative health care 05/18/2011  . Lymphedema 08/06/2006     Past Surgical History:  Procedure Laterality Date  . KNEE ARTHROSCOPY  2000   Left knee.      Prior to Admission medications   Medication Sig Start Date End  Date Taking? Authorizing Provider  nortriptyline (PAMELOR) 50 MG capsule Take 2 capsules (100 mg total) by mouth at bedtime. 08/28/16  Yes Adam Mliss Fritz, DO  cephALEXin (KEFLEX) 500 MG capsule Take 1 capsule (500 mg total) by mouth 3 (three) times daily. 12/21/16   Sharman Cheek, MD     Allergies Patient has no known allergies.   Family History  Problem Relation Age of Onset  . Heart disease Maternal Aunt   . Heart disease Maternal Uncle   . Stroke Paternal Uncle   . Cancer Paternal Uncle     Melanoma  . Diabetes Maternal Grandmother   . Diabetes Paternal Grandfather     Social History Social History  Substance Use Topics  . Smoking status: Never Smoker  . Smokeless tobacco: Never Used  . Alcohol use No    Review of Systems  Constitutional:   No fever or chills.  ENT:   No sore throat. No rhinorrhea. Cardiovascular:   No chest pain. Respiratory:   No dyspnea or cough. Gastrointestinal:   Negative for abdominal pain, vomiting and diarrhea.  Genitourinary:   Negative for dysuria or difficulty urinating. Musculoskeletal:   Chronic left leg swelling due to lymphedema. Erythema of the left shin Neurological:   Negative for headaches 10-point ROS otherwise negative.  ____________________________________________   PHYSICAL EXAM:  VITAL SIGNS: ED Triage Vitals  Enc Vitals Group     BP 12/21/16 1520 117/60     Pulse Rate 12/21/16 1520 82     Resp 12/21/16 1520 18     Temp 12/21/16 1520 98 F (  36.7 C)     Temp Source 12/21/16 1520 Oral     SpO2 12/21/16 1520 100 %     Weight 12/21/16 1520 164 lb (74.4 kg)     Height 12/21/16 1520 5\' 9"  (1.753 m)     Head Circumference --      Peak Flow --      Pain Score 12/21/16 1525 5     Pain Loc --      Pain Edu? --      Excl. in GC? --     Vital signs reviewed, nursing assessments reviewed.   Constitutional:   Alert and oriented. Well appearing and in no distress. Eyes:   No scleral icterus. No conjunctival pallor.  PERRL. EOMI.  No nystagmus. ENT   Head:   Normocephalic and atraumatic.   Nose:   No congestion/rhinnorhea. No septal hematoma   Mouth/Throat:   MMM, no pharyngeal erythema. No peritonsillar mass.    Neck:   No stridor. No SubQ emphysema. No meningismus. Hematological/Lymphatic/Immunilogical:   No cervical lymphadenopathy. Cardiovascular:   RRR. Symmetric bilateral radial and DP pulses.  No murmurs.  Respiratory:   Normal respiratory effort without tachypnea nor retractions. Breath sounds are clear and equal bilaterally. No wheezes/rales/rhonchi. Gastrointestinal:   Soft and nontender. Non distended. There is no CVA tenderness.  No rebound, rigidity, or guarding. Genitourinary:   deferred Musculoskeletal:   Doughy pitting edema of the left leg below the knee. There is splotchy redness around the anterior left lower leg without tenderness warm drainage or induration. No fluctuance. No crepitus. No lymphangitis. Other extremity is unremarkable. Neurologic:   Normal speech and language.  CN 2-10 normal. Motor grossly intact. No gross focal neurologic deficits are appreciated.  Skin:    Skin is warm, dry and intact. Erythema as above on the left leg.  No petechiae, purpura, or bullae.  ____________________________________________    LABS (pertinent positives/negatives) (all labs ordered are listed, but only abnormal results are displayed) Labs Reviewed  BASIC METABOLIC PANEL - Abnormal; Notable for the following:       Result Value   Glucose, Bld 109 (*)    Calcium 8.6 (*)    Anion gap 4 (*)    All other components within normal limits  CBC   ____________________________________________   EKG    ____________________________________________    RADIOLOGY  US Venous Img Lower Unilateral Left  Result Date: 12/21/2016 CLINICAL DATA:  Left leg swelling and redness for 1 week EXAM: LEFT LOWER EXTREMITY VENOUS DOPPLER ULTRASOUND TECHNIQUE: Gray-scale sonography with  graded compression, as well as color Doppler and duplex ultrasound were performed to evaluate the lower extremity deep venous systems from the level of the common femoral vein and including the common femoral, femoral, profunda femoral, popliteal and calf veins including the posterior tibial, peroneal and gastrocnemius veins when visible. The superficial great saphenous vein was also interrogated. Spectral Doppler was utilized to evaluate flow at rest and with distal augmentation maneuvers in the common femoral, femoral and popliteal veins. COMPARISON:  None. FINDINGS: Contralateral Common Femoral Vein: Respiratory phasicity is normal and symmetric with the symptomatic side. No evidence of thrombus. Normal compressibility. Common Femoral Vein: No evidence of thrombus. Normal compressibility, respiratory phasicity and response to augmentation. Saphenofemoral Junction: No evidence of thrombus. Normal compressibility and flow on color Doppler imaging. Profunda Femoral Vein: No evidence of thrombus. Normal compressibility and flow on color Doppler imaging. Femoral Vein: No evidence of thrombus. Normal compressibility, respiratory phasicity and response to augmentation. Popliteal  Vein: No evidence of thrombus. Normal compressibility, respiratory phasicity and response to augmentation. Calf Veins: No definite thrombosis, but evaluation is limited secondary to soft tissue edema. Superficial Great Saphenous Vein: No evidence of thrombus. Normal compressibility and flow on color Doppler imaging. Venous Reflux:  None. Other Findings:  None. IMPRESSION: No evidence of deep venous thrombosis of the left lower extremity. Electronically Signed   By: Elige KoHetal  Patel   On: 12/21/2016 17:14    ____________________________________________   PROCEDURES Procedures  ____________________________________________   INITIAL IMPRESSION / ASSESSMENT AND PLAN / ED COURSE  Pertinent labs & imaging results that were available during  my care of the patient were reviewed by me and considered in my medical decision making (see chart for details).  Patient well appearing no acute distress, presents with redness of the left leg, unclear how acute this is. No change in baseline swelling. No evidence of PE. Ultrasound negative for DVT. No fever, no other inflammatory changes. I'll treat her with Keflex for cellulitis, follow-up with primary care. No evidence of osteo-myelitis abscess or necrotizing fasciitis. Vital signs are normal.         ____________________________________________   FINAL CLINICAL IMPRESSION(S) / ED DIAGNOSES  Final diagnoses:  Lymphedema of left leg  Cellulitis of left lower extremity      New Prescriptions   CEPHALEXIN (KEFLEX) 500 MG CAPSULE    Take 1 capsule (500 mg total) by mouth 3 (three) times daily.     Portions of this note were generated with dragon dictation software. Dictation errors may occur despite best attempts at proofreading.    Sharman CheekPhillip Tullio Chausse, MD 12/21/16 [phone]

## 2016-12-21 NOTE — Discharge Instructions (Signed)
The ultrasound of your leg does not show any blood clots.  Take Keflex for skin infection and follow up with your doctor.

## 2016-12-21 NOTE — Telephone Encounter (Signed)
I agree with the plan. Thank you Alison JacobsonHelen

## 2016-12-28 ENCOUNTER — Ambulatory Visit: Payer: Medicare Other | Admitting: Occupational Therapy

## 2016-12-30 ENCOUNTER — Ambulatory Visit: Payer: Medicare Other | Admitting: Occupational Therapy

## 2016-12-30 DIAGNOSIS — I890 Lymphedema, not elsewhere classified: Secondary | ICD-10-CM | POA: Diagnosis not present

## 2016-12-31 NOTE — Therapy (Signed)
[facility] [address] [place], [place], [address] Phone: [phone]   Fax:  [phone]  Occupational Therapy Treatment  Patient Details  Name: Alison Fox MRN: [id number] Date of Birth: 11/18/1970 No Data Recorded  Encounter Date: 12/30/2016      OT End of Session - 12/30/16 0901    Visit Number (P)  30   Number of Visits (P)  36   Date for OT Re-Evaluation (P)  11/30/16   Authorization Type (P)  27/36 (visit 14 for 2018)   Activity Tolerance (P)  Patient tolerated treatment well;No increased pain   Behavior During Therapy (P)  WFL for tasks assessed/performed      Past Medical History:  Diagnosis Date  . Glaucoma 08/22/2014  . Headache   . 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 On 01/21/15, she saw Dr. Nundkumar at [facility].  She had a repeat MRI of the brain without contrast which demonstrated symmetric ventriculomegaly of the lateral ventricles and third ventricle without significant transependymal flow.  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  Endoscopic third ventriculostomy was offered but not insisted since her only symptoms are headache, which is now mild.  She did not wish to proceed with this procedure.  . Lymphedema    Chronic following left knee surgery in 2000.  . Varicose veins     Past Surgical History:  Procedure Laterality Date  . KNEE ARTHROSCOPY  2000   Left knee.     There were no vitals filed for this visit.      Subjective Assessment - 12/30/16 1427    Subjective  Pt presents for OT visit 31/36 (15 for 2018) for CDT to LLE. Pt is accompanied by her mother today. Pt reports she went to [facility] ED as recommended after last visit. She reports she has been taking antibiotic perscribed . DVT ruled. out. Chris Vaughn from [employer] here  today to assist w/ trial of basic compression pump. in effort to obtain recommended [employer] for Pt to assist w/ long term self management of progressive LE.   Patient is accompained by: Family member   Pertinent History onset in 20s and positive family history for maternal grandmother and cousin suggests primary LE Tarda; arthroscopic knee sx ~ 2000?, recent fall w/ head laceration. 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   Limitations difficulty walking, decreased balance, suspected BLE suspected, R>L,     Special Tests get the swelling down and keep it down   Patient Stated Goals replace my stockings and get the swelling down again.   Currently in Pain? No/denies   Pain Onset In the past 7 days                              OT Education - 12/30/16 1442    Education provided Yes   Education Details Pt and parent edu for pros and cons of pneumatic compression device to assist w/ long term LE management. Intro level care and use regime edu. Clinical rational for recomendation for Flexi over basic pump.   Person(s) Educated Patient   Methods Explanation;Demonstration   Comprehension Verbalized   understanding;Need further instruction          OT Short Term Goals - 09/01/16 1356      OT SHORT TERM GOAL #1   Title Lymphedema (LE) management/ self-care: Pt able to apply multi layered, gradient compression wraps with MAX caregiver assistance using proper techniques within 2 weeks to achieve optimal limb volume reduction.   Baseline max caregiver assist; caregiver min assist   Time 2   Period Weeks   Status New     OT SHORT TERM GOAL #2   Title Lymphedema (LE) management/ self-care:  Pt to achieve at least 10% LLE limb volume reductions bilaterally during Intensive CDT to limit LE  progression, decrease infection and falls risk, to reduce pain/, and to improve safe ambulation and functional mobility.   Baseline max assist   Time 12   Period Weeks   Status New     OT SHORT TERM GOAL #3   Title Lymphedema (LE) management/ self-care:  Pt >/= 85 % compliant with all daily, LE self-care protocols for home program w/ needed level of caregiver assistance , including simple self-manual lymphatic drainage (MLD), skin care, lymphatic pumping the ex, skin care, and donning/ doffing compression wraps and garments o limit LE progression and further functional decline.     Baseline Max A   Time 12   Period Weeks   Status New     OT SHORT TERM GOAL #4   Title Lymphedema (LE) management/ self-care:  Pt to tolerate daily compression wraps, garments and devices in keeping w/ prescribed wear regime within 1 week of issue date to progress and retain clinical and functional gains and to limit LE progression.   Baseline mod A   Time 12   Period Weeks   Status New     OT SHORT TERM GOAL #5   Title Lymphedema (LE) self-care:  During Management Phase CDT Pt to sustain limb volume reductions achieved during Intensive Phase CDT within 5% utilizing LE self-care protocols, appropriate compression garments/ devices, and needed level of caregiver assistance.   Baseline Max A   Time 6   Period Months   Status New           OT Long Term Goals - 11/30/16 1035      OT LONG TERM GOAL #1   Title Pt able to correctly apply gradient compression wraps to below knee with moderate caregiver assistance for optimal limb volume reduction and improvement in tissue integrity. 11/30/16: PT CURRENTLY REQUIRES MAX a X1 TO DON COMPRESSION WRAPS.   Baseline dependent    Time 2   Period Weeks   Status Partially Met     OT LONG TERM GOAL #2   Title Pt to achieve 15% limb volume reduction in LLE, and 10% reduction in RLE by DC to limit lymphedema (LE) progression and infection risk.- NEW GOAL 25%. 11/30/16:  PT HAS MET 10% VOLUMETRIC REDUCTION GOAL BILATERALLY , AND, TO DATE, LLE "BELOW THE KNEE" (A-D) VOLUMETRIC REDUCTION ACHIEVED MEASURES 20.76%.  LLE "TOES TO GROIN" (A-G) LIMB VOLUME REDUCTION MEASURES 15.67% SINCE INITIALLY MEASURED FOR EPISODE 2 ON 08/27/16.   Baseline dependent   Time 12   Period Weeks   Status Achieved     OT LONG TERM GOAL #3   Title Pt IS CONSISTENTLY 85 % compliant with all daily LE self-care protocols w/  MAXIMUM caregiver assistance, including simple self-manual lymphatic drainage (MLD), skin care, lymphatic pumping therex, and donning/ doffing progression garments.. 11/30/16: GOAL MODIFIED TO   REFLECT MORE REALISTI, SUSTAINABLE, DEGREE OF SUCCESSFUL LE SELF MANAGEMENT OVER TIME. DECREASED CONSISTENT COMPLIANCE FROM 100% TO 85%, AND PT REQUIRES MAX ASSIST FROM CAREGIVER INSTEAD OF MODERATE ASSISTANCE.    Baseline dependent   Time 12   Period Weeks   Status Achieved     OT LONG TERM GOAL #5   Title Pt to remain infection free throughout CDT course to limit infection and LE progression. 11/30/16: ONGOING GOAL MET. NO INFECTIONS SINCE INITIALLY COMMENCING OT FOR BLE CDT IN 2016   Baseline dependent   Time 12   Period Weeks   Status On-going     OT LONG TERM GOAL #6   Title During Management Phase CDT Pt to sustain limb volume reductions achieved during Intensive Phase CDT within 5% utilizing LE self-care protocols, appropriate compression garments/ devices, and moderate caregiver assistance.   Baseline dependent   Time 6   Status On-going               Plan - 12/31/16 1223    Rehab Potential (P)  Good   Clinical Impairments Affecting Rehab Potential (P)  see SUBJECTIVE section for list of functional impairments 2/2 BLE LE   OT Frequency (P)  3x / week   OT Duration (P)  12 weeks   OT Treatment/Interventions (P)  Self-care/ADL training;Therapeutic exercise;Patient/family education;Manual Therapy;Manual lymph drainage;Therapeutic exercises;DME and/or AE  instruction;Compression bandaging;Therapeutic activities;Scar mobilization;Other (comment)  skin care to limit infection risk and improve flexibility/ AROM   OT Home Exercise Plan (P)  2 x daily lymphatic pumping ther ex- BLE- 2 sets of 10 reps. Daily simple self MLD to BLE   Consulted and Agree with Plan of Care (P)  Patient;Family member/caregiver      Patient will benefit from skilled therapeutic intervention in order to improve the following deficits and impairments:  (P) Abnormal gait, Decreased skin integrity, Decreased knowledge of precautions, Decreased scar mobility, Impaired perceived functional ability, Improper body mechanics, Decreased activity tolerance, Decreased knowledge of use of DME, Impaired flexibility, Decreased balance, Difficulty walking, Obesity, Decreased range of motion, Increased edema, Pain, Improper spinal/pelvic alignment  Visit Diagnosis: Lymphedema, not elsewhere classified    Problem List Patient Active Problem List   Diagnosis Date Noted  . Episodic tension-type headache, not intractable 07/22/2015  . Hydrocephalus, adult 09/10/2014  . Glaucoma 08/22/2014  . Preventative health care 05/18/2011  . Lymphedema 08/06/2006    Theresa Gilliam, MS, OTR/L, CLT-LANA 12/31/16 1:42 PM   [facility] [address] [place], [place], [address] Phone: [phone]   Fax:  [phone]  Name: Rockie Moret MRN: [id number] Date of Birth: 02/07/1971 

## 2017-01-04 ENCOUNTER — Ambulatory Visit: Payer: Medicare Other | Admitting: Occupational Therapy

## 2017-01-04 DIAGNOSIS — I890 Lymphedema, not elsewhere classified: Secondary | ICD-10-CM | POA: Diagnosis not present

## 2017-01-04 NOTE — Therapy (Signed)
[place] MAIN [facility] SERVICES [address] [place], [place], [address] Phone: [phone]   Fax:  [phone]  Occupational Therapy Treatment  Patient Details  Name: Alison Fox MRN: [id number] Date of Birth: 09-08-1971 No Data Recorded  Encounter Date: 01/04/2017      OT End of Session - 01/04/17 1620    Visit Number 31   Number of Visits 55   Date for OT Re-Evaluation 11/30/16   Authorization Type 27/36 (visit 87 for 2018)   OT Start Time 0215   OT Stop Time 0315   OT Time Calculation (min) 60 min   Activity Tolerance Patient tolerated treatment well;No increased pain   Behavior During Therapy WFL for tasks assessed/performed      Past Medical History:  Diagnosis Date  . Glaucoma 08/22/2014  . Headache   . 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 On 01/21/15, she saw Dr. Kathyrn Sheriff at [facility].  She had a repeat MRI of the brain without contrast which demonstrated symmetric ventriculomegaly of the lateral ventricles and third ventricle without significant transependymal flow.  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  Endoscopic third ventriculostomy was offered but not insisted since her only symptoms are headache, which is now mild.  She did not wish to proceed with this procedure.  . Lymphedema    Chronic following left knee surgery in 2000.  . Varicose veins     Past Surgical History:  Procedure Laterality Date  . KNEE ARTHROSCOPY  2000   Left knee.     There were no vitals filed for this visit.      Subjective Assessment - 01/04/17 1432    Subjective  Pt presents for OT visit 31/36 (15 for 2018) for CDT to LLE. Pt is accompanied by her mother today. Pt reports she has finished her oral antibiotic perscription for cellulitis.   Patient is accompained by: Family member   Pertinent History onset in 48s and positive family history for maternal grandmother and cousin suggests primary LE Tarda; arthroscopic knee sx ~ 2000?, recent fall w/ head laceration. 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   Limitations difficulty walking, decreased balance, suspected BLE suspected, R>L,     Special Tests get the swelling down and keep it down   Patient Stated Goals replace my stockings and get the swelling down again.   Currently in Pain? No/denies   Pain Onset In the past 7 days                      OT Treatments/Exercises (OP) - 01/04/17 0001      ADLs   ADL Education Given Yes     Manual Therapy   Manual Therapy Edema management;Manual Lymphatic Drainage (MLD);Compression Bandaging  skin care   Manual therapy comments LLE skin care as established   Soft tissue mobilization fibrosis technique at L medial and lateral malleolii, scar massage   Manual Lymphatic Drainage (MLD) Manual lymph drainage (MLD) to LLE in supine utilizing functional inguinal lymph nodes and deep abdominal lymphatics as is customary for non-cancer related lower extremity LE, including bilateral "short neck" sequence, J strokes to sub and supraclavicular LN, deep abdominal pathways, functional inguinal  LN, lower extremity proximal to distal w/ emphasis on medial knee bottleneck and politeal LN. Performed fibrosis technique to B maleoli and distal  legs to address fatty fibrosis. Good tolerance.   Compression Bandaging LLE multi layer compression wrap using  gradient technique- as established- on LLE; new ccl 2 knee high compression garment on RLE                OT Education - 01/04/17 1619    Education provided Yes   Education Details Continued skilled Pt/caregiver education  And LE ADL training  throughout visit for lymphedema self care/ home program, including compression wrapping, compression garment and device wear/care, lymphatic pumping ther ex, simple self-MLD, and skin care. Discussed progress towards goals.   Person(s) Educated Patient;Parent(s)   Methods Explanation   Comprehension Verbalized understanding          OT Short Term Goals - 09/01/16 1356      OT SHORT TERM GOAL #1   Title Lymphedema (LE) management/ self-care: Pt able to apply multi layered, gradient compression wraps with MAX caregiver assistance using proper techniques within 2 weeks to achieve optimal limb volume reduction.   Baseline max caregiver assist; caregiver min assist   Time 2   Period Weeks   Status New     OT SHORT TERM GOAL #2   Title Lymphedema (LE) management/ self-care:  Pt to achieve at least 10% LLE limb volume reductions bilaterally during Intensive CDT to limit LE progression, decrease infection and falls risk, to reduce pain/, and to improve safe ambulation and functional mobility.   Baseline max assist   Time 12   Period Weeks   Status New     OT SHORT TERM GOAL #3   Title Lymphedema (LE) management/ self-care:  Pt >/= 85 % compliant with all daily, LE self-care protocols for home program w/ needed level of caregiver assistance , including simple self-manual lymphatic drainage (MLD), skin care, lymphatic pumping the ex, skin care, and donning/ doffing compression wraps and garments o limit LE progression and further functional decline.     Baseline Max A   Time 12   Period Weeks   Status New     OT SHORT TERM GOAL #4   Title Lymphedema (LE) management/ self-care:  Pt to tolerate daily compression wraps, garments and devices in keeping w/ prescribed wear regime within 1 week of issue date to progress and retain clinical and functional gains and to limit LE progression.   Baseline mod A   Time 12   Period Weeks   Status New     OT SHORT TERM GOAL #5   Title Lymphedema (LE)  self-care:  During Management Phase CDT Pt to sustain limb volume reductions achieved during Intensive Phase CDT within 5% utilizing LE self-care protocols, appropriate compression garments/ devices, and needed level of caregiver assistance.   Baseline Max A   Time 6   Period Months   Status New           OT Long Term Goals - 11/30/16 1035      OT LONG TERM GOAL #1   Title Pt able to correctly apply gradient compression wraps to below knee with moderate caregiver assistance for optimal limb volume reduction and improvement in tissue integrity. 11/30/16: PT CURRENTLY REQUIRES MAX a X1 TO DON COMPRESSION WRAPS.   Baseline dependent    Time 2   Period Weeks   Status Partially Met     OT LONG TERM GOAL #2  Title Pt to achieve 15% limb volume reduction in LLE, and 10% reduction in RLE by DC to limit lymphedema (LE) progression and infection risk.- NEW GOAL 25%. 11/30/16: PT HAS MET 10% VOLUMETRIC REDUCTION GOAL BILATERALLY , AND, TO DATE, LLE "BELOW THE KNEE" (A-D) VOLUMETRIC REDUCTION ACHIEVED MEASURES 20.76%.  LLE "TOES TO GROIN" (A-G) LIMB VOLUME REDUCTION MEASURES 15.67% SINCE INITIALLY MEASURED FOR EPISODE 2 ON 08/27/16.   Baseline dependent   Time 12   Period Weeks   Status Achieved     OT LONG TERM GOAL #3   Title Pt IS CONSISTENTLY 85 % compliant with all daily LE self-care protocols w/  MAXIMUM caregiver assistance, including simple self-manual lymphatic drainage (MLD), skin care, lymphatic pumping therex, and donning/ doffing progression garments.. 11/30/16: GOAL MODIFIED TO REFLECT MORE REALISTI, SUSTAINABLE, DEGREE OF SUCCESSFUL LE SELF MANAGEMENT OVER TIME. DECREASED CONSISTENT COMPLIANCE FROM 100% TO 85%, AND PT REQUIRES MAX ASSIST FROM CAREGIVER INSTEAD OF MODERATE ASSISTANCE.    Baseline dependent   Time 12   Period Weeks   Status Achieved     OT LONG TERM GOAL #5   Title Pt to remain infection free throughout CDT course to limit infection and LE progression. 11/30/16:  ONGOING GOAL MET. NO INFECTIONS SINCE INITIALLY COMMENCING OT FOR BLE CDT IN 2016   Baseline dependent   Time 12   Period Weeks   Status On-going     OT LONG TERM GOAL #6   Title During Management Phase CDT Pt to sustain limb volume reductions achieved during Intensive Phase CDT within 5% utilizing LE self-care protocols, appropriate compression garments/ devices, and moderate caregiver assistance.   Baseline dependent   Time 6   Status On-going               Plan - 01/04/17 1620    Clinical Impression Statement LLE presents again today with increased redness than has been typical, but all other sighns / symptoms of infection are resolved. Skin temp is cool to the touch. Tissue density is mildly decreased, as is swelling ., since last seen 1 week go,. Pt able to tolerate compression wraps and MLD today w/ no difficulty. Pt reports she has still not heard from   DME vendor re scheuling remeasure for LLE compression garment. DME vendor has not responded to this therapsists emails. Pt agreed to call DME vendor  to schedule measuring session for remake.   Rehab Potential Good   Clinical Impairments Affecting Rehab Potential see SUBJECTIVE section for list of functional impairments 2/2 BLE LE   OT Frequency 3x / week   OT Duration 12 weeks   OT Treatment/Interventions Self-care/ADL training;Therapeutic exercise;Patient/family education;Manual Therapy;Manual lymph drainage;Therapeutic exercises;DME and/or AE instruction;Compression bandaging;Therapeutic activities;Scar mobilization;Other (comment)  skin care to limit infection risk and improve flexibility/ AROM   OT Home Exercise Plan 2 x daily lymphatic pumping ther ex- BLE- 2 sets of 10 reps. Daily simple self MLD to BLE   Consulted and Agree with Plan of Care Patient;Family member/caregiver      Patient will benefit from skilled therapeutic intervention in order to improve the following deficits and impairments:  Abnormal gait,  Decreased skin integrity, Decreased knowledge of precautions, Decreased scar mobility, Impaired perceived functional ability, Improper body mechanics, Decreased activity tolerance, Decreased knowledge of use of DME, Impaired flexibility, Decreased balance, Difficulty walking, Obesity, Decreased range of motion, Increased edema, Pain, Improper spinal/pelvic alignment  Visit Diagnosis: Lymphedema, not elsewhere classified    Problem List Patient Active Problem List  Diagnosis Date Noted  . Episodic tension-type headache, not intractable 07/22/2015  . Hydrocephalus, adult 09/10/2014  . Glaucoma 08/22/2014  . Preventative health care 05/18/2011  . Lymphedema 08/06/2006    Andrey Spearman, MS, OTR/L, [facility] 01/04/17 4:25 PM  [place] MAIN [facility] SERVICES [address] [place], [place], [address] Phone: [phone]   Fax:  [phone]  Name: Alison Fox MRN: [id number] Date of Birth: [birth year]

## 2017-01-06 ENCOUNTER — Ambulatory Visit: Payer: Medicare Other | Admitting: Occupational Therapy

## 2017-01-06 DIAGNOSIS — I890 Lymphedema, not elsewhere classified: Secondary | ICD-10-CM

## 2017-01-06 NOTE — Patient Instructions (Signed)

## 2017-01-06 NOTE — Therapy (Signed)
[place] MAIN [facility] SERVICES [address] [place], [place], [address] Phone: [phone]   Fax:  [phone]  Occupational Therapy Treatment  Patient Details  Name: Alison Fox MRN: [id number] Date of Birth: 02-Mar-1971 No Data Recorded  Encounter Date: 01/06/2017    Past Medical History:  Diagnosis Date  . Glaucoma 08/22/2014  . Headache   . 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 On 01/21/15, she saw Dr. Kathyrn Sheriff at [facility].  She had a repeat MRI of the brain without contrast which demonstrated symmetric ventriculomegaly of the lateral ventricles and third ventricle without significant transependymal flow.  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  Endoscopic third ventriculostomy was offered but not insisted since her only symptoms are headache, which is now mild.  She did not wish to proceed with this procedure.  . Lymphedema    Chronic following left knee surgery in 2000.  . Varicose veins     Past Surgical History:  Procedure Laterality Date  . KNEE ARTHROSCOPY  2000   Left knee.     There were no vitals filed for this visit.      Subjective Assessment - 01/06/17 1419    Subjective  Pt presents for OT visit 32/36 (15 for 2018) for CDT to LLE. Pt is accompanied by her mother today. DME vendor here again today from [employer] to repeat basic pump and Flexitouch trials for additional data.    Patient is accompained by: Family member   Pertinent History onset in 66s and positive family history for maternal grandmother and cousin suggests primary LE Tarda; arthroscopic knee sx ~ 2000?, recent fall w/ head laceration. 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 Completed previous Intensive CDT achieving a remarkable 32.07% limb volume reduction below the knee on  the LLE , and a 7.24% limb volume reduction on the R.   Limitations difficulty walking, decreased balance, suspected BLE suspected, R>L,     Special Tests get the swelling down and keep it down   Patient Stated Goals replace my stockings and get the swelling down again.   Currently in Pain? Yes   Pain Location Leg   Pain Orientation Right;Left   Pain Descriptors / Indicators Tender;Heaviness;Tightness;Sore;Aching;Tiring   Pain Type Chronic pain   Pain Onset More than a month ago   Pain Frequency Intermittent   Aggravating Factors  standing, walking, extended sitting   Pain Relieving Factors compression, elevation   Effect of Pain on Daily Activities limits basic and instrumental ADLs performance, limits functional mobility and ambulation, limits ability to perform work and productive activities, limits participation in leisure poursuits, restricts participation in social and community activities.                      OT Treatments/Exercises (OP) - 01/06/17 0001      ADLs   ADL Education Given Yes     Manual Therapy   Manual Therapy Edema management;Other (comment);Compression Bandaging  trial w/ basic 8 chamber seq. pneumatic pump @ ~ 32 mm Hg   Manual Lymphatic Drainage (MLD) --   Compression Bandaging LLE multi layer compression wrap using  gradient technique- as established- on LLE; new ccl 2 knee high compression garment on RLE   Other Manual Therapy Anatomical measurements LLE PRE TRIAL w/ BASIC PUMP:  B=28.8 cm; C=43.1 cm; E=43; G= 57  cm; ; T=86.5 cm. LLE BASIC PUMP POST TRIAL:  B=28.8 cm ( no change);  C= 44.3 cm (up 1.2 cm) ,  E= 43.5 (up .5 cm) ; G= 57.5cm (up .5 cm), T=88.5 cm ( up 2.0 cm).  LLE POST FLEXITOUCH TRIAL ) B= 28.7 cm ( down .1 cm) C=43. cm ( down 1.3 cm); E= 43 cm ( down .5 cm); G=55.8 cm (down 2.3 cm); T= 86 cm ( down 1.5 cm)                OT Education - 01/06/17 1432    Education provided Yes   Education Details Continued skilled  Pt/caregiver education  And LE ADL training throughout visit for lymphedema self care/ home program, including compression wrapping, compression garment and device wear/care, lymphatic pumping ther ex, simple self-MLD, and skin care. Discussed progress towards goals. Pt and parent educated on sequential pneumatic compression device indications and contra-indications. Educated Pt and parent on differences between basic and Flxitouch devices for long term lymphedema management.   Person(s) Educated Patient;Parent(s)   Methods Explanation;Demonstration   Comprehension Verbalized understanding;Returned demonstration          OT Short Term Goals - 09/01/16 1356      OT SHORT TERM GOAL #1   Title Lymphedema (LE) management/ self-care: Pt able to apply multi layered, gradient compression wraps with MAX caregiver assistance using proper techniques within 2 weeks to achieve optimal limb volume reduction.   Baseline max caregiver assist; caregiver min assist   Time 2   Period Weeks   Status New     OT SHORT TERM GOAL #2   Title Lymphedema (LE) management/ self-care:  Pt to achieve at least 10% LLE limb volume reductions bilaterally during Intensive CDT to limit LE progression, decrease infection and falls risk, to reduce pain/, and to improve safe ambulation and functional mobility.   Baseline max assist   Time 12   Period Weeks   Status New     OT SHORT TERM GOAL #3   Title Lymphedema (LE) management/ self-care:  Pt >/= 85 % compliant with all daily, LE self-care protocols for home program w/ needed level of caregiver assistance , including simple self-manual lymphatic drainage (MLD), skin care, lymphatic pumping the ex, skin care, and donning/ doffing compression wraps and garments o limit LE progression and further functional decline.     Baseline Max A   Time 12   Period Weeks   Status New     OT SHORT TERM GOAL #4   Title Lymphedema (LE) management/ self-care:  Pt to tolerate daily  compression wraps, garments and devices in keeping w/ prescribed wear regime within 1 week of issue date to progress and retain clinical and functional gains and to limit LE progression.   Baseline mod A   Time 12   Period Weeks   Status New     OT SHORT TERM GOAL #5   Title Lymphedema (LE) self-care:  During Management Phase CDT Pt to sustain limb volume reductions achieved during Intensive Phase CDT within 5% utilizing LE self-care protocols, appropriate compression garments/ devices, and needed level of caregiver assistance.   Baseline Max A   Time 6   Period Months   Status New           OT Long Term Goals - 11/30/16 1035      OT LONG TERM GOAL #1   Title Pt able to correctly apply gradient compression wraps to below knee with moderate  caregiver assistance for optimal limb volume reduction and improvement in tissue integrity. 11/30/16: PT CURRENTLY REQUIRES MAX a X1 TO DON COMPRESSION WRAPS.   Baseline dependent    Time 2   Period Weeks   Status Partially Met     OT LONG TERM GOAL #2   Title Pt to achieve 15% limb volume reduction in LLE, and 10% reduction in RLE by DC to limit lymphedema (LE) progression and infection risk.- NEW GOAL 25%. 11/30/16: PT HAS MET 10% VOLUMETRIC REDUCTION GOAL BILATERALLY , AND, TO DATE, LLE "BELOW THE KNEE" (A-D) VOLUMETRIC REDUCTION ACHIEVED MEASURES 20.76%.  LLE "TOES TO GROIN" (A-G) LIMB VOLUME REDUCTION MEASURES 15.67% SINCE INITIALLY MEASURED FOR EPISODE 2 ON 08/27/16.   Baseline dependent   Time 12   Period Weeks   Status Achieved     OT LONG TERM GOAL #3   Title Pt IS CONSISTENTLY 85 % compliant with all daily LE self-care protocols w/  MAXIMUM caregiver assistance, including simple self-manual lymphatic drainage (MLD), skin care, lymphatic pumping therex, and donning/ doffing progression garments.. 11/30/16: GOAL MODIFIED TO REFLECT MORE REALISTI, SUSTAINABLE, DEGREE OF SUCCESSFUL LE SELF MANAGEMENT OVER TIME. DECREASED CONSISTENT COMPLIANCE  FROM 100% TO 85%, AND PT REQUIRES MAX ASSIST FROM CAREGIVER INSTEAD OF MODERATE ASSISTANCE.    Baseline dependent   Time 12   Period Weeks   Status Achieved     OT LONG TERM GOAL #5   Title Pt to remain infection free throughout CDT course to limit infection and LE progression. 11/30/16: ONGOING GOAL MET. NO INFECTIONS SINCE INITIALLY COMMENCING OT FOR BLE CDT IN 2016   Baseline dependent   Time 12   Period Weeks   Status On-going     OT LONG TERM GOAL #6   Title During Management Phase CDT Pt to sustain limb volume reductions achieved during Intensive Phase CDT within 5% utilizing LE self-care protocols, appropriate compression garments/ devices, and moderate caregiver assistance.   Baseline dependent   Time 6   Status On-going               Plan - 01/06/17 1443    Clinical Impression Statement Pt completed 2nd trial with basic, 8-chamber pneumatic compression pump today with [employer] rep present. Basic device providided retrograde sequential compression from foot to thigh w/ 32 mmHg.  Comparative PRE and POST BASIC trial measurements reveal no change in the circumference at the abkle landmark, and increased circumferences at calf, knee, thigh and abdomen circumferences indicating increased limb volumes. . See TREATMENT section of this note for specific measurement values. LLE trial was replicated with 32 chamber, Flexitouch  sequential pneumatic compression device , which  simulates manual lymphatic drainage, (MLD) by providing proximal to distal lymphatic stimulation. POST Flexitouch measurements were decreased at all landmarks with largest reductions observed at thigh and abdomen. Pt will benefit from the Flexitouch system for optimal , long-term , self-managenment of  chronic, progressive, moderate-severe, stage 2  BLE lymphedema to limit progression, recurrent infection, and further functional decline.   Rehab Potential Good   Clinical Impairments Affecting Rehab Potential  see SUBJECTIVE section for list of functional impairments 2/2 BLE LE   OT Frequency 3x / week   OT Duration 12 weeks   OT Treatment/Interventions Self-care/ADL training;Therapeutic exercise;Patient/family education;Manual Therapy;Manual lymph drainage;Therapeutic exercises;DME and/or AE instruction;Compression bandaging;Therapeutic activities;Scar mobilization;Other (comment)  skin care to limit infection risk and improve flexibility/ AROM   OT Home Exercise Plan 2 x daily lymphatic pumping ther ex- BLE- 2 sets  of 10 reps. Daily simple self MLD to BLE   Consulted and Agree with Plan of Care Patient;Family member/caregiver      Patient will benefit from skilled therapeutic intervention in order to improve the following deficits and impairments:  Abnormal gait, Decreased skin integrity, Decreased knowledge of precautions, Decreased scar mobility, Impaired perceived functional ability, Improper body mechanics, Decreased activity tolerance, Decreased knowledge of use of DME, Impaired flexibility, Decreased balance, Difficulty walking, Obesity, Decreased range of motion, Increased edema, Pain, Improper spinal/pelvic alignment  Visit Diagnosis: Lymphedema, not elsewhere classified    Problem List Patient Active Problem List   Diagnosis Date Noted  . Episodic tension-type headache, not intractable 07/22/2015  . Hydrocephalus, adult 09/10/2014  . Glaucoma 08/22/2014  . Preventative health care 05/18/2011  . Lymphedema 08/06/2006    Andrey Spearman, MS, OTR/L, [facility] 01/06/17 5:56 PM  [place] MAIN [facility] SERVICES [address] [place], [place], [address] Phone: [phone]   Fax:  [phone]  Name: Alison Fox MRN: [id number] Date of Birth: June 14, 1971

## 2017-01-14 ENCOUNTER — Ambulatory Visit: Payer: Medicare Other | Admitting: Occupational Therapy

## 2017-01-14 DIAGNOSIS — I890 Lymphedema, not elsewhere classified: Secondary | ICD-10-CM | POA: Diagnosis not present

## 2017-01-15 NOTE — Therapy (Signed)
[place] MAIN [facility] SERVICES [address] [place], [place], [address] Phone: [phone]   Fax:  [phone]  Occupational Therapy Treatment  Patient Details  Name: Alison Fox MRN: [id number] Date of Birth: 1971/05/15 No Data Recorded  Encounter Date: 01/14/2017      OT End of Session - 01/14/17 1210    Visit Number 32   Number of Visits 36   Date for OT Re-Evaluation 11/30/16   Authorization Type 27/36 (visit 14 for 2018)   OT Start Time 1115   OT Stop Time 1158   OT Time Calculation (min) 43 min   Activity Tolerance Patient tolerated treatment well;No increased pain   Behavior During Therapy WFL for tasks assessed/performed      Past Medical History:  Diagnosis Date  . Glaucoma 08/22/2014  . Headache   . 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 On 01/21/15, she saw Dr. Kathyrn Sheriff at [facility].  She had a repeat MRI of the brain without contrast which demonstrated symmetric ventriculomegaly of the lateral ventricles and third ventricle without significant transependymal flow.  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  Endoscopic third ventriculostomy was offered but not insisted since her only symptoms are headache, which is now mild.  She did not wish to proceed with this procedure.  . Lymphedema    Chronic following left knee surgery in 2000.  . Varicose veins     Past Surgical History:  Procedure Laterality Date  . KNEE ARTHROSCOPY  2000   Left knee.     There were no vitals filed for this visit.      Subjective Assessment - 01/15/17 1726    Subjective  Pt presents for OT visit 32/36 (15 for 2018) for CDT to LLE. Pt is accompanied by her mother today. Pt reorts that she has not yet heard from DME vendor re scheduling remeasurement for LLE thigh high garment. OT has reached out  to vendor 2 x for status update with no response.,   Patient is accompained by: Family member   Pertinent History onset in 63s and positive family history for maternal grandmother and cousin suggests primary LE Tarda; arthroscopic knee sx ~ 2000?, recent fall w/ head laceration. 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   Limitations difficulty walking, decreased balance, suspected BLE suspected, R>L,     Special Tests get the swelling down and keep it down   Patient Stated Goals replace my stockings and get the swelling down again.   Currently in Pain? No/denies   Pain Onset More than a month ago                      OT Treatments/Exercises (OP) - 01/15/17 0001      ADLs   ADL Education Given Yes     Manual Therapy   Manual Therapy Edema management;Manual Lymphatic Drainage (MLD);Compression Bandaging   Manual therapy comments LLE skin care as established   Soft tissue mobilization fibrosis technique at L medial and lateral malleolii, scar massage   Manual Lymphatic Drainage (MLD) Manual lymph drainage (MLD) to LLE in supine utilizing functional inguinal lymph nodes and deep abdominal lymphatics as is customary for non-cancer related  lower extremity LE, including bilateral "short neck" sequence, J strokes to sub and supraclavicular LN, deep abdominal pathways, functional inguinal LN, lower extremity proximal to distal w/ emphasis on medial knee bottleneck and politeal LN. Performed fibrosis technique to B maleoli and distal  legs to address fatty fibrosis. Good tolerance.   Compression Bandaging LLE multi layer compression wrap using  gradient technique- as established- on LLE; new ccl 2 knee high compression garment on RLE                OT Education - 01/15/17 1728    Education provided  Yes   Education Details Onoing Pt and caregiver edu for LE self care and progress towards goals   Person(s) Educated Patient;Parent(s)   Methods Explanation   Comprehension Need further instruction;Verbalized understanding          OT Short Term Goals - 09/01/16 1356      OT SHORT TERM GOAL #1   Title Lymphedema (LE) management/ self-care: Pt able to apply multi layered, gradient compression wraps with MAX caregiver assistance using proper techniques within 2 weeks to achieve optimal limb volume reduction.   Baseline max caregiver assist; caregiver min assist   Time 2   Period Weeks   Status New     OT SHORT TERM GOAL #2   Title Lymphedema (LE) management/ self-care:  Pt to achieve at least 10% LLE limb volume reductions bilaterally during Intensive CDT to limit LE progression, decrease infection and falls risk, to reduce pain/, and to improve safe ambulation and functional mobility.   Baseline max assist   Time 12   Period Weeks   Status New     OT SHORT TERM GOAL #3   Title Lymphedema (LE) management/ self-care:  Pt >/= 85 % compliant with all daily, LE self-care protocols for home program w/ needed level of caregiver assistance , including simple self-manual lymphatic drainage (MLD), skin care, lymphatic pumping the ex, skin care, and donning/ doffing compression wraps and garments o limit LE progression and further functional decline.     Baseline Max A   Time 12   Period Weeks   Status New     OT SHORT TERM GOAL #4   Title Lymphedema (LE) management/ self-care:  Pt to tolerate daily compression wraps, garments and devices in keeping w/ prescribed wear regime within 1 week of issue date to progress and retain clinical and functional gains and to limit LE progression.   Baseline mod A   Time 12   Period Weeks   Status New     OT SHORT TERM GOAL #5   Title Lymphedema (LE) self-care:  During Management Phase CDT Pt to sustain limb volume reductions achieved during Intensive  Phase CDT within 5% utilizing LE self-care protocols, appropriate compression garments/ devices, and needed level of caregiver assistance.   Baseline Max A   Time 6   Period Months   Status New           OT Long Term Goals - 11/30/16 1035      OT LONG TERM GOAL #1   Title Pt able to correctly apply gradient compression wraps to below knee with moderate caregiver assistance for optimal limb volume reduction and improvement in tissue integrity. 11/30/16: PT CURRENTLY REQUIRES MAX a X1 TO DON COMPRESSION WRAPS.   Baseline dependent    Time 2   Period Weeks   Status Partially Met     OT LONG TERM GOAL #2   Title Pt  to achieve 15% limb volume reduction in LLE, and 10% reduction in RLE by DC to limit lymphedema (LE) progression and infection risk.- NEW GOAL 25%. 11/30/16: PT HAS MET 10% VOLUMETRIC REDUCTION GOAL BILATERALLY , AND, TO DATE, LLE "BELOW THE KNEE" (A-D) VOLUMETRIC REDUCTION ACHIEVED MEASURES 20.76%.  LLE "TOES TO GROIN" (A-G) LIMB VOLUME REDUCTION MEASURES 15.67% SINCE INITIALLY MEASURED FOR EPISODE 2 ON 08/27/16.   Baseline dependent   Time 12   Period Weeks   Status Achieved     OT LONG TERM GOAL #3   Title Pt IS CONSISTENTLY 85 % compliant with all daily LE self-care protocols w/  MAXIMUM caregiver assistance, including simple self-manual lymphatic drainage (MLD), skin care, lymphatic pumping therex, and donning/ doffing progression garments.. 11/30/16: GOAL MODIFIED TO REFLECT MORE REALISTI, SUSTAINABLE, DEGREE OF SUCCESSFUL LE SELF MANAGEMENT OVER TIME. DECREASED CONSISTENT COMPLIANCE FROM 100% TO 85%, AND PT REQUIRES MAX ASSIST FROM CAREGIVER INSTEAD OF MODERATE ASSISTANCE.    Baseline dependent   Time 12   Period Weeks   Status Achieved     OT LONG TERM GOAL #5   Title Pt to remain infection free throughout CDT course to limit infection and LE progression. 11/30/16: ONGOING GOAL MET. NO INFECTIONS SINCE INITIALLY COMMENCING OT FOR BLE CDT IN 2016   Baseline dependent    Time 12   Period Weeks   Status On-going     OT LONG TERM GOAL #6   Title During Management Phase CDT Pt to sustain limb volume reductions achieved during Intensive Phase CDT within 5% utilizing LE self-care protocols, appropriate compression garments/ devices, and moderate caregiver assistance.   Baseline dependent   Time 6   Status On-going               Plan - 01/15/17 1729    Clinical Impression Statement Pt not yet fitted with LLE remake for custom thigh length compression garment. DME vendor has been unresponsive to OT email requests for updates on status. OT sent email abain today requesting Pt contact to schedule and confirmation to OT for tratemtn planning Pt managing LE well between visits w/ mother's assistance. Not having replacement garment from DME vendor poses a large burden of care for Pt  as initial garment is unusable as delivered to Pt at her home. Cont as per POC. Seek out alternative Medicaid certified vendor if no response from vendor by end of week. Cont POC.   Rehab Potential Good   Clinical Impairments Affecting Rehab Potential see SUBJECTIVE section for list of functional impairments 2/2 BLE LE   OT Frequency 3x / week   OT Duration 12 weeks   OT Treatment/Interventions Self-care/ADL training;Therapeutic exercise;Patient/family education;Manual Therapy;Manual lymph drainage;Therapeutic exercises;DME and/or AE instruction;Compression bandaging;Therapeutic activities;Scar mobilization;Other (comment)  skin care to limit infection risk and improve flexibility/ AROM   OT Home Exercise Plan 2 x daily lymphatic pumping ther ex- BLE- 2 sets of 10 reps. Daily simple self MLD to BLE   Consulted and Agree with Plan of Care Patient;Family member/caregiver      Patient will benefit from skilled therapeutic intervention in order to improve the following deficits and impairments:  Abnormal gait, Decreased skin integrity, Decreased knowledge of precautions, Decreased scar  mobility, Impaired perceived functional ability, Improper body mechanics, Decreased activity tolerance, Decreased knowledge of use of DME, Impaired flexibility, Decreased balance, Difficulty walking, Obesity, Decreased range of motion, Increased edema, Pain, Improper spinal/pelvic alignment  Visit Diagnosis: Lymphedema, not elsewhere classified    Problem List Patient Active  Problem List   Diagnosis Date Noted  . Episodic tension-type headache, not intractable 07/22/2015  . Hydrocephalus, adult 09/10/2014  . Glaucoma 08/22/2014  . Preventative health care 05/18/2011  . Lymphedema 08/06/2006    Andrey Spearman, MS, OTR/L, [facility] 01/15/17 5:37 PM  [place] MAIN [facility] SERVICES [address] [place], [place], [address] Phone: [phone]   Fax:  [phone]  Name: Alison Fox MRN: [id number] Date of Birth: 07/16/1971

## 2017-01-19 ENCOUNTER — Encounter: Payer: Self-pay | Admitting: Neurology

## 2017-01-19 ENCOUNTER — Ambulatory Visit
Payer: Medicare Other | Attending: Student in an Organized Health Care Education/Training Program | Admitting: Occupational Therapy

## 2017-01-19 ENCOUNTER — Ambulatory Visit (INDEPENDENT_AMBULATORY_CARE_PROVIDER_SITE_OTHER): Payer: Medicare Other | Admitting: Neurology

## 2017-01-19 VITALS — BP 104/62 | HR 92 | Ht 69.0 in | Wt 168.0 lb

## 2017-01-19 DIAGNOSIS — G44219 Episodic tension-type headache, not intractable: Secondary | ICD-10-CM

## 2017-01-19 DIAGNOSIS — G919 Hydrocephalus, unspecified: Secondary | ICD-10-CM

## 2017-01-19 DIAGNOSIS — I890 Lymphedema, not elsewhere classified: Secondary | ICD-10-CM | POA: Insufficient documentation

## 2017-01-19 NOTE — Patient Instructions (Signed)
1.  Continue nortriptyline  at bedtime 2.  Follow up in 8 months.

## 2017-01-19 NOTE — Therapy (Signed)
[place] MAIN [facility] SERVICES [address] [place], [place], [address] Phone: [phone]   Fax:  [phone]  Occupational Therapy Treatment  Patient Details  Name: Alison Fox MRN: [id number] Date of Birth: August 22, 1971 No Data Recorded  Encounter Date: 01/19/2017      OT End of Session - 01/19/17 1206    Visit Number 33   Number of Visits 36   Date for OT Re-Evaluation 11/30/16   Authorization Type 27/36 (visit 14 for 2018)   OT Start Time 1100   OT Stop Time 7673   OT Time Calculation (min) 65 min   Activity Tolerance Patient tolerated treatment well;No increased pain   Behavior During Therapy WFL for tasks assessed/performed      Past Medical History:  Diagnosis Date  . Glaucoma 08/22/2014  . Headache   . 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 On 01/21/15, she saw Dr. Kathyrn Sheriff at [facility].  She had a repeat MRI of the brain without contrast which demonstrated symmetric ventriculomegaly of the lateral ventricles and third ventricle without significant transependymal flow.  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  Endoscopic third ventriculostomy was offered but not insisted since her only symptoms are headache, which is now mild.  She did not wish to proceed with this procedure.  . Lymphedema    Chronic following left knee surgery in 2000.  . Varicose veins     Past Surgical History:  Procedure Laterality Date  . KNEE ARTHROSCOPY  2000   Left knee.     There were no vitals filed for this visit.      Subjective Assessment - 01/19/17 1206    Subjective  Pt presents for OT visit 33/36 (16 for 2018) for CDT to LLE. Pt is accompanied by her mother today. Pt reorts that she has not yet heard from DME vendor re scheduling remeasurement for LLE thigh high garment. OT assisted Pt to  telephone vendor to check status of her remake. Vendor scheduled remeasurement apt next Tues at 11 AM.   Patient is accompained by: Family member   Pertinent History onset in 57s and positive family history for maternal grandmother and cousin suggests primary LE Tarda; arthroscopic knee sx ~ 2000?, recent fall w/ head laceration. 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   Limitations difficulty walking, decreased balance, suspected BLE suspected, R>L,     Special Tests get the swelling down and keep it down   Patient Stated Goals replace my stockings and get the swelling down again.   Currently in Pain? No/denies   Pain Onset More than a month ago                      OT Treatments/Exercises (OP) - 01/19/17 0001      ADLs   ADL Education Given Yes     Manual Therapy   Manual Therapy Edema management;Manual Lymphatic Drainage (MLD);Compression Bandaging;Soft tissue mobilization;Other (comment)  scar massage, fibrosis technique   Manual therapy comments LLE skin care as established   Soft tissue mobilization fibrosis technique at L medial and lateral malleolii, scar massage   Manual Lymphatic Drainage (MLD) Manual lymph drainage (MLD) to LLE in supine  utilizing functional inguinal lymph nodes and deep abdominal lymphatics as is customary for non-cancer related lower extremity LE, including bilateral "short neck" sequence, J strokes to sub and supraclavicular LN, deep abdominal pathways, functional inguinal LN, lower extremity proximal to distal w/ emphasis on medial knee bottleneck and politeal LN. Performed fibrosis technique to B maleoli and distal  legs to address fatty fibrosis. Good tolerance.   Compression Bandaging LLE multi layer compression wrap using  gradient technique- as established- on  LLE; new ccl 2 knee high compression garment on RLE                OT Education - 01/19/17 1208    Education provided Yes   Education Details Continued skilled Pt/caregiver education  And LE ADL training throughout visit for lymphedema self care/ home program, including compression wrapping, compression garment and device wear/care, lymphatic pumping ther ex, simple self-MLD, and skin care. Discussed progress towards goals.   Person(s) Educated Patient   Methods Explanation   Comprehension Verbalized understanding          OT Short Term Goals - 09/01/16 1356      OT SHORT TERM GOAL #1   Title Lymphedema (LE) management/ self-care: Pt able to apply multi layered, gradient compression wraps with MAX caregiver assistance using proper techniques within 2 weeks to achieve optimal limb volume reduction.   Baseline max caregiver assist; caregiver min assist   Time 2   Period Weeks   Status New     OT SHORT TERM GOAL #2   Title Lymphedema (LE) management/ self-care:  Pt to achieve at least 10% LLE limb volume reductions bilaterally during Intensive CDT to limit LE progression, decrease infection and falls risk, to reduce pain/, and to improve safe ambulation and functional mobility.   Baseline max assist   Time 12   Period Weeks   Status New     OT SHORT TERM GOAL #3   Title Lymphedema (LE) management/ self-care:  Pt >/= 85 % compliant with all daily, LE self-care protocols for home program w/ needed level of caregiver assistance , including simple self-manual lymphatic drainage (MLD), skin care, lymphatic pumping the ex, skin care, and donning/ doffing compression wraps and garments o limit LE progression and further functional decline.     Baseline Max A   Time 12   Period Weeks   Status New     OT SHORT TERM GOAL #4   Title Lymphedema (LE) management/ self-care:  Pt to tolerate daily compression wraps, garments and devices in keeping w/ prescribed wear regime within 1 week  of issue date to progress and retain clinical and functional gains and to limit LE progression.   Baseline mod A   Time 12   Period Weeks   Status New     OT SHORT TERM GOAL #5   Title Lymphedema (LE) self-care:  During Management Phase CDT Pt to sustain limb volume reductions achieved during Intensive Phase CDT within 5% utilizing LE self-care protocols, appropriate compression garments/ devices, and needed level of caregiver assistance.   Baseline Max A   Time 6   Period Months   Status New           OT Long Term Goals - 11/30/16 1035      OT LONG TERM GOAL #1   Title Pt able to correctly apply gradient compression wraps to below knee with moderate caregiver assistance for optimal limb volume reduction and improvement in tissue integrity. 11/30/16: PT CURRENTLY REQUIRES  MAX a X1 TO DON COMPRESSION WRAPS.   Baseline dependent    Time 2   Period Weeks   Status Partially Met     OT LONG TERM GOAL #2   Title Pt to achieve 15% limb volume reduction in LLE, and 10% reduction in RLE by DC to limit lymphedema (LE) progression and infection risk.- NEW GOAL 25%. 11/30/16: PT HAS MET 10% VOLUMETRIC REDUCTION GOAL BILATERALLY , AND, TO DATE, LLE "BELOW THE KNEE" (A-D) VOLUMETRIC REDUCTION ACHIEVED MEASURES 20.76%.  LLE "TOES TO GROIN" (A-G) LIMB VOLUME REDUCTION MEASURES 15.67% SINCE INITIALLY MEASURED FOR EPISODE 2 ON 08/27/16.   Baseline dependent   Time 12   Period Weeks   Status Achieved     OT LONG TERM GOAL #3   Title Pt IS CONSISTENTLY 85 % compliant with all daily LE self-care protocols w/  MAXIMUM caregiver assistance, including simple self-manual lymphatic drainage (MLD), skin care, lymphatic pumping therex, and donning/ doffing progression garments.. 11/30/16: GOAL MODIFIED TO REFLECT MORE REALISTI, SUSTAINABLE, DEGREE OF SUCCESSFUL LE SELF MANAGEMENT OVER TIME. DECREASED CONSISTENT COMPLIANCE FROM 100% TO 85%, AND PT REQUIRES MAX ASSIST FROM CAREGIVER INSTEAD OF MODERATE ASSISTANCE.     Baseline dependent   Time 12   Period Weeks   Status Achieved     OT LONG TERM GOAL #5   Title Pt to remain infection free throughout CDT course to limit infection and LE progression. 11/30/16: ONGOING GOAL MET. NO INFECTIONS SINCE INITIALLY COMMENCING OT FOR BLE CDT IN 2016   Baseline dependent   Time 12   Period Weeks   Status On-going     OT LONG TERM GOAL #6   Title During Management Phase CDT Pt to sustain limb volume reductions achieved during Intensive Phase CDT within 5% utilizing LE self-care protocols, appropriate compression garments/ devices, and moderate caregiver assistance.   Baseline dependent   Time 6   Status On-going               Plan - 01/19/17 1210    Clinical Impression Statement LLE more swollen and dense w/ congestion again today Last 2 visits Pt's LLE has been creeping up in volume. Pt admits that she has not been utilizing compression wraps daily as instructed. OT stressed the importan ce of daily compression wrapping   until  garment remeasurement appointment and fitting to ensure optimal clinical gain. Pt agrees to wrap as instructed.   Rehab Potential Good   Clinical Impairments Affecting Rehab Potential see SUBJECTIVE section for list of functional impairments 2/2 BLE LE   OT Frequency 3x / week   OT Duration 12 weeks   OT Treatment/Interventions Self-care/ADL training;Therapeutic exercise;Patient/family education;Manual Therapy;Manual lymph drainage;Therapeutic exercises;DME and/or AE instruction;Compression bandaging;Therapeutic activities;Scar mobilization;Other (comment)  skin care to limit infection risk and improve flexibility/ AROM   OT Home Exercise Plan 2 x daily lymphatic pumping ther ex- BLE- 2 sets of 10 reps. Daily simple self MLD to BLE   Consulted and Agree with Plan of Care Patient;Family member/caregiver      Patient will benefit from skilled therapeutic intervention in order to improve the following deficits and impairments:   Abnormal gait, Decreased skin integrity, Decreased knowledge of precautions, Decreased scar mobility, Impaired perceived functional ability, Improper body mechanics, Decreased activity tolerance, Decreased knowledge of use of DME, Impaired flexibility, Decreased balance, Difficulty walking, Obesity, Decreased range of motion, Increased edema, Pain, Improper spinal/pelvic alignment  Visit Diagnosis: Lymphedema    Problem List Patient Active Problem List  Diagnosis Date Noted  . Episodic tension-type headache, not intractable 07/22/2015  . Hydrocephalus, adult 09/10/2014  . Glaucoma 08/22/2014  . Preventative health care 05/18/2011  . Lymphedema 08/06/2006   Andrey Spearman, MS, OTR/L, [facility] 01/19/17 12:13 PM   [place] MAIN [facility] SERVICES [address] [place], [place], [address] Phone: [phone]   Fax:  [phone]  Name: Alison Fox MRN: [id number] Date of Birth: [birth year]

## 2017-01-19 NOTE — Progress Notes (Signed)
NEUROLOGY FOLLOW UP OFFICE NOTE  Alison Fox [id number]  HISTORY OF PRESENT ILLNESS: Alison Fox is a 46 year old right-handed woman with glaucoma and lymphedema who follows up for tension type headache and hydrocephalus.  She is accompanied by her mother who provides some history.     UPDATE: Headaches are much improved.  Sometimes her forehead feels sore to touch on the left.   Intensity:  3/10; October: 8-9/10 Duration:  one hour; October: One hour Frequency:  once a week to every other week; October: Once or twice a week Abortive therapy:  Advil, Tylenol Preventative therapy:  nortriptyline   She also developed a different headache, in which she experiences a pain at the top of her head which radiates down the left side to her jaw.  She was prescribed cyclobenzaprine and advised to see a dentist for possible TMJ dysfunction.  Cyclobenzaprine was discontinued by her PCP because it was causing falls and memory issues.  She reports soreness in the left temple.  She saw her dentist and everything was fine. Jaw pain has resolved.   HISTORY: Headaches started in 2015.  The headaches are bi-frontal, non-throbbing sharp pain, and 8.5-9/10 intensity.  Initially, they lasted all day and occured daily.  They are associated with dizziness but not nausea.  She was taking Advil or Tylenol daily.  She denied preceding trauma or precipitating factor.  She has history of occasional mild headache, but nothing like this.  She reportedly has longstanding history of glaucoma since childhood, amblyopia of the left eye.  As per her exam from 08/07/14 by her ophthalmologist, Dr. Ames Coupe, fundi are unremarkable.     A CT of the head performed on 09/10/14 showed symmetric bilateral ventriculomegaly, enlargement of the third ventricle, minimal enlargement of the fourth ventricle and poorly visualized cerebral acqueduct.  Thinning of the overlying cortex suggests this is chronic hydrocephalus.     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  On 01/21/15, she saw Dr. Conchita Paris at [facility].  She had a repeat MRI of the brain without contrast which demonstrated symmetric ventriculomegaly of the lateral ventricles and third ventricle without significant transependymal flow.  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  Endoscopic third ventriculostomy was offered but not insisted since her only symptoms are headache, which is now mild.  She did not wish to proceed with this procedure.  PAST MEDICAL HISTORY: Past Medical History:  Diagnosis Date  . Glaucoma 08/22/2014  . Headache   . 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 On 01/21/15, she saw Dr. Conchita Paris at [facility].  She had a repeat MRI of the brain without contrast which demonstrated symmetric ventriculomegaly of the lateral ventricles and third ventricle without significant transependymal flow.  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  Endoscopic third ventriculostomy was offered but not insisted since her only symptoms are headache, which is now mild.  She did not wish to proceed with this procedure.  . Lymphedema    Chronic following left knee surgery in 2000.  . Varicose veins     MEDICATIONS: Current Outpatient Prescriptions on File Prior to Visit  Medication Sig Dispense Refill  . nortriptyline (PAMELOR) 50 MG capsule Take 2 capsules (100 mg total) by mouth at bedtime. 180 capsule 2   No current facility-administered medications on  file prior to visit.     ALLERGIES: No Known Allergies  FAMILY HISTORY: Family History  Problem Relation Age of Onset  . Heart disease Maternal Aunt   . Heart disease Maternal Uncle   . Stroke Paternal Uncle    . Cancer Paternal Uncle     Melanoma  . Diabetes Maternal Grandmother   . Diabetes Paternal Grandfather     SOCIAL HISTORY: Social History   Social History  . Marital status: Single    Spouse name: N/A  . Number of children: N/A  . Years of education: N/A   Occupational History  . Not on file.   Social History Main Topics  . Smoking status: Never Smoker  . Smokeless tobacco: Never Used  . Alcohol use No  . Drug use: No  . Sexual activity: No   Other Topics Concern  . Not on file   Social History Narrative  . No narrative on file    REVIEW OF SYSTEMS: Constitutional: No fevers, chills, or sweats, no generalized fatigue, change in appetite Eyes: No visual changes, double vision, eye pain Ear, nose and throat: No hearing loss, ear pain, nasal congestion, sore throat Cardiovascular: No chest pain, palpitations Respiratory:  No shortness of breath at rest or with exertion, wheezes GastrointestinaI: No nausea, vomiting, diarrhea, abdominal pain, fecal incontinence Genitourinary:  No dysuria, urinary retention or frequency Musculoskeletal:  No neck pain, back pain Integumentary: No rash, pruritus, skin lesions Neurological: as above Psychiatric: No depression, insomnia, anxiety Endocrine: No palpitations, fatigue, diaphoresis, mood swings, change in appetite, change in weight, increased thirst Hematologic/Lymphatic:  Left lower extremity lymphadema Allergic/Immunologic: no itchy/runny eyes, nasal congestion, recent allergic reactions, rashes  PHYSICAL EXAM: Vitals:   01/19/17 0921  BP: 104/62  Pulse: 92   General: No acute distress.  Patient appears well-groomed.  normal body habitus. Head:  Normocephalic/atraumatic Eyes:  Fundi examined but not visualized Neck: supple, no paraspinal tenderness, full range of motion Heart:  Regular rate and rhythm Lungs:  Clear to auscultation bilaterally Back: No paraspinal tenderness Neurological Exam: alert and oriented to  person, place, and time. Attention span and concentration intact, recent and remote memory intact, fund of knowledge fair (did not know the new president).  Speech fluent and not dysarthric, language intact.  Left exotropia.  Otherwise, CN II-XII intact. Bulk and tone normal, muscle strength 5/5 throughout.  Sensation to light touch  intact.  Deep tendon reflexes 2+ throughout.  Finger to nose and heel to shin testing intact.  Gait normal  IMPRESSION: Tension-type headache, improved Hydrocephalus  PLAN: 1. Continue nortriptyline  at bedtime. 2.  Advil or Tylenol as needed 3.  Follow up in 8 months.  Shon Millet, DO  CC:  Erlinda Hong, MD

## 2017-01-27 ENCOUNTER — Ambulatory Visit: Payer: Medicare Other | Admitting: Occupational Therapy

## 2017-01-27 DIAGNOSIS — I890 Lymphedema, not elsewhere classified: Secondary | ICD-10-CM | POA: Diagnosis not present

## 2017-01-27 NOTE — Therapy (Signed)
[place] MAIN [facility] SERVICES [address] [place], [place], [address] Phone: [phone]   Fax:  [phone]  Occupational Therapy Treatment  Patient Details  Name: Alison Fox MRN: [id number] Date of Birth: 05-25-1971 No Data Recorded  Encounter Date: 01/27/2017      OT End of Session - 01/27/17 1223    Visit Number 34   Number of Visits 36   Date for OT Re-Evaluation 11/30/16   Authorization Type 27/36 (visit 14 for 2018)   OT Start Time 1000   OT Stop Time 1100   OT Time Calculation (min) 60 min   Activity Tolerance Patient tolerated treatment well;No increased pain   Behavior During Therapy WFL for tasks assessed/performed      Past Medical History:  Diagnosis Date  . Glaucoma 08/22/2014  . Headache   . 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 On 01/21/15, she saw Dr. Kathyrn Sheriff at [facility].  She had a repeat MRI of the brain without contrast which demonstrated symmetric ventriculomegaly of the lateral ventricles and third ventricle without significant transependymal flow.  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  Endoscopic third ventriculostomy was offered but not insisted since her only symptoms are headache, which is now mild.  She did not wish to proceed with this procedure.  . Lymphedema    Chronic following left knee surgery in 2000.  . Varicose veins     Past Surgical History:  Procedure Laterality Date  . KNEE ARTHROSCOPY  2000   Left knee.     There were no vitals filed for this visit.      Subjective Assessment - 01/27/17 1006    Subjective  Pt presents for OT visit 34/36 (16 for 2018) for CDT to LLE. Pt is accompanied by her mother today. Pt reports that she and her mother were unable to find the DME vendor's storefront yesterday, so she was not remeasured for the  thigh high garment. OT contacted DME vendor and rescheduled on Pt's behalf. OT provided printed phone numbers and directions from [employer].   Patient is accompained by: Family member   Pertinent History onset in 63s and positive family history for maternal grandmother and cousin suggests primary LE Tarda; arthroscopic knee sx ~ 2000?, recent fall w/ head laceration. 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   Limitations difficulty walking, decreased balance, suspected BLE suspected, R>L,     Special Tests get the swelling down and keep it down   Patient Stated Goals replace my stockings and get the swelling down again.   Currently in Pain? No/denies   Pain Onset More than a month ago                      OT Treatments/Exercises (OP) - 01/27/17 0001      ADLs   ADL Education Given Yes     Manual Therapy   Manual Therapy Edema management;Manual Lymphatic Drainage (MLD);Compression Bandaging;Soft tissue mobilization;Other (comment)  scar massage, fibrosis technique   Manual therapy comments LLE skin care as established   Soft tissue mobilization fibrosis technique at L medial and lateral malleolii, scar massage   Manual Lymphatic Drainage (MLD) Manual lymph drainage (  MLD) to LLE in supine utilizing functional inguinal lymph nodes and deep abdominal lymphatics as is customary for non-cancer related lower extremity LE, including bilateral "short neck" sequence, J strokes to sub and supraclavicular LN, deep abdominal pathways, functional inguinal LN, lower extremity proximal to distal w/ emphasis on medial knee bottleneck and politeal LN. Performed fibrosis technique to B maleoli and distal  legs to address fatty fibrosis. Good tolerance.   Compression Bandaging LLE multi layer compression wrap using   gradient technique- as established- on LLE; new ccl 2 knee high compression garment on RLE                OT Education - 01/27/17 1104    Education provided (P)  Yes   Education Details (P)  emphasis of Pt edu today on LE self care and importance of HOS device. Pt non compliamt w/ [employer] todate   Person(s) Educated (P)  Patient;Parent(s)   Methods (P)  Explanation   Comprehension (P)  Verbalized understanding;Need further instruction          OT Short Term Goals - 09/01/16 1356      OT SHORT TERM GOAL #1   Title Lymphedema (LE) management/ self-care: Pt able to apply multi layered, gradient compression wraps with MAX caregiver assistance using proper techniques within 2 weeks to achieve optimal limb volume reduction.   Baseline max caregiver assist; caregiver min assist   Time 2   Period Weeks   Status New     OT SHORT TERM GOAL #2   Title Lymphedema (LE) management/ self-care:  Pt to achieve at least 10% LLE limb volume reductions bilaterally during Intensive CDT to limit LE progression, decrease infection and falls risk, to reduce pain/, and to improve safe ambulation and functional mobility.   Baseline max assist   Time 12   Period Weeks   Status New     OT SHORT TERM GOAL #3   Title Lymphedema (LE) management/ self-care:  Pt >/= 85 % compliant with all daily, LE self-care protocols for home program w/ needed level of caregiver assistance , including simple self-manual lymphatic drainage (MLD), skin care, lymphatic pumping the ex, skin care, and donning/ doffing compression wraps and garments o limit LE progression and further functional decline.     Baseline Max A   Time 12   Period Weeks   Status New     OT SHORT TERM GOAL #4   Title Lymphedema (LE) management/ self-care:  Pt to tolerate daily compression wraps, garments and devices in keeping w/ prescribed wear regime within 1 week of issue date to progress and retain clinical and functional gains and to limit  LE progression.   Baseline mod A   Time 12   Period Weeks   Status New     OT SHORT TERM GOAL #5   Title Lymphedema (LE) self-care:  During Management Phase CDT Pt to sustain limb volume reductions achieved during Intensive Phase CDT within 5% utilizing LE self-care protocols, appropriate compression garments/ devices, and needed level of caregiver assistance.   Baseline Max A   Time 6   Period Months   Status New           OT Long Term Goals - 11/30/16 1035      OT LONG TERM GOAL #1   Title Pt able to correctly apply gradient compression wraps to below knee with moderate caregiver assistance for optimal limb volume reduction and improvement in tissue integrity. 11/30/16: PT CURRENTLY REQUIRES MAX  a X1 TO DON COMPRESSION WRAPS.   Baseline dependent    Time 2   Period Weeks   Status Partially Met     OT LONG TERM GOAL #2   Title Pt to achieve 15% limb volume reduction in LLE, and 10% reduction in RLE by DC to limit lymphedema (LE) progression and infection risk.- NEW GOAL 25%. 11/30/16: PT HAS MET 10% VOLUMETRIC REDUCTION GOAL BILATERALLY , AND, TO DATE, LLE "BELOW THE KNEE" (A-D) VOLUMETRIC REDUCTION ACHIEVED MEASURES 20.76%.  LLE "TOES TO GROIN" (A-G) LIMB VOLUME REDUCTION MEASURES 15.67% SINCE INITIALLY MEASURED FOR EPISODE 2 ON 08/27/16.   Baseline dependent   Time 12   Period Weeks   Status Achieved     OT LONG TERM GOAL #3   Title Pt IS CONSISTENTLY 85 % compliant with all daily LE self-care protocols w/  MAXIMUM caregiver assistance, including simple self-manual lymphatic drainage (MLD), skin care, lymphatic pumping therex, and donning/ doffing progression garments.. 11/30/16: GOAL MODIFIED TO REFLECT MORE REALISTI, SUSTAINABLE, DEGREE OF SUCCESSFUL LE SELF MANAGEMENT OVER TIME. DECREASED CONSISTENT COMPLIANCE FROM 100% TO 85%, AND PT REQUIRES MAX ASSIST FROM CAREGIVER INSTEAD OF MODERATE ASSISTANCE.    Baseline dependent   Time 12   Period Weeks   Status Achieved     OT  LONG TERM GOAL #5   Title Pt to remain infection free throughout CDT course to limit infection and LE progression. 11/30/16: ONGOING GOAL MET. NO INFECTIONS SINCE INITIALLY COMMENCING OT FOR BLE CDT IN 2016   Baseline dependent   Time 12   Period Weeks   Status On-going     OT LONG TERM GOAL #6   Title During Management Phase CDT Pt to sustain limb volume reductions achieved during Intensive Phase CDT within 5% utilizing LE self-care protocols, appropriate compression garments/ devices, and moderate caregiver assistance.   Baseline dependent   Time 6   Status On-going               Plan - 01/27/17 1225    Clinical Impression Statement LLE limb volume is mildly decreased since last session. Parent reports improved compliance with compression wraps since last visit, but not 100%. Pt also non-compliant w/ HOS devices. OKd Pt leave L HOS device until garments are finally fitted properly. Pt agrees to use RLE HOS device 3 x this week as means to building tolerance and compliance.  Cont as per POC. Next garment re-measurement apt scheduled w/ DME vendor on the phone today for 4/19 at 11 AM.   Rehab Potential Good   Clinical Impairments Affecting Rehab Potential see SUBJECTIVE section for list of functional impairments 2/2 BLE LE   OT Frequency 3x / week   OT Duration 12 weeks   OT Treatment/Interventions Self-care/ADL training;Therapeutic exercise;Patient/family education;Manual Therapy;Manual lymph drainage;Therapeutic exercises;DME and/or AE instruction;Compression bandaging;Therapeutic activities;Scar mobilization;Other (comment)  skin care to limit infection risk and improve flexibility/ AROM   OT Home Exercise Plan 2 x daily lymphatic pumping ther ex- BLE- 2 sets of 10 reps. Daily simple self MLD to BLE   Consulted and Agree with Plan of Care Patient;Family member/caregiver      Patient will benefit from skilled therapeutic intervention in order to improve the following deficits and  impairments:  Abnormal gait, Decreased skin integrity, Decreased knowledge of precautions, Decreased scar mobility, Impaired perceived functional ability, Improper body mechanics, Decreased activity tolerance, Decreased knowledge of use of DME, Impaired flexibility, Decreased balance, Difficulty walking, Obesity, Decreased range of motion, Increased edema, Pain, Improper  spinal/pelvic alignment  Visit Diagnosis: Lymphedema, not elsewhere classified    Problem List Patient Active Problem List   Diagnosis Date Noted  . Episodic tension-type headache, not intractable 07/22/2015  . Hydrocephalus, adult 09/10/2014  . Glaucoma 08/22/2014  . Preventative health care 05/18/2011  . Lymphedema 08/06/2006    Andrey Spearman, MS, OTR/L, [facility] 01/27/17 12:27 PM   [place] MAIN [facility] SERVICES [address] [place], [place], [address] Phone: [phone]   Fax:  [phone]  Name: Kadedra Vanaken MRN: [id number] Date of Birth: [birth year]

## 2017-02-03 ENCOUNTER — Ambulatory Visit: Payer: Medicare Other | Admitting: Occupational Therapy

## 2017-02-03 DIAGNOSIS — I890 Lymphedema, not elsewhere classified: Secondary | ICD-10-CM | POA: Diagnosis not present

## 2017-02-03 NOTE — Therapy (Signed)
[place] MAIN [facility] SERVICES [address] [place], [place], [address] Phone: [phone]   Fax:  [phone]  Occupational Therapy Treatment  Patient Details  Name: Alison Fox MRN: [id number] Date of Birth: Oct 29, 1970 No Data Recorded  Encounter Date: 02/03/2017      OT End of Session - 02/03/17 1223    Visit Number 34   Number of Visits 36   Date for OT Re-Evaluation 11/30/16   Authorization Type 27/36 (visit 14 for 2018)   OT Start Time 1015   OT Stop Time 1105   OT Time Calculation (min) 50 min   Activity Tolerance Patient tolerated treatment well;No increased pain   Behavior During Therapy WFL for tasks assessed/performed      Past Medical History:  Diagnosis Date  . Glaucoma 08/22/2014  . Headache   . 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 On 01/21/15, she saw Dr. Kathyrn Sheriff at [facility].  She had a repeat MRI of the brain without contrast which demonstrated symmetric ventriculomegaly of the lateral ventricles and third ventricle without significant transependymal flow.  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  Endoscopic third ventriculostomy was offered but not insisted since her only symptoms are headache, which is now mild.  She did not wish to proceed with this procedure.  . Lymphedema    Chronic following left knee surgery in 2000.  . Varicose veins     Past Surgical History:  Procedure Laterality Date  . KNEE ARTHROSCOPY  2000   Left knee.     There were no vitals filed for this visit.      Subjective Assessment - 02/03/17 1026    Subjective  Pt presents for OT visit 35/72 (16 for 2018) for CDT to LLE. Pt is accompanied by her mother today. Pt reports she went to DME vendor on Tuesday, but vendor told her ,"to return in a week because my eg is too swollen."  Measurements for LLE compression stocking was not completed.   Patient is accompained by: Family member   Pertinent History onset in 47s and positive family history for maternal grandmother and cousin suggests primary LE Tarda; arthroscopic knee sx ~ 2000?, recent fall w/ head laceration. 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   Limitations difficulty walking, decreased balance, suspected BLE suspected, R>L,     Special Tests get the swelling down and keep it down   Patient Stated Goals replace my stockings and get the swelling down again.   Currently in Pain? No/denies   Pain Onset More than a month ago                              OT Education - 02/03/17 1212    Education provided Yes   Education Details Pilar Plate and extensive conversation w/ patient explaining why it is important that she remain in compression wraps 23/7 until gatment is fitted. Her mother stated that Pt has not been sleeping in wraps and has been reluctant to let her rewrap as regularly as recommended. Stressed to Pt that garmtn containment and LE control is dependent on optimal volumetric reduction at time of  garment measurement  and fitting. Reitterated RLE garment and HOS wear and care regime.   Person(s) Educated Patient;Parent(s)   Methods Explanation;Demonstration   Comprehension Verbalized understanding          OT Short Term Goals - 09/01/16 1356      OT SHORT TERM GOAL #1   Title Lymphedema (LE) management/ self-care: Pt able to apply multi layered, gradient compression wraps with MAX caregiver assistance using proper techniques within 2 weeks to achieve optimal limb volume reduction.   Baseline max caregiver assist; caregiver min assist   Time 2   Period Weeks   Status New     OT SHORT TERM GOAL #2    Title Lymphedema (LE) management/ self-care:  Pt to achieve at least 10% LLE limb volume reductions bilaterally during Intensive CDT to limit LE progression, decrease infection and falls risk, to reduce pain/, and to improve safe ambulation and functional mobility.   Baseline max assist   Time 12   Period Weeks   Status New     OT SHORT TERM GOAL #3   Title Lymphedema (LE) management/ self-care:  Pt >/= 85 % compliant with all daily, LE self-care protocols for home program w/ needed level of caregiver assistance , including simple self-manual lymphatic drainage (MLD), skin care, lymphatic pumping the ex, skin care, and donning/ doffing compression wraps and garments o limit LE progression and further functional decline.     Baseline Max A   Time 12   Period Weeks   Status New     OT SHORT TERM GOAL #4   Title Lymphedema (LE) management/ self-care:  Pt to tolerate daily compression wraps, garments and devices in keeping w/ prescribed wear regime within 1 week of issue date to progress and retain clinical and functional gains and to limit LE progression.   Baseline mod A   Time 12   Period Weeks   Status New     OT SHORT TERM GOAL #5   Title Lymphedema (LE) self-care:  During Management Phase CDT Pt to sustain limb volume reductions achieved during Intensive Phase CDT within 5% utilizing LE self-care protocols, appropriate compression garments/ devices, and needed level of caregiver assistance.   Baseline Max A   Time 6   Period Months   Status New           OT Long Term Goals - 11/30/16 1035      OT LONG TERM GOAL #1   Title Pt able to correctly apply gradient compression wraps to below knee with moderate caregiver assistance for optimal limb volume reduction and improvement in tissue integrity. 11/30/16: PT CURRENTLY REQUIRES MAX a X1 TO DON COMPRESSION WRAPS.   Baseline dependent    Time 2   Period Weeks   Status Partially Met     OT LONG TERM GOAL #2   Title Pt to achieve  15% limb volume reduction in LLE, and 10% reduction in RLE by DC to limit lymphedema (LE) progression and infection risk.- NEW GOAL 25%. 11/30/16: PT HAS MET 10% VOLUMETRIC REDUCTION GOAL BILATERALLY , AND, TO DATE, LLE "BELOW THE KNEE" (A-D) VOLUMETRIC REDUCTION ACHIEVED MEASURES 20.76%.  LLE "TOES TO GROIN" (A-G) LIMB VOLUME REDUCTION MEASURES 15.67% SINCE INITIALLY MEASURED FOR EPISODE 2 ON 08/27/16.   Baseline dependent   Time 12   Period Weeks   Status Achieved     OT LONG TERM GOAL #3   Title Pt IS CONSISTENTLY 85 % compliant with all daily LE self-care protocols w/  MAXIMUM  caregiver assistance, including simple self-manual lymphatic drainage (MLD), skin care, lymphatic pumping therex, and donning/ doffing progression garments.. 11/30/16: GOAL MODIFIED TO REFLECT MORE REALISTI, SUSTAINABLE, DEGREE OF SUCCESSFUL LE SELF MANAGEMENT OVER TIME. DECREASED CONSISTENT COMPLIANCE FROM 100% TO 85%, AND PT REQUIRES MAX ASSIST FROM CAREGIVER INSTEAD OF MODERATE ASSISTANCE.    Baseline dependent   Time 12   Period Weeks   Status Achieved     OT LONG TERM GOAL #5   Title Pt to remain infection free throughout CDT course to limit infection and LE progression. 11/30/16: ONGOING GOAL MET. NO INFECTIONS SINCE INITIALLY COMMENCING OT FOR BLE CDT IN 2016   Baseline dependent   Time 12   Period Weeks   Status On-going     OT LONG TERM GOAL #6   Title During Management Phase CDT Pt to sustain limb volume reductions achieved during Intensive Phase CDT within 5% utilizing LE self-care protocols, appropriate compression garments/ devices, and moderate caregiver assistance.   Baseline dependent   Time 6   Status On-going               Plan - 02/03/17 1224    Clinical Impression Statement Pilar Plate discussion with Pt throughoiut session reitterating importance of compliance with compression wrapping between sessions, and for all LE self care protocols for optimal custom garment fit and function. Pt's mother  reported today that Pt has been less than compliant between sessions. LLE swelling is increased again today such that DME vendor rescheduled garment measurements. This process has been extended beyond reasonable amout of time by vendor , so additional delay due to Pt non-compliance is frustrating for all. Pt agrees to improve compliance after skilled review of wear and care plan. Cont AS PER  POC. Reassess compliance with LE self care . If by end of next week compliance has not increased to 85% or >, then decrease  OT frequency to 1 x weekly and move towards DC. If compliance improves, cont as per POC.   Rehab Potential Good   Clinical Impairments Affecting Rehab Potential see SUBJECTIVE section for list of functional impairments 2/2 BLE LE   OT Frequency 3x / week   OT Duration 12 weeks   OT Treatment/Interventions Self-care/ADL training;Therapeutic exercise;Patient/family education;Manual Therapy;Manual lymph drainage;Therapeutic exercises;DME and/or AE instruction;Compression bandaging;Therapeutic activities;Scar mobilization;Other (comment)  skin care to limit infection risk and improve flexibility/ AROM   OT Home Exercise Plan 2 x daily lymphatic pumping ther ex- BLE- 2 sets of 10 reps. Daily simple self MLD to BLE   Consulted and Agree with Plan of Care Patient;Family member/caregiver      Patient will benefit from skilled therapeutic intervention in order to improve the following deficits and impairments:  Abnormal gait, Decreased skin integrity, Decreased knowledge of precautions, Decreased scar mobility, Impaired perceived functional ability, Improper body mechanics, Decreased activity tolerance, Decreased knowledge of use of DME, Impaired flexibility, Decreased balance, Difficulty walking, Obesity, Decreased range of motion, Increased edema, Pain, Improper spinal/pelvic alignment  Visit Diagnosis: Lymphedema, not elsewhere classified    Problem List Patient Active Problem List    Diagnosis Date Noted  . Episodic tension-type headache, not intractable 07/22/2015  . Hydrocephalus, adult 09/10/2014  . Glaucoma 08/22/2014  . Preventative health care 05/18/2011  . Lymphedema 08/06/2006    Andrey Spearman, MS, OTR/L, CLT-LANA 02/03/17 12:30 PM  [place] MAIN [facility] SERVICES [address] [place], [place], [address] Phone: [phone]   Fax:  [phone]  Name: Alison Fox MRN:  [id number] Date of Birth: [birth year]

## 2017-02-10 ENCOUNTER — Ambulatory Visit: Payer: Medicare Other | Admitting: Occupational Therapy

## 2017-02-10 DIAGNOSIS — I890 Lymphedema, not elsewhere classified: Secondary | ICD-10-CM | POA: Diagnosis not present

## 2017-02-10 NOTE — Therapy (Signed)
[place] MAIN [facility] SERVICES [address] [place], [place], [address] Phone: [phone]   Fax:  [phone]  Occupational Therapy Treatment  Patient Details  Name: Alison Fox MRN: [id number] Date of Birth: 07/01/1971 No Data Recorded  Encounter Date: 02/10/2017      OT End of Session - 02/10/17 1650    Visit Number 36   Number of Visits 36   Date for OT Re-Evaluation 05/11/17   Authorization Type 27/36 (visit 15 for 2018)   OT Start Time 1005   OT Stop Time 1105   OT Time Calculation (min) 60 min   Activity Tolerance Patient tolerated treatment well;No increased pain   Behavior During Therapy WFL for tasks assessed/performed      Past Medical History:  Diagnosis Date  . Glaucoma 08/22/2014  . Headache   . 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 On 01/21/15, she saw Dr. Kathyrn Sheriff at [facility].  She had a repeat MRI of the brain without contrast which demonstrated symmetric ventriculomegaly of the lateral ventricles and third ventricle without significant transependymal flow.  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  Endoscopic third ventriculostomy was offered but not insisted since her only symptoms are headache, which is now mild.  She did not wish to proceed with this procedure.  . Lymphedema    Chronic following left knee surgery in 2000.  . Varicose veins     Past Surgical History:  Procedure Laterality Date  . KNEE ARTHROSCOPY  2000   Left knee.     There were no vitals filed for this visit.      Subjective Assessment - 02/10/17 1630    Subjective  (P)  Pt presents for OT visit 35/72 (16 for 2018) for CDT to LLE. Pt is accompanied by her mother today. Pt reports she went to DME vendor on Tuesday, but vendor told her ,"to return in a week because my eg is too swollen."  Measurements for LLE compression stocking was not completed.   Patient is accompained by: (P)  Family member   Pertinent History (P)  onset in 80s and positive family history for maternal grandmother and cousin suggests primary LE Tarda; arthroscopic knee sx ~ 2000?, recent fall w/ head laceration. 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   Limitations (P)  difficulty walking, decreased balance, suspected BLE suspected, R>L,     Special Tests (P)  get the swelling down and keep it down   Patient Stated Goals (P)  replace my stockings and get the swelling down again.   Pain Onset (P)  More than a month ago                      OT Treatments/Exercises (OP) - 02/10/17 0001      ADLs   ADL Education Given Yes     Manual Therapy   Manual Therapy Edema management;Manual Lymphatic Drainage (MLD);Compression Bandaging;Soft tissue mobilization;Other (comment)   Manual therapy comments LLE skin care as established   Manual Lymphatic Drainage (MLD) Manual lymph drainage (MLD) to LLE in supine utilizing functional inguinal lymph nodes and deep abdominal lymphatics as is customary for non-cancer related lower extremity LE, including bilateral "  short neck" sequence, J strokes to sub and supraclavicular LN, deep abdominal pathways, functional inguinal LN, lower extremity proximal to distal w/ emphasis on medial knee bottleneck and politeal LN. Performed fibrosis technique to B maleoli and distal  legs to address fatty fibrosis. Good tolerance.   Compression Bandaging LLE multi layer compression wrap using  gradient technique- as established- on LLE; new ccl 2 knee high compression garment on RLE                OT Education - 02/10/17 1649    Education provided Yes   Education Details Continued skilled  Pt/caregiver education  And LE ADL training throughout visit for lymphedema self care/ home program, including compression wrapping, compression garment and device wear/care, lymphatic pumping ther ex, simple self-MLD, and skin care. Discussed progress towards goals.   Person(s) Educated Patient;Parent(s)   Methods Explanation   Comprehension Verbalized understanding          OT Short Term Goals - 09/01/16 1356      OT SHORT TERM GOAL #1   Title Lymphedema (LE) management/ self-care: Pt able to apply multi layered, gradient compression wraps with MAX caregiver assistance using proper techniques within 2 weeks to achieve optimal limb volume reduction.   Baseline max caregiver assist; caregiver min assist   Time 2   Period Weeks   Status New     OT SHORT TERM GOAL #2   Title Lymphedema (LE) management/ self-care:  Pt to achieve at least 10% LLE limb volume reductions bilaterally during Intensive CDT to limit LE progression, decrease infection and falls risk, to reduce pain/, and to improve safe ambulation and functional mobility.   Baseline max assist   Time 12   Period Weeks   Status New     OT SHORT TERM GOAL #3   Title Lymphedema (LE) management/ self-care:  Pt >/= 85 % compliant with all daily, LE self-care protocols for home program w/ needed level of caregiver assistance , including simple self-manual lymphatic drainage (MLD), skin care, lymphatic pumping the ex, skin care, and donning/ doffing compression wraps and garments o limit LE progression and further functional decline.     Baseline Max A   Time 12   Period Weeks   Status New     OT SHORT TERM GOAL #4   Title Lymphedema (LE) management/ self-care:  Pt to tolerate daily compression wraps, garments and devices in keeping w/ prescribed wear regime within 1 week of issue date to progress and retain clinical and functional gains and to limit LE progression.   Baseline mod A   Time 12   Period Weeks   Status New     OT  SHORT TERM GOAL #5   Title Lymphedema (LE) self-care:  During Management Phase CDT Pt to sustain limb volume reductions achieved during Intensive Phase CDT within 5% utilizing LE self-care protocols, appropriate compression garments/ devices, and needed level of caregiver assistance.   Baseline Max A   Time 6   Period Months   Status New           OT Long Term Goals - 11/30/16 1035      OT LONG TERM GOAL #1   Title Pt able to correctly apply gradient compression wraps to below knee with moderate caregiver assistance for optimal limb volume reduction and improvement in tissue integrity. 11/30/16: PT CURRENTLY REQUIRES MAX a X1 TO DON COMPRESSION WRAPS.   Baseline dependent    Time 2   Period Weeks  Status Partially Met     OT LONG TERM GOAL #2   Title Pt to achieve 15% limb volume reduction in LLE, and 10% reduction in RLE by DC to limit lymphedema (LE) progression and infection risk.- NEW GOAL 25%. 11/30/16: PT HAS MET 10% VOLUMETRIC REDUCTION GOAL BILATERALLY , AND, TO DATE, LLE "BELOW THE KNEE" (A-D) VOLUMETRIC REDUCTION ACHIEVED MEASURES 20.76%.  LLE "TOES TO GROIN" (A-G) LIMB VOLUME REDUCTION MEASURES 15.67% SINCE INITIALLY MEASURED FOR EPISODE 2 ON 08/27/16.   Baseline dependent   Time 12   Period Weeks   Status Achieved     OT LONG TERM GOAL #3   Title Pt IS CONSISTENTLY 85 % compliant with all daily LE self-care protocols w/  MAXIMUM caregiver assistance, including simple self-manual lymphatic drainage (MLD), skin care, lymphatic pumping therex, and donning/ doffing progression garments.. 11/30/16: GOAL MODIFIED TO REFLECT MORE REALISTI, SUSTAINABLE, DEGREE OF SUCCESSFUL LE SELF MANAGEMENT OVER TIME. DECREASED CONSISTENT COMPLIANCE FROM 100% TO 85%, AND PT REQUIRES MAX ASSIST FROM CAREGIVER INSTEAD OF MODERATE ASSISTANCE.    Baseline dependent   Time 12   Period Weeks   Status Achieved     OT LONG TERM GOAL #5   Title Pt to remain infection free throughout CDT course to limit  infection and LE progression. 11/30/16: ONGOING GOAL MET. NO INFECTIONS SINCE INITIALLY COMMENCING OT FOR BLE CDT IN 2016   Baseline dependent   Time 12   Period Weeks   Status On-going     OT LONG TERM GOAL #6   Title During Management Phase CDT Pt to sustain limb volume reductions achieved during Intensive Phase CDT within 5% utilizing LE self-care protocols, appropriate compression garments/ devices, and moderate caregiver assistance.   Baseline dependent   Time 6   Status On-going               Plan - 02/10/17 1653    Clinical Impression Statement Pt's LLE is less swollen today and tissue density is decreased since last visit. Pt reports improved compliance with daily wrapping since last visit in preparation for garment measurements . OT called garment vendor to check status of measurement plan and vendor set Pt up for measurement today after OT session. Cont as per POC.    Rehab Potential Good   Clinical Impairments Affecting Rehab Potential see SUBJECTIVE section for list of functional impairments 2/2 BLE LE   OT Frequency 3x / week   OT Duration 12 weeks   OT Treatment/Interventions Self-care/ADL training;Therapeutic exercise;Patient/family education;Manual Therapy;Manual lymph drainage;Therapeutic exercises;DME and/or AE instruction;Compression bandaging;Therapeutic activities;Scar mobilization;Other (comment)  skin care to limit infection risk and improve flexibility/ AROM   OT Home Exercise Plan 2 x daily lymphatic pumping ther ex- BLE- 2 sets of 10 reps. Daily simple self MLD to BLE   Consulted and Agree with Plan of Care Patient;Family member/caregiver      Patient will benefit from skilled therapeutic intervention in order to improve the following deficits and impairments:  Abnormal gait, Decreased skin integrity, Decreased knowledge of precautions, Decreased scar mobility, Impaired perceived functional ability, Improper body mechanics, Decreased activity tolerance,  Decreased knowledge of use of DME, Impaired flexibility, Decreased balance, Difficulty walking, Obesity, Decreased range of motion, Increased edema, Pain, Improper spinal/pelvic alignment  Visit Diagnosis: Lymphedema, not elsewhere classified - Plan: Ot plan of care cert/re-cert    Problem List Patient Active Problem List   Diagnosis Date Noted  . Episodic tension-type headache, not intractable 07/22/2015  . Hydrocephalus, adult 09/10/2014  .  Glaucoma 08/22/2014  . Preventative health care 05/18/2011  . Lymphedema 08/06/2006    Andrey Spearman, MS, OTR/L, [facility] 02/10/17 5:08 PM  [place] MAIN [facility] SERVICES [address] [place], [place], [address] Phone: [phone]   Fax:  [phone]  Name: Alison Fox MRN: [id number] Date of Birth: 27-Jun-1971

## 2017-02-17 ENCOUNTER — Ambulatory Visit
Payer: Medicare Other | Attending: Student in an Organized Health Care Education/Training Program | Admitting: Occupational Therapy

## 2017-02-17 DIAGNOSIS — I890 Lymphedema, not elsewhere classified: Secondary | ICD-10-CM | POA: Insufficient documentation

## 2017-02-18 NOTE — Patient Instructions (Signed)

## 2017-02-18 NOTE — Therapy (Signed)
[place] MAIN [facility] SERVICES [address] [place], [place], [address] Phone: [phone]   Fax:  [phone]  Occupational Therapy Treatment Note and Progress Report  Patient Details  Name: Alison Fox MRN: [id number] Date of Birth: 1971-07-26 No Data Recorded  Encounter Date: 02/17/2017      OT End of Session - 02/17/17 1024    Visit Number 36   Number of Visits 72   Date for OT Re-Evaluation 05/18/17   Authorization Type 27/36 (visit 15 for 2018)   OT Start Time 1015   OT Stop Time 1104   OT Time Calculation (min) 49 min   Activity Tolerance Patient tolerated treatment well;No increased pain   Behavior During Therapy WFL for tasks assessed/performed      Past Medical History:  Diagnosis Date  . Glaucoma 08/22/2014  . Headache   . 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 On 01/21/15, she saw Dr. Kathyrn Sheriff at [facility].  She had a repeat MRI of the brain without contrast which demonstrated symmetric ventriculomegaly of the lateral ventricles and third ventricle without significant transependymal flow.  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  Endoscopic third ventriculostomy was offered but not insisted since her only symptoms are headache, which is now mild.  She did not wish to proceed with this procedure.  . Lymphedema    Chronic following left knee surgery in 2000.  . Varicose veins     Past Surgical History:  Procedure Laterality Date  . KNEE ARTHROSCOPY  2000   Left knee.     There were no vitals filed for this visit.      Subjective Assessment - 02/18/17 1349    Subjective  Pt presents for OT visit 36/72 (16 for 2018) for CDT to LLE. Pt is accompanied by her mother today. Pt reports she returned to DME vendor after last OT session and vendor completed measurements for remake  on custom garment initially fitted in March. A significant delay  in custom compression garment delivery and remake fitting by DME vendor has extended OT treatment plan well beyond initial estimate for POC.    Patient is accompained by: Family member   Pertinent History onset in 39s and positive family history for maternal grandmother and cousin suggests primary LE Tarda; arthroscopic knee sx ~ 2000?, recent fall w/ head laceration. 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   Limitations difficulty walking, decreased balance, suspected BLE suspected, R>L,     Special Tests get the swelling down and keep it down   Patient Stated Goals replace my stockings and get the swelling down again.   Pain Onset More than a month ago                      OT Treatments/Exercises (OP) - 02/18/17 0001      ADLs   ADL Education Given Yes     Manual Therapy   Manual Therapy Edema management;Manual Lymphatic Drainage (MLD);Compression Bandaging;Soft tissue mobilization;Other (comment)   Manual therapy comments LLE skin care as established   Soft tissue mobilization fibrosis technique at L medial and lateral malleolii, scar massage   Manual Lymphatic Drainage (MLD) Manual lymph drainage (MLD)  to LLE in supine utilizing functional inguinal lymph nodes and deep abdominal lymphatics as is customary for non-cancer related lower extremity LE, including bilateral "short neck" sequence, J strokes to sub and supraclavicular LN, deep abdominal pathways, functional inguinal LN, lower extremity proximal to distal w/ emphasis on medial knee bottleneck and politeal LN. Performed fibrosis technique to B maleoli and distal  legs to address fatty fibrosis. Good tolerance.   Compression Bandaging LLE multi layer compression wrap using   gradient technique- as established- on LLE; new ccl 2 knee high compression garment on RLE                OT Education - 02/18/17 1352    Education provided Yes   Person(s) Educated Patient;Parent(s)   Methods Explanation;Demonstration;Tactile cues;Verbal cues   Comprehension Verbalized understanding;Returned demonstration          OT Short Term Goals - 09/01/16 1356      OT SHORT TERM GOAL #1   Title Lymphedema (LE) management/ self-care: Pt able to apply multi layered, gradient compression wraps with MAX caregiver assistance using proper techniques within 2 weeks to achieve optimal limb volume reduction.   Baseline max caregiver assist; caregiver min assist   Time 2   Period Weeks   Status New     OT SHORT TERM GOAL #2   Title Lymphedema (LE) management/ self-care:  Pt to achieve at least 10% LLE limb volume reductions bilaterally during Intensive CDT to limit LE progression, decrease infection and falls risk, to reduce pain/, and to improve safe ambulation and functional mobility.   Baseline max assist   Time 12   Period Weeks   Status New     OT SHORT TERM GOAL #3   Title Lymphedema (LE) management/ self-care:  Pt >/= 85 % compliant with all daily, LE self-care protocols for home program w/ needed level of caregiver assistance , including simple self-manual lymphatic drainage (MLD), skin care, lymphatic pumping the ex, skin care, and donning/ doffing compression wraps and garments o limit LE progression and further functional decline.     Baseline Max A   Time 12   Period Weeks   Status New     OT SHORT TERM GOAL #4   Title Lymphedema (LE) management/ self-care:  Pt to tolerate daily compression wraps, garments and devices in keeping w/ prescribed wear regime within 1 week of issue date to progress and retain clinical and functional gains and to limit LE progression.   Baseline mod A   Time 12   Period Weeks   Status New     OT SHORT TERM GOAL #5   Title  Lymphedema (LE) self-care:  During Management Phase CDT Pt to sustain limb volume reductions achieved during Intensive Phase CDT within 5% utilizing LE self-care protocols, appropriate compression garments/ devices, and needed level of caregiver assistance.   Baseline Max A   Time 6   Period Months   Status New           OT Long Term Goals - 02/18/17 1356      OT LONG TERM GOAL #1   Title Pt able to correctly apply gradient compression wraps to below knee with moderate caregiver assistance for optimal limb volume reduction and improvement in tissue integrity. 11/30/16: PT CURRENTLY REQUIRES MAX a X1 TO DON COMPRESSION WRAPS.   Baseline dependent    Time 2   Period Weeks   Status Achieved     OT LONG TERM GOAL #2  Title Pt to achieve 15% limb volume reduction in LLE, and 10% reduction in RLE by DC to limit lymphedema (LE) progression and infection risk.- NEW GOAL 25%. 11/30/16: PT HAS MET 10% VOLUMETRIC REDUCTION GOAL BILATERALLY , AND, TO DATE, LLE "BELOW THE KNEE" (A-D) VOLUMETRIC REDUCTION ACHIEVED MEASURES 20.76%.  LLE "TOES TO GROIN" (A-G) LIMB VOLUME REDUCTION MEASURES 15.67% SINCE INITIALLY MEASURED FOR EPISODE 2 ON 08/27/16.   Baseline dependent   Time 12   Period Weeks   Status Achieved     OT LONG TERM GOAL #3   Title Pt IS CONSISTENTLY 85 % compliant with all daily LE self-care protocols w/  MAXIMUM caregiver assistance, including simple self-manual lymphatic drainage (MLD), skin care, lymphatic pumping therex, and donning/ doffing progression garments.. 11/30/16: GOAL MODIFIED TO REFLECT MORE REALISTI, SUSTAINABLE, DEGREE OF SUCCESSFUL LE SELF MANAGEMENT OVER TIME. DECREASED CONSISTENT COMPLIANCE FROM 100% TO 85%, AND PT REQUIRES MAX ASSIST FROM CAREGIVER INSTEAD OF MODERATE ASSISTANCE.    Baseline dependent   Time 12   Period Weeks   Status Achieved     OT LONG TERM GOAL #5   Title Pt to remain infection free throughout CDT course to limit infection and LE progression.     Baseline dependent   Time 12   Period Weeks   Status Not Met     OT LONG TERM GOAL #6   Title During Management Phase CDT Pt to sustain limb volume reductions achieved during Intensive Phase CDT within 5% utilizing LE self-care protocols, appropriate compression garments/ devices, and moderate caregiver assistance.   Baseline dependent   Time 6   Status On-going               Plan - 02/17/17 1352    Clinical Impression Statement LLE limb swelling and tissue density continues to fluctuate. Parent reports that Pt has been much less compliant w/ compression wraps lately . By report Pt has not been wearing compression wraps to work and has not been allowing her mother to apply compression wraps after work. Additionally,  significant delay  in custom compression garment delivery and remake fitting by DME vendor has extended OT treatment plan well beyond initial estimate for POC.  DME vendor reports custom LLE garments was ordered last Friday, despite it being initially submitted for order on 01/04/2017. Pt will benefit from extending POC until ALL CUSTOM COMPRESSION GARments are delivered and fitted properly. Pt agrees with plan to decrease OT frequency to 1 x weekly while increasing compliance with all self care protocols at hhome during visit intervals. OT requests additional visits to be used PRN to complete CDT Intensive Phase treatment for optimal clinical and functional outcomes.   Rehab Potential Good   Clinical Impairments Affecting Rehab Potential see SUBJECTIVE section for list of functional impairments 2/2 BLE LE   OT Frequency 3x / week   OT Duration 12 weeks   OT Treatment/Interventions Self-care/ADL training;Therapeutic exercise;Patient/family education;Manual Therapy;Manual lymph drainage;Therapeutic exercises;DME and/or AE instruction;Compression bandaging;Therapeutic activities;Scar mobilization;Other (comment)  skin care to limit infection risk and improve flexibility/ AROM    OT Home Exercise Plan 2 x daily lymphatic pumping ther ex- BLE- 2 sets of 10 reps. Daily simple self MLD to BLE   Consulted and Agree with Plan of Care Patient;Family member/caregiver      Patient will benefit from skilled therapeutic intervention in order to improve the following deficits and impairments:  Abnormal gait, Decreased skin integrity, Decreased knowledge of precautions, Decreased scar mobility, Impaired perceived functional ability,  Improper body mechanics, Decreased activity tolerance, Decreased knowledge of use of DME, Impaired flexibility, Decreased balance, Difficulty walking, Obesity, Decreased range of motion, Increased edema, Pain, Improper spinal/pelvic alignment  Visit Diagnosis: Lymphedema, not elsewhere classified - Plan: Ot plan of care cert/re-cert    Problem List Patient Active Problem List   Diagnosis Date Noted  . Episodic tension-type headache, not intractable 07/22/2015  . Hydrocephalus, adult 09/10/2014  . Glaucoma 08/22/2014  . Preventative health care 05/18/2011  . Lymphedema 08/06/2006    Andrey Spearman, MS, OTR/L, [facility] 02/18/17 2:16 PM  [place] MAIN [facility] SERVICES [address] [place], [place], [address] Phone: [phone]   Fax:  [phone]  Name: Alison Fox MRN: [id number] Date of Birth: December 03, 1970

## 2017-02-24 ENCOUNTER — Ambulatory Visit: Payer: Medicare Other | Admitting: Occupational Therapy

## 2017-02-24 DIAGNOSIS — I890 Lymphedema, not elsewhere classified: Secondary | ICD-10-CM | POA: Diagnosis not present

## 2017-02-24 NOTE — Therapy (Signed)
[place] MAIN [facility] SERVICES [address] [place], [place], [address] Phone: [phone]   Fax:  [phone]  Occupational Therapy Treatment  Patient Details  Name: Alison Fox MRN: [id number] Date of Birth: 18-Apr-1971 No Data Recorded  Encounter Date: 02/24/2017      OT End of Session - 02/24/17 1353    Visit Number 37   Number of Visits 58   Date for OT Re-Evaluation 05/11/17   Authorization Type 27/36 (visit 15 for 2018)   OT Start Time 0105   OT Stop Time 0205   OT Time Calculation (min) 60 min   Activity Tolerance Patient tolerated treatment well;No increased pain   Behavior During Therapy WFL for tasks assessed/performed      Past Medical History:  Diagnosis Date  . Glaucoma 08/22/2014  . Headache   . 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 On 01/21/15, she saw Dr. Kathyrn Sheriff at [facility].  She had a repeat MRI of the brain without contrast which demonstrated symmetric ventriculomegaly of the lateral ventricles and third ventricle without significant transependymal flow.  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  Endoscopic third ventriculostomy was offered but not insisted since her only symptoms are headache, which is now mild.  She did not wish to proceed with this procedure.  . Lymphedema    Chronic following left knee surgery in 2000.  . Varicose veins     Past Surgical History:  Procedure Laterality Date  . KNEE ARTHROSCOPY  2000   Left knee.     There were no vitals filed for this visit.      Subjective Assessment - 02/24/17 1315    Subjective  Pt presents for OT visit 37/72 (17 for 2018) for CDT to LLE. Pt is accompanied by her mother today. Pt reports she returned to DME vendor after last OT session and vendor completed measurements for remake on custom garment  initially fitted in March. A significant delay  in custom compression garment delivery and remake fitting by DME vendor has extended OT treatment plan well beyond initial estimate for POC.    Patient is accompained by: Family member   Pertinent History onset in 1s and positive family history for maternal grandmother and cousin suggests primary LE Tarda; arthroscopic knee sx ~ 2000?, recent fall w/ head laceration. 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   Limitations difficulty walking, decreased balance, suspected BLE suspected, R>L,     Special Tests get the swelling down and keep it down   Patient Stated Goals replace my stockings and get the swelling down again.   Currently in Pain? No/denies   Pain Onset More than a month ago                      OT Treatments/Exercises (OP) - 02/24/17 0001      ADLs   ADL Education Given Yes     Manual Therapy   Manual Therapy Edema management;Manual Lymphatic Drainage (MLD);Compression Bandaging;Soft tissue mobilization;Other (comment)   Manual therapy comments LLE skin care as established   Soft tissue mobilization fibrosis technique at L medial and lateral malleolii, scar massage   Manual Lymphatic Drainage (MLD) Manual lymph  drainage (MLD) to LLE in supine utilizing functional inguinal lymph nodes and deep abdominal lymphatics as is customary for non-cancer related lower extremity LE, including bilateral "short neck" sequence, J strokes to sub and supraclavicular LN, deep abdominal pathways, functional inguinal LN, lower extremity proximal to distal w/ emphasis on medial knee bottleneck and politeal LN. Performed fibrosis technique to B maleoli and distal  legs to address fatty fibrosis. Good tolerance.   Compression Bandaging LLE multi layer compression  wrap using  gradient technique- as established- on LLE; new ccl 2 knee high compression garment on RLE                OT Education - 02/24/17 1316    Education provided Yes   Education Details Continued skilled Pt/caregiver education  And LE ADL training throughout visit for lymphedema self care/ home program, including compression wrapping, compression garment and device wear/care, lymphatic pumping ther ex, simple self-MLD, and skin care. Discussed progress towards goals.   Person(s) Educated Patient;Parent(s)   Methods Explanation   Comprehension Verbalized understanding          OT Short Term Goals - 09/01/16 1356      OT SHORT TERM GOAL #1   Title Lymphedema (LE) management/ self-care: Pt able to apply multi layered, gradient compression wraps with MAX caregiver assistance using proper techniques within 2 weeks to achieve optimal limb volume reduction.   Baseline max caregiver assist; caregiver min assist   Time 2   Period Weeks   Status New     OT SHORT TERM GOAL #2   Title Lymphedema (LE) management/ self-care:  Pt to achieve at least 10% LLE limb volume reductions bilaterally during Intensive CDT to limit LE progression, decrease infection and falls risk, to reduce pain/, and to improve safe ambulation and functional mobility.   Baseline max assist   Time 12   Period Weeks   Status New     OT SHORT TERM GOAL #3   Title Lymphedema (LE) management/ self-care:  Pt >/= 85 % compliant with all daily, LE self-care protocols for home program w/ needed level of caregiver assistance , including simple self-manual lymphatic drainage (MLD), skin care, lymphatic pumping the ex, skin care, and donning/ doffing compression wraps and garments o limit LE progression and further functional decline.     Baseline Max A   Time 12   Period Weeks   Status New     OT SHORT TERM GOAL #4   Title Lymphedema (LE) management/ self-care:  Pt to tolerate daily compression wraps, garments and  devices in keeping w/ prescribed wear regime within 1 week of issue date to progress and retain clinical and functional gains and to limit LE progression.   Baseline mod A   Time 12   Period Weeks   Status New     OT SHORT TERM GOAL #5   Title Lymphedema (LE) self-care:  During Management Phase CDT Pt to sustain limb volume reductions achieved during Intensive Phase CDT within 5% utilizing LE self-care protocols, appropriate compression garments/ devices, and needed level of caregiver assistance.   Baseline Max A   Time 6   Period Months   Status New           OT Long Term Goals - 02/18/17 1356      OT LONG TERM GOAL #1   Title Pt able to correctly apply gradient compression wraps to below knee with moderate caregiver assistance for optimal limb volume reduction and improvement in  tissue integrity. 11/30/16: PT CURRENTLY REQUIRES MAX a X1 TO DON COMPRESSION WRAPS.   Baseline dependent    Time 2   Period Weeks   Status Achieved     OT LONG TERM GOAL #2   Title Pt to achieve 15% limb volume reduction in LLE, and 10% reduction in RLE by DC to limit lymphedema (LE) progression and infection risk.- NEW GOAL 25%. 11/30/16: PT HAS MET 10% VOLUMETRIC REDUCTION GOAL BILATERALLY , AND, TO DATE, LLE "BELOW THE KNEE" (A-D) VOLUMETRIC REDUCTION ACHIEVED MEASURES 20.76%.  LLE "TOES TO GROIN" (A-G) LIMB VOLUME REDUCTION MEASURES 15.67% SINCE INITIALLY MEASURED FOR EPISODE 2 ON 08/27/16.   Baseline dependent   Time 12   Period Weeks   Status Achieved     OT LONG TERM GOAL #3   Title Pt IS CONSISTENTLY 85 % compliant with all daily LE self-care protocols w/  MAXIMUM caregiver assistance, including simple self-manual lymphatic drainage (MLD), skin care, lymphatic pumping therex, and donning/ doffing progression garments.. 11/30/16: GOAL MODIFIED TO REFLECT MORE REALISTI, SUSTAINABLE, DEGREE OF SUCCESSFUL LE SELF MANAGEMENT OVER TIME. DECREASED CONSISTENT COMPLIANCE FROM 100% TO 85%, AND PT REQUIRES MAX  ASSIST FROM CAREGIVER INSTEAD OF MODERATE ASSISTANCE.    Baseline dependent   Time 12   Period Weeks   Status Achieved     OT LONG TERM GOAL #5   Title Pt to remain infection free throughout CDT course to limit infection and LE progression.    Baseline dependent   Time 12   Period Weeks   Status Not Met     OT LONG TERM GOAL #6   Title During Management Phase CDT Pt to sustain limb volume reductions achieved during Intensive Phase CDT within 5% utilizing LE self-care protocols, appropriate compression garments/ devices, and moderate caregiver assistance.   Baseline dependent   Time 6   Status On-going               Plan - 02/24/17 1406    Clinical Impression Statement LLE presents with increased swelling below the knee again today. Ptw/ improved compliance with compression wraps and RLE HOS device during visit interval. Pt brings initialled calensdar showing that she wore HOS device every night since last visit as instructed. Pt  tolerated MLD , simultaneous skin care and compression wrapping today without difficulty. Pt will continue to check off calener for HOS device and compression wrapping daily until next visit. Cont as per POC.   Rehab Potential Good   Clinical Impairments Affecting Rehab Potential see SUBJECTIVE section for list of functional impairments 2/2 BLE LE   OT Frequency 3x / week   OT Duration 12 weeks   OT Treatment/Interventions Self-care/ADL training;Therapeutic exercise;Patient/family education;Manual Therapy;Manual lymph drainage;Therapeutic exercises;DME and/or AE instruction;Compression bandaging;Therapeutic activities;Scar mobilization;Other (comment)  skin care to limit infection risk and improve flexibility/ AROM   OT Home Exercise Plan 2 x daily lymphatic pumping ther ex- BLE- 2 sets of 10 reps. Daily simple self MLD to BLE   Consulted and Agree with Plan of Care Patient;Family member/caregiver      Patient will benefit from skilled therapeutic  intervention in order to improve the following deficits and impairments:  Abnormal gait, Decreased skin integrity, Decreased knowledge of precautions, Decreased scar mobility, Impaired perceived functional ability, Improper body mechanics, Decreased activity tolerance, Decreased knowledge of use of DME, Impaired flexibility, Decreased balance, Difficulty walking, Obesity, Decreased range of motion, Increased edema, Pain, Improper spinal/pelvic alignment  Visit Diagnosis: Lymphedema, not elsewhere classified  Problem List Patient Active Problem List   Diagnosis Date Noted  . Episodic tension-type headache, not intractable 07/22/2015  . Hydrocephalus, adult 09/10/2014  . Glaucoma 08/22/2014  . Preventative health care 05/18/2011  . Lymphedema 08/06/2006    Andrey Spearman, MS, OTR/L, CLT-LANA 02/24/17 3:10 PM  [place] MAIN [facility] SERVICES [address] [place], [place], [address] Phone: [phone]   Fax:  [phone]  Name: Alison Fox MRN: [id number] Date of Birth: 1971-07-26

## 2017-02-26 ENCOUNTER — Encounter: Payer: Medicare Other | Admitting: Occupational Therapy

## 2017-03-03 ENCOUNTER — Ambulatory Visit: Payer: Medicare Other | Admitting: Occupational Therapy

## 2017-03-05 ENCOUNTER — Encounter: Payer: Medicare Other | Admitting: Occupational Therapy

## 2017-03-09 ENCOUNTER — Ambulatory Visit: Payer: Medicare Other | Admitting: Occupational Therapy

## 2017-03-10 ENCOUNTER — Ambulatory Visit (INDEPENDENT_AMBULATORY_CARE_PROVIDER_SITE_OTHER): Payer: Medicare Other | Admitting: Internal Medicine

## 2017-03-10 VITALS — BP 110/46 | HR 87 | Temp 98.2°F | Wt 169.5 lb

## 2017-03-10 DIAGNOSIS — G919 Hydrocephalus, unspecified: Secondary | ICD-10-CM

## 2017-03-10 DIAGNOSIS — G911 Obstructive hydrocephalus: Secondary | ICD-10-CM

## 2017-03-10 DIAGNOSIS — I890 Lymphedema, not elsewhere classified: Secondary | ICD-10-CM | POA: Diagnosis not present

## 2017-03-10 NOTE — Progress Notes (Signed)
   CC: Right lower extremity wound HPI: Ms. Alison Fox is a 46 y.o. female with a past medical history as listed below who presents with right lower extremity wound.  Past Medical History:  Diagnosis Date  . Glaucoma 08/22/2014  . Headache   . 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 On 01/21/15, she saw Dr. Conchita ParisNundkumar at [facility].  She had a repeat MRI of the brain without contrast which demonstrated symmetric ventriculomegaly of the lateral ventricles and third ventricle without significant transependymal flow.  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  Endoscopic third ventriculostomy was offered but not insisted since her only symptoms are headache, which is now mild.  She did not wish to proceed with this procedure.  . Lymphedema    Chronic following left knee surgery in 2000.  . Varicose veins      Review of Systems: Patient denies worsening headaches. Patient denies fevers or chills. She denies nausea, vomiting or abdominal pain. Physical Exam: Vitals:   03/10/17 1017  BP: (!) 110/46  Pulse: 87  Temp: 98.2 F (36.8 C)  TempSrc: Oral  SpO2: 99%  Weight: 169 lb 8 oz (76.9 kg)   BP (!) 110/46 (BP Location: Left Arm, Patient Position: Sitting, Cuff Size: Normal)   Pulse 87   Temp 98.2 F (36.8 C) (Oral)   Wt 169 lb 8 oz (76.9 kg)   SpO2 99%   BMI 25.03 kg/m  General appearance: alert and cooperative Head: Normocephalic, without obvious abnormality Lungs: clear to auscultation bilaterally Heart: regular rate and rhythm, S1, S2 normal, no murmur, click, rub or gallop Extremities: Bilateral lower extremity nonpitting edema. Left much greater than right. Left lower extremity wrapped. Right lower extremity with medial wound midway up the lower leg. Some erythema and tenderness to palpation. No evidence of cellulitis. Well-healing  eschar.  Assessment & Plan:  See encounters tab for problem based medical decision making. Patient seen with Dr. Oswaldo DoneVincent  Signed: Thomasene Lotaylor, Jaydan Meidinger, MD 03/10/2017, 10:36 AM  Pager: [phone]

## 2017-03-10 NOTE — Patient Instructions (Signed)
It was a pleasure seeing you today. Thank you for choosing [facility] your [facility] needs.   Please keep the area on your right leg clean and dry. If this area worsens or begins to drain pus please call the clinic for an appointment.  Please schedule an appointment to see Dr. Oswaldo DoneVincent in approximately one month.

## 2017-03-10 NOTE — Assessment & Plan Note (Signed)
Patient with chronic bilateral lower extremity edema. Left much greater than right. She is currently in physical therapy at the lymphedema clinic. She will continue with this clinic. She is currently being fitted for new pressure stockings. Overall, she says this is going well. She presents today with concerns of possible infection on her right lower extremity. She has noticed an area of erythema on the medial aspect midway up the right lower extremity. She states this has been somewhat tender and sore for the prior 3 weeks. Physical examination shows erythema in this area with small area of well healing eschar. No evidence of cellulitis. No active pus draining. I do not think the patient would need antibiotics at this time. I discussed with the patient that if this worsens or begins to drain pus to call the clinic. She endorsed understanding of this plan. For now she will continue with physical therapy and compression stockings. -- Physical therapy lymphedema clinic -- Compression stockings -- Call clinic if area worsening

## 2017-03-10 NOTE — Progress Notes (Signed)
Internal Medicine Clinic Attending  I saw and evaluated the patient.  I personally confirmed the key portions of the history and exam documented by Dr. Taylor and I reviewed pertinent patient test results.  The assessment, diagnosis, and plan were formulated together and I agree with the documentation in the resident's note.  

## 2017-03-10 NOTE — Assessment & Plan Note (Signed)
Patient has chronic severe obstructive hydrocephalus. She follows with neurology. No new headaches or neurological symptoms at this time. Continue current management. -- Nortriptyline 50 mg daily -- Routine follow-up with neurology

## 2017-03-11 ENCOUNTER — Encounter: Payer: Medicare Other | Admitting: Occupational Therapy

## 2017-03-14 ENCOUNTER — Encounter (HOSPITAL_COMMUNITY): Payer: Self-pay | Admitting: Emergency Medicine

## 2017-03-14 ENCOUNTER — Emergency Department (HOSPITAL_COMMUNITY)
Admission: EM | Admit: 2017-03-14 | Discharge: 2017-03-15 | Disposition: A | Discharge status: Home / Self Care | Payer: Medicare Other | Attending: Emergency Medicine | Admitting: Emergency Medicine

## 2017-03-14 DIAGNOSIS — F6381 Intermittent explosive disorder: Secondary | ICD-10-CM | POA: Insufficient documentation

## 2017-03-14 DIAGNOSIS — Z79899 Other long term (current) drug therapy: Secondary | ICD-10-CM | POA: Insufficient documentation

## 2017-03-14 DIAGNOSIS — F4325 Adjustment disorder with mixed disturbance of emotions and conduct: Secondary | ICD-10-CM | POA: Diagnosis not present

## 2017-03-14 DIAGNOSIS — F918 Other conduct disorders: Secondary | ICD-10-CM | POA: Diagnosis present

## 2017-03-14 DIAGNOSIS — R4689 Other symptoms and signs involving appearance and behavior: Secondary | ICD-10-CM

## 2017-03-14 DIAGNOSIS — F4329 Adjustment disorder with other symptoms: Secondary | ICD-10-CM | POA: Diagnosis present

## 2017-03-14 LAB — COMPREHENSIVE METABOLIC PANEL
ALT: 14 U/L (ref 14–54)
ANION GAP: 6 (ref 5–15)
AST: 21 U/L (ref 15–41)
Albumin: 3.7 g/dL (ref 3.5–5.0)
Alkaline Phosphatase: 80 U/L (ref 38–126)
BILIRUBIN TOTAL: 0.3 mg/dL (ref 0.3–1.2)
BUN: 16 mg/dL (ref 6–20)
CO2: 29 mmol/L (ref 22–32)
Calcium: 8.7 mg/dL — ABNORMAL LOW (ref 8.9–10.3)
Chloride: 104 mmol/L (ref 101–111)
Creatinine, Ser: 0.72 mg/dL (ref 0.44–1.00)
Glucose, Bld: 92 mg/dL (ref 65–99)
POTASSIUM: 4 mmol/L (ref 3.5–5.1)
Sodium: 139 mmol/L (ref 135–145)
TOTAL PROTEIN: 6.9 g/dL (ref 6.5–8.1)

## 2017-03-14 LAB — SALICYLATE LEVEL: Salicylate Lvl: <7 mg/dL (ref 2.8–30.0)

## 2017-03-14 LAB — CBC
HCT: 38.7 % (ref 36.0–46.0)
Hemoglobin: 12.8 g/dL (ref 12.0–15.0)
MCH: 29.7 pg (ref 26.0–34.0)
MCHC: 33.1 g/dL (ref 30.0–36.0)
MCV: 89.8 fL (ref 78.0–100.0)
PLATELETS: 223 10*3/uL (ref 150–400)
RBC: 4.31 MIL/uL (ref 3.87–5.11)
RDW: 13 % (ref 11.5–15.5)
WBC: 7 10*3/uL (ref 4.0–10.5)

## 2017-03-14 LAB — ETHANOL

## 2017-03-14 LAB — ACETAMINOPHEN LEVEL: Acetaminophen (Tylenol), Serum: <10 ug/mL — ABNORMAL LOW (ref 10–30)
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
## 2017-03-14 NOTE — ED Notes (Signed)
Bed: [facility] Expected date:  Expected time:  Means of arrival:  Comments: 

## 2017-03-15 DIAGNOSIS — F4329 Adjustment disorder with other symptoms: Secondary | ICD-10-CM | POA: Diagnosis present

## 2017-03-15 DIAGNOSIS — F6381 Intermittent explosive disorder: Secondary | ICD-10-CM

## 2017-03-15 DIAGNOSIS — F4325 Adjustment disorder with mixed disturbance of emotions and conduct: Secondary | ICD-10-CM

## 2017-03-15 HISTORY — DX: Adjustment disorder with mixed disturbance of emotions and conduct: F43.25

## 2017-03-15 LAB — RAPID URINE DRUG SCREEN, HOSP PERFORMED
Amphetamines: NOT DETECTED
BARBITURATES: NOT DETECTED
BENZODIAZEPINES: NOT DETECTED
COCAINE: NOT DETECTED
OPIATES: NOT DETECTED
TETRAHYDROCANNABINOL: NOT DETECTED

## 2017-03-15 MED ORDER — OLOPATADINE HCL 0.1 % OP SOLN
1.0000 [drp] | Freq: Two times a day (BID) | OPHTHALMIC | Status: DC
  Administered 2017-03-15: 1 [drp] via OPHTHALMIC
  Filled 2017-03-15: qty 5

## 2017-03-15 MED ORDER — LAMOTRIGINE 25 MG PO TABS
25.0000 mg | ORAL_TABLET | Freq: Every day | ORAL | Status: DC
  Administered 2017-03-15: 25 mg via ORAL
  Filled 2017-03-15: qty 1

## 2017-03-15 MED ORDER — LAMOTRIGINE 25 MG PO TABS: 25.0000 mg | ORAL_TABLET | Freq: Every day | ORAL | 0 refills | Status: DC

## 2017-03-15 MED ORDER — NORTRIPTYLINE HCL 25 MG PO CAPS: 100.0000 mg | ORAL_CAPSULE | Freq: Every day | ORAL | Status: DC
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
## 2017-03-15 NOTE — Progress Notes (Signed)
ED CM contacted by Jan[facility]arding patient needing referral for neurology follow up, [facility]. Suggested patient contact [facility] info placed on AVS, or patient can speak with PCP for a referral. No further ED CM needs identified.
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
## 2017-03-15 NOTE — Discharge Instructions (Signed)
For your ongoing behavioral health needs you are advised to follow up with [facility].  New patients are seen at their walk-in clinic.  Walk-in hours are Monday - Friday from 8:00 am - 12:00 pm, and from 1:00 pm - 3:00 pm.  Walk-in patients are seen on a first come, first served basis, so try to arrive as early as possible for the best chance of being seen the same day.  There is an initial fee of $22.50: ° °     [facility] °     [address] °     [place], [place] [address] °     [phone] °
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
## 2017-03-15 NOTE — Care Management Note (Deleted)
Case Management Note  Patient Details  Name: Alison SeashoreFrances Milosevic MRN: [id number] Date of Birth: Jan 26, 1971  Subjective/Objective:                    Action/Plan:   Expected Discharge Date:                  Expected Discharge Plan:     In-House Referral:     Discharge planning Services     Post Acute Care Choice:    Choice offered to:     DME Arranged:    DME Agency:     HH Arranged:    HH Agency:     Status of Service:     If discussed at [employer] Meetings, dates discussed:    Additional CommentsMichel Bickers:  Kynsli Haapala, RN 03/15/2017, 12:54 PM

## 2017-03-15 NOTE — BH Assessment (Signed)
[facility] Assessment Progress Note  Per Thedore MinsMojeed Akintayo, MD, this pt does not require psychiatric hospitalization at this time.  Pt presents under IVC initiated by her father, which Dr Jannifer FranklinAkintayo has rescinded.  [facility]  This has been included in pt's discharge instructions.  Pt's nurse has been notified.  Doylene Canninghomas Azalya Galyon, MA Triage Specialist [phone]

## 2017-03-15 NOTE — ED Notes (Signed)
Pt ambulatory and independent at discharge.  Pt and caregiver verbalized understanding of discharge instructions.

## 2017-03-15 NOTE — ED Notes (Signed)
Bed: WA11 Expected date:  Expected time:  Means of arrival:  Comments: Triage 3 

## 2017-03-16 ENCOUNTER — Encounter: Payer: Medicare Other | Admitting: Occupational Therapy

## 2017-03-18 ENCOUNTER — Encounter: Payer: Medicare Other | Admitting: Occupational Therapy

## 2017-03-24 ENCOUNTER — Ambulatory Visit
Payer: Medicare Other | Attending: Student in an Organized Health Care Education/Training Program | Admitting: Occupational Therapy

## 2017-03-24 DIAGNOSIS — I890 Lymphedema, not elsewhere classified: Secondary | ICD-10-CM | POA: Diagnosis not present

## 2017-03-24 NOTE — Patient Instructions (Signed)
Elevate legs when seated Continue impecable skin care 2 layer wrap to RLE until wound is healed . Suspend compression garment as donning /doffing may be irritatiing skin in area of fledgling ulcer Wear HOS devices nightly on both legs

## 2017-03-24 NOTE — Therapy (Signed)
[place] MAIN [facility] SERVICES [address] [place], [place], [address] Phone: [phone]   Fax:  [phone]  Occupational Therapy Treatment  Patient Details  Name: Alison Fox MRN: [id number] Date of Birth: 18-Oct-1971 No Data Recorded  Encounter Date: 03/24/2017      OT End of Session - 03/24/17 1405    Visit Number 19   Number of Visits 36   Date for OT Re-Evaluation 05/11/17   OT Start Time 1105   OT Stop Time 1211   OT Time Calculation (min) 66 min   Activity Tolerance Patient tolerated treatment well;No increased pain   Behavior During Therapy WFL for tasks assessed/performed      Past Medical History:  Diagnosis Date  . Glaucoma 08/22/2014  . Headache   . 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 On 01/21/15, she saw Dr. Kathyrn Sheriff at [facility].  She had a repeat MRI of the brain without contrast which demonstrated symmetric ventriculomegaly of the lateral ventricles and third ventricle without significant transependymal flow.  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  Endoscopic third ventriculostomy was offered but not insisted since her only symptoms are headache, which is now mild.  She did not wish to proceed with this procedure.  . Lymphedema    Chronic following left knee surgery in 2000.  . Varicose veins     Past Surgical History:  Procedure Laterality Date  . EYE SURGERY    . KNEE ARTHROSCOPY  2000   Left knee.     There were no vitals filed for this visit.      Subjective Assessment - 03/24/17 1400    Subjective  Pt presents for OT visit 38/72 (5fr 2018) for CDT to LLE. Pt is accompanied by her mother today. Pt reports she returned to DME vendor after last OT session and vendor completed measurements for remake on custom garment initially fitted in March. A  significant delay  in custom compression garment delivery and remake fitting by DME vendor has extended OT treatment plan well beyond initial estimate for POC.    Patient is accompained by: Family member   Pertinent History onset in 218sand positive family history for maternal grandmother and cousin suggests primary LE Tarda; arthroscopic knee sx ~ 2000?, recent fall w/ head laceration. 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   Limitations difficulty walking, decreased balance, suspected BLE suspected, R>L,     Special Tests get the swelling down and keep it down   Patient Stated Goals replace my stockings and get the swelling down again.   Currently in Pain? Yes   Pain Score --  not rated   Pain Location Leg   Pain Orientation Right   Pain Descriptors / Indicators Tender;Discomfort;Sore   Pain Type Acute pain   Pain Onset 1 to 4 weeks ago   Aggravating Factors  prolonged standing and walking   Pain Relieving Factors compression, elevation   Effect of Pain on Daily Activities limits ability to perform basic and instrumental ADLs, to work, to participate in leisure and social activities, and limits body image; causes patient perservation and worry about possibility of losing her leg  OT Treatments/Exercises (OP) - 03/24/17 0001      ADLs   ADL Education Given Yes     Manual Therapy   Manual Therapy Edema management;Manual Lymphatic Drainage (MLD);Compression Bandaging;Soft tissue mobilization;Other (comment)   Manual therapy comments RLE skin care   Manual Lymphatic Drainage (MLD) Manual lymph drainage (MLD) to RLE in supine utilizing functional inguinal lymph nodes and deep abdominal lymphatics as is customary for non-cancer related lower extremity LE, including bilateral  "short neck" sequence, J strokes to sub and supraclavicular LN, deep abdominal pathways, functional inguinal LN, lower extremity proximal to distal w/ emphasis on medial knee bottleneck and politeal LN. Performed fibrosis technique to B maleoli and distal  legs to address fatty fibrosis. Good tolerance.   Compression Bandaging RLE wrpas: 2 layer gradient wrap over Rosidal foam for light supportive  compression to knee.                  OT Short Term Goals - 09/01/16 1356      OT SHORT TERM GOAL #1   Title Lymphedema (LE) management/ self-care: Pt able to apply multi layered, gradient compression wraps with MAX caregiver assistance using proper techniques within 2 weeks to achieve optimal limb volume reduction.   Baseline max caregiver assist; caregiver min assist   Time 2   Period Weeks   Status New     OT SHORT TERM GOAL #2   Title Lymphedema (LE) management/ self-care:  Pt to achieve at least 10% LLE limb volume reductions bilaterally during Intensive CDT to limit LE progression, decrease infection and falls risk, to reduce pain/, and to improve safe ambulation and functional mobility.   Baseline max assist   Time 12   Period Weeks   Status New     OT SHORT TERM GOAL #3   Title Lymphedema (LE) management/ self-care:  Pt >/= 85 % compliant with all daily, LE self-care protocols for home program w/ needed level of caregiver assistance , including simple self-manual lymphatic drainage (MLD), skin care, lymphatic pumping the ex, skin care, and donning/ doffing compression wraps and garments o limit LE progression and further functional decline.     Baseline Max A   Time 12   Period Weeks   Status New     OT SHORT TERM GOAL #4   Title Lymphedema (LE) management/ self-care:  Pt to tolerate daily compression wraps, garments and devices in keeping w/ prescribed wear regime within 1 week of issue date to progress and retain clinical and functional gains and to limit LE progression.    Baseline mod A   Time 12   Period Weeks   Status New     OT SHORT TERM GOAL #5   Title Lymphedema (LE) self-care:  During Management Phase CDT Pt to sustain limb volume reductions achieved during Intensive Phase CDT within 5% utilizing LE self-care protocols, appropriate compression garments/ devices, and needed level of caregiver assistance.   Baseline Max A   Time 6   Period Months   Status New           OT Long Term Goals - 02/18/17 1356      OT LONG TERM GOAL #1   Title Pt able to correctly apply gradient compression wraps to below knee with moderate caregiver assistance for optimal limb volume reduction and improvement in tissue integrity. 11/30/16: PT CURRENTLY REQUIRES MAX a X1 TO DON COMPRESSION WRAPS.   Baseline dependent    Time 2   Period Weeks  Status Achieved     OT LONG TERM GOAL #2   Title Pt to achieve 15% limb volume reduction in LLE, and 10% reduction in RLE by DC to limit lymphedema (LE) progression and infection risk.- NEW GOAL 25%. 11/30/16: PT HAS MET 10% VOLUMETRIC REDUCTION GOAL BILATERALLY , AND, TO DATE, LLE "BELOW THE KNEE" (A-D) VOLUMETRIC REDUCTION ACHIEVED MEASURES 20.76%.  LLE "TOES TO GROIN" (A-G) LIMB VOLUME REDUCTION MEASURES 15.67% SINCE INITIALLY MEASURED FOR EPISODE 2 ON 08/27/16.   Baseline dependent   Time 12   Period Weeks   Status Achieved     OT LONG TERM GOAL #3   Title Pt IS CONSISTENTLY 85 % compliant with all daily LE self-care protocols w/  MAXIMUM caregiver assistance, including simple self-manual lymphatic drainage (MLD), skin care, lymphatic pumping therex, and donning/ doffing progression garments.. 11/30/16: GOAL MODIFIED TO REFLECT MORE REALISTI, SUSTAINABLE, DEGREE OF SUCCESSFUL LE SELF MANAGEMENT OVER TIME. DECREASED CONSISTENT COMPLIANCE FROM 100% TO 85%, AND PT REQUIRES MAX ASSIST FROM CAREGIVER INSTEAD OF MODERATE ASSISTANCE.    Baseline dependent   Time 12   Period Weeks   Status Achieved     OT LONG TERM GOAL #5   Title  Pt to remain infection free throughout CDT course to limit infection and LE progression.    Baseline dependent   Time 12   Period Weeks   Status Not Met     OT LONG TERM GOAL #6   Title During Management Phase CDT Pt to sustain limb volume reductions achieved during Intensive Phase CDT within 5% utilizing LE self-care protocols, appropriate compression garments/ devices, and moderate caregiver assistance.   Baseline dependent   Time 6   Status On-going             Patient will benefit from skilled therapeutic intervention in order to improve the following deficits and impairments:     Visit Diagnosis: Lymphedema, not elsewhere classified    Problem List Patient Active Problem List   Diagnosis Date Noted  . Intermittent explosive disorder 03/15/2017  . 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 Episodic tension-type headache, not intractable 07/22/2015  . Hydrocephalus, adult 09/10/2014  . Glaucoma 08/22/2014  . Preventative health care 05/18/2011  . Lymphedema 08/06/2006    Andrey Spearman, MS, OTR/L, CLT-LANA 03/24/17 2:10 PM  [place] MAIN [facility] SERVICES [address] [place], [place], [address] Phone: [phone]   Fax:  [phone]  Name: Alison Fox MRN: [id number] Date of Birth: [birth year]

## 2017-04-01 ENCOUNTER — Ambulatory Visit: Payer: Medicare Other | Admitting: Occupational Therapy

## 2017-04-01 DIAGNOSIS — I890 Lymphedema, not elsewhere classified: Secondary | ICD-10-CM

## 2017-04-01 NOTE — Therapy (Signed)
[place] MAIN [facility] SERVICES [address] [place], [place], [address] Phone: [phone]   Fax:  [phone]  Occupational Therapy Treatment  Patient Details  Name: Alison Fox MRN: [id number] Date of Birth: 09-28-1971 No Data Recorded  Encounter Date: 04/01/2017      OT End of Session - 04/01/17 1553    Visit Number 39   Number of Visits 54   Date for OT Re-Evaluation 05/11/17   Authorization Type 26 for 2018   OT Start Time 0850   OT Stop Time 0950   OT Time Calculation (min) 60 min   Activity Tolerance Patient tolerated treatment well;No increased pain   Behavior During Therapy WFL for tasks assessed/performed      Past Medical History:  Diagnosis Date  . Glaucoma 08/22/2014  . Headache   . 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 On 01/21/15, she saw Dr. Kathyrn Sheriff at [facility].  She had a repeat MRI of the brain without contrast which demonstrated symmetric ventriculomegaly of the lateral ventricles and third ventricle without significant transependymal flow.  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  Endoscopic third ventriculostomy was offered but not insisted since her only symptoms are headache, which is now mild.  She did not wish to proceed with this procedure.  . Lymphedema    Chronic following left knee surgery in 2000.  . Varicose veins     Past Surgical History:  Procedure Laterality Date  . EYE SURGERY    . KNEE ARTHROSCOPY  2000   Left knee.     There were no vitals filed for this visit.      Subjective Assessment - 04/01/17 1548    Subjective  Pt presents for OT visit 39/72 (26 for 2018) for CDT to LLE. Pt is accompanied by her mother today. Pt is wearing compression wraps bilaterally.   Patient is accompained by: Family member   Pertinent History onset in 51s  and positive family history for maternal grandmother and cousin suggests primary LE Tarda; arthroscopic knee sx ~ 2000?, recent fall w/ head laceration. 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   Limitations difficulty walking, decreased balance, suspected BLE suspected, R>L,     Special Tests get the swelling down and keep it down   Patient Stated Goals replace my stockings and get the swelling down again.   Currently in Pain? No/denies   Pain Onset 1 to 4 weeks ago                      OT Treatments/Exercises (OP) - 04/01/17 0001      ADLs   ADL Education Given Yes     Manual Therapy   Manual Therapy Edema management;Compression Bandaging   Manual therapy comments completed BLE  anatomical measurements for cuwstom , ccl 3 compression garments.  All documents faxed to DME vendor at end of day.   Compression Bandaging RLE wrpas: 2 layer gradient wrap over Rosidal foam for light supportive  compression to knee.                OT Education - 04/01/17 1551    Education provided Yes   Education Details Continued skilled  Pt/caregiver education  And LE ADL training throughout visit for lymphedema self care/ home program, including compression wrapping, compression garment and device wear/care, lymphatic pumping ther ex, simple self-MLD, and skin care. Discussed progress towards goals.   Person(s) Educated Patient;Parent(s)   Methods Explanation;Demonstration   Comprehension Verbalized understanding          OT Short Term Goals - 09/01/16 1356      OT SHORT TERM GOAL #1   Title Lymphedema (LE) management/ self-care: Pt able to apply multi layered, gradient compression wraps with MAX caregiver assistance using proper techniques within 2 weeks to achieve optimal limb volume reduction.    Baseline max caregiver assist; caregiver min assist   Time 2   Period Weeks   Status New     OT SHORT TERM GOAL #2   Title Lymphedema (LE) management/ self-care:  Pt to achieve at least 10% LLE limb volume reductions bilaterally during Intensive CDT to limit LE progression, decrease infection and falls risk, to reduce pain/, and to improve safe ambulation and functional mobility.   Baseline max assist   Time 12   Period Weeks   Status New     OT SHORT TERM GOAL #3   Title Lymphedema (LE) management/ self-care:  Pt >/= 85 % compliant with all daily, LE self-care protocols for home program w/ needed level of caregiver assistance , including simple self-manual lymphatic drainage (MLD), skin care, lymphatic pumping the ex, skin care, and donning/ doffing compression wraps and garments o limit LE progression and further functional decline.     Baseline Max A   Time 12   Period Weeks   Status New     OT SHORT TERM GOAL #4   Title Lymphedema (LE) management/ self-care:  Pt to tolerate daily compression wraps, garments and devices in keeping w/ prescribed wear regime within 1 week of issue date to progress and retain clinical and functional gains and to limit LE progression.   Baseline mod A   Time 12   Period Weeks   Status New     OT SHORT TERM GOAL #5   Title Lymphedema (LE) self-care:  During Management Phase CDT Pt to sustain limb volume reductions achieved during Intensive Phase CDT within 5% utilizing LE self-care protocols, appropriate compression garments/ devices, and needed level of caregiver assistance.   Baseline Max A   Time 6   Period Months   Status New           OT Long Term Goals - 02/18/17 1356      OT LONG TERM GOAL #1   Title Pt able to correctly apply gradient compression wraps to below knee with moderate caregiver assistance for optimal limb volume reduction and improvement in tissue integrity. 11/30/16: PT CURRENTLY REQUIRES MAX a X1 TO DON COMPRESSION WRAPS.    Baseline dependent    Time 2   Period Weeks   Status Achieved     OT LONG TERM GOAL #2   Title Pt to achieve 15% limb volume reduction in LLE, and 10% reduction in RLE by DC to limit lymphedema (LE) progression and infection risk.- NEW GOAL 25%. 11/30/16: PT HAS MET 10% VOLUMETRIC REDUCTION GOAL BILATERALLY , AND, TO DATE, LLE "BELOW THE KNEE" (A-D) VOLUMETRIC REDUCTION ACHIEVED MEASURES 20.76%.  LLE "TOES TO GROIN" (A-G) LIMB VOLUME REDUCTION MEASURES 15.67% SINCE INITIALLY MEASURED FOR EPISODE 2 ON 08/27/16.   Baseline dependent   Time 12   Period Weeks   Status Achieved  OT LONG TERM GOAL #3   Title Pt IS CONSISTENTLY 85 % compliant with all daily LE self-care protocols w/  MAXIMUM caregiver assistance, including simple self-manual lymphatic drainage (MLD), skin care, lymphatic pumping therex, and donning/ doffing progression garments.. 11/30/16: GOAL MODIFIED TO REFLECT MORE REALISTI, SUSTAINABLE, DEGREE OF SUCCESSFUL LE SELF MANAGEMENT OVER TIME. DECREASED CONSISTENT COMPLIANCE FROM 100% TO 85%, AND PT REQUIRES MAX ASSIST FROM CAREGIVER INSTEAD OF MODERATE ASSISTANCE.    Baseline dependent   Time 12   Period Weeks   Status Achieved     OT LONG TERM GOAL #5   Title Pt to remain infection free throughout CDT course to limit infection and LE progression.    Baseline dependent   Time 12   Period Weeks   Status Not Met     OT LONG TERM GOAL #6   Title During Management Phase CDT Pt to sustain limb volume reductions achieved during Intensive Phase CDT within 5% utilizing LE self-care protocols, appropriate compression garments/ devices, and moderate caregiver assistance.   Baseline dependent   Time 6   Status On-going               Plan - 04/01/17 1555    Clinical Impression Statement Completed anatomical measurements for BLE custom compression garments, Elvarex SOFT ccl 3 on R to knee, and Elvarex standard thigh high, ccl 3 on L.Reapplied BLE compression wraps . Pt  toleraed session without difficulty. Measurements faxed to DME vendor. IN effort to conserve appointments, Pt agrees to call after compression garments arrive to schedule fitting appointment.   Occupational performance deficits (Please refer to evaluation for details): ADL's;IADL's;Work;Social Participation;Leisure;Other  body image   Rehab Potential Good   Current Impairments/barriers affecting progress: see SUBJECTIVE section for list of functional impairments 2/2 BLE LE   OT Frequency Other (comment)  return for fitting appointment` when garments arrive. Pt and mother to continue diligent LE self-care at home during interval.   OT Duration 12 weeks   OT Treatment/Interventions Self-care/ADL training;Therapeutic exercise;Patient/family education;Manual Therapy;Manual lymph drainage;Therapeutic exercises;DME and/or AE instruction;Compression bandaging;Therapeutic activities;Scar mobilization;Other (comment)  skin care to limit infection risk and improve flexibility/ AROM   OT Home Exercise Plan 2 x daily lymphatic pumping ther ex- BLE- 2 sets of 10 reps. Daily simple self MLD to BLE   Consulted and Agree with Plan of Care Patient;Family member/caregiver      Patient will benefit from skilled therapeutic intervention in order to improve the following deficits and impairments:  Abnormal gait, Decreased skin integrity, Decreased knowledge of precautions, Decreased scar mobility, Impaired perceived functional ability, Improper body mechanics, Decreased activity tolerance, Decreased knowledge of use of DME, Impaired flexibility, Decreased balance, Difficulty walking, Obesity, Decreased range of motion, Increased edema, Pain, Improper spinal/pelvic alignment  Visit Diagnosis: Lymphedema, not elsewhere classified    Problem List Patient Active Problem List   Diagnosis Date Noted  . Intermittent explosive disorder 03/15/2017  . 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 Episodic tension-type headache, not intractable 07/22/2015  . Hydrocephalus, adult 09/10/2014  . Glaucoma 08/22/2014  . Preventative health care 05/18/2011  . Lymphedema 08/06/2006   Andrey Spearman, MS, OTR/L, [facility] 04/01/17 3:59 PM   [place] MAIN [facility] SERVICES [address] [place], [place], [address] Phone: [phone]   Fax:  [phone]  Name: Alison Fox MRN: [id number] Date of Birth: 07-Oct-1971

## 2017-04-07 ENCOUNTER — Ambulatory Visit (INDEPENDENT_AMBULATORY_CARE_PROVIDER_SITE_OTHER): Payer: Medicare Other | Admitting: Internal Medicine

## 2017-04-07 VITALS — BP 99/59 | HR 95 | Temp 98.0°F | Wt 163.2 lb

## 2017-04-07 DIAGNOSIS — I890 Lymphedema, not elsewhere classified: Secondary | ICD-10-CM | POA: Diagnosis not present

## 2017-04-07 NOTE — Assessment & Plan Note (Addendum)
Assessment Patient has chronic bilateral lower extremity edema secondary to lymphedema. She is currently being followed by the lymphedema clinic for physical therapy. She is also wearing compression stockings. Patient reports feeling well. Denies having any fevers, chills, nausea, or vomiting. The wound on her right lower extremity appears to be healing well. There is erythema but no signs of local infection or cellulitis. Please see image. No signs of systemic infection.  Plan -Continue physical therapy at lymphedema clinic -Continue to wear compression stockings -Cover the area of the wound with a dry dressing to keep the area clean, dry, and intact -Advised her to call the clinic if she develops fevers, chills, or if the wound looks worse.

## 2017-04-07 NOTE — Patient Instructions (Addendum)
[name] it was a pleasure meeting you today.  -Continue your daily dressing changes. Keep the area dry.   -Continue wearing compression stockings    -Continue going to lymphedema clinic for therapy    -Call our clinic if you experience any fevers/ chills or do not feel well.

## 2017-04-07 NOTE — Progress Notes (Signed)
   CC: Patient is here for a follow-up of her right lower extremity wound.  HPI:  [name] is a 46 y.o. female with a past medical history of conditions listed below presenting to the clinic for follow up of her right lower extremity wound. Please see problem based charting for the status of the patient's current and chronic medical conditions.   Past Medical History:  Diagnosis Date  . Glaucoma 08/22/2014  . Headache   . 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 On 01/21/15, she saw [facility] Conchita [facility]undkumar [facility].  She had a repeat MRI of the brain without contrast which demonstrated symmetric ventriculomegaly of the lateral ventricles and third ventricle without significant transependymal flow.  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  Endoscopic third ventriculostomy was offered but not insisted since her only symptoms are headache, which is now mild.  She did not wish to proceed with this procedure.  . Lymphedema    Chronic following left knee surgery in 2000.  . Varicose veins     Review of Systems: Pertinent positives mentioned in HPI. Remainder of all ROS negative.   Physical Exam:  Vitals:   04/07/17 1022  BP: (!) 99/59  Pulse: 95  Temp: 98 F (36.7 C)  TempSrc: Oral  SpO2: 99%  Weight: 163 lb 3.2 oz (74 kg)   Physical Exam  Constitutional: She is oriented to person, place, and time. She appears well-developed and well-nourished. No distress.  HENT:  Head: Normocephalic and atraumatic.  Eyes: Right eye exhibits no discharge. Left eye exhibits no discharge.  Cardiovascular: Normal rate, regular rhythm and intact distal pulses.   Pulmonary/Chest: Effort normal and breath sounds normal. No respiratory distress. She has no wheezes. She has no rales.  Abdominal: Soft. Bowel sounds are normal. She exhibits no distension. There is  no tenderness.  Musculoskeletal: She exhibits edema.  Entire left lower extremity is wrapped with Ace bandage and appears significantly edematous. Wound noticed on the medial aspect of her right lower extremity at about mid shin level. Well-healing eschar. Scant dried pus noted on the dressing. No active pus or sanguinous drainage noted from the wound. The area appears erythematous but is nontender to palpation. No evidence of cellulitis. Please see image below.  Neurological: She is alert and oriented to person, place, and time.  Skin: Skin is warm and dry.      Assessment & Plan:   See Encounters Tab for problem based charting.  Patient seen with Dr. Oswaldo DoneVincent

## 2017-04-08 DIAGNOSIS — I890 Lymphedema, not elsewhere classified: Secondary | ICD-10-CM | POA: Diagnosis not present

## 2017-04-08 NOTE — Progress Notes (Signed)
Internal Medicine Clinic Attending  I saw and evaluated the patient.  I personally confirmed the key portions of the history and exam documented by Dr. Loney Lohathore and I reviewed pertinent patient test results.  The assessment, diagnosis, and plan were formulated together and I agree with the documentation in the resident's note.  Diminutive wound looks good with no signs of infection. I advised against neosporin creams, please keep it clean and dry. Stasis changes on the right are progressing, more hemosiderin deposition and thinning of the skin. We will continue with compression therapy.

## 2017-04-20 ENCOUNTER — Other Ambulatory Visit: Payer: Self-pay | Admitting: Student in an Organized Health Care Education/Training Program

## 2017-04-20 ENCOUNTER — Other Ambulatory Visit: Payer: Self-pay

## 2017-04-20 DIAGNOSIS — N63 Unspecified lump in unspecified breast: Secondary | ICD-10-CM

## 2017-04-29 ENCOUNTER — Ambulatory Visit
Admission: RE | Admit: 2017-04-29 | Discharge: 2017-04-29 | Disposition: A | Discharge status: Home / Self Care | Payer: Medicare Other | Source: Ambulatory Visit | Attending: Student in an Organized Health Care Education/Training Program | Admitting: Student in an Organized Health Care Education/Training Program

## 2017-04-29 ENCOUNTER — Other Ambulatory Visit: Payer: Self-pay | Admitting: Student in an Organized Health Care Education/Training Program

## 2017-04-29 ENCOUNTER — Ambulatory Visit
Admission: RE | Admit: 2017-04-29 | Discharge: 2017-04-29 | Disposition: A | Discharge status: Home / Self Care | Payer: Medicaid Other | Source: Ambulatory Visit | Attending: Student in an Organized Health Care Education/Training Program | Admitting: Student in an Organized Health Care Education/Training Program

## 2017-04-29 DIAGNOSIS — N63 Unspecified lump in unspecified breast: Secondary | ICD-10-CM

## 2017-04-29 DIAGNOSIS — R928 Other abnormal and inconclusive findings on diagnostic imaging of breast: Secondary | ICD-10-CM | POA: Diagnosis not present

## 2017-04-29 DIAGNOSIS — N6489 Other specified disorders of breast: Secondary | ICD-10-CM | POA: Diagnosis not present

## 2017-05-06 ENCOUNTER — Encounter: Payer: Self-pay | Admitting: Internal Medicine

## 2017-05-06 ENCOUNTER — Ambulatory Visit (INDEPENDENT_AMBULATORY_CARE_PROVIDER_SITE_OTHER): Payer: Medicare Other | Admitting: Internal Medicine

## 2017-05-06 VITALS — BP 116/62 | HR 94 | Temp 98.1°F | Ht 68.5 in | Wt 168.1 lb

## 2017-05-06 DIAGNOSIS — G243 Spasmodic torticollis: Secondary | ICD-10-CM | POA: Diagnosis not present

## 2017-05-06 MED ORDER — CYCLOBENZAPRINE HCL 5 MG PO TABS: 5.0000 mg | ORAL_TABLET | Freq: Every day | ORAL | 0 refills | Status: DC

## 2017-05-06 NOTE — Patient Instructions (Signed)
Thank you for coming in today.  We are giving you some medicine called Flexeril to help relax her muscles. Take 1 pill at bedtime because it can make you drowsy.  We are going to order an MRI of your neck to get a better look at what is going on. Also, try to make an appointment with your neurologist soon, to let him know what's going on and for more evaluation

## 2017-05-06 NOTE — Progress Notes (Signed)
   CC: Neck pain  HPI:  Ms.Ai Armandina GemmaMcCraw is a 46 y.o. F with past medical history as described below who presents to the clinic for neck pain.   About 1-1.5 wks ago her neck became stiff and feels stuck toward her R side. The L side of her neck and L upper arm have become sore over that time. She denies numbness/tingling or weakness in upper extremities, no changes in her chronic headaches, no LOC. She notes two episodes of dizziness and decreased balance, no identifiable pattern, no relation with standing. She denies fever, palpitations, recent illness, fever. She is currently being followed by neurology for chronic headaches, she also a history of obstructive hydrocephalus being monitored.    Past Medical History:  Diagnosis Date  . Glaucoma 08/22/2014  . Headache   . 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 On 01/21/15, she saw [facility].  She had a repeat MRI of the brain without contrast which demonstrated symmetric ventriculomegaly of the lateral ventricles and third ventricle without significant transependymal flow.  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  Endoscopic third ventriculostomy was offered but not insisted since her only symptoms are headache, which is now mild.  She did not wish to proceed with this procedure.  . Lymphedema    Chronic following left knee surgery in 2000.  . Varicose veins    Review of Systems:  Review of Systems  Constitutional: Negative for fever.  Cardiovascular: Negative for chest pain and palpitations.  Musculoskeletal: Positive for neck pain.  Neurological: Positive for headaches. Negative for tingling, focal weakness and loss of consciousness.     Physical Exam:  Vitals:   05/06/17 1325  BP: 116/62  Pulse: 94  Temp: 98.1 F (36.7 C)  TempSrc: Oral  SpO2: 98%  Weight: 168 lb 1.6  oz (76.2 kg)  Height: 5' 8.5" (1.74 m)   Physical Exam  Constitutional: She is oriented to person, place, and time.  Cardiovascular: Normal rate and regular rhythm.   Pulmonary/Chest: Effort normal and breath sounds normal.  Musculoskeletal:  No midline tenderness to C spine, tenderness and tightness of L trapezius and SCM, no tenderness of R neck muscles and no palpable muscle spasm. Decreased ROM of neck with resting position turned and tilted to the right, difficulty moving past midline. 4/5 strength to L UE proximal muscles, otherwise full strength    Lymphadenopathy:    She has no cervical adenopathy.  Neurological: She is alert and oriented to person, place, and time. She displays normal reflexes. No cranial nerve deficit.    Assessment & Plan:   See Encounters Tab for problem based charting.  Patient seen with Dr. Cyndie ChimeGranfortuna

## 2017-05-06 NOTE — Assessment & Plan Note (Addendum)
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 She certainly has muscle tightness in her neck with discomfort. We will try a trial of cyclobenzaprine to help relieve her sx. She is established with a neurologist who may be able to further evaluate and offer insight into the etiology or different treatment options such as botulinum injections or levodopa. Given her history of hydrocephalus and this new neurologic problem, we feel imaging is appropriate for better characterization. 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

## 2017-05-07 NOTE — Progress Notes (Signed)
Medicine attending: I personally interviewed and examined this patient on the day of the patient visit and reviewed pertinent clinical ,laboratory, and radiographic data  with resident physician Dr. Ginger CarneKyler Harden and we discussed a management plan. Very unusual large area of tense, firm, left cervical muscle tightness. I am concerned re possible underlying neoplastic process. Decreased strength cranial nerve XI (left shoulder elevation). No other focal neuro deficits. We will start trial of muscle relaxant pending imaging studies.

## 2017-05-31 ENCOUNTER — Encounter: Payer: Self-pay | Admitting: Student in an Organized Health Care Education/Training Program

## 2017-05-31 ENCOUNTER — Ambulatory Visit (INDEPENDENT_AMBULATORY_CARE_PROVIDER_SITE_OTHER): Payer: Medicare Other | Admitting: Student in an Organized Health Care Education/Training Program

## 2017-05-31 VITALS — BP 120/67 | HR 94 | Temp 98.2°F | Ht 68.5 in | Wt 165.9 lb

## 2017-05-31 DIAGNOSIS — G243 Spasmodic torticollis: Secondary | ICD-10-CM

## 2017-05-31 DIAGNOSIS — Z79899 Other long term (current) drug therapy: Secondary | ICD-10-CM | POA: Diagnosis not present

## 2017-05-31 DIAGNOSIS — G44219 Episodic tension-type headache, not intractable: Secondary | ICD-10-CM

## 2017-05-31 DIAGNOSIS — I890 Lymphedema, not elsewhere classified: Secondary | ICD-10-CM | POA: Diagnosis not present

## 2017-05-31 MED ORDER — CYCLOBENZAPRINE HCL 5 MG PO TABS: 5.0000 mg | ORAL_TABLET | Freq: Every day | ORAL | 1 refills | Status: DC

## 2017-05-31 NOTE — Assessment & Plan Note (Signed)
Symptomatically stable, headaches are well controlled but not resolved. Plan is to continue with lamotrigine 25 mg daily. She'll follow-up with neurology in December.

## 2017-05-31 NOTE — Patient Instructions (Signed)
1. Because your neck pain is now much better, I will cancel the MRI. I called in another supply of Flexeril for you to use as needed in case the neck pain comes back. This is a muscle relaxer, you do not need to take it every day.  2. Keep using the lymphedema pump as you were doing. If you have any new wounds on the leg me know.  3. Keep taking year headache medication as you are doing, and follow-up with the neurologist in December. Call me if the headache worsens.

## 2017-05-31 NOTE — Progress Notes (Signed)
   Assessment and Plan:  See Encounters tab for problem-based medical decision making.   __________________________________________________________  HPI:   46 year old woman here for follow-up of neck pain. She is recent is seen at the acute care clinic because of sudden onset neck pain radiating into her head. That time she also had dystonia with her head contracted to the right involuntarily. She is treated with Flexeril and did very well. She reported that the pain went away after a few days. She reports not having any more significant neck pain. Her headaches are well controlled with lamictal. Denies any fevers or chills. Eating and drinking well. No falls. Lives with her mother and they're doing well together currently. She follows up closely with the lymphedema clinic. She recently obtained a lymphedema pump that she uses daily at home. She says that the pump has been very helpful for her left-sided lymphedema. Also doing wraps to the right side which has mild lymphedema and stasis changes of the skin.  __________________________________________________________  Problem List: Patient Active Problem List   Diagnosis Date Noted  . Hydrocephalus, adult 09/10/2014    Priority: High  . Lymphedema 08/06/2006    Priority: High  . Episodic tension-type headache, not intractable 07/22/2015    Priority: Medium  . 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 Glaucoma 08/22/2014    Priority: Low  . Preventative health care 05/18/2011    Priority: Low    Medications: Reconciled today in Epic __________________________________________________________  Physical Exam:  Vital Signs: Vitals:   05/31/17 1128  BP: 120/67  Pulse: 94  Temp: 98.2 F (36.8 C)  TempSrc: Oral  SpO2: 100%  Weight: 165 lb 14.4 oz (75.3 kg)  Height: 5' 8.5" (1.74 m)    Gen: Well appearing, NAD ENT: OP clear without erythema or exudate.  Neck: No cervical LAD, No thyromegaly or nodules, No JVD. Mild  tenderness on the left para spinal muscles of the neck. CV: RRR, no murmurs Pulm: Normal effort, CTA throughout, no wheezing Ext: Warm, right-sided chronic stasis changes, small healing ulcers without signs of infection. Left side severe stable nonpitting lymphedema is wrapped. Skin: No atypical appearing moles. No rashes, numerous freckles and sun tanning on her neck and arms.

## 2017-05-31 NOTE — Assessment & Plan Note (Signed)
Percent of sudden onset neck pain, probably cervical dystonia, was much improved today after a few days of Flexeril. Pain is now essentially resolved. Acute care physicians had ordered an MRI of her cervical spine, this is scheduled for later this week however I don't think it is necessary now that her symptoms have totally resolved. We'll get a cancel the MRI. Plan is to continue with Flexeril 5 mg only as needed. I gave her another refill of this medication today.

## 2017-05-31 NOTE — Assessment & Plan Note (Signed)
Symptomatically stable and improved from the past. She's doing well with the lymphedema pump which she uses at home once a day. Plan is to continue follow-up with the lymphedema clinic. She does pressure wrappings to the left and right legs.

## 2017-06-02 ENCOUNTER — Ambulatory Visit (HOSPITAL_COMMUNITY): Payer: Medicare Other

## 2017-06-08 ENCOUNTER — Ambulatory Visit
Payer: Medicare Other | Attending: Student in an Organized Health Care Education/Training Program | Admitting: Occupational Therapy

## 2017-06-08 DIAGNOSIS — I890 Lymphedema, not elsewhere classified: Secondary | ICD-10-CM | POA: Insufficient documentation

## 2017-06-08 NOTE — Therapy (Signed)
[place] MAIN [facility] SERVICES [address] [place], [place], [address] Phone: [phone]   Fax:  [phone]  Occupational Therapy Treatment Note and Progress Report  Patient Details  Name: Alison Fox MRN: [id number] Date of Birth: 19-Jan-1971 No Data Recorded  Encounter Date: 06/08/2017      OT End of Session - 06/08/17 1653    Visit Number 40   Number of Visits 72   Date for OT Re-Evaluation 05/11/17   Authorization Type 27   OT Start Time 0910   OT Stop Time 0930   OT Time Calculation (min) 20 min   Activity Tolerance Patient tolerated treatment well;No increased pain   Behavior During Therapy WFL for tasks assessed/performed      Past Medical History:  Diagnosis Date  . 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 Glaucoma 08/22/2014  . Headache   . 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 On 01/21/15, she saw Dr. Kathyrn Sheriff at [facility].  She had a repeat MRI of the brain without contrast which demonstrated symmetric ventriculomegaly of the lateral ventricles and third ventricle without significant transependymal flow.  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  Endoscopic third ventriculostomy was offered but not insisted since her only symptoms are headache, which is now mild.  She did not wish to proceed with this procedure.  . Lymphedema    Chronic following left knee surgery in 2000.  . Varicose veins     Past Surgical History:  Procedure Laterality Date  . EYE SURGERY    . KNEE ARTHROSCOPY  2000   Left knee.     There were no vitals filed for this visit.      Subjective Assessment - 06/08/17 1650    Subjective  Pt presents for OT visit 40/72 (27 for 2018) for CDT to LLE. Pt is accompanied by her mother today. Pt is wearing  new  compression garment on LLE. RLE garment is not available for assessment, but Pt and mother report garmenrt fits fine and 2 for each leg were received.c   Patient is accompained by: Family member   Pertinent History onset in 51s and positive family history for maternal grandmother and cousin suggests primary LE Tarda; arthroscopic knee sx ~ 2000?, recent fall w/ head laceration. 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   Limitations difficulty walking, decreased balance, suspected BLE suspected, R>L,     Special Tests get the swelling down and keep it down   Patient Stated Goals replace my stockings and get the swelling down again.   Currently in Pain? Yes   Pain Score --  not rated numerically. Unrelated to LE. denies leg pain.   Pain Location Neck   Pain Onset Yesterday   Pain Frequency Constant                              OT Education - 06/08/17 1652    Education provided Yes   Education Details skilled LE ADL training for custom compression wear and care, donning and doffing.  Good return   Person(s) Educated Patient   Methods Explanation;Demonstration   Comprehension  Verbalized understanding;Returned demonstration          OT Short Term Goals - 09/01/16 1356      OT SHORT TERM GOAL #1   Title Lymphedema (LE) management/ self-care: Pt able to apply multi layered, gradient compression wraps with MAX caregiver assistance using proper techniques within 2 weeks to achieve optimal limb volume reduction.   Baseline max caregiver assist; caregiver min assist   Time 2   Period Weeks   Status New     OT SHORT TERM GOAL #2   Title Lymphedema (LE) management/ self-care:  Pt to achieve at least 10% LLE limb volume reductions bilaterally during Intensive CDT to limit LE progression,  decrease infection and falls risk, to reduce pain/, and to improve safe ambulation and functional mobility.   Baseline max assist   Time 12   Period Weeks   Status New     OT SHORT TERM GOAL #3   Title Lymphedema (LE) management/ self-care:  Pt >/= 85 % compliant with all daily, LE self-care protocols for home program w/ needed level of caregiver assistance , including simple self-manual lymphatic drainage (MLD), skin care, lymphatic pumping the ex, skin care, and donning/ doffing compression wraps and garments o limit LE progression and further functional decline.     Baseline Max A   Time 12   Period Weeks   Status New     OT SHORT TERM GOAL #4   Title Lymphedema (LE) management/ self-care:  Pt to tolerate daily compression wraps, garments and devices in keeping w/ prescribed wear regime within 1 week of issue date to progress and retain clinical and functional gains and to limit LE progression.   Baseline mod A   Time 12   Period Weeks   Status New     OT SHORT TERM GOAL #5   Title Lymphedema (LE) self-care:  During Management Phase CDT Pt to sustain limb volume reductions achieved during Intensive Phase CDT within 5% utilizing LE self-care protocols, appropriate compression garments/ devices, and needed level of caregiver assistance.   Baseline Max A   Time 6   Period Months   Status New           OT Long Term Goals - 06/08/17 1659      OT LONG TERM GOAL #1   Title Pt able to correctly apply gradient compression wraps to below knee with moderate caregiver assistance for optimal limb volume reduction and improvement in tissue integrity. 11/30/16: PT CURRENTLY REQUIRES MAX a X1 TO DON COMPRESSION WRAPS.   Baseline dependent    Time 2   Period Weeks   Status Achieved     OT LONG TERM GOAL #2   Title Pt to achieve 15% limb volume reduction in LLE, and 10% reduction in RLE by DC to limit lymphedema (LE) progression and infection risk.- NEW GOAL 25%. 11/30/16: PT HAS MET 10%  VOLUMETRIC REDUCTION GOAL BILATERALLY , AND, TO DATE, LLE "BELOW THE KNEE" (A-D) VOLUMETRIC REDUCTION ACHIEVED MEASURES 20.76%.  LLE "TOES TO GROIN" (A-G) LIMB VOLUME REDUCTION MEASURES 15.67% SINCE INITIALLY MEASURED FOR EPISODE 2 ON 08/27/16.   Baseline dependent   Time 12   Period Weeks   Status Achieved     OT LONG TERM GOAL #3   Title Pt IS CONSISTENTLY 85 % compliant with all daily LE self-care protocols w/  MAXIMUM caregiver assistance, including simple self-manual lymphatic drainage (MLD), skin care, lymphatic pumping therex, and donning/ doffing progression garments.. 11/30/16: GOAL MODIFIED TO REFLECT  MORE REALISTI, SUSTAINABLE, DEGREE OF SUCCESSFUL LE SELF MANAGEMENT OVER TIME. DECREASED CONSISTENT COMPLIANCE FROM 100% TO 85%, AND PT REQUIRES MAX ASSIST FROM CAREGIVER INSTEAD OF MODERATE ASSISTANCE.    Baseline dependent   Time 12   Period Weeks   Status Achieved     OT LONG TERM GOAL #5   Title Pt to remain infection free throughout CDT course to limit infection and LE progression.    Baseline dependent   Time 12   Period Weeks   Status Achieved     OT LONG TERM GOAL #6   Title During Management Phase CDT Pt to sustain limb volume reductions achieved during Intensive Phase CDT within 5% utilizing LE self-care protocols, appropriate compression garments/ devices, and moderate caregiver assistance.   Baseline dependent   Time 6   Period Months   Status Partially Met               Plan - 06/12/17 1653    Clinical Impression Statement LLE custom thigh high compression garemtn fits well and is comfortable according to Pt and her mother. RLE custom knee high is not avaialble for assessment  as Pt is wearing older R knee high to clinic. Pt and her mother agree that RLE knee high fiits well, is comfortable , and Pt is able to don and doff it independently with extra time (modified independence). Pt agrees to wear compression garments daily as directed. She agrees to continue  all LE self care routines for skin care, simple self MLD and ther ex. Pt agrees w/ plan to return for F/U in 3 months to repeat custom compression garment measurements PRN and to assess condition and oongoing self care for long term management phase CDT.   Occupational performance deficits (Please refer to evaluation for details): ADL's;IADL's;Education;Work;Rest and Sleep;Leisure;Social Participation   OT Frequency Other (comment)  3 month F/U and PRN   OT Treatment/Interventions Self-care/ADL training;Therapeutic exercise;Patient/family education;Manual Therapy;Manual lymph drainage;Therapeutic exercises;DME and/or AE instruction;Compression bandaging;Therapeutic activities   Clinical Decision Making Several treatment options, min-mod task modification necessary   Consulted and Agree with Plan of Care Patient;Family member/caregiver      Patient will benefit from skilled therapeutic intervention in order to improve the following deficits and impairments:  Abnormal gait, Decreased endurance, Decreased skin integrity, Decreased knowledge of precautions, Decreased scar mobility, Decreased knowledge of use of DME, Impaired flexibility, Decreased balance, Decreased mobility, Difficulty walking, Decreased coordination  Visit Diagnosis: Lymphedema, not elsewhere classified      G-Codes - 2017-06-12 1700    Functional Assessment Tool Used (Outpatient only) medical hx review, onservation, interview, physical examination, comparative limb volumetrics   Functional Limitation Self care   Self Care Current Status (O1224) At least 40 percent but less than 60 percent impaired, limited or restricted   Self Care Goal Status (M2500) At least 20 percent but less than 40 percent impaired, limited or restricted      Problem List Patient Active Problem List   Diagnosis Date Noted  . Cervical dystonia 05/06/2017  . Episodic tension-type headache, not intractable 07/22/2015  . Hydrocephalus, adult 09/10/2014   . Glaucoma 08/22/2014  . Preventative health care 05/18/2011  . Lymphedema 08/06/2006    Andrey Spearman, MS, OTR/L, [facility] 2017-06-12 5:02 PM  [place] MAIN [facility] SERVICES [address] [place], [place], [address] Phone: [phone]   Fax:  [phone]  Name: Arria Naim MRN: [id number] Date of Birth: 1971/07/22

## 2017-07-07 ENCOUNTER — Other Ambulatory Visit: Payer: Self-pay | Admitting: Neurology

## 2017-07-14 ENCOUNTER — Telehealth: Payer: Self-pay

## 2017-07-14 NOTE — Telephone Encounter (Signed)
[facility] Main rehab services form faxed 07/14/2017.

## 2017-08-24 ENCOUNTER — Ambulatory Visit
Payer: Medicare Other | Attending: Student in an Organized Health Care Education/Training Program | Admitting: Occupational Therapy

## 2017-08-24 DIAGNOSIS — I890 Lymphedema, not elsewhere classified: Secondary | ICD-10-CM

## 2017-08-24 NOTE — Therapy (Signed)
[place] MAIN [facility] SERVICES [address] [place], [place], [address] Phone: [phone]   Fax:  [phone]  Occupational Therapy Treatment  Patient Details  Name: Alison Fox MRN: [id number] Date of Birth: 1971/04/13 No Data Recorded  Encounter Date: 08/24/2017  OT End of Session - 08/24/17 1613    Visit Number  41    Number of Visits  2    Date for OT Re-Evaluation  05/11/17    Authorization Type  28 for 2018    OT Start Time  1125    OT Stop Time  1145    OT Time Calculation (min)  20 min    Activity Tolerance  Patient tolerated treatment well;No increased pain    Behavior During Therapy  WFL for tasks assessed/performed       Past Medical History:  Diagnosis Date  . 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 Glaucoma 08/22/2014  . Headache   . 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 On 01/21/15, she saw Dr. Kathyrn Sheriff at [facility].  She had a repeat MRI of the brain without contrast which demonstrated symmetric ventriculomegaly of the lateral ventricles and third ventricle without significant transependymal flow.  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  Endoscopic third ventriculostomy was offered but not insisted since her only symptoms are headache, which is now mild.  She did not wish to proceed with this procedure.  . Lymphedema    Chronic following left knee surgery in 2000.  . Varicose veins     Past Surgical History:  Procedure Laterality Date  . EYE SURGERY    . KNEE ARTHROSCOPY  2000   Left knee.     There were no vitals filed for this visit.  Subjective Assessment - 08/24/17 1611    Subjective   Pt presents for OT visit 41/72 (28 for 2018) for CDT to LLE. Pt is accompanied by her mother today. Pt presents for follow up apt one  week early, but we were able to work her in today. Pt is wearing compression garments as directed.     Patient is accompained by:  Family member    Pertinent History  onset in 53s and positive family history for maternal grandmother and cousin suggests primary LE Tarda; arthroscopic knee sx ~ 2000?, recent fall w/ head laceration. 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    Limitations  difficulty walking, decreased balance, suspected BLE suspected, R>L,      Special Tests  get the swelling down and keep it down    Patient Stated Goals  replace my stockings and get the swelling down again.    Currently in Pain?  No/denies    Pain Onset  Yesterday                   OT Treatments/Exercises (OP) - 08/24/17 0001      ADLs   ADL Education Given  Yes      Manual Therapy   Manual Therapy  Edema management             OT Education - 08/24/17 1613    Education provided  Yes    Education  Details  Cont w/ Pt LE self care training throughout session    Person(s) Educated  Patient;Parent(s)    Methods  Explanation;Demonstration    Comprehension  Verbalized understanding;Returned demonstration       OT Short Term Goals - 09/01/16 1356      OT SHORT TERM GOAL #1   Title  Lymphedema (LE) management/ self-care: Pt able to apply multi layered, gradient compression wraps with MAX caregiver assistance using proper techniques within 2 weeks to achieve optimal limb volume reduction.    Baseline  max caregiver assist; caregiver min assist    Time  2    Period  Weeks    Status  New      OT SHORT TERM GOAL #2   Title  Lymphedema (LE) management/ self-care:  Pt to achieve at least 10% LLE limb volume reductions bilaterally during Intensive CDT to limit LE progression, decrease infection and falls risk, to  reduce pain/, and to improve safe ambulation and functional mobility.    Baseline  max assist    Time  12    Period  Weeks    Status  New      OT SHORT TERM GOAL #3   Title  Lymphedema (LE) management/ self-care:  Pt >/= 85 % compliant with all daily, LE self-care protocols for home program w/ needed level of caregiver assistance , including simple self-manual lymphatic drainage (MLD), skin care, lymphatic pumping the ex, skin care, and donning/ doffing compression wraps and garments o limit LE progression and further functional decline.      Baseline  Max A    Time  12    Period  Weeks    Status  New      OT SHORT TERM GOAL #4   Title  Lymphedema (LE) management/ self-care:  Pt to tolerate daily compression wraps, garments and devices in keeping w/ prescribed wear regime within 1 week of issue date to progress and retain clinical and functional gains and to limit LE progression.    Baseline  mod A    Time  12    Period  Weeks    Status  New      OT SHORT TERM GOAL #5   Title  Lymphedema (LE) self-care:  During Management Phase CDT Pt to sustain limb volume reductions achieved during Intensive Phase CDT within 5% utilizing LE self-care protocols, appropriate compression garments/ devices, and needed level of caregiver assistance.    Baseline  Max A    Time  6    Period  Months    Status  New        OT Long Term Goals - 06/08/17 1659      OT LONG TERM GOAL #1   Title  Pt able to correctly apply gradient compression wraps to below knee with moderate caregiver assistance for optimal limb volume reduction and improvement in tissue integrity. 11/30/16: PT CURRENTLY REQUIRES MAX a X1 TO DON COMPRESSION WRAPS.    Baseline  dependent     Time  2    Period  Weeks    Status  Achieved      OT LONG TERM GOAL #2   Title  Pt to achieve 15% limb volume reduction in LLE, and 10% reduction in RLE by DC to limit lymphedema (LE) progression and infection risk.- NEW GOAL 25%. 11/30/16: PT HAS MET  10% VOLUMETRIC REDUCTION GOAL BILATERALLY , AND, TO DATE, LLE "BELOW THE KNEE" (A-D) VOLUMETRIC REDUCTION ACHIEVED MEASURES 20.76%.  LLE "  TOES TO GROIN" (A-G) LIMB VOLUME REDUCTION MEASURES 15.67% SINCE INITIALLY MEASURED FOR EPISODE 2 ON 08/27/16.    Baseline  dependent    Time  12    Period  Weeks    Status  Achieved      OT LONG TERM GOAL #3   Title  Pt IS CONSISTENTLY 85 % compliant with all daily LE self-care protocols w/  MAXIMUM caregiver assistance, including simple self-manual lymphatic drainage (MLD), skin care, lymphatic pumping therex, and donning/ doffing progression garments.. 11/30/16: GOAL MODIFIED TO REFLECT MORE REALISTI, SUSTAINABLE, DEGREE OF SUCCESSFUL LE SELF MANAGEMENT OVER TIME. DECREASED CONSISTENT COMPLIANCE FROM 100% TO 85%, AND PT REQUIRES MAX ASSIST FROM CAREGIVER INSTEAD OF MODERATE ASSISTANCE.     Baseline  dependent    Time  12    Period  Weeks    Status  Achieved      OT LONG TERM GOAL #5   Title  Pt to remain infection free throughout CDT course to limit infection and LE progression.     Baseline  dependent    Time  12    Period  Weeks    Status  Achieved      OT LONG TERM GOAL #6   Title  During Management Phase CDT Pt to sustain limb volume reductions achieved during Intensive Phase CDT within 5% utilizing LE self-care protocols, appropriate compression garments/ devices, and moderate caregiver assistance.    Baseline  dependent    Time  6    Period  Months    Status  Partially Met            Plan - 08/24/17 1614    Clinical Impression Statement  Pt returns for 3 month F/U today. She and her mother report she is performing MLD consistently . Mother reports Pt needs to work on skin care as she must be cued daily to perform  this LE self care protocol. Pt twells me she likes her compression pump and uses it daily for an hour as directed. Pt tells me she is utilizing HOS devices more conmsistently and mother confirms. Compression garments   fit very  well, are comfortable, and swelling is well contained. Pt is able to don and doff garments independently and she wears them daily as directed. Pt agrees with plan to return in 3 months to complete  measurements for  replacement garments. Pt will call PRN with questions or concerns.    Occupational performance deficits (Please refer to evaluation for details):  ADL's;IADL's;Rest and Sleep;Work;Leisure;Social Participation;Other body image   body image   Rehab Potential  Good    OT Frequency  Other (comment) 3 month F/U and PRN   3 month F/U and PRN   OT Treatment/Interventions  Self-care/ADL training;Therapeutic exercise;Patient/family education;Manual Therapy;Manual lymph drainage;Therapeutic exercises;DME and/or AE instruction;Compression bandaging;Therapeutic activities    Consulted and Agree with Plan of Care  Patient;Family member/caregiver       Patient will benefit from skilled therapeutic intervention in order to improve the following deficits and impairments:  Abnormal gait, Decreased endurance, Decreased skin integrity, Decreased knowledge of precautions, Decreased scar mobility, Decreased knowledge of use of DME, Impaired flexibility, Decreased balance, Decreased mobility, Difficulty walking, Decreased coordination  Visit Diagnosis: Lymphedema, not elsewhere classified    Problem List Patient Active Problem List   Diagnosis Date Noted  . Cervical dystonia 05/06/2017  . Episodic tension-type headache, not intractable 07/22/2015  . Hydrocephalus, adult 09/10/2014  . Glaucoma 08/22/2014  . Preventative health care 05/18/2011  .  Lymphedema 08/06/2006    Andrey Spearman, MS, OTR/L, [facility] 08/24/17 4:19 PM  [place] MAIN [facility] SERVICES [address] [place], [place], [address] Phone: [phone]   Fax:  [phone]  Name: Alison Fox MRN: [id number] Date of Birth: [birth year]

## 2017-09-01 ENCOUNTER — Ambulatory Visit: Payer: Medicare Other | Admitting: Occupational Therapy

## 2017-09-20 ENCOUNTER — Encounter: Payer: Self-pay | Admitting: Neurology

## 2017-09-20 ENCOUNTER — Ambulatory Visit (INDEPENDENT_AMBULATORY_CARE_PROVIDER_SITE_OTHER): Payer: Medicare Other | Admitting: Neurology

## 2017-09-20 VITALS — BP 110/86 | HR 109 | Ht 66.75 in | Wt 171.2 lb

## 2017-09-20 DIAGNOSIS — G44219 Episodic tension-type headache, not intractable: Secondary | ICD-10-CM

## 2017-09-20 NOTE — Patient Instructions (Signed)
1.  Continue nortriptyline 100mg  at bedtime 2.  Take Advil or Tylenol as needed. 3.  Follow up in 9 months.

## 2017-09-20 NOTE — Progress Notes (Signed)
NEUROLOGY FOLLOW UP OFFICE NOTE  Alison Fox [id number]  HISTORY OF PRESENT ILLNESS: Alison Fox is a 46 year old right-handed woman with glaucoma and lymphedema who follows up for tension type headache and hydrocephalus.  She is accompanied by her mother who provides some history.     UPDATE: Headaches are much improved.  Sometimes her forehead feels sore to touch on the left.   Intensity:  mild to moderate (sometimes severe) Duration:  one hour Frequency:  once a week  Abortive therapy:  Advil, Tylenol Preventative therapy:  nortriptyline 100mg   HISTORY: Headaches started in 2015.  The headaches are bi-frontal, non-throbbing sharp pain, and 8.5-9/10 intensity.  Initially, they lasted all day and occured daily.  They are associated with dizziness but not nausea.  She was taking Advil or Tylenol daily.  She denied preceding trauma or precipitating factor.  She has history of occasional mild headache, but nothing like this.  She reportedly has longstanding history of glaucoma since childhood, amblyopia of the left eye.  As per her exam from 08/07/14 by her ophthalmologist, Dr. Ames CoupeLarry Jenkins, fundi are unremarkable.     A CT of the head performed on 09/10/14 showed symmetric bilateral ventriculomegaly, enlargement of the third ventricle, minimal enlargement of the fourth ventricle and poorly visualized cerebral acqueduct.  Thinning of the overlying cortex suggests this is chronic hydrocephalus.   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  On 01/21/15, she saw Dr. Conchita [facility] at [facility].  She had a repeat MRI of the brain without contrast which demonstrated symmetric ventriculomegaly of the lateral ventricles and third ventricle without significant transependymal flow.  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  Endoscopic third ventriculostomy  was offered but not insisted since her only symptoms are headache, which is now mild.  She did not wish to proceed with this procedure.  PAST MEDICAL HISTORY: Past Medical History:  Diagnosis Date  . 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 Glaucoma 08/22/2014  . Headache   . 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 On 01/21/15, she saw Dr. Conchita ParisNundkumar at [facility].  She had a repeat MRI of the brain without contrast which demonstrated symmetric ventriculomegaly of the lateral ventricles and third ventricle without significant transependymal flow.  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  Endoscopic third ventriculostomy was offered but not insisted since her only symptoms are headache, which is now mild.  She did not wish to proceed with this procedure.  . Lymphedema    Chronic following left knee surgery in 2000.  . Varicose veins     MEDICATIONS: Current Outpatient Medications on File Prior to Visit  Medication Sig Dispense Refill  . cyclobenzaprine (FLEXERIL) 5 MG tablet Take 1 tablet (5 mg total) by mouth at bedtime. 20 tablet 1  . lamoTRIgine (LAMICTAL) 25 MG tablet Take 1 tablet (25 mg total) by mouth daily. 30 tablet 0  . nortriptyline (PAMELOR) 50 MG capsule TAKE 2 CAPSULES (100 MG TOTAL) BY MOUTH AT BEDTIME. 180 capsule 2  . Olopatadine HCl 0.2 % SOLN Place 1 drop into both eyes every morning.  3   No current facility-administered medications on file prior to visit.     ALLERGIES: No Known Allergies  FAMILY HISTORY: Family History  Problem Relation Age of Onset  .  Heart disease Maternal Aunt   . Heart disease Maternal Uncle   . Stroke Paternal Uncle   . Cancer Paternal Uncle        Melanoma  . Diabetes Maternal Grandmother   . Diabetes Paternal Grandfather     SOCIAL HISTORY: Social  History   Socioeconomic History  . Marital status: Single    Spouse name: Not on file  . Number of children: Not on file  . Years of education: Not on file  . Highest education level: Not on file  Social Needs  . Financial resource strain: Not on file  . Food insecurity - worry: Not on file  . Food insecurity - inability: Not on file  . Transportation needs - medical: Not on file  . Transportation needs - non-medical: Not on file  Occupational History  . Not on file  Tobacco Use  . Smoking status: Never Smoker  . Smokeless tobacco: Never Used  Substance and Sexual Activity  . Alcohol use: No    Alcohol/week: 0.0 oz  . Drug use: No  . Sexual activity: No  Other Topics Concern  . Not on file  Social History Narrative  . Not on file    REVIEW OF SYSTEMS: Constitutional: No fevers, chills, or sweats, no generalized fatigue, change in appetite Eyes: No visual changes, double vision, eye pain Ear, nose and throat: No hearing loss, ear pain, nasal congestion, sore throat Cardiovascular: No chest pain, palpitations Respiratory:  No shortness of breath at rest or with exertion, wheezes GastrointestinaI: No nausea, vomiting, diarrhea, abdominal pain, fecal incontinence Genitourinary:  No dysuria, urinary retention or frequency Musculoskeletal:  No neck pain, back pain Integumentary: No rash, pruritus, skin lesions Neurological: as above Psychiatric: No depression, insomnia, anxiety Endocrine: No palpitations, fatigue, diaphoresis, mood swings, change in appetite, change in weight, increased thirst Hematologic/Lymphatic:  Lymphedema Allergic/Immunologic: no itchy/runny eyes, nasal congestion, recent allergic reactions, rashes  PHYSICAL EXAM: Vitals:   09/20/17 0858  BP: 110/86  Pulse: (!) 109  SpO2: 99%   General: No acute distress.   Head:  atraumatic Eyes:  Fundi examined but not visualized Neck: supple, no paraspinal tenderness, full range of motion Heart:  Regular  rate and rhythm Lungs:  Clear to auscultation bilaterally Back: No paraspinal tenderness Neurological Exam: alert and oriented to person, place, and time. Attention span and concentration intact, recent and remote memory intact, fund of knowledge intact.  Speech fluent and not dysarthric, language intact.  CN II-XII intact. Bulk and tone normal, muscle strength 5/5 throughout.  Sensation to light touch  intact.  Deep tendon reflexes 2+ throughout.  Finger to nose testing intact.  Mild limp. Romberg negative.  IMPRESSION: Episodic tension-type headache  PLAN: Nortriptyline 100mg  at bedtime Advil or Tylenol as needed Follow up in 9 months.  Shon MilletAdam Kincade Granberg, DO  CC:  Tyson Aliasuncan Thomas Vincent, MD

## 2017-10-21 IMAGING — CR DG HIP (WITH OR WITHOUT PELVIS) 2-3V*R*
3 series · 3 of 3 positions shown · non-contrast
Comparison: None.

CLINICAL DATA: Right thigh pain for 3 weeks without known injury.

EXAM:
DG HIP (WITH OR WITHOUT PELVIS) 2-3V RIGHT

[pelvis ap]
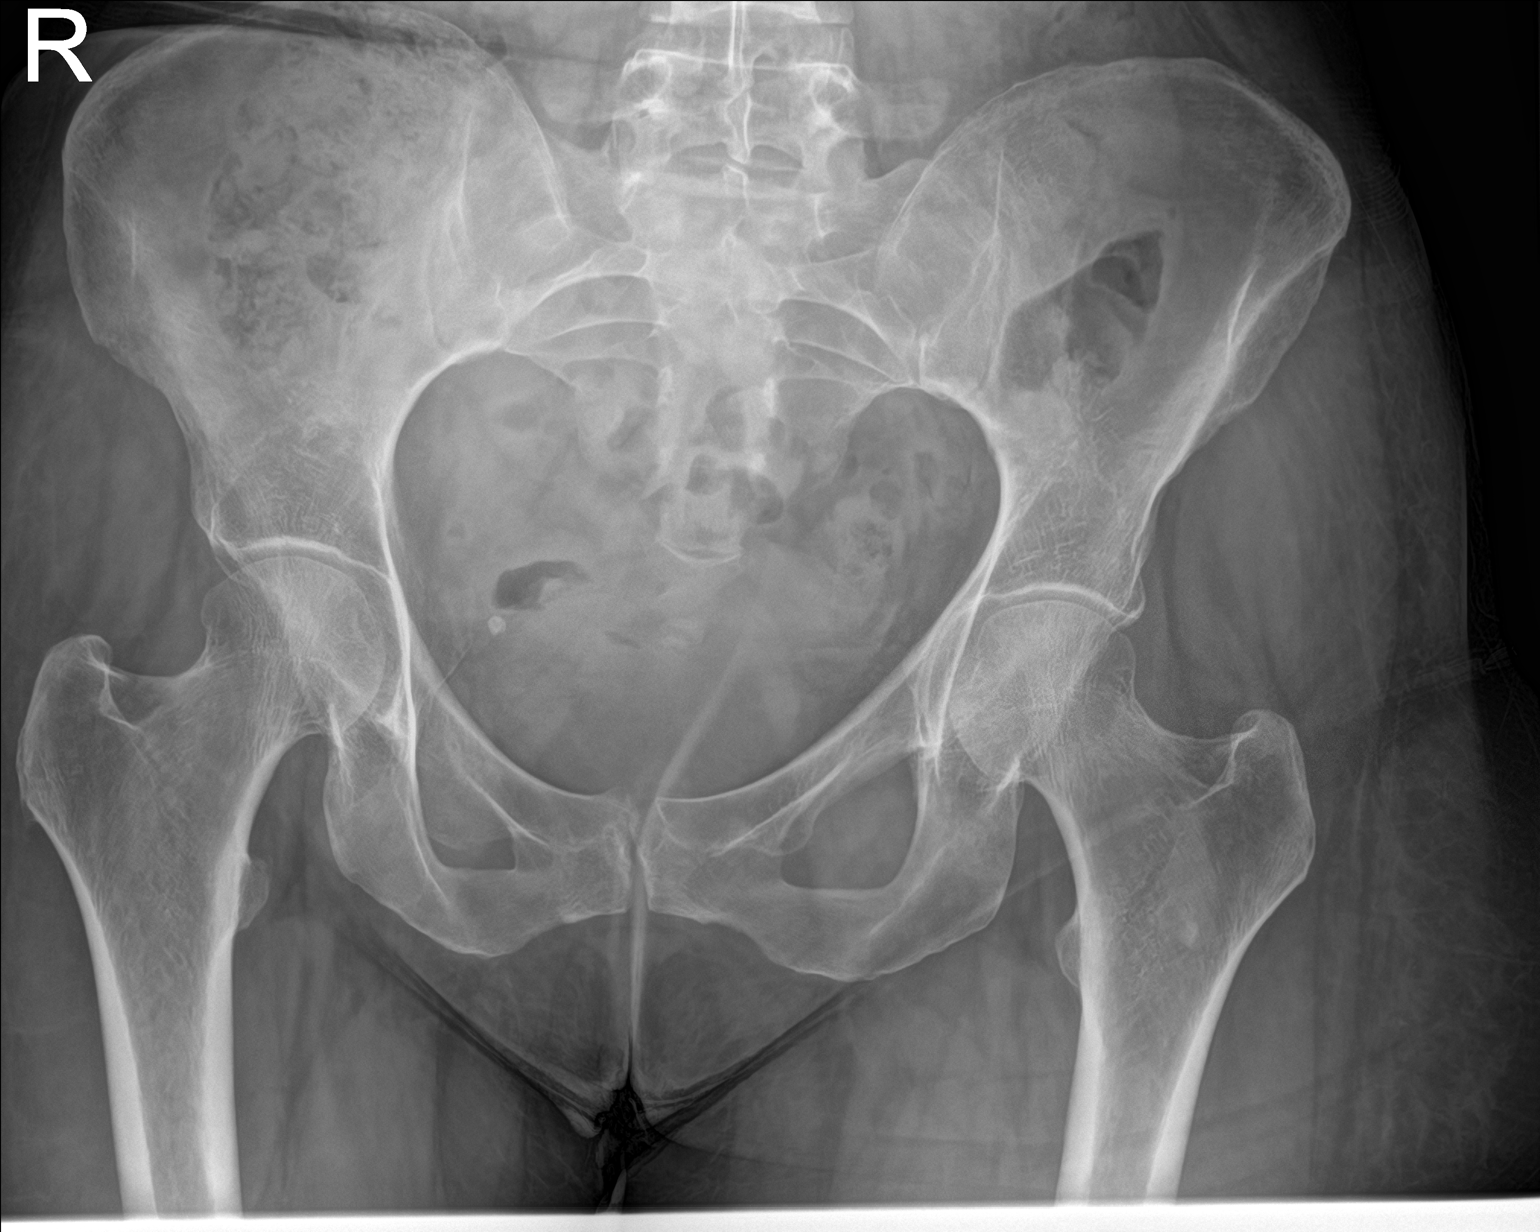

[hip ap]
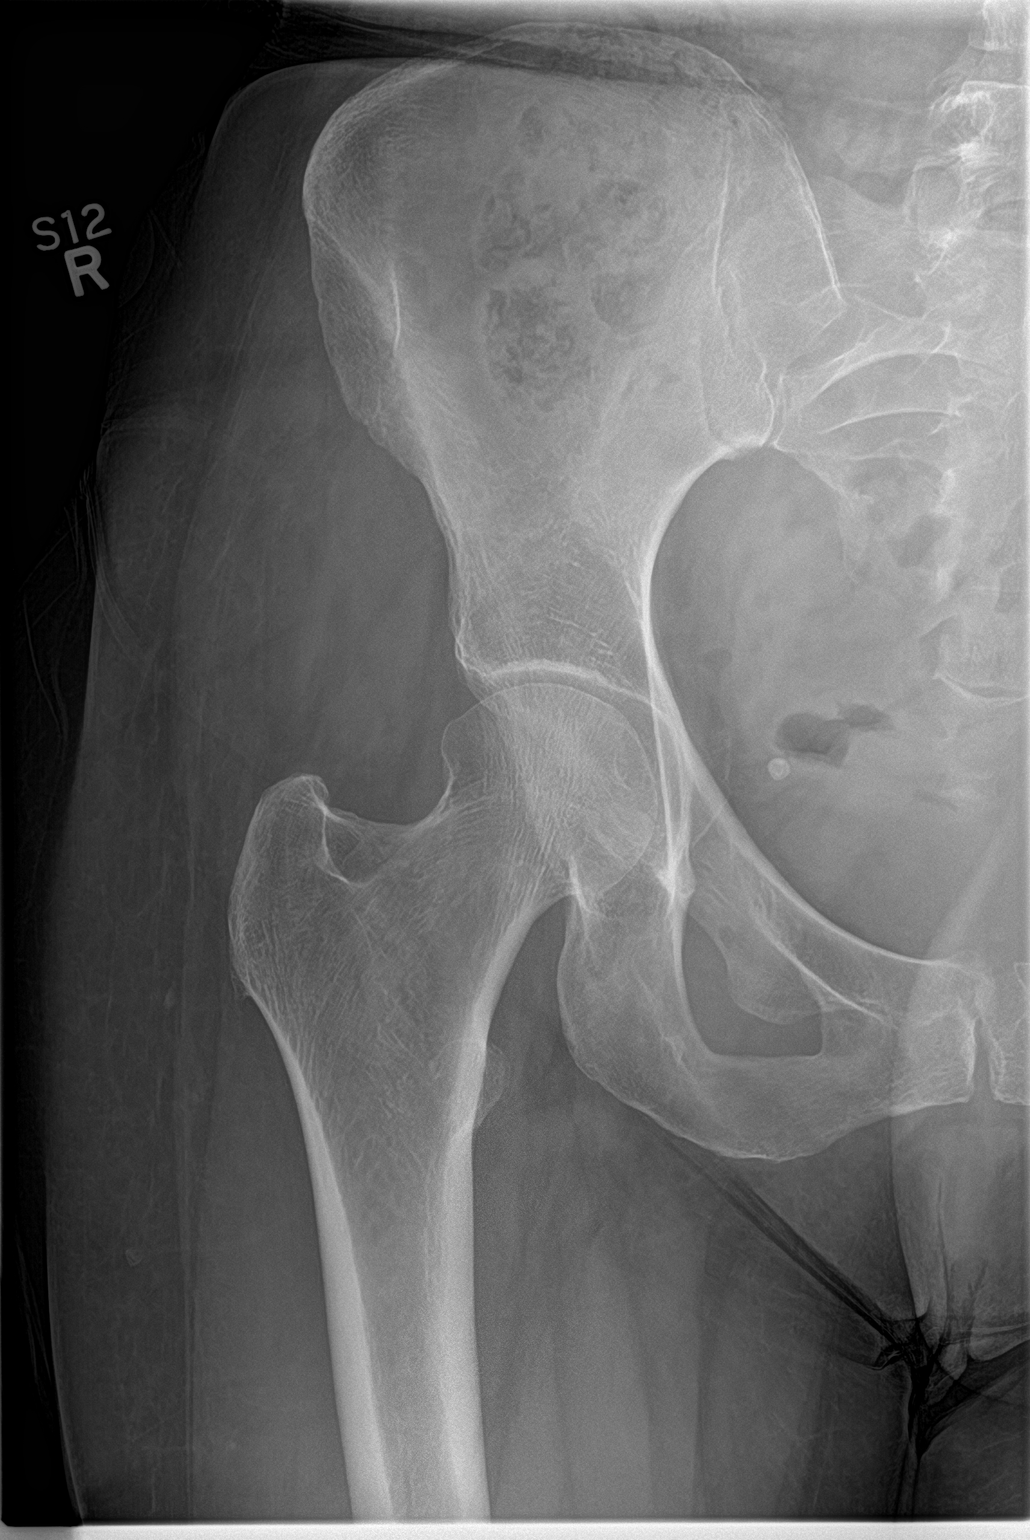

[hip lat]
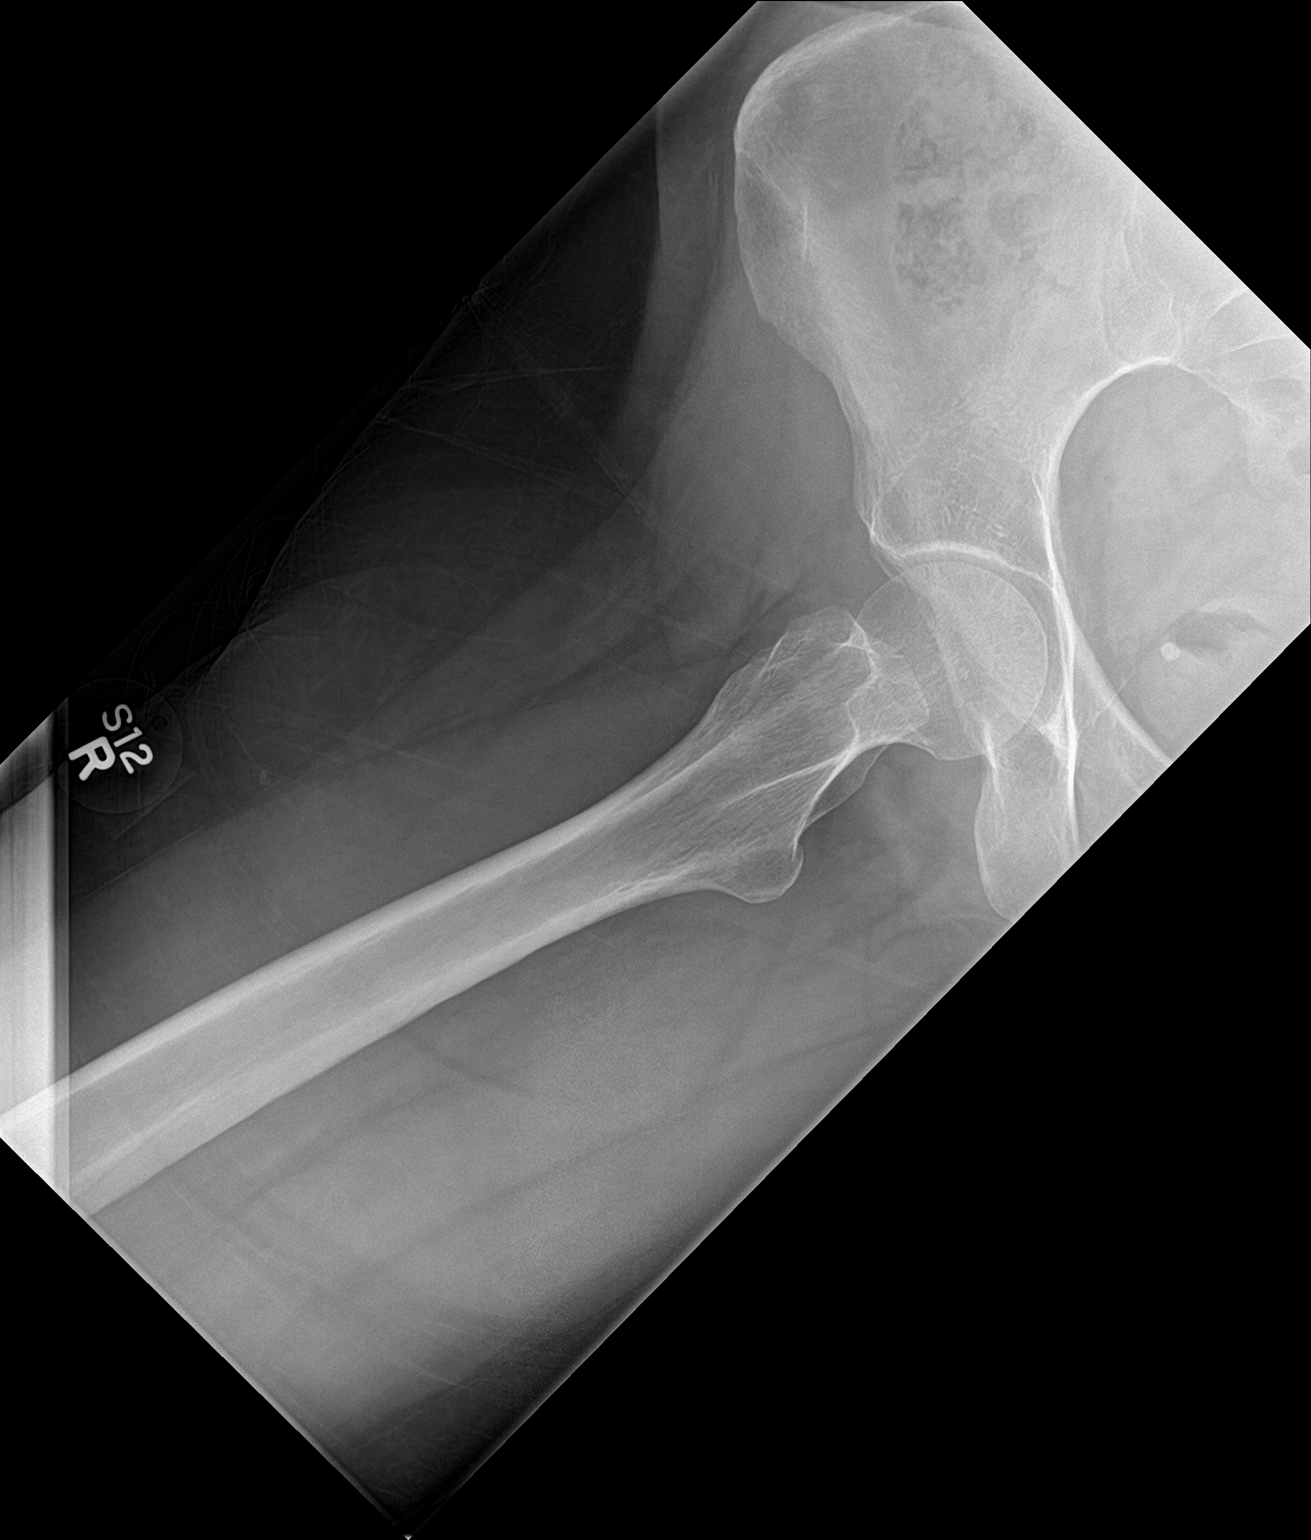

[3 of 3 positions shown; findings below may reference images not displayed]

FINDINGS: There is no evidence of hip fracture or dislocation. There is no
evidence of arthropathy or other focal bone abnormality.
IMPRESSION: Normal right hip.

## 2017-10-29 ENCOUNTER — Other Ambulatory Visit: Payer: Self-pay

## 2017-11-29 ENCOUNTER — Encounter (INDEPENDENT_AMBULATORY_CARE_PROVIDER_SITE_OTHER): Payer: Self-pay

## 2017-11-29 ENCOUNTER — Ambulatory Visit (INDEPENDENT_AMBULATORY_CARE_PROVIDER_SITE_OTHER): Payer: Medicare HMO | Admitting: Student in an Organized Health Care Education/Training Program

## 2017-11-29 ENCOUNTER — Encounter: Payer: Self-pay | Admitting: Student in an Organized Health Care Education/Training Program

## 2017-11-29 ENCOUNTER — Ambulatory Visit (HOSPITAL_COMMUNITY)
Admission: RE | Admit: 2017-11-29 | Discharge: 2017-11-29 | Disposition: A | Discharge status: Home / Self Care | Payer: Medicare HMO | Source: Ambulatory Visit | Attending: Family Medicine | Admitting: Family Medicine

## 2017-11-29 VITALS — BP 134/71 | HR 113 | Temp 97.4°F | Wt 184.3 lb

## 2017-11-29 DIAGNOSIS — I872 Venous insufficiency (chronic) (peripheral): Secondary | ICD-10-CM

## 2017-11-29 DIAGNOSIS — G44219 Episodic tension-type headache, not intractable: Secondary | ICD-10-CM

## 2017-11-29 DIAGNOSIS — I9789 Other postprocedural complications and disorders of the circulatory system, not elsewhere classified: Secondary | ICD-10-CM | POA: Diagnosis not present

## 2017-11-29 DIAGNOSIS — Z8679 Personal history of other diseases of the circulatory system: Secondary | ICD-10-CM | POA: Diagnosis not present

## 2017-11-29 DIAGNOSIS — Z23 Encounter for immunization: Secondary | ICD-10-CM | POA: Diagnosis not present

## 2017-11-29 DIAGNOSIS — R000 Tachycardia, unspecified: Secondary | ICD-10-CM | POA: Insufficient documentation

## 2017-11-29 DIAGNOSIS — I890 Lymphedema, not elsewhere classified: Secondary | ICD-10-CM

## 2017-11-29 DIAGNOSIS — Z79899 Other long term (current) drug therapy: Secondary | ICD-10-CM | POA: Diagnosis not present

## 2017-11-29 NOTE — Assessment & Plan Note (Signed)
Sinus tachycardia a little worse today than previous visits. Patient is in her usual state of health, no exacerbating factors seen on my exam. Now has symptoms of dizziness and falls, which may or may not be related. ECG is sinus and otherwise reassuring. Plan is for TSH, CBC to rule out anemia, and electrolytes. If those are normal, I will order an echo to rule out structural heart disease. I would like to lower the heart rate eventually, to lower risk of tachy-induced cardiomyopathy, but will look for underlying cause first before starting meds.

## 2017-11-29 NOTE — Assessment & Plan Note (Signed)
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  Her left leg measures 50 cm, right leg 34 cm.  She follows with the lymphedema clinic and physical therapy.  She does lymphedema pump for 1 hour a day at home.  And she does compression stockings otherwise.  She has some stasis dermatitis today but no skin breakdown.  We will continue with the supportive care.  Advised her to call if she has skin breakdown or signs of infection in the future.

## 2017-11-29 NOTE — Assessment & Plan Note (Signed)
Episodic headaches are much improved.  She has a history of hydrocephalus which are probably complicating her headache history.  We will continue with nortriptyline 100 mg once daily.  Historically she was also on lamotrigine, but she stopped taking this about 6 months ago which I think is fine so I will remove it from her medication list.

## 2017-11-29 NOTE — Patient Instructions (Signed)
1. I am more worried today about your fast heart rate. I want to run some blood work today. I will call you with the results when I have them.   2. Continue your other medication as usual, and keep up the great work managing your lymphedema.

## 2017-11-29 NOTE — Progress Notes (Signed)
   Assessment and Plan:  See Encounters tab for problem-based medical decision making.   __________________________________________________________  HPI:   47 year old woman here for routine follow-up of chronic lymphedema of the left lower extremity.  Patient has had this since at least 2001 idiopathically started after a left knee arthroscopy.  Unfortunately there is limited interventions available and so we have been pursuing supportive care since that time.  She does a good job following up with the lymphedema clinic and she does daily lymphedema pump for 1 hour followed by compression hose consistently throughout the day.  She had no skin breakdown, no recent cellulitis.  She follows with neurology for chronic hydrocephalus and episodic tension headaches Nortriptyline.  She lives with her mother and is independent in most of her activities of daily living.  Mother reports that she is having some more falls than usual recently.  Incidentally we noted that the patient was tachycardic upon walking into the clinic.  She does endorse feeling some dizziness upon standing a couple times a month, but this is been going on for quite a while.  No chest pain.  No syncope.  She does not feel palpitations.  Eating and drinking well.  No fevers.  No vomiting or diarrhea recently.  Otherwise she is in her usual state of health.  __________________________________________________________  Problem List: Patient Active Problem List   Diagnosis Date Noted  . Hydrocephalus, adult 09/10/2014    Priority: High  . Lymphedema 08/06/2006    Priority: High  . Episodic tension-type headache, not intractable 07/22/2015    Priority: Medium  . Glaucoma 08/22/2014    Priority: Low  . Preventative health care 05/18/2011    Priority: Low  . Tachycardia 11/29/2017    Medications: Reconciled today in Epic __________________________________________________________  Physical Exam:  Vital Signs: Vitals:   11/29/17 1131  BP: 134/71  Pulse: (!) 113  Temp: (!) 97.4 F (36.3 C)  TempSrc: Oral  SpO2: 100%  Weight: 184 lb 4.8 oz (83.6 kg)    Gen: Well appearing, NAD Neck: No cervical LAD, No thyromegaly or nodules, No JVD. CV: RRR, no murmurs Pulm: Normal effort, CTA throughout, no wheezing.  Back is very kyphotic.  Ext: Warm, no edema, normal joints Skin: No atypical appearing moles. No rashes.

## 2017-11-30 LAB — BMP8+ANION GAP
Anion Gap: 12 mmol/L (ref 10.0–18.0)
BUN/Creatinine Ratio: 13 (ref 9–23)
BUN: 9 mg/dL (ref 6–24)
CALCIUM: 8.8 mg/dL (ref 8.7–10.2)
CO2: 24 mmol/L (ref 20–29)
Chloride: 102 mmol/L (ref 96–106)
Creatinine, Ser: 0.68 mg/dL (ref 0.57–1.00)
GFR, EST AFRICAN AMERICAN: 122 mL/min/{1.73_m2} (ref >59)
GFR, EST NON AFRICAN AMERICAN: 106 mL/min/{1.73_m2} (ref >59)
Glucose: 89 mg/dL (ref 65–99)
POTASSIUM: 4.2 mmol/L (ref 3.5–5.2)
Sodium: 138 mmol/L (ref 134–144)

## 2017-11-30 LAB — CBC
Hematocrit: 40.9 % (ref 34.0–46.6)
Hemoglobin: 13.5 g/dL (ref 11.1–15.9)
MCH: 29.1 pg (ref 26.6–33.0)
MCHC: 33 g/dL (ref 31.5–35.7)
MCV: 88 fL (ref 79–97)
Platelets: 300 10*3/uL (ref 150–379)
RBC: 4.64 x10E6/uL (ref 3.77–5.28)
RDW: 14 % (ref 12.3–15.4)
WBC: 6.3 10*3/uL (ref 3.4–10.8)

## 2017-11-30 LAB — MAGNESIUM: Magnesium: 2.1 mg/dL (ref 1.6–2.3)

## 2017-11-30 LAB — HIV ANTIBODY (ROUTINE TESTING W REFLEX): HIV SCREEN 4TH GENERATION: NONREACTIVE

## 2017-11-30 LAB — TSH: TSH: 4.04 u[IU]/mL (ref 0.450–4.500)

## 2017-12-01 ENCOUNTER — Telehealth: Payer: Self-pay | Admitting: Student in an Organized Health Care Education/Training Program

## 2017-12-01 DIAGNOSIS — R000 Tachycardia, unspecified: Secondary | ICD-10-CM

## 2017-12-01 NOTE — Telephone Encounter (Signed)
I called Alison Fox and told her labs were all normal, no explanation for her sinus tachycardia. I explained next step was an echo to rule out structural heart disease. She understands, I have placed the order.

## 2017-12-07 ENCOUNTER — Ambulatory Visit
Payer: Medicare HMO | Attending: Student in an Organized Health Care Education/Training Program | Admitting: Occupational Therapy

## 2017-12-07 DIAGNOSIS — I890 Lymphedema, not elsewhere classified: Secondary | ICD-10-CM | POA: Diagnosis not present

## 2017-12-07 NOTE — Patient Instructions (Signed)
Pt instructed to take a break from walking outside to sit and elevate legs 10 minutes each hour .  Pt instructed to resume daily skin care before bed daily.

## 2017-12-07 NOTE — Therapy (Signed)
[place] MAIN [facility] SERVICES [address] [place], [place], [address] Phone: [phone]   Fax:  [phone]  Occupational Therapy  ReEvaluation and Treatment Note:  New POC to Resume for OT and Lymphedema Care Patient Details  Name: Alison Fox MRN: [id number] Date of Birth: 04/27/1971 No Data Recorded  Encounter Date: 12/07/2017  OT End of Session - 12/07/17 1628    Visit Number  1    Number of Visits  36    Date for OT Re-Evaluation  05/11/17    Authorization Type  28 for 2018    OT Start Time  1015    OT Stop Time  1107    OT Time Calculation (min)  52 min    Activity Tolerance  Patient tolerated treatment well;No increased pain    Behavior During Therapy  WFL for tasks assessed/performed       Past Medical History:  Diagnosis Date  . 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 Glaucoma 08/22/2014  . Headache   . 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 On 01/21/15, she saw Dr. Kathyrn Sheriff at [facility].  She had a repeat MRI of the brain without contrast which demonstrated symmetric ventriculomegaly of the lateral ventricles and third ventricle without significant transependymal flow.  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  Endoscopic third ventriculostomy was offered but not insisted since her only symptoms are headache, which is now mild.  She did not wish to proceed with this procedure.  . Lymphedema    Chronic following left knee surgery in 2000.  . Varicose veins     Past Surgical History:  Procedure Laterality Date  . EYE SURGERY    . KNEE ARTHROSCOPY  2000   Left knee.     There were no vitals filed for this visit.  Subjective Assessment - 12/07/17 1622    Subjective   Pt presents for OT visit 1 for 2019 for follow up  for long term  lymphedema management. Pt last seen on 08/25/2017. Pt and her mother attend appointment today. Pt's mother reports Pt was recently diagnosed with some "heart trouble" ( sinus tachycardia). Pt reports, "My  leg has gotten worse again. I jut want it to get better."    Patient is accompained by:  Family member    Pertinent History  onset in 25s and positive family history for maternal grandmother and cousin suggests primary LE Tarda; arthroscopic knee sx ~ 2000?, recent fall w/ head laceration. 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    Limitations  difficulty walking, decreased balance, suspected BLE suspected, R>L,      Special Tests  get the swelling down and keep it down    Patient Stated Goals  replace my stockings and get the swelling down again.    Currently in Pain?  Yes    Pain Score  4     Pain Location  Leg    Pain Orientation  Right;Left    Pain Descriptors / Indicators  Aching;Sore;Tiring;Grimacing;Tightness;Discomfort;Tender;Heaviness    Pain Type  Chronic pain    Pain Onset  -- chronic          LYMPHEDEMA/ONCOLOGY QUESTIONNAIRE - 12/07/17  1636      Right Lower Extremity Lymphedema   Other  RLE ankle to knee (A-D) volume + 2732.30 ml. RLE knee-groin volume=4226.86 ml. RLE Full leg (A-G) volume = 6959.16 ml    Other  RLE A-D limb volume is increased by 1.37% , since last measured on 08/27/16. RLE thigh volume is increased by 16.9%, and RLE full leg volume is increased by 10.25%. since last measures .      Left Lower Extremity Lymphedema   Other  LLE ankle to knee (A-D) volume= 5311.62 ml. LLE knee-groin volume= 5885.67 ml. RLE full leg A-G volume = 11197.29 ml.    Other  LLE A-D volume is increased by 12.94% since 11/27/16. LLE thigh volume is increased by 34.9%%. LLE full leg volume is increased by  23.51% overall.              OT Treatments/Exercises (OP) - 12/07/17 0001      ADLs   ADL Education Given  Yes      Manual Therapy   Manual Therapy  Edema management    Manual therapy comments  compression garment assessment    Edema Management  BLE comparative limb volumetrics             OT Education - 12/07/17 1628    Education provided  Yes    Education Details  Continued skilled Pt/caregiver education  And LE ADL training throughout visit for lymphedema self care/ home program, including compression wrapping, compression garment and device wear/care, lymphatic pumping ther ex, simple self-MLD, and skin care. Discussed progress towards goals.    Person(s) Educated  Patient;Parent(s)    Methods  Explanation;Demonstration       OT Short Term Goals - 09/01/16 1356      OT SHORT TERM GOAL #1   Title  Lymphedema (LE) management/ self-care: Pt able to apply multi layered, gradient compression wraps with MAX caregiver assistance using proper techniques within 2 weeks to achieve optimal limb volume reduction.    Baseline  max caregiver assist; caregiver min assist    Time  2    Period  Weeks    Status  New      OT SHORT TERM GOAL #2   Title  Lymphedema (LE) management/ self-care:  Pt to achieve at least 10% LLE limb volume reductions bilaterally during Intensive CDT to limit LE progression, decrease infection and falls risk, to reduce pain/, and to improve safe ambulation and functional mobility.    Baseline  max assist    Time  12    Period  Weeks    Status  New      OT SHORT TERM GOAL #3   Title  Lymphedema (LE) management/ self-care:  Pt >/= 85 % compliant with all daily, LE self-care protocols for home program w/ needed level of caregiver assistance , including simple self-manual lymphatic drainage (MLD), skin care, lymphatic pumping the ex, skin care, and donning/ doffing compression wraps and garments o limit LE progression and further functional decline.       Baseline  Max A    Time  12    Period  Weeks    Status  New      OT SHORT TERM GOAL #4   Title  Lymphedema (LE) management/ self-care:  Pt to tolerate daily compression wraps, garments and devices in keeping w/ prescribed wear regime within 1 week of issue date to progress and retain clinical and functional gains and to limit LE progression.  Baseline  mod A    Time  12    Period  Weeks    Status  New      OT SHORT TERM GOAL #5   Title  Lymphedema (LE) self-care:  During Management Phase CDT Pt to sustain limb volume reductions achieved during Intensive Phase CDT within 5% utilizing LE self-care protocols, appropriate compression garments/ devices, and needed level of caregiver assistance.    Baseline  Max A    Time  6    Period  Months    Status  New        OT Long Term Goals - 12/07/17 1629      OT LONG TERM GOAL #2   Title  Pt to achieve 15% limb volume reduction in LLE, and 10% reduction in RLE by DC to limit lymphedema (LE) progression and infection risk.    Baseline  dependent    Time  12    Period  Weeks    Status  New    Target Date  03/07/18      OT LONG TERM GOAL #3   Title  Pt to tolerate daily, thigh length ,gradient compression wrapping to thigh, one leg at a time , within one week of recommencing OT for CDT in effort to decrease BLE limb volume for improved ambulation and functional mobility, and to decrease infection risk and LE progression.    Baseline  dependent    Time  1    Period  Weeks    Status  New    Target Date  -- 2nd OT rx visit      OT LONG TERM GOAL #5   Title  Pt to remain infection free throughout CDT course to limit infection and LE progression.     Baseline  dependent    Time  12    Period  Weeks    Status  New      OT LONG TERM GOAL #6   Title  During Management Phase CDT Pt to sustain limb volume reductions achieved during Intensive Phase CDT within 5% utilizing LE self-care protocols, appropriate compression garments/ devices, and  moderate caregiver assistance.    Baseline  dependent    Time  6    Period  Months    Status  Partially Met            Plan - 12/07/17 1643    Clinical Impression Statement  Pt returns for 3 month follow along for lymphedema management . Pt presents with dramatic increase in LLE limb volume at leg, thigh and full lweg today. RLE volume below the knee is stable, but thigh is dramatically increased . RLE A-D limb volume is increased by 1.37% , since last measured on 08/27/16. RLE thigh volume is increased by 16.9%, and RLE full leg volume is increased by 10.25%. since last measures . LLE A-D volume is increased by 12.94% since 11/27/16. LLE thigh volume is increased by 34.9%%. LLE full leg volume is increased by 23.51% overall.. Skin is reddened below the knee on the left, but non-tender and temperature is WNL. both legs present with pea sized scaps from healing lymphatic blisters. RLE is dry with moderate stasis dermatitis. Pt will benefit from return to OT for Intensive Phase CDT w/ concentration on LLE to decrease limb volume, regain control of swelling, improve tissue health, decrease infection risk, and limit further functional decline in ambulation and functional mobility. Garment appear to be in good condition and Pt is using  sequebntial pump with ~ 50% compliance. Pt and mother agree with plan of care.    Occupational performance deficits (Please refer to evaluation for details):  ADL's;IADL's;Rest and Sleep;Work;Leisure;Social Participation;Other body image    Rehab Potential  Good    OT Frequency  2x / week 3 month F/U and PRN    OT Duration  12 weeks    Clinical Decision Making  Several treatment options, min-mod task modification necessary    Consulted and Agree with Plan of Care  Patient;Family member/caregiver       Patient will benefit from skilled therapeutic intervention in order to improve the following deficits and impairments:  Abnormal gait, Decreased endurance, Decreased skin  integrity, Decreased knowledge of precautions, Decreased scar mobility, Decreased knowledge of use of DME, Impaired flexibility, Decreased balance, Decreased mobility, Difficulty walking, Decreased coordination  Visit Diagnosis: Lymphedema, not elsewhere classified - Plan: Ot plan of care cert/re-cert    Problem List Patient Active Problem List   Diagnosis Date Noted  . Sinus tachycardia 11/29/2017  . Episodic tension-type headache, not intractable 07/22/2015  . Hydrocephalus, adult 09/10/2014  . Glaucoma 08/22/2014  . Preventative health care 05/18/2011  . Lymphedema 08/06/2006    Andrey Spearman, MS, OTR/L, [facility] 12/07/17 4:56 PM  [place] MAIN [facility] SERVICES [address] [place], [place], [address] Phone: [phone]   Fax:  [phone]  Name: Nicholle Falzon MRN: [id number] Date of Birth: 06/15/1971

## 2017-12-13 ENCOUNTER — Ambulatory Visit: Payer: Medicare HMO | Admitting: Occupational Therapy

## 2017-12-13 DIAGNOSIS — I890 Lymphedema, not elsewhere classified: Secondary | ICD-10-CM

## 2017-12-13 NOTE — Therapy (Signed)
[place] MAIN [facility] SERVICES [address] [place], [place], [address] Phone: [phone]   Fax:  [phone]  Occupational Therapy Treatment  Patient Details  Name: Alison Fox MRN: [id number] Date of Birth: 1971/07/09 No Data Recorded  Encounter Date: 12/13/2017  OT End of Session - 12/13/17 1222    Visit Number  1    Number of Visits  36    Date for OT Re-Evaluation  05/11/17    Authorization Type  28 for 2018    OT Start Time  1105    OT Stop Time  1215    OT Time Calculation (min)  70 min    Equipment Utilized During Treatment  NuStep    Activity Tolerance  Patient tolerated treatment well;No increased pain    Behavior During Therapy  WFL for tasks assessed/performed       Past Medical History:  Diagnosis Date  . 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 Glaucoma 08/22/2014  . Headache   . 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 On 01/21/15, she saw Dr. Kathyrn Sheriff at [facility].  She had a repeat MRI of the brain without contrast which demonstrated symmetric ventriculomegaly of the lateral ventricles and third ventricle without significant transependymal flow.  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  Endoscopic third ventriculostomy was offered but not insisted since her only symptoms are headache, which is now mild.  She did not wish to proceed with this procedure.  . Lymphedema    Chronic following left knee surgery in 2000.  . Varicose veins     Past Surgical History:  Procedure Laterality Date  . EYE SURGERY    . KNEE ARTHROSCOPY  2000   Left knee.     There were no vitals filed for this visit.  Subjective Assessment - 12/13/17 1212    Subjective   Pt presents for OT visit 2/36  for 2019 for follow up  for long term lymphedema  management. Pt presents with LLE compression wraps from toes to groin in place. Pt has no new complaints. Pt and mother agree that she has gained weight since loosing her job several months ago.     Patient is accompained by:  Family member    Pertinent History  onset in 55s and positive family history for maternal grandmother and cousin suggests primary LE Tarda; arthroscopic knee sx ~ 2000?, recent fall w/ head laceration. 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    Limitations  difficulty walking, decreased balance, suspected BLE suspected, R>L,      Special Tests  get the swelling down and keep it down    Patient Stated Goals  replace my stockings and get the swelling down again.    Currently in Pain?  Yes R calf is sore. Unchanged from initial evaluation    Pain Score  6     Pain Location  Leg    Pain Orientation  Left    Pain Descriptors / Indicators  Sore    Pain Onset  -- chronic    Pain Frequency  Intermittent    Pain Relieving Factors  elevation, compression , tylenol, advil  OT Treatments/Exercises (OP) - 12/13/17 0001      ADLs   ADL Education Given  Yes      Exercises   Exercises  -- NuStep recumbent bike to increase lymphatic pumping and enco             OT Education - 12/13/17 1221    Education provided  Yes    Education Details  Pt edu for how wight gain and increased adipose effects lymphatic function. Edu for benefits of exercise for improving lymphatic function while encouraging weight loss.    Person(s) Educated  Patient;Parent(s)    Methods  Explanation;Demonstration    Comprehension  Verbalized understanding       OT Short Term Goals - 09/01/16 1356      OT SHORT TERM GOAL #1   Title  Lymphedema (LE) management/ self-care: Pt able to apply  multi layered, gradient compression wraps with MAX caregiver assistance using proper techniques within 2 weeks to achieve optimal limb volume reduction.    Baseline  max caregiver assist; caregiver min assist    Time  2    Period  Weeks    Status  New      OT SHORT TERM GOAL #2   Title  Lymphedema (LE) management/ self-care:  Pt to achieve at least 10% LLE limb volume reductions bilaterally during Intensive CDT to limit LE progression, decrease infection and falls risk, to reduce pain/, and to improve safe ambulation and functional mobility.    Baseline  max assist    Time  12    Period  Weeks    Status  New      OT SHORT TERM GOAL #3   Title  Lymphedema (LE) management/ self-care:  Pt >/= 85 % compliant with all daily, LE self-care protocols for home program w/ needed level of caregiver assistance , including simple self-manual lymphatic drainage (MLD), skin care, lymphatic pumping the ex, skin care, and donning/ doffing compression wraps and garments o limit LE progression and further functional decline.      Baseline  Max A    Time  12    Period  Weeks    Status  New      OT SHORT TERM GOAL #4   Title  Lymphedema (LE) management/ self-care:  Pt to tolerate daily compression wraps, garments and devices in keeping w/ prescribed wear regime within 1 week of issue date to progress and retain clinical and functional gains and to limit LE progression.    Baseline  mod A    Time  12    Period  Weeks    Status  New      OT SHORT TERM GOAL #5   Title  Lymphedema (LE) self-care:  During Management Phase CDT Pt to sustain limb volume reductions achieved during Intensive Phase CDT within 5% utilizing LE self-care protocols, appropriate compression garments/ devices, and needed level of caregiver assistance.    Baseline  Max A    Time  6    Period  Months    Status  New        OT Long Term Goals - 12/07/17 1629      OT LONG TERM GOAL #2   Title  Pt to achieve 15% limb volume reduction  in LLE, and 10% reduction in RLE by DC to limit lymphedema (LE) progression and infection risk.    Baseline  dependent    Time  12    Period  Weeks  Status  New    Target Date  03/07/18      OT LONG TERM GOAL #3   Title  Pt to tolerate daily, thigh length ,gradient compression wrapping to thigh, one leg at a time , within one week of recommencing OT for CDT in effort to decrease BLE limb volume for improved ambulation and functional mobility, and to decrease infection risk and LE progression.    Baseline  dependent    Time  1    Period  Weeks    Status  New    Target Date  -- 2nd OT rx visit      OT LONG TERM GOAL #5   Title  Pt to remain infection free throughout CDT course to limit infection and LE progression.     Baseline  dependent    Time  12    Period  Weeks    Status  New      OT LONG TERM GOAL #6   Title  During Management Phase CDT Pt to sustain limb volume reductions achieved during Intensive Phase CDT within 5% utilizing LE self-care protocols, appropriate compression garments/ devices, and moderate caregiver assistance.    Baseline  dependent    Time  6    Period  Months    Status  Partially Met            Plan - 12/13/17 1224    Clinical Impression Statement  LLE presents with a dramatic limb volume reduction    today after using compression wraps to LLE full time daily since last visit. Pt's nails are well manicured and ffoot hygine is excellent compared with usual presentation. Motherprovided Max A with L nail and skin care after last appointment . She plans to repeat on RLE ASAP. Pt tolerated LLE mMLD and compression wrapping in clinic today without difficulty. Pt uitilized Nutep recumbant bike for 10 minutes without resistance in effort to facilitate improved lymphatic function and to increase activity level for weight loss. Cont as per POC.    Occupational performance deficits (Please refer to evaluation for details):  ADL's;IADL's;Rest and  Sleep;Work;Leisure;Social Participation;Other body image    Rehab Potential  Good    OT Frequency  2x / week 3 month F/U and PRN    OT Duration  12 weeks    Clinical Decision Making  Several treatment options, min-mod task modification necessary    Consulted and Agree with Plan of Care  Patient;Family member/caregiver       Patient will benefit from skilled therapeutic intervention in order to improve the following deficits and impairments:  Abnormal gait, Decreased endurance, Decreased skin integrity, Decreased knowledge of precautions, Decreased scar mobility, Decreased knowledge of use of DME, Impaired flexibility, Decreased balance, Decreased mobility, Difficulty walking, Decreased coordination  Visit Diagnosis: Lymphedema, not elsewhere classified    Problem List Patient Active Problem List   Diagnosis Date Noted  . Sinus tachycardia 11/29/2017  . Episodic tension-type headache, not intractable 07/22/2015  . Hydrocephalus, adult 09/10/2014  . Glaucoma 08/22/2014  . Preventative health care 05/18/2011  . Lymphedema 08/06/2006    Andrey Spearman, MS, OTR/L, [facility] 12/13/17 12:28 PM  [place] MAIN [facility] SERVICES [address] [place], [place], [address] Phone: [phone]   Fax:  [phone]  Name: Alison Fox MRN: [id number] Date of Birth: 1971-06-01

## 2017-12-15 ENCOUNTER — Ambulatory Visit: Payer: Medicare HMO | Admitting: Occupational Therapy

## 2017-12-15 DIAGNOSIS — I890 Lymphedema, not elsewhere classified: Secondary | ICD-10-CM

## 2017-12-15 NOTE — Therapy (Signed)
[place] MAIN [facility] SERVICES [address] [place], [place], [address] Phone: [phone]   Fax:  [phone]  Occupational Therapy Treatment  Patient Details  Name: Alison Fox MRN: [id number] Date of Birth: December 08, 1970 No Data Recorded  Encounter Date: 12/15/2017  OT End of Session - 12/15/17 1203    Visit Number  3    Number of Visits  36    Date for OT Re-Evaluation  05/11/17    Authorization Type  28 for 2018    OT Start Time  1110    OT Stop Time  1203    OT Time Calculation (min)  53 min    Equipment Utilized During Treatment  NuStep    Activity Tolerance  Patient tolerated treatment well;No increased pain    Behavior During Therapy  WFL for tasks assessed/performed       Past Medical History:  Diagnosis Date  . 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 Glaucoma 08/22/2014  . Headache   . 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 On 01/21/15, she saw Dr. Kathyrn Sheriff at [facility].  She had a repeat MRI of the brain without contrast which demonstrated symmetric ventriculomegaly of the lateral ventricles and third ventricle without significant transependymal flow.  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  Endoscopic third ventriculostomy was offered but not insisted since her only symptoms are headache, which is now mild.  She did not wish to proceed with this procedure.  . Lymphedema    Chronic following left knee surgery in 2000.  . Varicose veins     Past Surgical History:  Procedure Laterality Date  . EYE SURGERY    . KNEE ARTHROSCOPY  2000   Left knee.     There were no vitals filed for this visit.  Subjective Assessment - 12/15/17 1115    Subjective   Pt presents for OT visit 2/36  for 2019 for follow up  for long term lymphedema  management. Pt presents with LLE compression wraps from toes to groin in place. Pt has no new complaints. Pt states she enjoyed using NuStep recumbant bike last visit and would like to uise it again if possible.    Patient is accompained by:  Family member    Pertinent History  onset in 54s and positive family history for maternal grandmother and cousin suggests primary LE Tarda; arthroscopic knee sx ~ 2000?, recent fall w/ head laceration. 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    Limitations  difficulty walking, decreased balance, suspected BLE suspected, R>L,      Special Tests  get the swelling down and keep it down    Patient Stated Goals  replace my stockings and get the swelling down again.    Currently in Pain?  Yes    Pain Score  5     Pain Location  Leg    Pain Orientation  Right    Pain Descriptors / Indicators  Sore    Pain Onset  -- chronic                   OT Treatments/Exercises (OP) - 12/15/17 0001      ADLs  ADL Education Given  Yes      Exercises   Exercises  -- NuStep recumbant bike- 10 min, .25 mile, 0 resistance      Manual Therapy   Manual Therapy  Edema management;Manual Lymphatic Drainage (MLD);Compression Bandaging    Manual Lymphatic Drainage (MLD)  Manual lymph drainage (MLD) to RLE in supine utilizing functional inguinal lymph nodes and deep abdominal lymphatics as is customary for non-cancer related lower extremity LE, including bilateral "short neck" sequence, J strokes to sub and supraclavicular LN, deep abdominal pathways, functional inguinal LN, lower extremity proximal to distal w/ emphasis on medial knee bottleneck and politeal LN. Performed fibrosis technique to B maleoli and distal  legs to address fatty fibrosis. Good tolerance.    Compression Bandaging  RLE  wrpas: 2 layer gradient wrap over Rosidal foam for light supportive  compression to knee.             OT Education - 12/15/17 1117    Education provided  Yes    Education Details       Person(s) Educated  Patient;Parent(s)    Methods  Explanation;Demonstration       OT Short Term Goals - 09/01/16 1356      OT SHORT TERM GOAL #1   Title  Lymphedema (LE) management/ self-care: Pt able to apply multi layered, gradient compression wraps with MAX caregiver assistance using proper techniques within 2 weeks to achieve optimal limb volume reduction.    Baseline  max caregiver assist; caregiver min assist    Time  2    Period  Weeks    Status  New      OT SHORT TERM GOAL #2   Title  Lymphedema (LE) management/ self-care:  Pt to achieve at least 10% LLE limb volume reductions bilaterally during Intensive CDT to limit LE progression, decrease infection and falls risk, to reduce pain/, and to improve safe ambulation and functional mobility.    Baseline  max assist    Time  12    Period  Weeks    Status  New      OT SHORT TERM GOAL #3   Title  Lymphedema (LE) management/ self-care:  Pt >/= 85 % compliant with all daily, LE self-care protocols for home program w/ needed level of caregiver assistance , including simple self-manual lymphatic drainage (MLD), skin care, lymphatic pumping the ex, skin care, and donning/ doffing compression wraps and garments o limit LE progression and further functional decline.      Baseline  Max A    Time  12    Period  Weeks    Status  New      OT SHORT TERM GOAL #4   Title  Lymphedema (LE) management/ self-care:  Pt to tolerate daily compression wraps, garments and devices in keeping w/ prescribed wear regime within 1 week of issue date to progress and retain clinical and functional gains and to limit LE progression.    Baseline  mod A    Time  12    Period  Weeks    Status  New      OT SHORT TERM GOAL #5   Title  Lymphedema (LE) self-care:  During  Management Phase CDT Pt to sustain limb volume reductions achieved during Intensive Phase CDT within 5% utilizing LE self-care protocols, appropriate compression garments/ devices, and needed level of caregiver assistance.    Baseline  Max A    Time  6    Period  Months  Status  New        OT Long Term Goals - 12/07/17 1629      OT LONG TERM GOAL #2   Title  Pt to achieve 15% limb volume reduction in LLE, and 10% reduction in RLE by DC to limit lymphedema (LE) progression and infection risk.    Baseline  dependent    Time  12    Period  Weeks    Status  New    Target Date  03/07/18      OT LONG TERM GOAL #3   Title  Pt to tolerate daily, thigh length ,gradient compression wrapping to thigh, one leg at a time , within one week of recommencing OT for CDT in effort to decrease BLE limb volume for improved ambulation and functional mobility, and to decrease infection risk and LE progression.    Baseline  dependent    Time  1    Period  Weeks    Status  New    Target Date  -- 2nd OT rx visit      OT LONG TERM GOAL #5   Title  Pt to remain infection free throughout CDT course to limit infection and LE progression.     Baseline  dependent    Time  12    Period  Weeks    Status  New      OT LONG TERM GOAL #6   Title  During Management Phase CDT Pt to sustain limb volume reductions achieved during Intensive Phase CDT within 5% utilizing LE self-care protocols, appropriate compression garments/ devices, and moderate caregiver assistance.    Baseline  dependent    Time  6    Period  Months    Status  Partially Met            Plan - 12/15/17 1156    Clinical Impression Statement  Pt weight this morning = 179.5#. Pt's mother tells [place] this is a 5 # reduction since last weighed. Pt is enjoying ther ex w/ recumbant bile in the clinic today in effort to improve lymphatic pumping in BLE, and to increase activity level for weight loss. Pt cued intermittently to pace herself. She  achieved .54 miles in 1o minutes today. Pt had no difficulty tolerating MLD and compression wraps today. Cont as per POC.LLE limb volume continues to decrease below knee.    Occupational performance deficits (Please refer to evaluation for details):  ADL's;IADL's;Rest and Sleep;Work;Leisure;Social Participation;Other body image    Rehab Potential  Good    OT Frequency  2x / week 3 month F/U and PRN    OT Duration  12 weeks    Clinical Decision Making  Several treatment options, min-mod task modification necessary    Consulted and Agree with Plan of Care  Patient;Family member/caregiver       Patient will benefit from skilled therapeutic intervention in order to improve the following deficits and impairments:  Abnormal gait, Decreased endurance, Decreased skin integrity, Decreased knowledge of precautions, Decreased scar mobility, Decreased knowledge of use of DME, Impaired flexibility, Decreased balance, Decreased mobility, Difficulty walking, Decreased coordination  Visit Diagnosis: Lymphedema, not elsewhere classified    Problem List Patient Active Problem List   Diagnosis Date Noted  . Sinus tachycardia 11/29/2017  . Episodic tension-type headache, not intractable 07/22/2015  . Hydrocephalus, adult 09/10/2014  . Glaucoma 08/22/2014  . Preventative health care 05/18/2011  . Lymphedema 08/06/2006    Andrey Spearman, MS, OTR/L, CLT-LANA 12/15/17 12:09 PM  [place] MAIN [facility] SERVICES [address] [place], [place], [address] Phone: [phone]   Fax:  [phone]  Name: Alison Fox MRN: [id number] Date of Birth: [birth year]

## 2017-12-16 ENCOUNTER — Ambulatory Visit (HOSPITAL_COMMUNITY): Admission: RE | Admit: 2017-12-16 | Payer: Medicare HMO | Source: Ambulatory Visit

## 2017-12-20 ENCOUNTER — Ambulatory Visit
Payer: Medicare HMO | Attending: Student in an Organized Health Care Education/Training Program | Admitting: Occupational Therapy

## 2017-12-20 DIAGNOSIS — I890 Lymphedema, not elsewhere classified: Secondary | ICD-10-CM | POA: Insufficient documentation

## 2017-12-22 ENCOUNTER — Ambulatory Visit: Payer: Medicare HMO | Admitting: Occupational Therapy

## 2017-12-22 DIAGNOSIS — I890 Lymphedema, not elsewhere classified: Secondary | ICD-10-CM

## 2017-12-22 NOTE — Therapy (Signed)
[place] MAIN [facility] SERVICES [address] [place], [place], [address] Phone: [phone]   Fax:  [phone]  Occupational Therapy Treatment  Patient Details  Name: Alison Fox MRN: [id number] Date of Birth: 02/02/1971 No Data Recorded  Encounter Date: 12/22/2017  OT End of Session - 12/22/17 1100    Visit Number  4    Number of Visits  36    Date for OT Re-Evaluation  05/11/17    Authorization Type  28 for 2018    OT Start Time  1003    OT Stop Time  1115    OT Time Calculation (min)  72 min    Equipment Utilized During Treatment  NuStep    Activity Tolerance  Patient tolerated treatment well;No increased pain    Behavior During Therapy  WFL for tasks assessed/performed       Past Medical History:  Diagnosis Date  . 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 Glaucoma 08/22/2014  . Headache   . 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 On 01/21/15, she saw Dr. Kathyrn Sheriff at [facility].  She had a repeat MRI of the brain without contrast which demonstrated symmetric ventriculomegaly of the lateral ventricles and third ventricle without significant transependymal flow.  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  Endoscopic third ventriculostomy was offered but not insisted since her only symptoms are headache, which is now mild.  She did not wish to proceed with this procedure.  . Lymphedema    Chronic following left knee surgery in 2000.  . Varicose veins     Past Surgical History:  Procedure Laterality Date  . EYE SURGERY    . KNEE ARTHROSCOPY  2000   Left knee.     There were no vitals filed for this visit.  Subjective Assessment - 12/22/17 1008    Subjective   Pt presents for OT visit 3/36  for 2019 for follow up  for long term lymphedema management.  Pt presents with LLE compression wraps from toes to groin in place. Pt reports that legs "dont feel as sore as they normally do."    Patient is accompained by:  Family member    Pertinent History  onset in 35s and positive family history for maternal grandmother and cousin suggests primary LE Tarda; arthroscopic knee sx ~ 2000?, recent fall w/ head laceration. 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    Limitations  difficulty walking, decreased balance, suspected BLE suspected, R>L,      Special Tests  get the swelling down and keep it down    Patient Stated Goals  replace my stockings and get the swelling down again.    Currently in Pain?  Yes legs sore below the knees, R>L.     Pain Score  3     Pain Location  Leg    Pain Orientation  Right;Left    Pain Descriptors / Indicators  Sore    Pain Type  Chronic pain    Pain Onset  -- chronic                   OT Treatments/Exercises (OP) - 12/22/17 0001  ADLs   ADL Education Given  Yes      Exercises   Exercises  -- recumbent bike to facilitate improved lymphatic pumping afte      Manual Therapy   Manual Therapy  Edema management;Manual Lymphatic Drainage (MLD);Compression Bandaging             OT Education - 12/22/17 1046    Education provided  Yes    Person(s) Educated  Patient;Parent(s)    Methods  Explanation;Demonstration    Comprehension  Verbalized understanding;Returned demonstration       OT Short Term Goals - 09/01/16 1356      OT SHORT TERM GOAL #1   Title  Lymphedema (LE) management/ self-care: Pt able to apply multi layered, gradient compression wraps with MAX caregiver assistance using proper techniques within 2 weeks to achieve optimal limb volume reduction.    Baseline  max caregiver assist; caregiver min assist     Time  2    Period  Weeks    Status  New      OT SHORT TERM GOAL #2   Title  Lymphedema (LE) management/ self-care:  Pt to achieve at least 10% LLE limb volume reductions bilaterally during Intensive CDT to limit LE progression, decrease infection and falls risk, to reduce pain/, and to improve safe ambulation and functional mobility.    Baseline  max assist    Time  12    Period  Weeks    Status  New      OT SHORT TERM GOAL #3   Title  Lymphedema (LE) management/ self-care:  Pt >/= 85 % compliant with all daily, LE self-care protocols for home program w/ needed level of caregiver assistance , including simple self-manual lymphatic drainage (MLD), skin care, lymphatic pumping the ex, skin care, and donning/ doffing compression wraps and garments o limit LE progression and further functional decline.      Baseline  Max A    Time  12    Period  Weeks    Status  New      OT SHORT TERM GOAL #4   Title  Lymphedema (LE) management/ self-care:  Pt to tolerate daily compression wraps, garments and devices in keeping w/ prescribed wear regime within 1 week of issue date to progress and retain clinical and functional gains and to limit LE progression.    Baseline  mod A    Time  12    Period  Weeks    Status  New      OT SHORT TERM GOAL #5   Title  Lymphedema (LE) self-care:  During Management Phase CDT Pt to sustain limb volume reductions achieved during Intensive Phase CDT within 5% utilizing LE self-care protocols, appropriate compression garments/ devices, and needed level of caregiver assistance.    Baseline  Max A    Time  6    Period  Months    Status  New        OT Long Term Goals - 12/07/17 1629      OT LONG TERM GOAL #2   Title  Pt to achieve 15% limb volume reduction in LLE, and 10% reduction in RLE by DC to limit lymphedema (LE) progression and infection risk.    Baseline  dependent    Time  12    Period  Weeks    Status  New    Target Date  03/07/18      OT LONG  TERM GOAL #3   Title  Pt to tolerate daily, thigh length ,gradient compression wrapping to thigh, one leg at a time , within one week of recommencing OT for CDT in effort to decrease BLE limb volume for improved ambulation and functional mobility, and to decrease infection risk and LE progression.    Baseline  dependent    Time  1    Period  Weeks    Status  New    Target Date  -- 2nd OT rx visit      OT LONG TERM GOAL #5   Title  Pt to remain infection free throughout CDT course to limit infection and LE progression.     Baseline  dependent    Time  12    Period  Weeks    Status  New      OT LONG TERM GOAL #6   Title  During Management Phase CDT Pt to sustain limb volume reductions achieved during Intensive Phase CDT within 5% utilizing LE self-care protocols, appropriate compression garments/ devices, and moderate caregiver assistance.    Baseline  dependent    Time  6    Period  Months    Status  Partially Met            Plan - 12/22/17 1101    Clinical Impression Statement  Pt reportimng decreased leg pain (soreness) today. Pt tolerated MLD to LLE without difficulty. Pt performed neck sequence independently and assists w/ positioning during compression wrap application. Pt participated in therapeutic exercise  to facilitate inreeased lymphatic pumping:  15: 39 minutes/ .85 miles. minutes  without resistance. Good session. Pt continues to make progress towards all LE goals.    Occupational performance deficits (Please refer to evaluation for details):  ADL's;IADL's;Rest and Sleep;Work;Leisure;Social Participation;Other body image    Rehab Potential  Good    OT Frequency  2x / week 3 month F/U and PRN    OT Duration  12 weeks    Clinical Decision Making  Several treatment options, min-mod task modification necessary    Consulted and Agree with Plan of Care  Patient;Family member/caregiver       Patient will benefit from skilled therapeutic intervention in order to improve  the following deficits and impairments:  Abnormal gait, Decreased endurance, Decreased skin integrity, Decreased knowledge of precautions, Decreased scar mobility, Decreased knowledge of use of DME, Impaired flexibility, Decreased balance, Decreased mobility, Difficulty walking, Decreased coordination  Visit Diagnosis: Lymphedema, not elsewhere classified    Problem List Patient Active Problem List   Diagnosis Date Noted  . Sinus tachycardia 11/29/2017  . Episodic tension-type headache, not intractable 07/22/2015  . Hydrocephalus, adult 09/10/2014  . Glaucoma 08/22/2014  . Preventative health care 05/18/2011  . Lymphedema 08/06/2006    Andrey Spearman, MS, OTR/L, [facility] 12/22/17 11:19 AM  [place] MAIN [facility] SERVICES [address] [place], [place], [address] Phone: [phone]   Fax:  [phone]  Name: Alison Fox MRN: [id number] Date of Birth: 1971-02-03

## 2017-12-24 ENCOUNTER — Ambulatory Visit (HOSPITAL_COMMUNITY)
Admission: RE | Admit: 2017-12-24 | Discharge: 2017-12-24 | Disposition: A | Discharge status: Home / Self Care | Payer: Medicare HMO | Source: Ambulatory Visit | Attending: Student in an Organized Health Care Education/Training Program | Admitting: Student in an Organized Health Care Education/Training Program

## 2017-12-24 DIAGNOSIS — R000 Tachycardia, unspecified: Secondary | ICD-10-CM

## 2017-12-24 DIAGNOSIS — R9431 Abnormal electrocardiogram [ECG] [EKG]: Secondary | ICD-10-CM | POA: Diagnosis not present

## 2017-12-24 DIAGNOSIS — G919 Hydrocephalus, unspecified: Secondary | ICD-10-CM | POA: Diagnosis not present

## 2017-12-24 DIAGNOSIS — I340 Nonrheumatic mitral (valve) insufficiency: Secondary | ICD-10-CM | POA: Diagnosis not present

## 2017-12-24 NOTE — Progress Notes (Signed)
  Echocardiogram 2D Echocardiogram has been performed.  Alison Fox, Alison Fox 12/24/2017, 11:40 AM

## 2017-12-29 ENCOUNTER — Ambulatory Visit: Payer: Medicare HMO | Admitting: Occupational Therapy

## 2017-12-29 DIAGNOSIS — I890 Lymphedema, not elsewhere classified: Secondary | ICD-10-CM | POA: Diagnosis not present

## 2017-12-29 NOTE — Therapy (Signed)
[place] MAIN [facility] SERVICES [address] [place], [place], [address] Phone: [phone]   Fax:  [phone]  Occupational Therapy Treatment  Patient Details  Name: Alison Fox MRN: [id number] Date of Birth: [birth year] No Data Recorded  Encounter Date: 12/29/2017  OT End of Session - 12/29/17 1122    Visit Number  5    Number of Visits  36    Date for OT Re-Evaluation  05/11/17    Authorization Type  28 for 2018    OT Start Time  1000    OT Stop Time  1105    OT Time Calculation (min)  65 min    Equipment Utilized During Treatment  NuStep    Activity Tolerance  Patient tolerated treatment well;No increased pain    Behavior During Therapy  WFL for tasks assessed/performed       Past Medical History:  Diagnosis Date  . 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 Glaucoma 08/22/2014  . Headache   . 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 On 01/21/15, she saw Dr. Kathyrn Sheriff at [facility].  She had a repeat MRI of the brain without contrast which demonstrated symmetric ventriculomegaly of the lateral ventricles and third ventricle without significant transependymal flow.  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  Endoscopic third ventriculostomy was offered but not insisted since her only symptoms are headache, which is now mild.  She did not wish to proceed with this procedure.  . Lymphedema    Chronic following left knee surgery in 2000.  . Varicose veins     Past Surgical History:  Procedure Laterality Date  . EYE SURGERY    . KNEE ARTHROSCOPY  2000   Left knee.     There were no vitals filed for this visit.  Subjective Assessment - 12/29/17 1112    Subjective   Pt presents for OT visit 4/36  for 2019 for CDT to LLE for LE exacerbation.  Pt presents  with LLE compression wraps from toes to groin in place. Pt reports , I feel down because I don't have a job anymore. I need to work."    Patient is accompained by:  Family member    Pertinent History  onset in 61s and positive family history for maternal grandmother and cousin suggests primary LE Tarda; arthroscopic knee sx ~ 2000?, recent fall w/ head laceration. 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    Limitations  difficulty walking, decreased balance, suspected BLE suspected, R>L,      Special Tests  get the swelling down and keep it down    Patient Stated Goals  replace my stockings and get the swelling down again.    Currently in Pain?  Yes    Pain Location  Leg    Pain Orientation  Right    Pain Descriptors / Indicators  Sore    Pain Type  Chronic pain    Pain Onset  -- chronic    Pain Frequency  Intermittent          LYMPHEDEMA/ONCOLOGY QUESTIONNAIRE - 12/29/17 1115      Left Lower Extremity Lymphedema   Other  LLE ankle to  knee (A-D) volume= 4503.79  ml. LLE knee-groin volume= 5733.96 ml. RLE full leg A-G volume =10237.75 ml.    Other  LLE A-D volume is decreased by 15.2% since 12/07/17. LLE thigh volume is decreased by 2.6%, and  LLE full leg volume is decreased by 8.55% since recommencing CDT to LLE on 12/07/17 to address recent exacerbation.              OT Treatments/Exercises (OP) - 12/29/17 0001      ADLs   ADL Education Given  Yes      Manual Therapy   Manual Therapy  Edema management;Manual Lymphatic Drainage (MLD);Compression Bandaging    Manual Lymphatic Drainage (MLD)  Manual lymph drainage (MLD) to RLE in supine utilizing functional inguinal lymph nodes and deep abdominal lymphatics as is customary for non-cancer related lower extremity LE, including bilateral "short neck"  sequence, J strokes to sub and supraclavicular LN, deep abdominal pathways, functional inguinal LN, lower extremity proximal to distal w/ emphasis on medial knee bottleneck and politeal LN. Performed fibrosis technique to B maleoli and distal  legs to address fatty fibrosis. Good tolerance.    Compression Bandaging  LLE wrpas: 2 layer gradient wrap over Rosidal foam for light supportive  compression to knee.    Other Manual Therapy  Comparative limb volumetrics for LLE               OT Short Term Goals - 09/01/16 1356      OT SHORT TERM GOAL #1   Title  Lymphedema (LE) management/ self-care: Pt able to apply multi layered, gradient compression wraps with MAX caregiver assistance using proper techniques within 2 weeks to achieve optimal limb volume reduction.    Baseline  max caregiver assist; caregiver min assist    Time  2    Period  Weeks    Status  New      OT SHORT TERM GOAL #2   Title  Lymphedema (LE) management/ self-care:  Pt to achieve at least 10% LLE limb volume reductions bilaterally during Intensive CDT to limit LE progression, decrease infection and falls risk, to reduce pain/, and to improve safe ambulation and functional mobility.    Baseline  max assist    Time  12    Period  Weeks    Status  New      OT SHORT TERM GOAL #3   Title  Lymphedema (LE) management/ self-care:  Pt >/= 85 % compliant with all daily, LE self-care protocols for home program w/ needed level of caregiver assistance , including simple self-manual lymphatic drainage (MLD), skin care, lymphatic pumping the ex, skin care, and donning/ doffing compression wraps and garments o limit LE progression and further functional decline.      Baseline  Max A    Time  12    Period  Weeks    Status  New      OT SHORT TERM GOAL #4   Title  Lymphedema (LE) management/ self-care:  Pt to tolerate daily compression wraps, garments and devices in keeping w/ prescribed wear regime within 1 week of issue date to  progress and retain clinical and functional gains and to limit LE progression.    Baseline  mod A    Time  12    Period  Weeks    Status  New      OT SHORT TERM GOAL #5   Title  Lymphedema (LE) self-care:  During Management Phase CDT Pt to sustain limb volume  reductions achieved during Intensive Phase CDT within 5% utilizing LE self-care protocols, appropriate compression garments/ devices, and needed level of caregiver assistance.    Baseline  Max A    Time  6    Period  Months    Status  New        OT Long Term Goals - 12/07/17 1629      OT LONG TERM GOAL #2   Title  Pt to achieve 15% limb volume reduction in LLE, and 10% reduction in RLE by DC to limit lymphedema (LE) progression and infection risk.    Baseline  dependent    Time  12    Period  Weeks    Status  New    Target Date  03/07/18      OT LONG TERM GOAL #3   Title  Pt to tolerate daily, thigh length ,gradient compression wrapping to thigh, one leg at a time , within one week of recommencing OT for CDT in effort to decrease BLE limb volume for improved ambulation and functional mobility, and to decrease infection risk and LE progression.    Baseline  dependent    Time  1    Period  Weeks    Status  New    Target Date  -- 2nd OT rx visit      OT LONG TERM GOAL #5   Title  Pt to remain infection free throughout CDT course to limit infection and LE progression.     Baseline  dependent    Time  12    Period  Weeks    Status  New      OT LONG TERM GOAL #6   Title  During Management Phase CDT Pt to sustain limb volume reductions achieved during Intensive Phase CDT within 5% utilizing LE self-care protocols, appropriate compression garments/ devices, and moderate caregiver assistance.    Baseline  dependent    Time  6    Period  Months    Status  Partially Met            Plan - 12/29/17 1119    Clinical Impression Statement  Completed LLE compartive limb volumetrics this morning to measure limb volume  changes since recommencing LLE Intensive CDT on 12/07/17 due to recent LE exacerbation. Lim volumes are reduced alt all 3 landmarks, with greatest reduction distally at A-D ( ankle to below knee ) segement. LLE A-D volume is decreased by 15.2% since 12/07/17. LLE thigh volume is decreased by 2.6%, and  LLE full leg volume is decreased by 8.55% since recommencing CDT to LLE on 12/07/17 to address recent exacerbation. Pt tolerated MLD and compression wrap application to LLE without difficulty. No ther ex in clinic today due to  time constraints. Pt reports increased walking outdoors when weather allows since last visit.Pt demonstrates steady progress towards LWE goals.    Occupational performance deficits (Please refer to evaluation for details):  ADL's;IADL's;Rest and Sleep;Work;Leisure;Social Participation;Other body image    Rehab Potential  Good    OT Frequency  2x / week 3 month F/U and PRN    OT Duration  12 weeks    Clinical Decision Making  Several treatment options, min-mod task modification necessary    Consulted and Agree with Plan of Care  Patient;Family member/caregiver       Patient will benefit from skilled therapeutic intervention in order to improve the following deficits and impairments:  Abnormal gait, Decreased endurance, Decreased skin integrity, Decreased knowledge of precautions, Decreased  scar mobility, Decreased knowledge of use of DME, Impaired flexibility, Decreased balance, Decreased mobility, Difficulty walking, Decreased coordination  Visit Diagnosis: Lymphedema, not elsewhere classified    Problem List Patient Active Problem List   Diagnosis Date Noted  . Sinus tachycardia 11/29/2017  . Episodic tension-type headache, not intractable 07/22/2015  . Hydrocephalus, adult 09/10/2014  . Glaucoma 08/22/2014  . Preventative health care 05/18/2011  . Lymphedema 08/06/2006    Andrey Spearman, MS, OTR/L, [facility] 12/29/17 11:31 AM  [place] MAIN [facility] SERVICES [address] [place], [place], [address] Phone: [phone]   Fax:  [phone]  Name: Alison Fox MRN: [id number] Date of Birth: [birth year]

## 2017-12-30 ENCOUNTER — Encounter: Payer: Self-pay | Admitting: Student in an Organized Health Care Education/Training Program

## 2017-12-30 DIAGNOSIS — I340 Nonrheumatic mitral (valve) insufficiency: Secondary | ICD-10-CM | POA: Insufficient documentation

## 2018-01-03 ENCOUNTER — Encounter: Payer: Self-pay | Admitting: Occupational Therapy

## 2018-01-03 ENCOUNTER — Ambulatory Visit: Payer: Medicare HMO | Admitting: Occupational Therapy

## 2018-01-03 DIAGNOSIS — I890 Lymphedema, not elsewhere classified: Secondary | ICD-10-CM | POA: Diagnosis not present

## 2018-01-03 NOTE — Therapy (Signed)
[place] MAIN [facility] SERVICES [address] [place], [place], [address] Phone: [phone]   Fax:  [phone]  Occupational Therapy Treatment  Patient Details  Name: Alison Fox MRN: [id number] Date of Birth: 12/15/1970 No Data Recorded  Encounter Date: 01/03/2018  OT End of Session - 01/03/18 0923    Visit Number  6    Number of Visits  36    Date for OT Re-Evaluation  05/11/17    Authorization Type  28 for 2018    OT Start Time  0800    OT Stop Time  0923    OT Time Calculation (min)  83 min    Equipment Utilized During Treatment  NuStep    Activity Tolerance  Patient tolerated treatment well;No increased pain    Behavior During Therapy  WFL for tasks assessed/performed       Past Medical History:  Diagnosis Date  . 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 Glaucoma 08/22/2014  . Headache   . 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 On 01/21/15, she saw Dr. Kathyrn Sheriff at [facility].  She had a repeat MRI of the brain without contrast which demonstrated symmetric ventriculomegaly of the lateral ventricles and third ventricle without significant transependymal flow.  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  Endoscopic third ventriculostomy was offered but not insisted since her only symptoms are headache, which is now mild.  She did not wish to proceed with this procedure.  . Lymphedema    Chronic following left knee surgery in 2000.  . Varicose veins     Past Surgical History:  Procedure Laterality Date  . EYE SURGERY    . KNEE ARTHROSCOPY  2000   Left knee.     There were no vitals filed for this visit.  Subjective Assessment - 01/03/18 0805    Subjective   Pt presents for OT visit 6/36  for 2019 for CDT to LLE for LE exacerbation.  Pt presents  with LLE compression wraps from toes to groin in place. Pt has no complaints or concerns this morning . She denies pain.     Patient is accompained by:  Family member    Pertinent History  onset in 67s and positive family history for maternal grandmother and cousin suggests primary LE Tarda; arthroscopic knee sx ~ 2000?, recent fall w/ head laceration. 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    Limitations  difficulty walking, decreased balance, suspected BLE suspected, R>L,      Special Tests  get the swelling down and keep it down    Patient Stated Goals  replace my stockings and get the swelling down again.    Currently in Pain?  No/denies    Pain Onset  -- chronic                   OT Treatments/Exercises (OP) - 01/03/18 0001      ADLs   ADL Education Given  Yes      Manual Therapy   Manual Therapy  Edema management;Compression Bandaging;Manual Lymphatic Drainage (MLD)    Edema Management  Ther Ex on recumbent bike (NuStep) for 15 minutes at moderate  pace and no resistance  to facilitate increased lymphatic pumping    Manual Lymphatic Drainage (MLD)  Manual lymph drainage (MLD) to RLE in supine utilizing functional inguinal lymph nodes and deep abdominal lymphatics as is customary for non-cancer related lower extremity LE, including bilateral "short neck" sequence, J strokes to sub and supraclavicular LN, deep abdominal pathways, functional inguinal LN, lower extremity proximal to distal w/ emphasis on medial knee bottleneck and politeal LN. Performed fibrosis technique to B maleoli and distal  legs to address fatty fibrosis. Good tolerance.             OT Education - 01/03/18 0926    Education provided  Yes    Education Details  Pt edu re LE self care w/ emphasis on benefits of  therapeutic exercise for improving lymphatic function, importance of elevation when seated, and replacing compression garments before they are worn out for optimal   LE control.    Person(s) Educated  Patient;Parent(s)    Methods  Explanation;Demonstration    Comprehension  Verbalized understanding;Returned demonstration       OT Short Term Goals - 09/01/16 1356      OT SHORT TERM GOAL #1   Title  Lymphedema (LE) management/ self-care: Pt able to apply multi layered, gradient compression wraps with MAX caregiver assistance using proper techniques within 2 weeks to achieve optimal limb volume reduction.    Baseline  max caregiver assist; caregiver min assist    Time  2    Period  Weeks    Status  New      OT SHORT TERM GOAL #2   Title  Lymphedema (LE) management/ self-care:  Pt to achieve at least 10% LLE limb volume reductions bilaterally during Intensive CDT to limit LE progression, decrease infection and falls risk, to reduce pain/, and to improve safe ambulation and functional mobility.    Baseline  max assist    Time  12    Period  Weeks    Status  New      OT SHORT TERM GOAL #3   Title  Lymphedema (LE) management/ self-care:  Pt >/= 85 % compliant with all daily, LE self-care protocols for home program w/ needed level of caregiver assistance , including simple self-manual lymphatic drainage (MLD), skin care, lymphatic pumping the ex, skin care, and donning/ doffing compression wraps and garments o limit LE progression and further functional decline.      Baseline  Max A    Time  12    Period  Weeks    Status  New      OT SHORT TERM GOAL #4   Title  Lymphedema (LE) management/ self-care:  Pt to tolerate daily compression wraps, garments and devices in keeping w/ prescribed wear regime within 1 week of issue date to progress and retain clinical and functional gains and to limit LE progression.    Baseline  mod A    Time  12    Period  Weeks    Status  New      OT SHORT TERM  GOAL #5   Title  Lymphedema (LE) self-care:  During Management Phase CDT Pt to sustain limb volume reductions achieved during Intensive Phase CDT within 5% utilizing LE self-care protocols, appropriate compression garments/ devices, and needed level of caregiver assistance.    Baseline  Max A    Time  6    Period  Months    Status  New  OT Long Term Goals - 12/07/17 1629      OT LONG TERM GOAL #2   Title  Pt to achieve 15% limb volume reduction in LLE, and 10% reduction in RLE by DC to limit lymphedema (LE) progression and infection risk.    Baseline  dependent    Time  12    Period  Weeks    Status  New    Target Date  03/07/18      OT LONG TERM GOAL #3   Title  Pt to tolerate daily, thigh length ,gradient compression wrapping to thigh, one leg at a time , within one week of recommencing OT for CDT in effort to decrease BLE limb volume for improved ambulation and functional mobility, and to decrease infection risk and LE progression.    Baseline  dependent    Time  1    Period  Weeks    Status  New    Target Date  -- 2nd OT rx visit      OT LONG TERM GOAL #5   Title  Pt to remain infection free throughout CDT course to limit infection and LE progression.     Baseline  dependent    Time  12    Period  Weeks    Status  New      OT LONG TERM GOAL #6   Title  During Management Phase CDT Pt to sustain limb volume reductions achieved during Intensive Phase CDT within 5% utilizing LE self-care protocols, appropriate compression garments/ devices, and moderate caregiver assistance.    Baseline  dependent    Time  6    Period  Months    Status  Partially Met            Plan - 01/03/18 0928    Clinical Impression Statement  LLE limb volume is markedly decreased today with palpable  decrease in limb density , improved skin mobility and improved knee and ankle AROM. Pt and mother state they are pleased with her progress so far. Provided LE edu     for LE self-care.  Provided MLD and compression wraps toes to groin for LLE. Pt performed 15 min supervised ther ex on recumbent biike to facilitate improved lymphatic pumping/ Pt bettwer at pacing herself today to keep heart rate from increasing too much. Cont as per POC.    Occupational performance deficits (Please refer to evaluation for details):  ADL's;IADL's;Rest and Sleep;Work;Leisure;Social Participation;Other body image    Rehab Potential  Good    OT Frequency  2x / week 3 month F/U and PRN    OT Duration  12 weeks    Clinical Decision Making  Several treatment options, min-mod task modification necessary    Consulted and Agree with Plan of Care  Patient;Family member/caregiver       Patient will benefit from skilled therapeutic intervention in order to improve the following deficits and impairments:  Abnormal gait, Decreased endurance, Decreased skin integrity, Decreased knowledge of precautions, Decreased scar mobility, Decreased knowledge of use of DME, Impaired flexibility, Decreased balance, Decreased mobility, Difficulty walking, Decreased coordination  Visit Diagnosis: Lymphedema, not elsewhere classified    Problem List Patient Active Problem List   Diagnosis Date Noted  . Mitral regurgitation 12/30/2017  . Sinus tachycardia 11/29/2017  . Episodic tension-type headache, not intractable 07/22/2015  . Hydrocephalus, adult 09/10/2014  . Glaucoma 08/22/2014  . Preventative health care 05/18/2011  . Lymphedema 08/06/2006    Andrey Spearman, MS, OTR/L, CLT-LANA 01/03/18 9:31  AM  [place] MAIN [facility] SERVICES [address] [place], [place], [address] Phone: [phone]   Fax:  [phone]  Name: Alison Fox MRN: [id number] Date of Birth: [birth year]

## 2018-01-05 ENCOUNTER — Ambulatory Visit: Payer: Medicare HMO | Admitting: Occupational Therapy

## 2018-01-05 DIAGNOSIS — I890 Lymphedema, not elsewhere classified: Secondary | ICD-10-CM

## 2018-01-05 NOTE — Therapy (Signed)
[place] MAIN [facility] SERVICES [address] [place], [place], [address] Phone: [phone]   Fax:  [phone]  Occupational Therapy Treatment  Patient Details  Name: Alison Fox MRN: [id number] Date of Birth: November 25, 1970 No Data Recorded  Encounter Date: 01/05/2018  OT End of Session - 01/05/18 1215    Visit Number  7    Number of Visits  36    Date for OT Re-Evaluation  05/11/17    Authorization Type  28 for 2018    OT Start Time  1115    OT Stop Time  1230    OT Time Calculation (min)  75 min    Equipment Utilized During Treatment  NuStep recumbent bike    Activity Tolerance  Patient tolerated treatment well;No increased pain    Behavior During Therapy  WFL for tasks assessed/performed       Past Medical History:  Diagnosis Date  . 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 Glaucoma 08/22/2014  . Headache   . 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 On 01/21/15, she saw Dr. Kathyrn Sheriff at [facility].  She had a repeat MRI of the brain without contrast which demonstrated symmetric ventriculomegaly of the lateral ventricles and third ventricle without significant transependymal flow.  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  Endoscopic third ventriculostomy was offered but not insisted since her only symptoms are headache, which is now mild.  She did not wish to proceed with this procedure.  . Lymphedema    Chronic following left knee surgery in 2000.  . Varicose veins     Past Surgical History:  Procedure Laterality Date  . EYE SURGERY    . KNEE ARTHROSCOPY  2000   Left knee.     There were no vitals filed for this visit.  Subjective Assessment - 01/05/18 1123    Subjective   Pt presents for OT visit 7/36  for 2019 for CDT to LLE for LE  exacerbation.  Pt presents with LLE compression wraps from toes to groin in place. Pt is in a low mood . "I need a job."    Patient is accompained by:  Family member    Pertinent History  onset in 29s and positive family history for maternal grandmother and cousin suggests primary LE Tarda; arthroscopic knee sx ~ 2000?, recent fall w/ head laceration. 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    Limitations  difficulty walking, decreased balance, suspected BLE suspected, R>L,      Special Tests  get the swelling down and keep it down    Patient Stated Goals  replace my stockings and get the swelling down again.    Currently in Pain?  No/denies    Pain Onset  -- chronic                   OT Treatments/Exercises (OP) - 01/05/18 0001      ADLs   ADL Education Given  Yes      Manual Therapy   Manual Therapy  Edema management;Compression Bandaging;Manual Lymphatic Drainage (MLD)    Edema Management  Ther Ex on recumbent bike (NuStep) for 17 minutes at moderate  pace and no resistance  to facilitate increased lymphatic pumping    Manual Lymphatic Drainage (MLD)  Manual lymph drainage (MLD) to RLE in supine utilizing functional inguinal lymph nodes and deep abdominal lymphatics as is customary for non-cancer related lower extremity LE, including bilateral "short neck" sequence, J strokes to sub and supraclavicular LN, deep abdominal pathways, functional inguinal LN, lower extremity proximal to distal w/ emphasis on medial knee bottleneck and politeal LN. Performed fibrosis technique to B maleoli and distal  legs to address fatty fibrosis. Good tolerance.    Compression Bandaging  LLE wrpas: 2 layer gradient wrap over Rosidal foam for light supportive  compression to knee.             OT Education -  01/05/18 1214    Education provided  Yes    Education Details  LE selfd care edu ongoing throughout session    Person(s) Educated  Patient;Parent(s)    Methods  Explanation;Demonstration    Comprehension  Verbalized understanding;Returned demonstration       OT Short Term Goals - 09/01/16 1356      OT SHORT TERM GOAL #1   Title  Lymphedema (LE) management/ self-care: Pt able to apply multi layered, gradient compression wraps with MAX caregiver assistance using proper techniques within 2 weeks to achieve optimal limb volume reduction.    Baseline  max caregiver assist; caregiver min assist    Time  2    Period  Weeks    Status  New      OT SHORT TERM GOAL #2   Title  Lymphedema (LE) management/ self-care:  Pt to achieve at least 10% LLE limb volume reductions bilaterally during Intensive CDT to limit LE progression, decrease infection and falls risk, to reduce pain/, and to improve safe ambulation and functional mobility.    Baseline  max assist    Time  12    Period  Weeks    Status  New      OT SHORT TERM GOAL #3   Title  Lymphedema (LE) management/ self-care:  Pt >/= 85 % compliant with all daily, LE self-care protocols for home program w/ needed level of caregiver assistance , including simple self-manual lymphatic drainage (MLD), skin care, lymphatic pumping the ex, skin care, and donning/ doffing compression wraps and garments o limit LE progression and further functional decline.      Baseline  Max A    Time  12    Period  Weeks    Status  New      OT SHORT TERM GOAL #4   Title  Lymphedema (LE) management/ self-care:  Pt to tolerate daily compression wraps, garments and devices in keeping w/ prescribed wear regime within 1 week of issue date to progress and retain clinical and functional gains and to limit LE progression.    Baseline  mod A    Time  12    Period  Weeks    Status  New      OT SHORT TERM GOAL #5   Title  Lymphedema (LE) self-care:  During Management Phase  CDT Pt to sustain limb volume reductions achieved during Intensive Phase CDT within 5% utilizing LE self-care protocols, appropriate compression garments/ devices, and needed level of caregiver assistance.    Baseline  Max A    Time  6    Period  Months    Status  New        OT Long Term Goals - 12/07/17 5188  OT LONG TERM GOAL #2   Title  Pt to achieve 15% limb volume reduction in LLE, and 10% reduction in RLE by DC to limit lymphedema (LE) progression and infection risk.    Baseline  dependent    Time  12    Period  Weeks    Status  New    Target Date  03/07/18      OT LONG TERM GOAL #3   Title  Pt to tolerate daily, thigh length ,gradient compression wrapping to thigh, one leg at a time , within one week of recommencing OT for CDT in effort to decrease BLE limb volume for improved ambulation and functional mobility, and to decrease infection risk and LE progression.    Baseline  dependent    Time  1    Period  Weeks    Status  New    Target Date  -- 2nd OT rx visit      OT LONG TERM GOAL #5   Title  Pt to remain infection free throughout CDT course to limit infection and LE progression.     Baseline  dependent    Time  12    Period  Weeks    Status  New      OT LONG TERM GOAL #6   Title  During Management Phase CDT Pt to sustain limb volume reductions achieved during Intensive Phase CDT within 5% utilizing LE self-care protocols, appropriate compression garments/ devices, and moderate caregiver assistance.    Baseline  dependent    Time  6    Period  Months    Status  Partially Met            Plan - 01/05/18 1215    Clinical Impression Statement  Pt less interactive during OT session today. She had no difficulty tolerating manual lymphatic drainage, skin care and compression wraps. She utilized recumbent bike at end of session for 15 min to facilitate increased lymphatic pum,ping. Pt enjoys this activity and has improved pacing with verbal cues. Pt asked to  bring compression garment oday to assess fit. She forgot to bring it to clinic, so we'll assess fit next visit, and initiate plan for new garment replacements.    Occupational performance deficits (Please refer to evaluation for details):  ADL's;IADL's;Rest and Sleep;Work;Leisure;Social Participation;Other body image    Rehab Potential  Good    OT Frequency  2x / week 3 month F/U and PRN    OT Duration  12 weeks    Clinical Decision Making  Several treatment options, min-mod task modification necessary    Consulted and Agree with Plan of Care  Patient;Family member/caregiver       Patient will benefit from skilled therapeutic intervention in order to improve the following deficits and impairments:  Abnormal gait, Decreased endurance, Decreased skin integrity, Decreased knowledge of precautions, Decreased scar mobility, Decreased knowledge of use of DME, Impaired flexibility, Decreased balance, Decreased mobility, Difficulty walking, Decreased coordination  Visit Diagnosis: Lymphedema, not elsewhere classified    Problem List Patient Active Problem List   Diagnosis Date Noted  . Mitral regurgitation 12/30/2017  . Sinus tachycardia 11/29/2017  . Episodic tension-type headache, not intractable 07/22/2015  . Hydrocephalus, adult 09/10/2014  . Glaucoma 08/22/2014  . Preventative health care 05/18/2011  . Lymphedema 08/06/2006    Andrey Spearman, MS, OTR/L, [facility] 01/05/18 12:33 PM  [place] MAIN [facility] SERVICES [address] [place], [place], [address] Phone: [phone]   Fax:  [phone]  Name: Alison Fox MRN: [id number] Date of Birth: [birth year]

## 2018-01-10 ENCOUNTER — Ambulatory Visit: Payer: Medicare HMO | Admitting: Occupational Therapy

## 2018-01-10 DIAGNOSIS — I890 Lymphedema, not elsewhere classified: Secondary | ICD-10-CM

## 2018-01-10 NOTE — Therapy (Signed)
[place] MAIN [facility] SERVICES [address] [place], [place], [address] Phone: [phone]   Fax:  [phone]  Occupational Therapy Treatment  Patient Details  Name: Alison Fox MRN: [id number] Date of Birth: 1971/05/25 No data recorded  Encounter Date: 01/10/2018  OT End of Session - 01/10/18 0806    Visit Number  8    Number of Visits  36    Date for OT Re-Evaluation  05/11/17    Authorization Type  28 for 2018    OT Start Time  0802    OT Stop Time  0903    OT Time Calculation (min)  61 min    Equipment Utilized During Treatment  --    Activity Tolerance  Patient tolerated treatment well;No increased pain    Behavior During Therapy  WFL for tasks assessed/performed       Past Medical History:  Diagnosis Date  . 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 Glaucoma 08/22/2014  . Headache   . 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 On 01/21/15, she saw Dr. Kathyrn Sheriff at [facility].  She had a repeat MRI of the brain without contrast which demonstrated symmetric ventriculomegaly of the lateral ventricles and third ventricle without significant transependymal flow.  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  Endoscopic third ventriculostomy was offered but not insisted since her only symptoms are headache, which is now mild.  She did not wish to proceed with this procedure.  . Lymphedema    Chronic following left knee surgery in 2000.  . Varicose veins     Past Surgical History:  Procedure Laterality Date  . EYE SURGERY    . KNEE ARTHROSCOPY  2000   Left knee.     There were no vitals filed for this visit.  Subjective Assessment - 01/10/18 0807    Subjective   Pt presents for OT visit 8/36  for 2019 for CDT to LLE for LE exacerbation.  Pt presents  with LLE compression wraps from toes to groin in place and RLE knee length compression stocking in place. Pt reporting increased pain in RLE this morning. "It hurts sometimes, but not usually like this."    Patient is accompained by:  Family member    Pertinent History  onset in 44s and positive family history for maternal grandmother and cousin suggests primary LE Tarda; arthroscopic knee sx ~ 2000?, recent fall w/ head laceration. 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    Limitations  difficulty walking, decreased balance, suspected BLE suspected, R>L,      Special Tests  get the swelling down and keep it down    Patient Stated Goals  replace my stockings and get the swelling down again.    Currently in Pain?  Yes    Pain Score  5     Pain Location  Leg    Pain Orientation  Right    Pain Descriptors / Indicators  Sharp;Sore;Aching    Pain Type  Chronic pain    Pain Onset  Yesterday chronic    Pain Frequency  Intermittent  OT Education - 01/10/18 1151    Education provided  Yes    Education Details  Cont LE self care edu throughout session. Pt able to perform "bottle neck " sequence at R and L medial tibial tuberosity by end of session    Person(s) Educated  Patient;Parent(s)    Methods  Explanation;Demonstration    Comprehension  Verbalized understanding       OT Short Term Goals - 09/01/16 1356      OT SHORT TERM GOAL #1   Title  Lymphedema (LE) management/ self-care: Pt able to apply multi layered, gradient compression wraps with MAX caregiver assistance using proper techniques within 2 weeks to achieve optimal limb volume reduction.    Baseline  max caregiver assist; caregiver min assist    Time  2    Period  Weeks    Status  New      OT SHORT TERM  GOAL #2   Title  Lymphedema (LE) management/ self-care:  Pt to achieve at least 10% LLE limb volume reductions bilaterally during Intensive CDT to limit LE progression, decrease infection and falls risk, to reduce pain/, and to improve safe ambulation and functional mobility.    Baseline  max assist    Time  12    Period  Weeks    Status  New      OT SHORT TERM GOAL #3   Title  Lymphedema (LE) management/ self-care:  Pt >/= 85 % compliant with all daily, LE self-care protocols for home program w/ needed level of caregiver assistance , including simple self-manual lymphatic drainage (MLD), skin care, lymphatic pumping the ex, skin care, and donning/ doffing compression wraps and garments o limit LE progression and further functional decline.      Baseline  Max A    Time  12    Period  Weeks    Status  New      OT SHORT TERM GOAL #4   Title  Lymphedema (LE) management/ self-care:  Pt to tolerate daily compression wraps, garments and devices in keeping w/ prescribed wear regime within 1 week of issue date to progress and retain clinical and functional gains and to limit LE progression.    Baseline  mod A    Time  12    Period  Weeks    Status  New      OT SHORT TERM GOAL #5   Title  Lymphedema (LE) self-care:  During Management Phase CDT Pt to sustain limb volume reductions achieved during Intensive Phase CDT within 5% utilizing LE self-care protocols, appropriate compression garments/ devices, and needed level of caregiver assistance.    Baseline  Max A    Time  6    Period  Months    Status  New        OT Long Term Goals - 12/07/17 1629      OT LONG TERM GOAL #2   Title  Pt to achieve 15% limb volume reduction in LLE, and 10% reduction in RLE by DC to limit lymphedema (LE) progression and infection risk.    Baseline  dependent    Time  12    Period  Weeks    Status  New    Target Date  03/07/18      OT LONG TERM GOAL #3   Title  Pt to tolerate daily, thigh length ,gradient  compression wrapping to thigh, one leg at a time , within one week of recommencing OT for CDT in  effort to decrease BLE limb volume for improved ambulation and functional mobility, and to decrease infection risk and LE progression.    Baseline  dependent    Time  1    Period  Weeks    Status  New    Target Date  -- 2nd OT rx visit      OT LONG TERM GOAL #5   Title  Pt to remain infection free throughout CDT course to limit infection and LE progression.     Baseline  dependent    Time  12    Period  Weeks    Status  New      OT LONG TERM GOAL #6   Title  During Management Phase CDT Pt to sustain limb volume reductions achieved during Intensive Phase CDT within 5% utilizing LE self-care protocols, appropriate compression garments/ devices, and moderate caregiver assistance.    Baseline  dependent    Time  6    Period  Months    Status  Partially Met            Plan - 01/10/18 1153    Clinical Impression Statement  Provided MLD and skin care to RLE today due to c/o worsening pain at distal medial leg. Pt reported decreased pain to 1/10 by end of session. Skin hydration improved w/ lotion and castor oil application. Heat in skin 2/2 inflamation was palpably decreased in area of complaint. Applied LLE compression wraps as establishe. Pt tolerated treatment without difficulty.    Occupational performance deficits (Please refer to evaluation for details):  ADL's;IADL's;Rest and Sleep;Work;Leisure;Social Participation;Other body image    Rehab Potential  Good    OT Frequency  2x / week 3 month F/U and PRN    OT Duration  12 weeks    Clinical Decision Making  Several treatment options, min-mod task modification necessary    Consulted and Agree with Plan of Care  Patient;Family member/caregiver       Patient will benefit from skilled therapeutic intervention in order to improve the following deficits and impairments:  Abnormal gait, Decreased endurance, Decreased skin integrity,  Decreased knowledge of precautions, Decreased scar mobility, Decreased knowledge of use of DME, Impaired flexibility, Decreased balance, Decreased mobility, Difficulty walking, Decreased coordination  Visit Diagnosis: Lymphedema, not elsewhere classified    Problem List Patient Active Problem List   Diagnosis Date Noted  . Mitral regurgitation 12/30/2017  . Sinus tachycardia 11/29/2017  . Episodic tension-type headache, not intractable 07/22/2015  . Hydrocephalus, adult 09/10/2014  . Glaucoma 08/22/2014  . Preventative health care 05/18/2011  . Lymphedema 08/06/2006    Andrey Spearman, MS, OTR/L, [facility] 01/10/18 11:56 AM  [place] MAIN [facility] SERVICES [address] [place], [place], [address] Phone: [phone]   Fax:  [phone]  Name: Alison Fox MRN: [id number] Date of Birth: 02/23/1971

## 2018-01-12 ENCOUNTER — Ambulatory Visit: Payer: Medicare HMO | Admitting: Occupational Therapy

## 2018-01-12 ENCOUNTER — Other Ambulatory Visit: Payer: Self-pay

## 2018-01-12 DIAGNOSIS — I890 Lymphedema, not elsewhere classified: Secondary | ICD-10-CM

## 2018-01-12 MED ORDER — NORTRIPTYLINE HCL 50 MG PO CAPS: 100.0000 mg | ORAL_CAPSULE | Freq: Every day | ORAL | 2 refills | Status: AC

## 2018-01-12 NOTE — Therapy (Signed)
[place] MAIN [facility] SERVICES [address] [place], [place], [address] Phone: [phone]   Fax:  [phone]  Occupational Therapy Treatment  Patient Details  Name: Alison Fox MRN: [id number] Date of Birth: 01-04-1971 No data recorded  Encounter Date: 01/12/2018  OT End of Session - 01/12/18 1200    Visit Number  9    Number of Visits  36    Date for OT Re-Evaluation  05/11/17    Authorization Type  28 for 2018    OT Start Time  1055    OT Stop Time  1202    OT Time Calculation (min)  67 min    Activity Tolerance  Patient tolerated treatment well;No increased pain    Behavior During Therapy  WFL for tasks assessed/performed       Past Medical History:  Diagnosis Date  . 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 Glaucoma 08/22/2014  . Headache   . 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 On 01/21/15, she saw Dr. Kathyrn Sheriff at [facility].  She had a repeat MRI of the brain without contrast which demonstrated symmetric ventriculomegaly of the lateral ventricles and third ventricle without significant transependymal flow.  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  Endoscopic third ventriculostomy was offered but not insisted since her only symptoms are headache, which is now mild.  She did not wish to proceed with this procedure.  . Lymphedema    Chronic following left knee surgery in 2000.  . Varicose veins     Past Surgical History:  Procedure Laterality Date  . EYE SURGERY    . KNEE ARTHROSCOPY  2000   Left knee.     There were no vitals filed for this visit.  Subjective Assessment - 01/12/18 1059    Subjective   Pt presents for OT visit 9/36  for 2019 for CDT to LLE for LE exacerbation.  Pt presents with LLE compression wraps from toes to groin  in place and RLE knee length compression stocking in place. P\ c/o soreness in RLE again today, but "not as bad as it was." Pt opted for ther ex at beginning of session   vs after today.    Patient is accompained by:  Family member    Pertinent History  onset in 21s and positive family history for maternal grandmother and cousin suggests primary LE Tarda; arthroscopic knee sx ~ 2000?, recent fall w/ head laceration. 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    Limitations  difficulty walking, decreased balance, suspected BLE suspected, R>L,      Special Tests  get the swelling down and keep it down    Patient Stated Goals  replace my stockings and get the swelling down again.    Currently in Pain?  Yes    Pain Location  Leg    Pain Orientation  Right    Pain Descriptors / Indicators  Sore    Pain Type  Chronic pain    Pain Onset  More than a month ago chronic    Pain Frequency  Intermittent    Aggravating Factors   standing, walking, dependent positioning  OT Treatments/Exercises (OP) - 01/12/18 0001      ADLs   ADL Education Given  Yes      Manual Therapy   Manual Therapy  Edema management;Compression Bandaging;Manual Lymphatic Drainage (MLD)    Edema Management  Ther Ex on recumbent bike (NuStep) for 10 minutes at moderate pace and no resistance  to facilitate increased lymphatic pumping    Manual Lymphatic Drainage (MLD)  Manual lymph drainage (MLD) to RLE in supine utilizing functional inguinal lymph nodes and deep abdominal lymphatics as is customary for non-cancer related lower extremity LE, including bilateral "short neck" sequence, J strokes to sub and supraclavicular LN, deep abdominal pathways, functional inguinal LN, lower extremity proximal to distal w/ emphasis on medial  knee bottleneck and politeal LN. Performed fibrosis technique to B maleoli and distal  legs to address fatty fibrosis. Good tolerance.    Compression Bandaging  LLE wrpas: 2 layer gradient wrap over Rosidal foam for light supportive  compression to knee.             OT Education - 01/12/18 1058    Education provided  Yes    Education Details  Continued skilled Pt/caregiver education  And LE ADL training throughout visit for lymphedema self care/ home program, including compression wrapping, compression garment and device wear/care, lymphatic pumping ther ex, simple self-MLD, and skin care. Discussed progress towards goals.     Person(s) Educated  Patient;Parent(s)    Methods  Explanation;Demonstration    Comprehension  Verbalized understanding;Returned demonstration       OT Short Term Goals - 09/01/16 1356      OT SHORT TERM GOAL #1   Title  Lymphedema (LE) management/ self-care: Pt able to apply multi layered, gradient compression wraps with MAX caregiver assistance using proper techniques within 2 weeks to achieve optimal limb volume reduction.    Baseline  max caregiver assist; caregiver min assist    Time  2    Period  Weeks    Status  New      OT SHORT TERM GOAL #2   Title  Lymphedema (LE) management/ self-care:  Pt to achieve at least 10% LLE limb volume reductions bilaterally during Intensive CDT to limit LE progression, decrease infection and falls risk, to reduce pain/, and to improve safe ambulation and functional mobility.    Baseline  max assist    Time  12    Period  Weeks    Status  New      OT SHORT TERM GOAL #3   Title  Lymphedema (LE) management/ self-care:  Pt >/= 85 % compliant with all daily, LE self-care protocols for home program w/ needed level of caregiver assistance , including simple self-manual lymphatic drainage (MLD), skin care, lymphatic pumping the ex, skin care, and donning/ doffing compression wraps and garments o limit LE progression and further  functional decline.      Baseline  Max A    Time  12    Period  Weeks    Status  New      OT SHORT TERM GOAL #4   Title  Lymphedema (LE) management/ self-care:  Pt to tolerate daily compression wraps, garments and devices in keeping w/ prescribed wear regime within 1 week of issue date to progress and retain clinical and functional gains and to limit LE progression.    Baseline  mod A    Time  12    Period  Weeks    Status  New  OT SHORT TERM GOAL #5   Title  Lymphedema (LE) self-care:  During Management Phase CDT Pt to sustain limb volume reductions achieved during Intensive Phase CDT within 5% utilizing LE self-care protocols, appropriate compression garments/ devices, and needed level of caregiver assistance.    Baseline  Max A    Time  6    Period  Months    Status  New        OT Long Term Goals - 12/07/17 1629      OT LONG TERM GOAL #2   Title  Pt to achieve 15% limb volume reduction in LLE, and 10% reduction in RLE by DC to limit lymphedema (LE) progression and infection risk.    Baseline  dependent    Time  12    Period  Weeks    Status  New    Target Date  03/07/18      OT LONG TERM GOAL #3   Title  Pt to tolerate daily, thigh length ,gradient compression wrapping to thigh, one leg at a time , within one week of recommencing OT for CDT in effort to decrease BLE limb volume for improved ambulation and functional mobility, and to decrease infection risk and LE progression.    Baseline  dependent    Time  1    Period  Weeks    Status  New    Target Date  -- 2nd OT rx visit      OT LONG TERM GOAL #5   Title  Pt to remain infection free throughout CDT course to limit infection and LE progression.     Baseline  dependent    Time  12    Period  Weeks    Status  New      OT LONG TERM GOAL #6   Title  During Management Phase CDT Pt to sustain limb volume reductions achieved during Intensive Phase CDT within 5% utilizing LE self-care protocols, appropriate  compression garments/ devices, and moderate caregiver assistance.    Baseline  dependent    Time  6    Period  Months    Status  Partially Met            Plan - 01/12/18 1202    Clinical Impression Statement  Pt continues to demonstrate progress towards all OT goals. Leg continues to reduce and tissue density is less dense. Pt enoys ther ex using recumbent bike. Pt worked on NuStep x 10 minutes w/out a rest break to facilitate increased lymphatic pumping. Pt tolerated MLD to LLE today without difficulty. NO MLD to RLE today as Pt reporting less pain. Applied compression wraps foot to groin as established. Complete limb volumetrics next visit.     Occupational performance deficits (Please refer to evaluation for details):  ADL's;IADL's;Rest and Sleep;Work;Leisure;Social Participation;Other body image    Rehab Potential  Good    OT Frequency  2x / week 3 month F/U and PRN    OT Duration  12 weeks    Clinical Decision Making  Several treatment options, min-mod task modification necessary    Consulted and Agree with Plan of Care  Patient;Family member/caregiver       Patient will benefit from skilled therapeutic intervention in order to improve the following deficits and impairments:  Abnormal gait, Decreased endurance, Decreased skin integrity, Decreased knowledge of precautions, Decreased scar mobility, Decreased knowledge of use of DME, Impaired flexibility, Decreased balance, Decreased mobility, Difficulty walking, Decreased coordination  Visit Diagnosis: Lymphedema, not elsewhere classified  Problem List Patient Active Problem List   Diagnosis Date Noted  . Mitral regurgitation 12/30/2017  . Sinus tachycardia 11/29/2017  . Episodic tension-type headache, not intractable 07/22/2015  . Hydrocephalus, adult 09/10/2014  . Glaucoma 08/22/2014  . Preventative health care 05/18/2011  . Lymphedema 08/06/2006    Andrey Spearman, MS, OTR/L, CLT-LANA 01/12/18 12:12 PM  [place] MAIN [facility] SERVICES [address] [place], [place], [address] Phone: [phone]   Fax:  [phone]  Name: Alison Fox MRN: [id number] Date of Birth: [birth year]

## 2018-01-17 ENCOUNTER — Ambulatory Visit
Payer: Medicare HMO | Attending: Student in an Organized Health Care Education/Training Program | Admitting: Occupational Therapy

## 2018-01-17 DIAGNOSIS — I890 Lymphedema, not elsewhere classified: Secondary | ICD-10-CM | POA: Diagnosis not present

## 2018-01-17 NOTE — Therapy (Signed)
[place] MAIN [facility] SERVICES [address] [place], [place], [address] Phone: [phone]   Fax:  [phone]  Occupational Therapy Treatment Note and Progress Report:  Lymphedema Care  Patient Details  Name: Alison Fox MRN: [id number] Date of Birth: 1971-04-08 No data recorded  Encounter Date: 01/17/2018  OT End of Session - 01/17/18 0901    Visit Number  10    Number of Visits  36    Date for OT Re-Evaluation  05/11/17    Authorization Type  28 for 2018    OT Start Time  0800    OT Stop Time  0854    OT Time Calculation (min)  54 min    Activity Tolerance  Patient tolerated treatment well;No increased pain    Behavior During Therapy  WFL for tasks assessed/performed       Past Medical History:  Diagnosis Date  . 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 Glaucoma 08/22/2014  . Headache   . 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 On 01/21/15, she saw Dr. Kathyrn Sheriff at [facility].  She had a repeat MRI of the brain without contrast which demonstrated symmetric ventriculomegaly of the lateral ventricles and third ventricle without significant transependymal flow.  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  Endoscopic third ventriculostomy was offered but not insisted since her only symptoms are headache, which is now mild.  She did not wish to proceed with this procedure.  . Lymphedema    Chronic following left knee surgery in 2000.  . Varicose veins     Past Surgical History:  Procedure Laterality Date  . EYE SURGERY    . KNEE ARTHROSCOPY  2000   Left knee.     There were no vitals filed for this visit.  Subjective Assessment - 01/17/18 0810    Subjective   Pt presents for OT visit 10/36  for 2019 for CDT to LLE for LE exacerbation.  Pt presents  with LLE compression wraps from toes to groin in place and RLE knee length compression stocking in place. Pt reorts, I used my machine some this weekend. " Pt has no new complaints.    Patient is accompained by:  Family member    Pertinent History  onset in 80s and positive family history for maternal grandmother and cousin suggests primary LE Tarda; arthroscopic knee sx ~ 2000?, recent fall w/ head laceration. 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    Limitations  difficulty walking, decreased balance, suspected BLE suspected, R>L,      Special Tests  get the swelling down and keep it down    Patient Stated Goals  replace my stockings and get the swelling down again.    Currently in Pain?  No/denies    Pain Onset  More than a month ago chronic                   OT Treatments/Exercises (OP) - 01/17/18 0001      ADLs   ADL Education Given  Yes      Manual Therapy   Manual Therapy  Edema management;Compression Bandaging;Manual Lymphatic Drainage (MLD)    Edema Management  no Ther ex today. NuStep unavailable    Manual Lymphatic Drainage (MLD)  Manual lymph drainage (MLD) to RLE in supine utilizing functional inguinal lymph nodes and deep abdominal lymphatics as is customary for non-cancer related lower extremity LE, including bilateral "short neck" sequence, J strokes to sub and supraclavicular LN, deep abdominal pathways, functional inguinal LN, lower extremity proximal to distal w/ emphasis on medial knee bottleneck and politeal LN. Performed fibrosis technique to B maleoli and distal  legs to address fatty fibrosis. Good tolerance.    Compression Bandaging  LLE wrpas: 2 layer gradient wrap over Rosidal foam for light supportive  compression to knee.    Other Manual Therapy  skin care throughouty MLD              OT Education - 01/17/18 0901    Education provided  Yes    Education Details  LE self care    Person(s) Educated  Patient;Parent(s)    Methods  Explanation;Demonstration    Comprehension  Verbalized understanding;Returned demonstration       OT Short Term Goals - 09/01/16 1356      OT SHORT TERM GOAL #1   Title  Lymphedema (LE) management/ self-care: Pt able to apply multi layered, gradient compression wraps with MAX caregiver assistance using proper techniques within 2 weeks to achieve optimal limb volume reduction.    Baseline  max caregiver assist; caregiver min assist    Time  2    Period  Weeks    Status  New      OT SHORT TERM GOAL #2   Title  Lymphedema (LE) management/ self-care:  Pt to achieve at least 10% LLE limb volume reductions bilaterally during Intensive CDT to limit LE progression, decrease infection and falls risk, to reduce pain/, and to improve safe ambulation and functional mobility.    Baseline  max assist    Time  12    Period  Weeks    Status  New      OT SHORT TERM GOAL #3   Title  Lymphedema (LE) management/ self-care:  Pt >/= 85 % compliant with all daily, LE self-care protocols for home program w/ needed level of caregiver assistance , including simple self-manual lymphatic drainage (MLD), skin care, lymphatic pumping the ex, skin care, and donning/ doffing compression wraps and garments o limit LE progression and further functional decline.      Baseline  Max A    Time  12    Period  Weeks    Status  New      OT SHORT TERM GOAL #4   Title  Lymphedema (LE) management/ self-care:  Pt to tolerate daily compression wraps, garments and devices in keeping w/ prescribed wear regime within 1 week of issue date to progress and retain clinical and functional gains and to limit LE progression.    Baseline  mod A    Time  12    Period  Weeks    Status  New      OT SHORT TERM GOAL #5   Title  Lymphedema (LE) self-care:  During Management  Phase CDT Pt to sustain limb volume reductions achieved during Intensive Phase CDT within 5% utilizing LE self-care protocols, appropriate compression garments/ devices, and needed level of caregiver assistance.    Baseline  Max A    Time  6    Period  Months    Status  New        OT Long Term Goals -  01/17/18 0903      OT LONG TERM GOAL #2   Title  Pt to achieve 15% limb volume reduction in LLE, and 10% reduction in RLE by DC to limit lymphedema (LE) progression and infection risk.    Baseline  dependent    Time  12    Period  Weeks    Status  Partially Met      OT LONG TERM GOAL #3   Title  Pt to tolerate daily, thigh length ,gradient compression wrapping to thigh, one leg at a time , within one week of recommencing OT for CDT in effort to decrease BLE limb volume for improved ambulation and functional mobility, and to decrease infection risk and LE progression.    Baseline  dependent    Time  1    Period  Weeks    Status  Achieved      OT LONG TERM GOAL #5   Title  Pt to remain infection free throughout CDT course to limit infection and LE progression.     Baseline  dependent    Time  12    Period  Weeks    Status  Partially Met      OT LONG TERM GOAL #6   Title  During Management Phase CDT Pt to sustain limb volume reductions achieved during Intensive Phase CDT within 5% utilizing LE self-care protocols, appropriate compression garments/ devices, and moderate caregiver assistance.    Baseline  dependent    Time  6    Period  Months    Status  Partially Met            Plan - 01/17/18 1524    Clinical Impression Statement  Pt tolerated all aspects of CDT without difficulty today.n LLE continues to decrease in tissue density below the knee. Swelling/ limb volume reduction is appropaching clinical plateau. Pt has met volumetric reduction goal and is compliant with most home program protocols with max assist by her mother. See Long Term Goals section for updates on  progress. Cont as per POC. Pt encouraged to call Medicaid office to check status before we complete measurements for garment remakes.    Occupational performance deficits (Please refer to evaluation for details):  ADL's;IADL's;Rest and Sleep;Work;Leisure;Social Participation;Other body image    Rehab Potential  Good    OT Frequency  2x / week 3 month F/U and PRN    OT Duration  12 weeks    Clinical Decision Making  Several treatment options, min-mod task modification necessary    Consulted and Agree with Plan of Care  Patient;Family member/caregiver       Patient will benefit from skilled therapeutic intervention in order to improve the following deficits and impairments:  Abnormal gait, Decreased endurance, Decreased skin integrity, Decreased knowledge of precautions, Decreased scar mobility, Decreased knowledge of use of DME, Impaired flexibility, Decreased balance, Decreased mobility, Difficulty walking, Decreased coordination  Visit Diagnosis: Lymphedema, not elsewhere classified    Problem List Patient Active Problem List   Diagnosis Date Noted  . Mitral regurgitation 12/30/2017  . Sinus tachycardia 11/29/2017  . Episodic tension-type headache, not intractable 07/22/2015  . Hydrocephalus, adult 09/10/2014  . Glaucoma 08/22/2014  . Preventative health care 05/18/2011  . Lymphedema 08/06/2006    Andrey Spearman, MS, OTR/L, [facility] 01/17/18 3:29 PM  [place] MAIN [facility] SERVICES [address] [place], [place], [address] Phone: [phone]   Fax:  [phone]  Name: HAPPY BEGEMAN MRN: [id number] Date of  Birth: [birth year]

## 2018-01-19 ENCOUNTER — Ambulatory Visit: Payer: Medicare HMO | Admitting: Occupational Therapy

## 2018-01-19 DIAGNOSIS — I890 Lymphedema, not elsewhere classified: Secondary | ICD-10-CM | POA: Diagnosis not present

## 2018-01-19 NOTE — Therapy (Signed)
[place] MAIN [facility] SERVICES [address] [place], [place], [address] Phone: [phone]   Fax:  [phone]  Occupational Therapy Treatment  Patient Details  Name: Alison Fox MRN: [id number] Date of Birth: 07-02-1971 No data recorded  Encounter Date: 01/19/2018  OT End of Session - 01/19/18 1057    Visit Number  11    Number of Visits  36    Date for OT Re-Evaluation  05/11/17    Authorization Type  28 for 2018    OT Start Time  1050    OT Stop Time  0177    OT Time Calculation (min)  75 min    Activity Tolerance  Patient tolerated treatment well;No increased pain    Behavior During Therapy  WFL for tasks assessed/performed       Past Medical History:  Diagnosis Date  . 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 Glaucoma 08/22/2014  . Headache   . 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 On 01/21/15, she saw Dr. Kathyrn Sheriff at [facility].  She had a repeat MRI of the brain without contrast which demonstrated symmetric ventriculomegaly of the lateral ventricles and third ventricle without significant transependymal flow.  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  Endoscopic third ventriculostomy was offered but not insisted since her only symptoms are headache, which is now mild.  She did not wish to proceed with this procedure.  . Lymphedema    Chronic following left knee surgery in 2000.  . Varicose veins     Past Surgical History:  Procedure Laterality Date  . EYE SURGERY    . KNEE ARTHROSCOPY  2000   Left knee.     There were no vitals filed for this visit.  Subjective Assessment - 01/19/18 1053    Subjective   Pt presents for OT visit 11/36  for 2019 for CDT to LLE for LE exacerbation.  Pt presents with LLE compression wraps from toes to groin  in place and RLE knee length compression stocking in place. Pt forgot to bring existing compression garment for LLE. Pt reports she tried to call Medicaid but got no answer. She agrees to plan to call them on speakerphone at next OT visit.    Patient is accompained by:  Family member    Pertinent History  onset in 40s and positive family history for maternal grandmother and cousin suggests primary LE Tarda; arthroscopic knee sx ~ 2000?, recent fall w/ head laceration. 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    Limitations  difficulty walking, decreased balance, suspected BLE suspected, R>L,      Special Tests  get the swelling down and keep it down    Patient Stated Goals  replace my stockings and get the swelling down again.    Currently in Pain?  No/denies    Pain Onset  More than a month ago chronic                   OT Treatments/Exercises (OP) - 01/19/18 0001      ADLs   ADL Education Given  Yes      Manual Therapy   Manual Therapy  Edema management;Compression Bandaging;Manual Lymphatic Drainage (MLD)    Edema Management  Nustep 10 minutes     Manual Lymphatic Drainage (MLD)  Manual lymph drainage (MLD) to RLE in supine utilizing functional inguinal lymph nodes and deep abdominal lymphatics as is customary for non-cancer related lower extremity LE, including bilateral "short neck" sequence, J strokes to sub and supraclavicular LN, deep abdominal pathways, functional inguinal LN, lower extremity proximal to distal w/ emphasis on medial knee bottleneck and politeal LN. Performed fibrosis technique to B maleoli and distal  legs to address fatty fibrosis. Good tolerance.    Compression Bandaging  LLE wrpas: 2 layer gradient wrap over Rosidal foam for light supportive  compression to knee.    Other  Manual Therapy  skin care throughouty MLD             OT Education - 01/19/18 1056    Education provided  Yes    Education Details  Continued skilled Pt/caregiver education  And LE ADL training throughout visit for lymphedema self care/ home program, including compression wrapping, compression garment and device wear/care, lymphatic pumping ther ex, simple self-MLD, and skin care. Discussed progress towards goals.     Person(s) Educated  Patient;Parent(s)    Methods  Explanation;Demonstration    Comprehension  Verbalized understanding;Returned demonstration       OT Short Term Goals - 09/01/16 1356      OT SHORT TERM GOAL #1   Title  Lymphedema (LE) management/ self-care: Pt able to apply multi layered, gradient compression wraps with MAX caregiver assistance using proper techniques within 2 weeks to achieve optimal limb volume reduction.    Baseline  max caregiver assist; caregiver min assist    Time  2    Period  Weeks    Status  New      OT SHORT TERM GOAL #2   Title  Lymphedema (LE) management/ self-care:  Pt to achieve at least 10% LLE limb volume reductions bilaterally during Intensive CDT to limit LE progression, decrease infection and falls risk, to reduce pain/, and to improve safe ambulation and functional mobility.    Baseline  max assist    Time  12    Period  Weeks    Status  New      OT SHORT TERM GOAL #3   Title  Lymphedema (LE) management/ self-care:  Pt >/= 85 % compliant with all daily, LE self-care protocols for home program w/ needed level of caregiver assistance , including simple self-manual lymphatic drainage (MLD), skin care, lymphatic pumping the ex, skin care, and donning/ doffing compression wraps and garments o limit LE progression and further functional decline.      Baseline  Max A    Time  12    Period  Weeks    Status  New      OT SHORT TERM GOAL #4   Title  Lymphedema (LE) management/ self-care:  Pt to tolerate daily compression wraps,  garments and devices in keeping w/ prescribed wear regime within 1 week of issue date to progress and retain clinical and functional gains and to limit LE progression.    Baseline  mod A    Time  12    Period  Weeks    Status  New      OT SHORT TERM GOAL #5   Title  Lymphedema (LE) self-care:  During Management Phase CDT Pt to sustain limb volume reductions achieved during Intensive Phase CDT within 5% utilizing LE self-care protocols, appropriate  compression garments/ devices, and needed level of caregiver assistance.    Baseline  Max A    Time  6    Period  Months    Status  New        OT Long Term Goals - 01/17/18 9470      OT LONG TERM GOAL #2   Title  Pt to achieve 15% limb volume reduction in LLE, and 10% reduction in RLE by DC to limit lymphedema (LE) progression and infection risk.    Baseline  dependent    Time  12    Period  Weeks    Status  Partially Met      OT LONG TERM GOAL #3   Title  Pt to tolerate daily, thigh length ,gradient compression wrapping to thigh, one leg at a time , within one week of recommencing OT for CDT in effort to decrease BLE limb volume for improved ambulation and functional mobility, and to decrease infection risk and LE progression.    Baseline  dependent    Time  1    Period  Weeks    Status  Achieved      OT LONG TERM GOAL #5   Title  Pt to remain infection free throughout CDT course to limit infection and LE progression.     Baseline  dependent    Time  12    Period  Weeks    Status  Partially Met      OT LONG TERM GOAL #6   Title  During Management Phase CDT Pt to sustain limb volume reductions achieved during Intensive Phase CDT within 5% utilizing LE self-care protocols, appropriate compression garments/ devices, and moderate caregiver assistance.    Baseline  dependent    Time  6    Period  Months    Status  Partially Met            Plan - 01/19/18 1154    Clinical Impression Statement  Pt approaching clinical plateau  for swelling reduction. Lymphedema under good control at this point. Pt benefitting frm ther ex in clinic to facilitate improved effect of MLD each visit. Pt tolerated manual modalities today and ther ex. Cont as per POC. Pt to bring existing compression garments next visit. Will complete volumetrics and fit garments and consider decreqasing OT frequency to transition to Management Phase CDT if volumetrics indicate stability.    Occupational performance deficits (Please refer to evaluation for details):  ADL's;IADL's;Rest and Sleep;Work;Leisure;Social Participation;Other body image    Rehab Potential  Good    OT Frequency  2x / week 3 month F/U and PRN    OT Duration  12 weeks    Clinical Decision Making  Several treatment options, min-mod task modification necessary    Consulted and Agree with Plan of Care  Patient;Family member/caregiver       Patient will benefit from skilled therapeutic intervention in order to improve the following deficits and impairments:  Abnormal gait, Decreased endurance, Decreased skin integrity, Decreased knowledge of precautions, Decreased scar mobility, Decreased knowledge of use of DME, Impaired flexibility, Decreased balance, Decreased mobility, Difficulty walking, Decreased coordination  Visit Diagnosis: Lymphedema, not elsewhere classified    Problem List Patient Active Problem List   Diagnosis Date Noted  . Mitral regurgitation 12/30/2017  . Sinus tachycardia 11/29/2017  . Episodic tension-type headache, not intractable 07/22/2015  . Hydrocephalus, adult 09/10/2014  . Glaucoma 08/22/2014  . Preventative health care 05/18/2011  . Lymphedema 08/06/2006    Clarene Critchley  Arsenio Loader, MS, OTR/L, CLT-LANA 01/19/18 12:25 PM  [place] MAIN [facility] SERVICES [address] [place], [place], [address] Phone: [phone]   Fax:  [phone]  Name: CHONTE RICKE MRN: [id number] Date of Birth: August 17, 1971

## 2018-01-24 ENCOUNTER — Ambulatory Visit: Payer: Medicare HMO | Admitting: Occupational Therapy

## 2018-01-26 ENCOUNTER — Ambulatory Visit: Payer: Medicare HMO | Admitting: Occupational Therapy

## 2018-01-26 DIAGNOSIS — I890 Lymphedema, not elsewhere classified: Secondary | ICD-10-CM

## 2018-01-26 NOTE — Therapy (Signed)
[place] MAIN [facility] SERVICES [address] [place], [place], [address] Phone: [phone]   Fax:  [phone]  Occupational Therapy Treatment  Patient Details  Name: Alison Fox MRN: [id number] Date of Birth: 06-Nov-1970 No data recorded  Encounter Date: 01/26/2018  OT End of Session - 01/26/18 0925    Visit Number  12    Number of Visits  36    Date for OT Re-Evaluation  05/11/17    Authorization Type  28 for 2018    OT Start Time  0800    OT Stop Time  0923    OT Time Calculation (min)  83 min    Activity Tolerance  Patient tolerated treatment well;No increased pain    Behavior During Therapy  WFL for tasks assessed/performed       Past Medical History:  Diagnosis Date  . 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 Glaucoma 08/22/2014  . Headache   . 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 On 01/21/15, she saw Dr. Kathyrn Sheriff at [facility].  She had a repeat MRI of the brain without contrast which demonstrated symmetric ventriculomegaly of the lateral ventricles and third ventricle without significant transependymal flow.  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  Endoscopic third ventriculostomy was offered but not insisted since her only symptoms are headache, which is now mild.  She did not wish to proceed with this procedure.  . Lymphedema    Chronic following left knee surgery in 2000.  . Varicose veins     Past Surgical History:  Procedure Laterality Date  . EYE SURGERY    . KNEE ARTHROSCOPY  2000   Left knee.     There were no vitals filed for this visit.  Subjective Assessment - 01/26/18 0901    Subjective   Pt presents for OT visit 12/36  for 2019 for CDT to LLE for LE exacerbation.  Pt presents with LLE compression wraps from toes to groin  in place and RLE knee length compression stocking in place. Pt brings existing , LLE compression thigh high to clinic for assessment.     Patient is accompained by:  Family member    Pertinent History  onset in 50s and positive family history for maternal grandmother and cousin suggests primary LE Tarda; arthroscopic knee sx ~ 2000?, recent fall w/ head laceration. 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    Limitations  difficulty walking, decreased balance, suspected BLE suspected, R>L,      Special Tests  get the swelling down and keep it down    Patient Stated Goals  replace my stockings and get the swelling down again.    Currently in Pain?  No/denies    Pain Onset  More than a month ago chronic          LYMPHEDEMA/ONCOLOGY QUESTIONNAIRE - 01/26/18 0903      Left Lower Extremity Lymphedema   Other  LLE ankle to knee (A-D) volume= 3977.19  ml. LLE knee-groin volume= 5673.50m. RLE full leg A-G volume =9650.18 ml.    Other  LLE A-D volume is decreased by 11.69% since last measured on 12/29/17, and by 25.12% overall since  recommencing CDT on 12/07/17. LLE thigh volume is decreased by 1.06% since 3/13, and by 3.61% overall. LLE full leg volume is decreased by 5.74% since 3/13, and by 13.82 overall since  12/07/17               OT Treatments/Exercises (OP) - 01/26/18 0001      ADLs   ADL Education Given  Yes      Manual Therapy   Manual Therapy  Edema management    Edema Management  Nustep 15 minutes - no resistance    Compression Bandaging  Existing ccl 3 custom Elvarex A-G - good condition. Resumed wear toDAY    Other Manual Therapy  LLE comparative limb volumetrics               OT Short Term Goals - 09/01/16 1356      OT SHORT TERM GOAL #1   Title  Lymphedema (LE) management/  self-care: Pt able to apply multi layered, gradient compression wraps with MAX caregiver assistance using proper techniques within 2 weeks to achieve optimal limb volume reduction.    Baseline  max caregiver assist; caregiver min assist    Time  2    Period  Weeks    Status  New      OT SHORT TERM GOAL #2   Title  Lymphedema (LE) management/ self-care:  Pt to achieve at least 10% LLE limb volume reductions bilaterally during Intensive CDT to limit LE progression, decrease infection and falls risk, to reduce pain/, and to improve safe ambulation and functional mobility.    Baseline  max assist    Time  12    Period  Weeks    Status  New      OT SHORT TERM GOAL #3   Title  Lymphedema (LE) management/ self-care:  Pt >/= 85 % compliant with all daily, LE self-care protocols for home program w/ needed level of caregiver assistance , including simple self-manual lymphatic drainage (MLD), skin care, lymphatic pumping the ex, skin care, and donning/ doffing compression wraps and garments o limit LE progression and further functional decline.      Baseline  Max A    Time  12    Period  Weeks    Status  New      OT SHORT TERM GOAL #4   Title  Lymphedema (LE) management/ self-care:  Pt to tolerate daily compression wraps, garments and devices in keeping w/ prescribed wear regime within 1 week of issue date to progress and retain clinical and functional gains and to limit LE progression.    Baseline  mod A    Time  12    Period  Weeks    Status  New      OT SHORT TERM GOAL #5   Title  Lymphedema (LE) self-care:  During Management Phase CDT Pt to sustain limb volume reductions achieved during Intensive Phase CDT within 5% utilizing LE self-care protocols, appropriate compression garments/ devices, and needed level of caregiver assistance.    Baseline  Max A    Time  6    Period  Months    Status  New        OT Long Term Goals - 01/26/18 0914      OT LONG TERM GOAL #1   Title  Pt able to  correctly apply gradient compression wraps to below knee with moderate caregiver assistance for optimal limb volume reduction and improvement in tissue integrity. 11/30/16: PT CURRENTLY REQUIRES  MAX a X1 TO DON COMPRESSION WRAPS.    Baseline  dependent     Time  2    Period  Weeks    Status  Achieved    Target Date  05/11/18      OT LONG TERM GOAL #2   Title  Pt to achieve 15% limb volume reduction in LLE, and 10% reduction in RLE by DC to limit lymphedema (LE) progression and infection risk.- NEW GOAL 25%. 11/30/16: PT HAS MET 10% VOLUMETRIC REDUCTION GOAL BILATERALLY , AND, TO DATE, LLE "BELOW THE KNEE" (A-D) VOLUMETRIC REDUCTION ACHIEVED MEASURES 20.76%.  LLE "TOES TO GROIN" (A-G) LIMB VOLUME REDUCTION MEASURES 15.67% SINCE INITIALLY MEASURED FOR EPISODE 2 ON 08/27/16.    Baseline  dependent Met 01/26/2018    Time  12    Period  Weeks    Status  Achieved    Target Date  05/11/18      OT LONG TERM GOAL #3   Title  Pt IS CONSISTENTLY 85 % compliant with all daily LE self-care protocols w/  MAXIMUM caregiver assistance, including simple self-manual lymphatic drainage (MLD), skin care, lymphatic pumping therex, and donning/ doffing progression garments.. 11/30/16: GOAL MODIFIED TO REFLECT MORE REALISTI, SUSTAINABLE, DEGREE OF SUCCESSFUL LE SELF MANAGEMENT OVER TIME. DECREASED CONSISTENT COMPLIANCE FROM 100% TO 85%, AND PT REQUIRES MAX ASSIST FROM CAREGIVER INSTEAD OF MODERATE ASSISTANCE.     Baseline  dependent 90% compliant on 01/26/18. Goal Met    Time  12    Period  Weeks    Status  Achieved    Target Date  05/11/18      OT LONG TERM GOAL #5   Title  Pt to remain infection free throughout CDT course to limit infection and LE progression.     Baseline  dependent    Time  12    Period  Weeks    Status  Achieved    Target Date  05/11/18      OT LONG TERM GOAL #6   Title  During Management Phase CDT Pt to sustain limb volume reductions achieved during Intensive Phase CDT within 5% utilizing  LE self-care protocols, appropriate compression garments/ devices, and moderate caregiver assistance.    Baseline  dependent    Time  6    Period  Months    Status  Achieved            Plan - 01/26/18 0907    Clinical Impression Statement  LLE comparative limb volumetrics reveal LLE A-D volume is decreased by 11.69% since last measured on 12/29/17, and by 25.12% overall since recommencing CDT on 12/07/17. LLE thigh volume is decreased by 1.06% since 3/13, and by 3.61% overall. LLE full leg volume is decreased by 5.74% since 3/13, and by 13.82 overall since  12/07/17 .  Pt demonstrates excellent progress towards all  OT goals for LE care. Volumetrics meet most recent reduction goal. Existing compression garment is in  good condition, but are showing normal signs of wear.  At present it appears to fit relatively well. OT assisted Pt w/ call to [place] [employer] and [place] DSS to get Medicaid reinstated so we can begin working towards garment replacement within the next couple of months. Pt very pleased with progress todate and fuly engaged in all aspects of OT today. Pt enjoyed riding the recumbent bike for 15 minutes without resistance at the end of session to facilitate ongoing weight loss and lymphatic pumping. Cont as per POC. Replace compression garments  ASAP. DC compression wraps next visit f swelling well controlled.    Occupational performance deficits (Please refer to evaluation for details):  ADL's;IADL's;Rest and Sleep;Work;Leisure;Social Participation;Other body image    Rehab Potential  Good    OT Frequency  2x / week 3 month F/U and PRN    OT Duration  12 weeks    Clinical Decision Making  Several treatment options, min-mod task modification necessary    Consulted and Agree with Plan of Care  Patient;Family member/caregiver       Patient will benefit from skilled therapeutic intervention in order to improve the following deficits and impairments:  Abnormal gait, Decreased endurance,  Decreased skin integrity, Decreased knowledge of precautions, Decreased scar mobility, Decreased knowledge of use of DME, Impaired flexibility, Decreased balance, Decreased mobility, Difficulty walking, Decreased coordination  Visit Diagnosis: Lymphedema    Problem List Patient Active Problem List   Diagnosis Date Noted  . Mitral regurgitation 12/30/2017  . Sinus tachycardia 11/29/2017  . Episodic tension-type headache, not intractable 07/22/2015  . Hydrocephalus, adult 09/10/2014  . Glaucoma 08/22/2014  . Preventative health care 05/18/2011  . Lymphedema 08/06/2006    Andrey Spearman, MS, OTR/L, [facility] 01/26/18 9:28 AM  [place] MAIN [facility] SERVICES [address] [place], [place], [address] Phone: [phone]   Fax:  [phone]  Name: Alison Fox MRN: [id number] Date of Birth: Feb 29, 1971

## 2018-01-31 ENCOUNTER — Ambulatory Visit: Payer: Medicare HMO | Admitting: Occupational Therapy

## 2018-01-31 DIAGNOSIS — I890 Lymphedema, not elsewhere classified: Secondary | ICD-10-CM

## 2018-01-31 NOTE — Therapy (Signed)
[place] MAIN [facility] SERVICES [address] [place], [place], [address] Phone: [phone]   Fax:  [phone]  Occupational Therapy Treatment  Patient Details  Name: Alison Fox MRN: [id number] Date of Birth: 12-25-1970 No data recorded  Encounter Date: 01/31/2018  OT End of Session - 01/31/18 1153    Visit Number  13    Number of Visits  36    Date for OT Re-Evaluation  05/11/17    Authorization Type  28 for 2018    OT Start Time  1050    OT Stop Time  7412    OT Time Calculation (min)  75 min    Activity Tolerance  Patient tolerated treatment well;No increased pain    Behavior During Therapy  WFL for tasks assessed/performed       Past Medical History:  Diagnosis Date  . 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 Glaucoma 08/22/2014  . Headache   . 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 On 01/21/15, she saw Dr. Kathyrn Sheriff at [facility].  She had a repeat MRI of the brain without contrast which demonstrated symmetric ventriculomegaly of the lateral ventricles and third ventricle without significant transependymal flow.  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  Endoscopic third ventriculostomy was offered but not insisted since her only symptoms are headache, which is now mild.  She did not wish to proceed with this procedure.  . Lymphedema    Chronic following left knee surgery in 2000.  . Varicose veins     Past Surgical History:  Procedure Laterality Date  . EYE SURGERY    . KNEE ARTHROSCOPY  2000   Left knee.     There were no vitals filed for this visit.  Subjective Assessment - 01/31/18 1058    Subjective   Pt presents for OT visit 13/36  for 2019 for CDT to LLE for LE exacerbation.  Pt presents with LLE compression wraps from toes to groin  in place  on LLE. Pt's mother reports that she received  Medicaid application in the mail . OT encouraged Pt to complete and return ASAP as Pt needs to access benefits for new compression garmnent replacements.    Patient is accompained by:  Family member    Pertinent History  onset in 24s and positive family history for maternal grandmother and cousin suggests primary LE Tarda; arthroscopic knee sx ~ 2000?, recent fall w/ head laceration. 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    Limitations  difficulty walking, decreased balance, suspected BLE suspected, R>L,      Special Tests  get the swelling down and keep it down    Patient Stated Goals  replace my stockings and get the swelling down again.    Currently in Pain?  No/denies    Pain Onset  More than a month ago chronic                           OT Education - 01/31/18 1152    Education provided  Yes    Education Details  Continued skilled Pt/caregiver education  And LE ADL training throughout visit  for lymphedema self care/ home program, including compression wrapping, compression garment and device wear/care, lymphatic pumping ther ex, simple self-MLD, and skin care. Discussed progress towards goals.     Person(s) Educated  Patient    Methods  Explanation;Demonstration    Comprehension  Verbalized understanding;Returned demonstration       OT Short Term Goals - 09/01/16 1356      OT SHORT TERM GOAL #1   Title  Lymphedema (LE) management/ self-care: Pt able to apply multi layered, gradient compression wraps with MAX caregiver assistance using proper techniques within 2 weeks to achieve optimal limb volume reduction.    Baseline  max caregiver assist; caregiver min assist    Time  2    Period  Weeks    Status  New      OT SHORT TERM  GOAL #2   Title  Lymphedema (LE) management/ self-care:  Pt to achieve at least 10% LLE limb volume reductions bilaterally during Intensive CDT to limit LE progression, decrease infection and falls risk, to reduce pain/, and to improve safe ambulation and functional mobility.    Baseline  max assist    Time  12    Period  Weeks    Status  New      OT SHORT TERM GOAL #3   Title  Lymphedema (LE) management/ self-care:  Pt >/= 85 % compliant with all daily, LE self-care protocols for home program w/ needed level of caregiver assistance , including simple self-manual lymphatic drainage (MLD), skin care, lymphatic pumping the ex, skin care, and donning/ doffing compression wraps and garments o limit LE progression and further functional decline.      Baseline  Max A    Time  12    Period  Weeks    Status  New      OT SHORT TERM GOAL #4   Title  Lymphedema (LE) management/ self-care:  Pt to tolerate daily compression wraps, garments and devices in keeping w/ prescribed wear regime within 1 week of issue date to progress and retain clinical and functional gains and to limit LE progression.    Baseline  mod A    Time  12    Period  Weeks    Status  New      OT SHORT TERM GOAL #5   Title  Lymphedema (LE) self-care:  During Management Phase CDT Pt to sustain limb volume reductions achieved during Intensive Phase CDT within 5% utilizing LE self-care protocols, appropriate compression garments/ devices, and needed level of caregiver assistance.    Baseline  Max A    Time  6    Period  Months    Status  New        OT Long Term Goals - 01/26/18 0914      OT LONG TERM GOAL #1   Title  Pt able to correctly apply gradient compression wraps to below knee with moderate caregiver assistance for optimal limb volume reduction and improvement in tissue integrity. 11/30/16: PT CURRENTLY REQUIRES MAX a X1 TO DON COMPRESSION WRAPS.    Baseline  dependent     Time  2    Period  Weeks    Status  Achieved     Target Date  05/11/18      OT LONG TERM GOAL #2   Title  Pt to achieve 15% limb volume reduction in LLE, and 10% reduction in RLE by DC to limit lymphedema (LE) progression and infection risk.- NEW GOAL 25%. 11/30/16:  PT HAS MET 10% VOLUMETRIC REDUCTION GOAL BILATERALLY , AND, TO DATE, LLE "BELOW THE KNEE" (A-D) VOLUMETRIC REDUCTION ACHIEVED MEASURES 20.76%.  LLE "TOES TO GROIN" (A-G) LIMB VOLUME REDUCTION MEASURES 15.67% SINCE INITIALLY MEASURED FOR EPISODE 2 ON 08/27/16.    Baseline  dependent Met 01/26/2018    Time  12    Period  Weeks    Status  Achieved    Target Date  05/11/18      OT LONG TERM GOAL #3   Title  Pt IS CONSISTENTLY 85 % compliant with all daily LE self-care protocols w/  MAXIMUM caregiver assistance, including simple self-manual lymphatic drainage (MLD), skin care, lymphatic pumping therex, and donning/ doffing progression garments.. 11/30/16: GOAL MODIFIED TO REFLECT MORE REALISTI, SUSTAINABLE, DEGREE OF SUCCESSFUL LE SELF MANAGEMENT OVER TIME. DECREASED CONSISTENT COMPLIANCE FROM 100% TO 85%, AND PT REQUIRES MAX ASSIST FROM CAREGIVER INSTEAD OF MODERATE ASSISTANCE.     Baseline  dependent 90% compliant on 01/26/18. Goal Met    Time  12    Period  Weeks    Status  Achieved    Target Date  05/11/18      OT LONG TERM GOAL #5   Title  Pt to remain infection free throughout CDT course to limit infection and LE progression.     Baseline  dependent    Time  12    Period  Weeks    Status  Achieved    Target Date  05/11/18      OT LONG TERM GOAL #6   Title  During Management Phase CDT Pt to sustain limb volume reductions achieved during Intensive Phase CDT within 5% utilizing LE self-care protocols, appropriate compression garments/ devices, and moderate caregiver assistance.    Baseline  dependent    Time  6    Period  Months    Status  Achieved            Plan - 01/31/18 1153    Clinical Impression Statement  Pt educated    throughout session with review of  daily LE self care routines at home, including garment wear, HOD device wear, lymphedema pump use on alternating legs and simple self -MLD. Provided daily  checklist chart to facilitate compliance. Pt tolerated LLE MLD without difficulty.  Distal leg is palpably slightly more swollen today than alast visit. Pt donned compression garments after manual therapy. Pt utilized NuStep recumbant bike  for therapeutic lymphatic pumping whle in compression garments. Secondary benefit of this activity is weight control. Pt agrees with plan to decrease OT for CDT  from 3 to 1 x weekly. We'll monitor volumetrics while reinforcing compoliance . Will replace /existing compression garments once medicaid  resources are back [place] place.    Occupational performance deficits (Please refer to evaluation for details):  ADL's;IADL's;Rest and Sleep;Work;Leisure;Social Participation;Other body image    Rehab Potential  Good    OT Frequency  2x / week 3 month F/U and PRN    OT Duration  12 weeks    Clinical Decision Making  Several treatment options, min-mod task modification necessary    Consulted and Agree with Plan of Care  Patient;Family member/caregiver       Patient will benefit from skilled therapeutic intervention in order to improve the following deficits and impairments:  Abnormal gait, Decreased endurance, Decreased skin integrity, Decreased knowledge of precautions, Decreased scar mobility, Decreased knowledge of use of DME, Impaired flexibility, Decreased balance, Decreased mobility, Difficulty walking, Decreased coordination  Visit Diagnosis: Lymphedema  Problem List Patient Active Problem List   Diagnosis Date Noted  . Mitral regurgitation 12/30/2017  . Sinus tachycardia 11/29/2017  . Episodic tension-type headache, not intractable 07/22/2015  . Hydrocephalus, adult 09/10/2014  . Glaucoma 08/22/2014  . Preventative health care 05/18/2011  . Lymphedema 08/06/2006    Andrey Spearman, MS, OTR/L,  [facility] 01/31/18 12:11 PM    [place] MAIN [facility] SERVICES [address] [place], [place], [address] Phone: [phone]   Fax:  [phone]  Name: Alison Fox MRN: [id number] Date of Birth: 1971/07/25

## 2018-02-02 ENCOUNTER — Ambulatory Visit: Payer: Medicare HMO | Admitting: Occupational Therapy

## 2018-02-07 ENCOUNTER — Ambulatory Visit: Payer: Medicare HMO | Admitting: Occupational Therapy

## 2018-02-07 DIAGNOSIS — I890 Lymphedema, not elsewhere classified: Secondary | ICD-10-CM

## 2018-02-08 NOTE — Therapy (Signed)
[place] MAIN [facility] SERVICES [address] [place], [place], [address] Phone: [phone]   Fax:  [phone]  Occupational Therapy Treatment  Patient Details  Name: Alison Fox MRN: [id number] Date of Birth: Mar 14, 1971 No data recorded  Encounter Date: 02/07/2018  OT End of Session - 02/07/18 1025    Visit Number  14    Number of Visits  36    Date for OT Re-Evaluation  05/11/17    Authorization Type  28 for 2018    OT Start Time  1100    OT Stop Time  1200    OT Time Calculation (min)  60 min    Activity Tolerance  Patient tolerated treatment well;No increased pain    Behavior During Therapy  WFL for tasks assessed/performed       Past Medical History:  Diagnosis Date  . 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 Glaucoma 08/22/2014  . Headache   . 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 On 01/21/15, she saw Dr. Kathyrn Sheriff at [facility].  She had a repeat MRI of the brain without contrast which demonstrated symmetric ventriculomegaly of the lateral ventricles and third ventricle without significant transependymal flow.  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  Endoscopic third ventriculostomy was offered but not insisted since her only symptoms are headache, which is now mild.  She did not wish to proceed with this procedure.  . Lymphedema    Chronic following left knee surgery in 2000.  . Varicose veins     Past Surgical History:  Procedure Laterality Date  . EYE SURGERY    . KNEE ARTHROSCOPY  2000   Left knee.     There were no vitals filed for this visit.  Subjective Assessment - 02/08/18 1026    Subjective   Pt presents for OT visit 14/36  for 2019 for CDT to LLE for LE exacerbation.  Pt presents w/ existing LLE , thigh high, custom  compression stocking in pllace, but sliding down from thigh to knee. Pt reports she feels embarrassed and upset because she tripped and fell walking in    hallway to clinic this morning. Pt denies injury.    Patient is accompained by:  Family member    Pertinent History  onset in 70s and positive family history for maternal grandmother and cousin suggests primary LE Tarda; arthroscopic knee sx ~ 2000?, recent fall w/ head laceration. 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    Limitations  difficulty walking, decreased balance, suspected BLE suspected, R>L,      Special Tests  get the swelling down and keep it down    Patient Stated Goals  replace my stockings and get the swelling down again.    Currently in Pain?  Yes not rated numerically    Pain Location  Leg    Pain Orientation  Right;Left    Pain Descriptors / Indicators  Discomfort;Other (Comment);Tightness;Heaviness;Tiring fullness    Pain Onset  More than a month ago chronic    Pain Frequency  Intermittent          LYMPHEDEMA/ONCOLOGY QUESTIONNAIRE - 02/08/18 1029      Left Lower Extremity  Lymphedema   Other  LLE ankle to knee (A-D) volume= 4832.47  ml. LLE knee-groin volume= 5408.42 ml. RLE full leg A-G volume =10240.89 ml.    Other  LLE comparative limb volumetrics repeated today due to obvious increase in LLE swelling compared with last visit. LLE A-D volume is  dramatically INcreased by 21.5% since last measured on 01/26/18.  LLE thigh volume is decreased by 4.67% since 01/26/18, , and LLE full leg is INcreased by 6.12% since last measured on 01/26/18.              OT Treatments/Exercises (OP) - 02/08/18 0001      ADLs   ADL Education Given  Yes      Manual Therapy   Manual Therapy  Edema management    Other Manual Therapy  LLE  comparative limb volumetrics             OT Education - 02/07/18 1053    Education provided  Yes    Education Details  Reviewed garment wear and care regimes, including replacement  q 3-6 month schedule.    Person(s) Educated  Patient;Parent(s)    Methods  Explanation;Demonstration    Comprehension  Verbalized understanding;Returned demonstration;Need further instruction;Verbal cues required       OT Short Term Goals - 09/01/16 1356      OT SHORT TERM GOAL #1   Title  Lymphedema (LE) management/ self-care: Pt able to apply multi layered, gradient compression wraps with MAX caregiver assistance using proper techniques within 2 weeks to achieve optimal limb volume reduction.    Baseline  max caregiver assist; caregiver min assist    Time  2    Period  Weeks    Status  New      OT SHORT TERM GOAL #2   Title  Lymphedema (LE) management/ self-care:  Pt to achieve at least 10% LLE limb volume reductions bilaterally during Intensive CDT to limit LE progression, decrease infection and falls risk, to reduce pain/, and to improve safe ambulation and functional mobility.    Baseline  max assist    Time  12    Period  Weeks    Status  New      OT SHORT TERM GOAL #3   Title  Lymphedema (LE) management/ self-care:  Pt >/= 85 % compliant with all daily, LE self-care protocols for home program w/ needed level of caregiver assistance , including simple self-manual lymphatic drainage (MLD), skin care, lymphatic pumping the ex, skin care, and donning/ doffing compression wraps and garments o limit LE progression and further functional decline.      Baseline  Max A    Time  12    Period  Weeks    Status  New      OT SHORT TERM GOAL #4   Title  Lymphedema (LE) management/ self-care:  Pt to tolerate daily compression wraps, garments and devices in keeping w/ prescribed wear regime within 1 week of issue date to progress and retain clinical and functional gains and to limit LE progression.     Baseline  mod A    Time  12    Period  Weeks    Status  New      OT SHORT TERM GOAL #5   Title  Lymphedema (LE) self-care:  During Management Phase CDT Pt to sustain limb volume reductions achieved during Intensive Phase CDT within 5% utilizing LE self-care protocols, appropriate compression garments/ devices, and needed level of caregiver assistance.  Baseline  Max A    Time  6    Period  Months    Status  New        OT Long Term Goals - 01/26/18 0914      OT LONG TERM GOAL #1   Title  Pt able to correctly apply gradient compression wraps to below knee with moderate caregiver assistance for optimal limb volume reduction and improvement in tissue integrity. 11/30/16: PT CURRENTLY REQUIRES MAX a X1 TO DON COMPRESSION WRAPS.    Baseline  dependent     Time  2    Period  Weeks    Status  Achieved    Target Date  05/11/18      OT LONG TERM GOAL #2   Title  Pt to achieve 15% limb volume reduction in LLE, and 10% reduction in RLE by DC to limit lymphedema (LE) progression and infection risk.- NEW GOAL 25%. 11/30/16: PT HAS MET 10% VOLUMETRIC REDUCTION GOAL BILATERALLY , AND, TO DATE, LLE "BELOW THE KNEE" (A-D) VOLUMETRIC REDUCTION ACHIEVED MEASURES 20.76%.  LLE "TOES TO GROIN" (A-G) LIMB VOLUME REDUCTION MEASURES 15.67% SINCE INITIALLY MEASURED FOR EPISODE 2 ON 08/27/16.    Baseline  dependent Met 01/26/2018    Time  12    Period  Weeks    Status  Achieved    Target Date  05/11/18      OT LONG TERM GOAL #3   Title  Pt IS CONSISTENTLY 85 % compliant with all daily LE self-care protocols w/  MAXIMUM caregiver assistance, including simple self-manual lymphatic drainage (MLD), skin care, lymphatic pumping therex, and donning/ doffing progression garments.. 11/30/16: GOAL MODIFIED TO REFLECT MORE REALISTI, SUSTAINABLE, DEGREE OF SUCCESSFUL LE SELF MANAGEMENT OVER TIME. DECREASED CONSISTENT COMPLIANCE FROM 100% TO 85%, AND PT REQUIRES MAX ASSIST FROM CAREGIVER INSTEAD OF MODERATE ASSISTANCE.      Baseline  dependent 90% compliant on 01/26/18. Goal Met    Time  12    Period  Weeks    Status  Achieved    Target Date  05/11/18      OT LONG TERM GOAL #5   Title  Pt to remain infection free throughout CDT course to limit infection and LE progression.     Baseline  dependent    Time  12    Period  Weeks    Status  Achieved    Target Date  05/11/18      OT LONG TERM GOAL #6   Title  During Management Phase CDT Pt to sustain limb volume reductions achieved during Intensive Phase CDT within 5% utilizing LE self-care protocols, appropriate compression garments/ devices, and moderate caregiver assistance.    Baseline  dependent    Time  6    Period  Months    Status  Achieved            Plan - 02/07/18 1055    Clinical Impression Statement  LLE presents with obvious increase in swelling below the knee since last visit. LLE comparative limb volumetrics reveal a dramatic increase in LLE limb volume below the knee. LLE comparative limb volumetrics repeated today due to obvious increase in LLE swelling compared with last visit. LLE A-D volume is  dramatically INcreased by 21.5% since last measured on 01/26/18.  LLE thigh volume is decreased by 4.67% since 01/26/18, , and LLE full leg is INcreased by 6.12% since last measured on 01/26/18. Existing compression thigh high worn daily since last visit when Pt resumed self- management  phase of SDT is falling down from top edge to distal thigh. Pt reorts stocking typically satys in place, but fell down after a fall on the way to OT this morning. Pt and mother re-educated re compression wear and care regimes, including q 3-6 month replacement schedule. Pt will resume compression wraps 23/7 with daily careful skin care and use of Flexitouch  in an effort to reduce and control swelling. Pt will  launder all existing compression garments and bring them to clinic next session for assessment. Plan to fit Pt with replacement garments ASAP, but her Medicaid  access is no longer in place, so finances have become a obstacle. Pt and parent agree with Plan.    Occupational performance deficits (Please refer to evaluation for details):  ADL's;IADL's;Rest and Sleep;Work;Leisure;Social Participation;Other body image    Rehab Potential  Good    OT Frequency  2x / week 3 month F/U and PRN    OT Duration  12 weeks    Clinical Decision Making  Several treatment options, min-mod task modification necessary    Consulted and Agree with Plan of Care  Patient;Family member/caregiver       Patient will benefit from skilled therapeutic intervention in order to improve the following deficits and impairments:  Abnormal gait, Decreased endurance, Decreased skin integrity, Decreased knowledge of precautions, Decreased scar mobility, Decreased knowledge of use of DME, Impaired flexibility, Decreased balance, Decreased mobility, Difficulty walking, Decreased coordination  Visit Diagnosis: Lymphedema, not elsewhere classified    Problem List Patient Active Problem List   Diagnosis Date Noted  . Mitral regurgitation 12/30/2017  . Sinus tachycardia 11/29/2017  . Episodic tension-type headache, not intractable 07/22/2015  . Hydrocephalus, adult 09/10/2014  . Glaucoma 08/22/2014  . Preventative health care 05/18/2011  . Lymphedema 08/06/2006    Andrey Spearman, MS, OTR/L, [facility] 02/08/18 11:01 AM  [place] MAIN [facility] SERVICES [address] [place], [place], [address] Phone: [phone]   Fax:  [phone]  Name: DORY DEMONT MRN: [id number] Date of Birth: 1971/06/06

## 2018-02-08 NOTE — Patient Instructions (Signed)
Unless you notice signs / symptoms of cellulitis infection or a blood clot, Resume compression wraps to LLE 23/ 7 , including HOS, until next visit in 1 week.Utilize sequential pneumatic compression device daily on LLE and rewrap after cleaning skin. Locate additional existing compression garments and bring to clinic for assessment next visit.

## 2018-02-09 ENCOUNTER — Ambulatory Visit: Payer: Medicare HMO | Admitting: Occupational Therapy

## 2018-02-10 ENCOUNTER — Encounter: Payer: Self-pay | Admitting: Neurology

## 2018-02-14 ENCOUNTER — Ambulatory Visit: Payer: Medicare HMO | Admitting: Occupational Therapy

## 2018-02-14 DIAGNOSIS — I890 Lymphedema, not elsewhere classified: Secondary | ICD-10-CM | POA: Diagnosis not present

## 2018-02-15 NOTE — Therapy (Signed)
[place] MAIN [facility] SERVICES [address] [place], [place], [address] Phone: [phone]   Fax:  [phone]  Occupational Therapy Treatment  Patient Details  Name: Alison Fox MRN: [id number] Date of Birth: 1970-12-28 No data recorded  Encounter Date: 02/14/2018  OT End of Session - 02/14/18 1643    Visit Number  15    Number of Visits  36    Date for OT Re-Evaluation  05/11/17    Authorization Type  28 for 2018    OT Start Time  1110    OT Stop Time  1220    OT Time Calculation (min)  70 min    Activity Tolerance  Patient tolerated treatment well;No increased pain    Behavior During Therapy  WFL for tasks assessed/performed       Past Medical History:  Diagnosis Date  . 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 Glaucoma 08/22/2014  . Headache   . 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 On 01/21/15, she saw Dr. Kathyrn Sheriff at [facility].  She had a repeat MRI of the brain without contrast which demonstrated symmetric ventriculomegaly of the lateral ventricles and third ventricle without significant transependymal flow.  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  Endoscopic third ventriculostomy was offered but not insisted since her only symptoms are headache, which is now mild.  She did not wish to proceed with this procedure.  . Lymphedema    Chronic following left knee surgery in 2000.  . Varicose veins     Past Surgical History:  Procedure Laterality Date  . EYE SURGERY    . KNEE ARTHROSCOPY  2000   Left knee.     There were no vitals filed for this visit.  Subjective Assessment - 02/14/18 1634    Subjective   Pt presents for OT visit 15/36  for 2019 for CDT to LLE for LE exacerbation.  Pt presents w/ compression wraps in place. Pt brings 2  existing thigh length compression garments to clinic for comparison and assessment.     Patient is accompained by:  Family member    Pertinent History  onset in 96s and positive family history for maternal grandmother and cousin suggests primary LE Tarda; arthroscopic knee sx ~ 2000?, recent fall w/ head laceration. 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    Limitations  difficulty walking, decreased balance, suspected BLE suspected, R>L,      Special Tests  get the swelling down and keep it down    Patient Stated Goals  replace my stockings and get the swelling down again.    Currently in Pain?  No/denies    Pain Onset  More than a month ago chronic                   OT Treatments/Exercises (OP) - 02/15/18 0001      ADLs   ADL Education Given  Yes      Exercises   Exercises  -- lymphatic pumping ex on recumbent bike w/out resistance -1 m      Manual Therapy   Manual Therapy  Edema management;Compression Bandaging;Manual Lymphatic Drainage (MLD)    Manual  Lymphatic Drainage (MLD)  Manual lymph drainage (MLD) to RLE in supine utilizing functional inguinal lymph nodes and deep abdominal lymphatics as is customary for non-cancer related lower extremity LE, including bilateral "short neck" sequence, J strokes to sub and supraclavicular LN, deep abdominal pathways, functional inguinal LN, lower extremity proximal to distal w/ emphasis on medial knee bottleneck and politeal LN. Performed fibrosis technique to B maleoli and distal  legs to address fatty fibrosis. Good tolerance.    Compression Bandaging  Garment assessed last visit is left oover from older pair of larger sized garments than those last issued. Pt brough additional garment in that was issued more recently. New er garment is substantially small  in calf   section and is in less worn condition    Other Manual Therapy  skin care to LLE             OT Education - 02/15/18 1641    Education provided  Yes       OT Short Term Goals - 09/01/16 1356      OT SHORT TERM GOAL #1   Title  Lymphedema (LE) management/ self-care: Pt able to apply multi layered, gradient compression wraps with MAX caregiver assistance using proper techniques within 2 weeks to achieve optimal limb volume reduction.    Baseline  max caregiver assist; caregiver min assist    Time  2    Period  Weeks    Status  New      OT SHORT TERM GOAL #2   Title  Lymphedema (LE) management/ self-care:  Pt to achieve at least 10% LLE limb volume reductions bilaterally during Intensive CDT to limit LE progression, decrease infection and falls risk, to reduce pain/, and to improve safe ambulation and functional mobility.    Baseline  max assist    Time  12    Period  Weeks    Status  New      OT SHORT TERM GOAL #3   Title  Lymphedema (LE) management/ self-care:  Pt >/= 85 % compliant with all daily, LE self-care protocols for home program w/ needed level of caregiver assistance , including simple self-manual lymphatic drainage (MLD), skin care, lymphatic pumping the ex, skin care, and donning/ doffing compression wraps and garments o limit LE progression and further functional decline.      Baseline  Max A    Time  12    Period  Weeks    Status  New      OT SHORT TERM GOAL #4   Title  Lymphedema (LE) management/ self-care:  Pt to tolerate daily compression wraps, garments and devices in keeping w/ prescribed wear regime within 1 week of issue date to progress and retain clinical and functional gains and to limit LE progression.    Baseline  mod A    Time  12    Period  Weeks    Status  New      OT SHORT TERM GOAL #5   Title  Lymphedema (LE) self-care:  During Management Phase CDT Pt to sustain limb volume reductions achieved during Intensive Phase CDT within 5%  utilizing LE self-care protocols, appropriate compression garments/ devices, and needed level of caregiver assistance.    Baseline  Max A    Time  6    Period  Months    Status  New        OT Long Term Goals - 01/26/18 0914      OT LONG TERM GOAL #  1   Title  Pt able to correctly apply gradient compression wraps to below knee with moderate caregiver assistance for optimal limb volume reduction and improvement in tissue integrity. 11/30/16: PT CURRENTLY REQUIRES MAX a X1 TO DON COMPRESSION WRAPS.    Baseline  dependent     Time  2    Period  Weeks    Status  Achieved    Target Date  05/11/18      OT LONG TERM GOAL #2   Title  Pt to achieve 15% limb volume reduction in LLE, and 10% reduction in RLE by DC to limit lymphedema (LE) progression and infection risk.- NEW GOAL 25%. 11/30/16: PT HAS MET 10% VOLUMETRIC REDUCTION GOAL BILATERALLY , AND, TO DATE, LLE "BELOW THE KNEE" (A-D) VOLUMETRIC REDUCTION ACHIEVED MEASURES 20.76%.  LLE "TOES TO GROIN" (A-G) LIMB VOLUME REDUCTION MEASURES 15.67% SINCE INITIALLY MEASURED FOR EPISODE 2 ON 08/27/16.    Baseline  dependent Met 01/26/2018    Time  12    Period  Weeks    Status  Achieved    Target Date  05/11/18      OT LONG TERM GOAL #3   Title  Pt IS CONSISTENTLY 85 % compliant with all daily LE self-care protocols w/  MAXIMUM caregiver assistance, including simple self-manual lymphatic drainage (MLD), skin care, lymphatic pumping therex, and donning/ doffing progression garments.. 11/30/16: GOAL MODIFIED TO REFLECT MORE REALISTI, SUSTAINABLE, DEGREE OF SUCCESSFUL LE SELF MANAGEMENT OVER TIME. DECREASED CONSISTENT COMPLIANCE FROM 100% TO 85%, AND PT REQUIRES MAX ASSIST FROM CAREGIVER INSTEAD OF MODERATE ASSISTANCE.     Baseline  dependent 90% compliant on 01/26/18. Goal Met    Time  12    Period  Weeks    Status  Achieved    Target Date  05/11/18      OT LONG TERM GOAL #5   Title  Pt to remain infection free throughout CDT course to limit infection  and LE progression.     Baseline  dependent    Time  12    Period  Weeks    Status  Achieved    Target Date  05/11/18      OT LONG TERM GOAL #6   Title  During Management Phase CDT Pt to sustain limb volume reductions achieved during Intensive Phase CDT within 5% utilizing LE self-care protocols, appropriate compression garments/ devices, and moderate caregiver assistance.    Baseline  dependent    Time  6    Period  Months    Status  Achieved            Plan - 02/15/18 1658    Clinical Impression Statement  Pt resumed LLE compression wrapping after last session with max assistance at home by her mother. Limb swelling is dramatically decreased today. Proided MLD and skin care to LLE as established to facilitate improved lymphatic function and to limit swelling. Assessed older stocking applied last visit with newer m, smaller garment for comparison. Pt tught how to compare garments and mark them for date. Newer garment is in good condition provides much improved fit compared with older garment. Pt taught again how to don garments so that landmarks on garment match up with landmarks on limb for optimal effectivenes. Pt utilized recumbant bike for lymphatic pumping ther ex once garment donned. Pt will return in 1 week for follow up to check management with existing, smaller, newere garment providing better fit and containment.    Occupational performance deficits (Please refer to  evaluation for details):  ADL's;IADL's;Rest and Sleep;Work;Leisure;Social Participation;Other body image    Rehab Potential  Good    OT Frequency  2x / week 3 month F/U and PRN    OT Duration  12 weeks    Clinical Decision Making  Several treatment options, min-mod task modification necessary    Consulted and Agree with Plan of Care  Patient;Family member/caregiver       Patient will benefit from skilled therapeutic intervention in order to improve the following deficits and impairments:  Abnormal gait,  Decreased endurance, Decreased skin integrity, Decreased knowledge of precautions, Decreased scar mobility, Decreased knowledge of use of DME, Impaired flexibility, Decreased balance, Decreased mobility, Difficulty walking, Decreased coordination  Visit Diagnosis: Lymphedema, not elsewhere classified    Problem List Patient Active Problem List   Diagnosis Date Noted  . Mitral regurgitation 12/30/2017  . Sinus tachycardia 11/29/2017  . Episodic tension-type headache, not intractable 07/22/2015  . Hydrocephalus, adult 09/10/2014  . Glaucoma 08/22/2014  . Preventative health care 05/18/2011  . Lymphedema 08/06/2006    Andrey Spearman, MS, OTR/L, [facility] 02/15/18 5:04 PM  [place] MAIN [facility] SERVICES [address] [place], [place], [address] Phone: [phone]   Fax:  [phone]  Name: Alison Fox MRN: [id number] Date of Birth: 06/16/1971

## 2018-02-16 ENCOUNTER — Ambulatory Visit: Payer: Medicare HMO | Admitting: Occupational Therapy

## 2018-02-21 ENCOUNTER — Ambulatory Visit
Payer: Medicare HMO | Attending: Student in an Organized Health Care Education/Training Program | Admitting: Occupational Therapy

## 2018-02-21 DIAGNOSIS — I890 Lymphedema, not elsewhere classified: Secondary | ICD-10-CM | POA: Diagnosis not present

## 2018-02-21 NOTE — Therapy (Signed)
[place] MAIN [facility] SERVICES [address] [place], [place], [address] Phone: [phone]   Fax:  [phone]  Occupational Therapy Treatment  Patient Details  Name: Alison Fox MRN: [id number] Date of Birth: 06/04/1971 No data recorded  Encounter Date: 02/21/2018  OT End of Session - 02/21/18 1343    Visit Number  16    Number of Visits  36    Date for OT Re-Evaluation  05/11/17    Authorization Type  28 for 2018    OT Start Time  1110    OT Stop Time  1213    OT Time Calculation (min)  63 min    Activity Tolerance  Patient tolerated treatment well;No increased pain    Behavior During Therapy  WFL for tasks assessed/performed       Past Medical History:  Diagnosis Date  . 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 Glaucoma 08/22/2014  . Headache   . 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 On 01/21/15, she saw Dr. Kathyrn Sheriff at [facility].  She had a repeat MRI of the brain without contrast which demonstrated symmetric ventriculomegaly of the lateral ventricles and third ventricle without significant transependymal flow.  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  Endoscopic third ventriculostomy was offered but not insisted since her only symptoms are headache, which is now mild.  She did not wish to proceed with this procedure.  . Lymphedema    Chronic following left knee surgery in 2000.  . Varicose veins     Past Surgical History:  Procedure Laterality Date  . EYE SURGERY    . KNEE ARTHROSCOPY  2000   Left knee.     There were no vitals filed for this visit.  Subjective Assessment - 02/21/18 1119    Subjective   Pt presents for OT visit 16/36  for 2019 for CDT to LLE for LE exacerbation.  Pt presents w/ compression stockings in place. Pt has no  new complaints about leg swelling. She does report discomfort, or " soreness, heavy feeling" at L temple.    Patient is accompained by:  Family member    Pertinent History  onset in 44s and positive family history for maternal grandmother and cousin suggests primary LE Tarda; arthroscopic knee sx ~ 2000?, recent fall w/ head laceration. 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    Limitations  difficulty walking, decreased balance, suspected BLE suspected, R>L,      Special Tests  get the swelling down and keep it down    Patient Stated Goals  replace my stockings and get the swelling down again.    Currently in Pain?  Yes not rated    Pain Location  Head    Pain Orientation  Left    Pain Descriptors / Indicators  Headache    Pain Onset  1 to 4 weeks ago 3 weeks    Aggravating Factors   headache, rising in the morning    Pain Relieving Factors  rubbing it                   OT Treatments/Exercises (OP) - 02/21/18 0001  ADLs   ADL Education Given  Yes      Manual Therapy   Manual Therapy  Edema management;Manual Lymphatic Drainage (MLD);Other (comment)    Manual Lymphatic Drainage (MLD)  Manual lymph drainage (MLD) to RLE in supine utilizing functional inguinal lymph nodes and deep abdominal lymphatics as is customary for non-cancer related lower extremity LE, including bilateral "short neck" sequence, J strokes to sub and supraclavicular LN, deep abdominal pathways, functional inguinal LN, lower extremity proximal to distal w/ emphasis on medial knee bottleneck and politeal LN. Performed fibrosis technique to B maleoli and distal  legs to address fatty fibrosis. Good tolerance.    Compression Bandaging  garment assessment for function    Other Manual Therapy  skin care to LLE              OT Education - 02/21/18 1340    Education provided  Yes    Education Details  Reviewed each LE self care component and importance of each for optimal long term self management.    Person(s) Educated  Patient;Parent(s)    Methods  Explanation;Demonstration    Comprehension  Verbalized understanding;Returned demonstration       OT Short Term Goals - 09/01/16 1356      OT SHORT TERM GOAL #1   Title  Lymphedema (LE) management/ self-care: Pt able to apply multi layered, gradient compression wraps with MAX caregiver assistance using proper techniques within 2 weeks to achieve optimal limb volume reduction.    Baseline  max caregiver assist; caregiver min assist    Time  2    Period  Weeks    Status  New      OT SHORT TERM GOAL #2   Title  Lymphedema (LE) management/ self-care:  Pt to achieve at least 10% LLE limb volume reductions bilaterally during Intensive CDT to limit LE progression, decrease infection and falls risk, to reduce pain/, and to improve safe ambulation and functional mobility.    Baseline  max assist    Time  12    Period  Weeks    Status  New      OT SHORT TERM GOAL #3   Title  Lymphedema (LE) management/ self-care:  Pt >/= 85 % compliant with all daily, LE self-care protocols for home program w/ needed level of caregiver assistance , including simple self-manual lymphatic drainage (MLD), skin care, lymphatic pumping the ex, skin care, and donning/ doffing compression wraps and garments o limit LE progression and further functional decline.      Baseline  Max A    Time  12    Period  Weeks    Status  New      OT SHORT TERM GOAL #4   Title  Lymphedema (LE) management/ self-care:  Pt to tolerate daily compression wraps, garments and devices in keeping w/ prescribed wear regime within 1 week of issue date to progress and retain clinical and functional gains and to limit LE progression.    Baseline  mod A    Time  12    Period  Weeks    Status  New       OT SHORT TERM GOAL #5   Title  Lymphedema (LE) self-care:  During Management Phase CDT Pt to sustain limb volume reductions achieved during Intensive Phase CDT within 5% utilizing LE self-care protocols, appropriate compression garments/ devices, and needed level of caregiver assistance.    Baseline  Max A    Time  6  Period  Months    Status  New        OT Long Term Goals - 01/26/18 0914      OT LONG TERM GOAL #1   Title  Pt able to correctly apply gradient compression wraps to below knee with moderate caregiver assistance for optimal limb volume reduction and improvement in tissue integrity. 11/30/16: PT CURRENTLY REQUIRES MAX a X1 TO DON COMPRESSION WRAPS.    Baseline  dependent     Time  2    Period  Weeks    Status  Achieved    Target Date  05/11/18      OT LONG TERM GOAL #2   Title  Pt to achieve 15% limb volume reduction in LLE, and 10% reduction in RLE by DC to limit lymphedema (LE) progression and infection risk.- NEW GOAL 25%. 11/30/16: PT HAS MET 10% VOLUMETRIC REDUCTION GOAL BILATERALLY , AND, TO DATE, LLE "BELOW THE KNEE" (A-D) VOLUMETRIC REDUCTION ACHIEVED MEASURES 20.76%.  LLE "TOES TO GROIN" (A-G) LIMB VOLUME REDUCTION MEASURES 15.67% SINCE INITIALLY MEASURED FOR EPISODE 2 ON 08/27/16.    Baseline  dependent Met 01/26/2018    Time  12    Period  Weeks    Status  Achieved    Target Date  05/11/18      OT LONG TERM GOAL #3   Title  Pt IS CONSISTENTLY 85 % compliant with all daily LE self-care protocols w/  MAXIMUM caregiver assistance, including simple self-manual lymphatic drainage (MLD), skin care, lymphatic pumping therex, and donning/ doffing progression garments.. 11/30/16: GOAL MODIFIED TO REFLECT MORE REALISTI, SUSTAINABLE, DEGREE OF SUCCESSFUL LE SELF MANAGEMENT OVER TIME. DECREASED CONSISTENT COMPLIANCE FROM 100% TO 85%, AND PT REQUIRES MAX ASSIST FROM CAREGIVER INSTEAD OF MODERATE ASSISTANCE.     Baseline  dependent 90% compliant on 01/26/18. Goal Met    Time  12     Period  Weeks    Status  Achieved    Target Date  05/11/18      OT LONG TERM GOAL #5   Title  Pt to remain infection free throughout CDT course to limit infection and LE progression.     Baseline  dependent    Time  12    Period  Weeks    Status  Achieved    Target Date  05/11/18      OT LONG TERM GOAL #6   Title  During Management Phase CDT Pt to sustain limb volume reductions achieved during Intensive Phase CDT within 5% utilizing LE self-care protocols, appropriate compression garments/ devices, and moderate caregiver assistance.    Baseline  dependent    Time  6    Period  Months    Status  Achieved            Plan - 02/21/18 1344    Clinical Impression Statement  Pt presents to clinic with compression garment in place. She did not bring additional garment ordered last time as requested. Leg swelling appears well controlled since last visit. Skin below knees is well hydrated. Tissue is moderately fibrotic with moderate lyphatic congestion distally. Parent reports that Pt has been using compression device daily as directed. Pt tolerated LLE MLD, skin  care and lymphatic pumping ther ex without difficulties today. Pt agrees with plans to returj in 6 weekds for folllow up. Py directed to perform all LE self care compoonents daily, and call PRN w/ questions and / or concerns. Pt directed to discuss plan for replacement garment time  line with parents and discuss plan at next session. Cont as per POC.    Occupational performance deficits (Please refer to evaluation for details):  ADL's;IADL's;Rest and Sleep;Work;Leisure;Social Participation;Other body image    Rehab Potential  Good    OT Frequency  2x / week 3 month F/U and PRN    OT Duration  12 weeks    Clinical Decision Making  Several treatment options, min-mod task modification necessary    Consulted and Agree with Plan of Care  Patient;Family member/caregiver       Patient will benefit from skilled therapeutic  intervention in order to improve the following deficits and impairments:  Abnormal gait, Decreased endurance, Decreased skin integrity, Decreased knowledge of precautions, Decreased scar mobility, Decreased knowledge of use of DME, Impaired flexibility, Decreased balance, Decreased mobility, Difficulty walking, Decreased coordination  Visit Diagnosis: Lymphedema, not elsewhere classified    Problem List Patient Active Problem List   Diagnosis Date Noted  . Mitral regurgitation 12/30/2017  . Sinus tachycardia 11/29/2017  . Episodic tension-type headache, not intractable 07/22/2015  . Hydrocephalus, adult 09/10/2014  . Glaucoma 08/22/2014  . Preventative health care 05/18/2011  . Lymphedema 08/06/2006    Andrey Spearman, MS, OTR/L, [facility] 02/21/18 1:52 PM  [place] [facility] SERVICES [address] [place], [place], [address] Phone: [phone]   Fax:  [phone]  Name: Alison Fox MRN: [id number] Date of Birth: [birth year]

## 2018-02-23 ENCOUNTER — Ambulatory Visit: Payer: Medicare HMO | Admitting: Occupational Therapy

## 2018-02-28 ENCOUNTER — Encounter: Payer: Self-pay | Admitting: Occupational Therapy

## 2018-03-02 ENCOUNTER — Encounter: Payer: Self-pay | Admitting: Occupational Therapy

## 2018-03-09 ENCOUNTER — Ambulatory Visit (INDEPENDENT_AMBULATORY_CARE_PROVIDER_SITE_OTHER): Payer: Medicare HMO | Admitting: Internal Medicine

## 2018-03-09 ENCOUNTER — Other Ambulatory Visit: Payer: Self-pay

## 2018-03-09 VITALS — BP 124/72 | HR 97 | Temp 97.6°F | Ht 68.0 in | Wt 182.0 lb

## 2018-03-09 DIAGNOSIS — I83212 Varicose veins of right lower extremity with both ulcer of calf and inflammation: Secondary | ICD-10-CM | POA: Diagnosis not present

## 2018-03-09 DIAGNOSIS — L97219 Non-pressure chronic ulcer of right calf with unspecified severity: Secondary | ICD-10-CM | POA: Diagnosis not present

## 2018-03-09 DIAGNOSIS — L97218 Non-pressure chronic ulcer of right calf with other specified severity: Principal | ICD-10-CM

## 2018-03-09 DIAGNOSIS — I83012 Varicose veins of right lower extremity with ulcer of calf: Secondary | ICD-10-CM | POA: Insufficient documentation

## 2018-03-09 NOTE — Assessment & Plan Note (Addendum)
Pt has had swelling in right lower extremity for the last year.  She endorses a sore that developed over the last month that will not heal and states that it has been painful over the last week, she is not sure how it started.  She denies fever, chills, her vitals are stable today.  The venous stasis ulcer does not appear to be infected at this time.  She has good bloodflow to the area strong dp and pt pulses.  She sees physical therapy for lymphedema of the left leg, she was encouraged to have them assist with the right leg as well.  We asked our wound expert nurse sharon powers to take a look.    -Pt dressed with a silicone foam hydrogel dressing to be changed once weekly and given extra bandages -wound care consult placed -asked pt if she would like referral to podiatry for tinea in nail beds of ipsilateral foot but she declined

## 2018-03-09 NOTE — Progress Notes (Signed)
CC: Right leg pain  HPI:  [name] is a 47 y.o.  Female with PMH below here to address her right leg pain and swelling.  Please see A&P for status of the patient's chronic medical conditions  Past Medical History:  Diagnosis Date  . 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 Glaucoma 08/22/2014  . Headache   . 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 On 01/21/15, she saw Dr. Conchita Paris at [facility].  She had a repeat MRI of the brain without contrast which demonstrated symmetric ventriculomegaly of the lateral ventricles and third ventricle without significant transependymal flow.  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  Endoscopic third ventriculostomy was offered but not insisted since her only symptoms are headache, which is now mild.  She did not wish to proceed with this procedure.  . Lymphedema    Chronic following left knee surgery in 2000.  . Varicose veins    Review of Systems:  ROS: Pulmonary: pt denies increased work of breathing, shortness of breath,  Cardiac: pt denies palpitations, chest pain,  Abdominal: pt denies abdominal pain, nausea, vomiting, or diarrhea  Physical Exam:  Vitals:   03/09/18 1312  BP: 124/72  Pulse: 97  Temp: 97.6 F (36.4 C)  TempSrc: Oral  SpO2: 100%  Weight: 182 lb (82.6 kg)  Height:  (1.727 m)   Physical Exam  Constitutional: No distress.  Cardiovascular: Normal rate, regular rhythm and normal heart sounds. Exam reveals no gallop and no friction rub.  No murmur heard. Pulses:      Dorsalis pedis pulses are 3+ on the right side.       Posterior tibial pulses are 3+ on the right side.  Pulmonary/Chest: Effort normal and breath sounds normal. No respiratory distress. She has no wheezes. She has no rales. She exhibits no tenderness.    Abdominal: Soft. Bowel sounds are normal. She exhibits no distension and no mass. There is no tenderness. There is no rebound and no guarding.  Musculoskeletal: She exhibits edema (there is 1-2+ edema of the RLE) and tenderness (there is cirumferential soreness around a pressure ulcer on the patient's medial calf of the right leg).  Left leg wrapped in unna boot  Neurological: She is alert.  Skin: Lesion noted. She is not diaphoretic. There is erythema.    Media Information         Document Information   Photos    03/09/2018 13:37  Attached To:  Office Visit on 03/09/18 with Angelita Ingles, MD   Source Information   Angelita Ingles, MD  [facility]      Media Information         Document Information   Photos    03/09/2018 13:37  Attached To:  Office Visit on 03/09/18 with Angelita Ingles, MD   Source Information   Quavion Boule, Kimberlee Nearing, MD  [facility]          Social History   Socioeconomic History  . Marital status: Single    Spouse name: Not on file  . Number of children: Not on file  . Years of education: Not on file  . Highest education level: Not on file  Occupational History  . Not on file  Social Needs  . Financial resource strain: Not on file  . Food insecurity:  Worry: Not on file    Inability: Not on file  . Transportation needs:    Medical: Not on file    Non-medical: Not on file  Tobacco Use  . Smoking status: Never Smoker  . Smokeless tobacco: Never Used  Substance and Sexual Activity  . Alcohol use: No    Alcohol/week: 0.0 oz  . Drug use: No  . Sexual activity: Never  Lifestyle  . Physical activity:    Days per week: Not on file    Minutes per session: Not on file  . Stress: Not on file  Relationships  . Social connections:    Talks on phone: Not on file    Gets together: Not on file    Attends religious service: Not on file    Active member of club or organization: Not on file     Attends meetings of clubs or organizations: Not on file    Relationship status: Not on file  . Intimate partner violence:    Fear of current or ex partner: Not on file    Emotionally abused: Not on file    Physically abused: Not on file    Forced sexual activity: Not on file  Other Topics Concern  . Not on file  Social History Narrative  . Not on file    Family History  Problem Relation Age of Onset  . Heart disease Maternal Aunt   . Heart disease Maternal Uncle   . Stroke Paternal Uncle   . Cancer Paternal Uncle        Melanoma  . Diabetes Maternal Grandmother   . Diabetes Paternal Grandfather     Assessment & Plan:   See Encounters Tab for problem based charting.  Patient discussed with Dr. Heide Spark

## 2018-03-09 NOTE — Patient Instructions (Signed)
We had our wound expert see your wound and has dressed it appropriately with a once weekly dressing.  Please keep the area dry while the dressing is on.  We will refer you to a wound care specialist to help manage the wound from here on out.

## 2018-03-11 NOTE — Progress Notes (Signed)
Internal Medicine Clinic Attending  Case discussed with Dr. Winfrey  at the time of the visit.  We reviewed the resident's history and exam and pertinent patient test results.  I agree with the assessment, diagnosis, and plan of care documented in the resident's note.  

## 2018-03-28 ENCOUNTER — Encounter (HOSPITAL_BASED_OUTPATIENT_CLINIC_OR_DEPARTMENT_OTHER): Payer: Medicare HMO | Attending: Internal Medicine

## 2018-03-28 ENCOUNTER — Ambulatory Visit (HOSPITAL_COMMUNITY)
Admission: RE | Admit: 2018-03-28 | Discharge: 2018-03-28 | Disposition: A | Discharge status: Home / Self Care | Payer: Medicare HMO | Source: Ambulatory Visit | Attending: Internal Medicine | Admitting: Internal Medicine

## 2018-03-28 ENCOUNTER — Other Ambulatory Visit (HOSPITAL_BASED_OUTPATIENT_CLINIC_OR_DEPARTMENT_OTHER): Payer: Self-pay | Admitting: Internal Medicine

## 2018-03-28 DIAGNOSIS — I87332 Chronic venous hypertension (idiopathic) with ulcer and inflammation of left lower extremity: Secondary | ICD-10-CM | POA: Diagnosis not present

## 2018-03-28 DIAGNOSIS — M7989 Other specified soft tissue disorders: Secondary | ICD-10-CM

## 2018-03-28 DIAGNOSIS — M79604 Pain in right leg: Secondary | ICD-10-CM

## 2018-03-28 DIAGNOSIS — M79661 Pain in right lower leg: Secondary | ICD-10-CM | POA: Insufficient documentation

## 2018-03-28 DIAGNOSIS — I739 Peripheral vascular disease, unspecified: Secondary | ICD-10-CM | POA: Diagnosis not present

## 2018-03-28 DIAGNOSIS — G919 Hydrocephalus, unspecified: Secondary | ICD-10-CM | POA: Diagnosis not present

## 2018-03-28 DIAGNOSIS — I890 Lymphedema, not elsewhere classified: Secondary | ICD-10-CM | POA: Diagnosis not present

## 2018-03-28 DIAGNOSIS — L97211 Non-pressure chronic ulcer of right calf limited to breakdown of skin: Secondary | ICD-10-CM | POA: Insufficient documentation

## 2018-03-28 DIAGNOSIS — I87331 Chronic venous hypertension (idiopathic) with ulcer and inflammation of right lower extremity: Secondary | ICD-10-CM | POA: Diagnosis not present

## 2018-03-28 DIAGNOSIS — L97212 Non-pressure chronic ulcer of right calf with fat layer exposed: Secondary | ICD-10-CM | POA: Diagnosis not present

## 2018-03-28 IMAGING — US US EXTREM LOW VENOUS*L*
1 series · 13 of 24 positions shown · non-contrast
Comparison: None.

CLINICAL DATA: Left leg swelling and redness for 1 week



[Series 1: us extrem low venous*left* · 0.07mm/px · 13 of 35 slices shown]
[im 1/35]
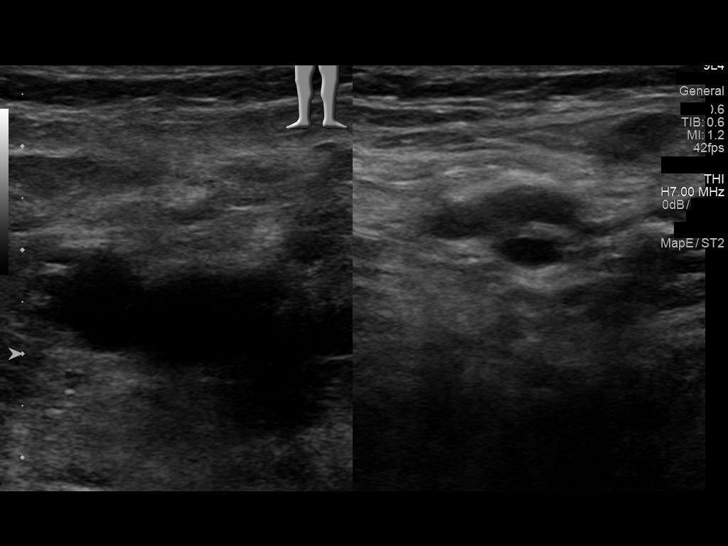
[im 3/35]
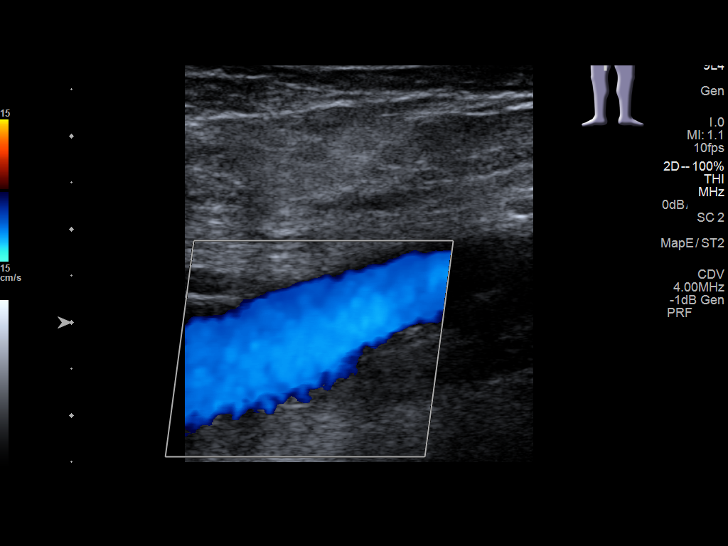
[im 6/35]
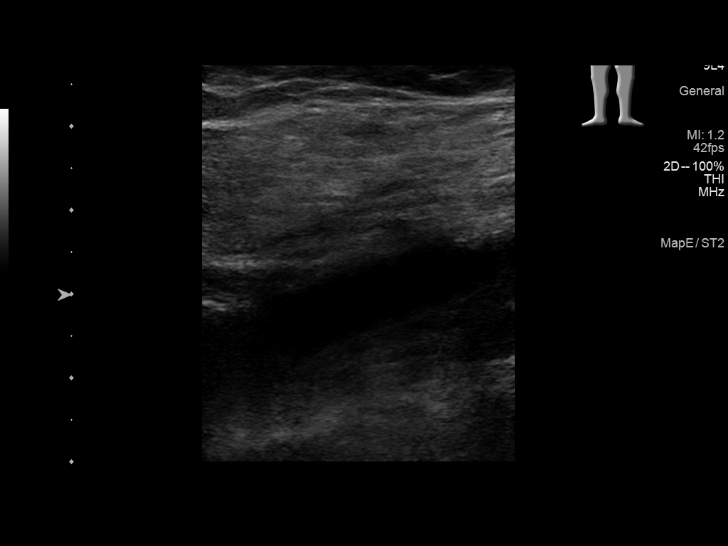
[im 9/35]
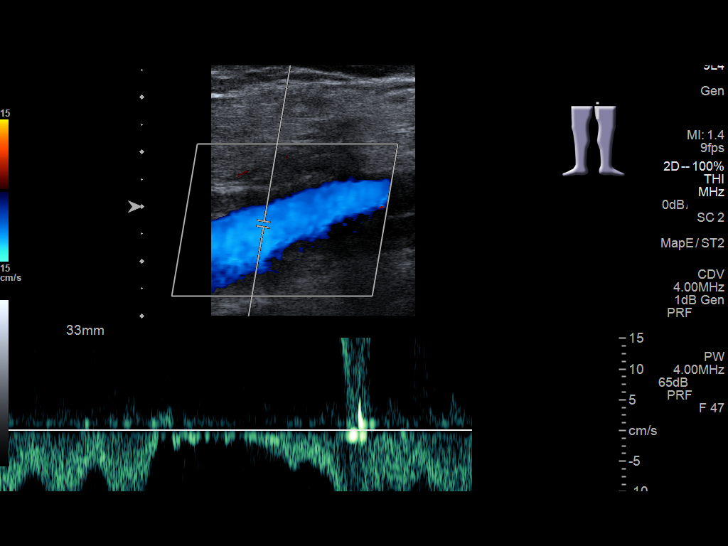
[im 12/35]
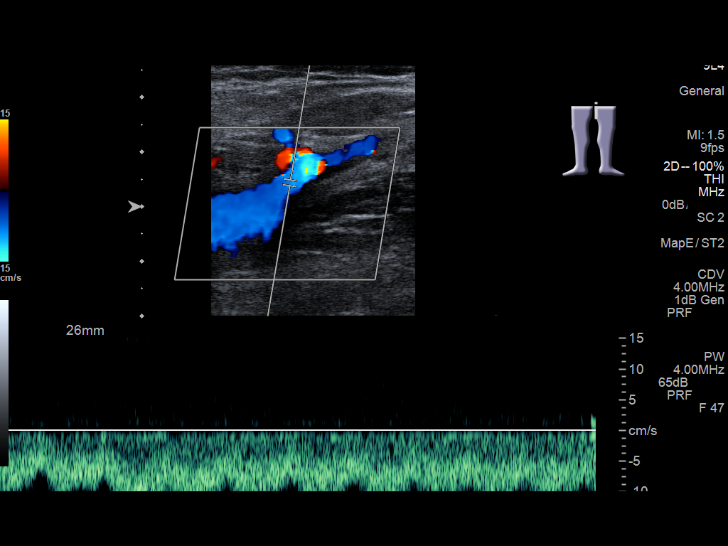
[im 15/35]
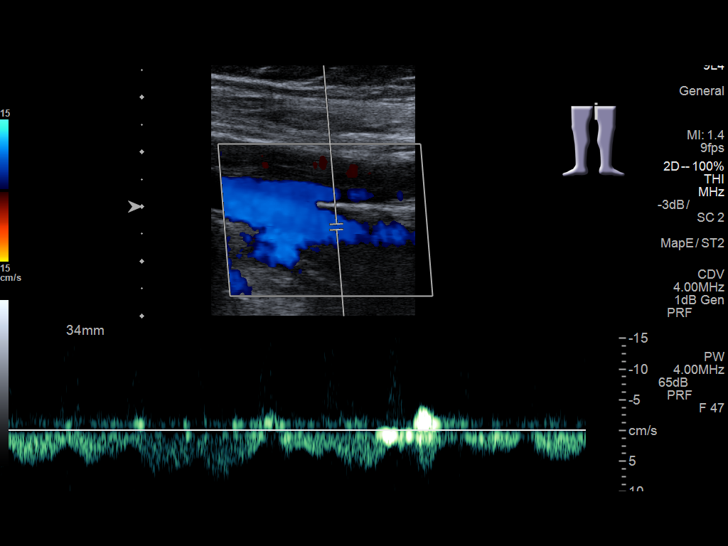
[im 18/35]
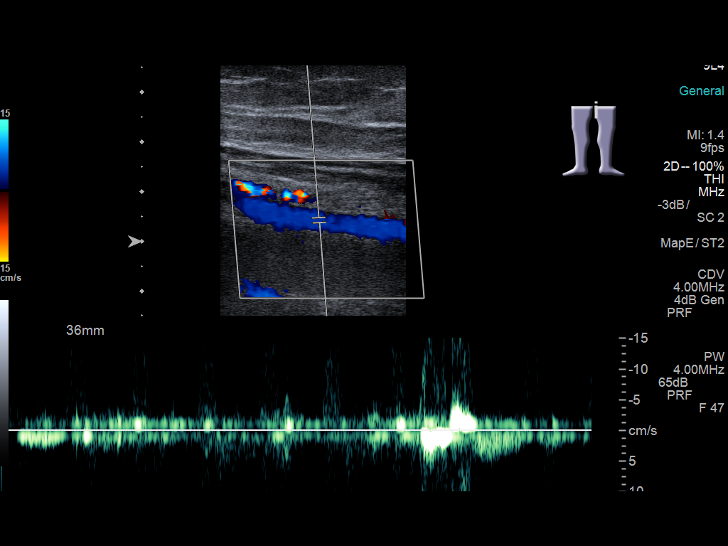
[im 20/35]
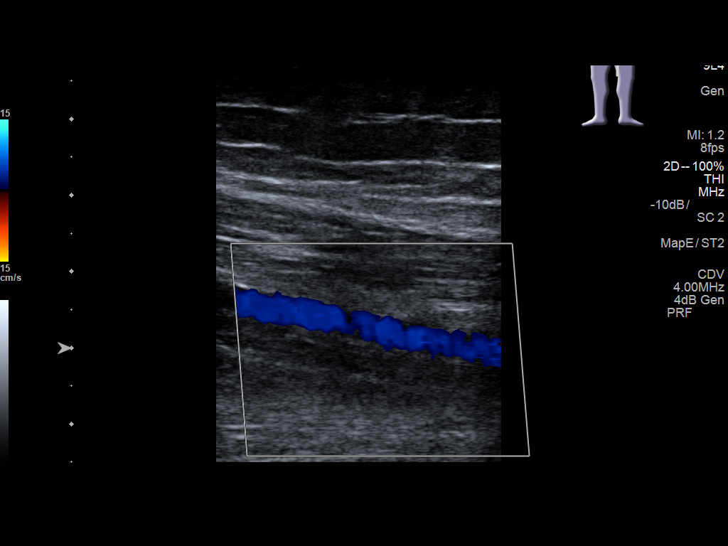
[im 23/35]
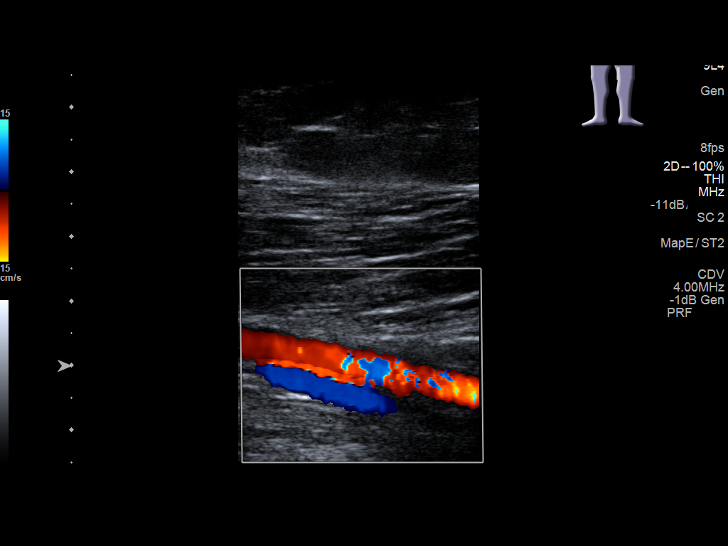
[im 26/35]
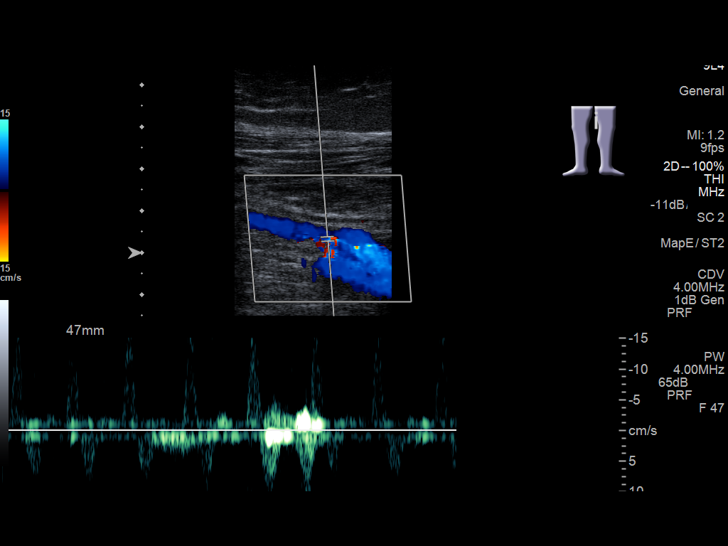
[im 29/35]
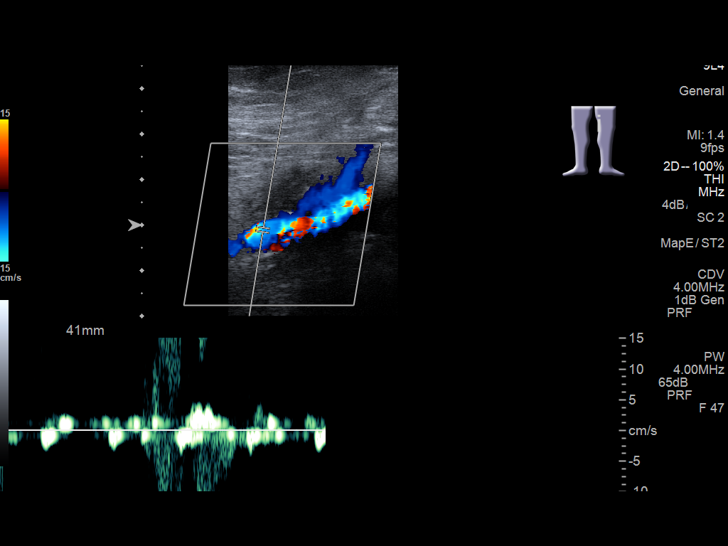
[im 32/35]
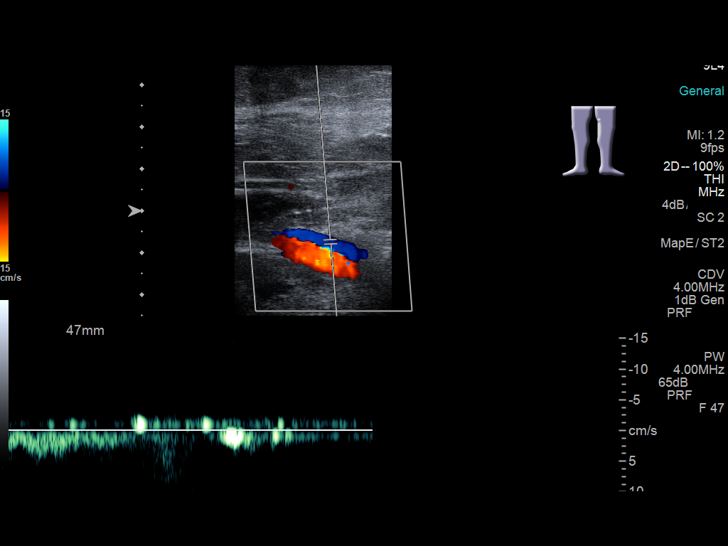
[im 35/35]
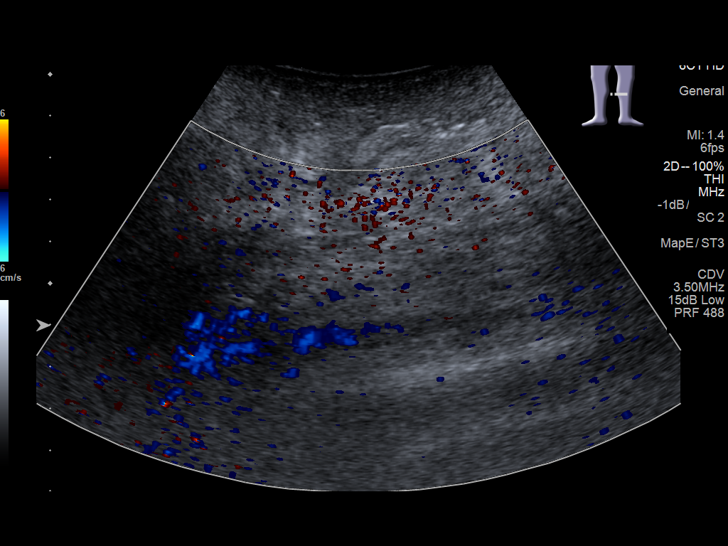

[13 of 24 positions shown; findings below may reference images not displayed]

FINDINGS: Contralateral Common Femoral Vein: Respiratory phasicity is normal
and symmetric with the symptomatic side. No evidence of thrombus.
Normal compressibility.

Common Femoral Vein: No evidence of thrombus. Normal
compressibility, respiratory phasicity and response to augmentation.

Saphenofemoral Junction: No evidence of thrombus. Normal
compressibility and flow on color Doppler imaging.

Profunda Femoral Vein: No evidence of thrombus. Normal
compressibility and flow on color Doppler imaging.

Femoral Vein: No evidence of thrombus. Normal compressibility,
respiratory phasicity and response to augmentation.

Popliteal Vein: No evidence of thrombus. Normal compressibility,
respiratory phasicity and response to augmentation.

Calf Veins: No definite thrombosis, but evaluation is limited
secondary to soft tissue edema.

Superficial Great Saphenous Vein: No evidence of thrombus. Normal
compressibility and flow on color Doppler imaging.

Venous Reflux:  None.

Other Findings:  None.
IMPRESSION: No evidence of deep venous thrombosis of the left lower extremity.

## 2018-03-28 NOTE — Progress Notes (Signed)
*  Preliminary Results* Right lower extremity venous duplex completed. Right lower extremity is negative for deep vein thrombosis. There is no evidence of right Baker's cyst.  03/28/2018 1:19 PM  Gertie FeyMichelle Kelvyn Schunk, BS, RVT, RDCS, RDMS

## 2018-04-04 DIAGNOSIS — I87332 Chronic venous hypertension (idiopathic) with ulcer and inflammation of left lower extremity: Secondary | ICD-10-CM | POA: Diagnosis not present

## 2018-04-04 DIAGNOSIS — L97212 Non-pressure chronic ulcer of right calf with fat layer exposed: Secondary | ICD-10-CM | POA: Diagnosis not present

## 2018-04-04 DIAGNOSIS — L299 Pruritus, unspecified: Secondary | ICD-10-CM | POA: Diagnosis not present

## 2018-04-04 DIAGNOSIS — L97211 Non-pressure chronic ulcer of right calf limited to breakdown of skin: Secondary | ICD-10-CM | POA: Diagnosis not present

## 2018-04-04 DIAGNOSIS — G919 Hydrocephalus, unspecified: Secondary | ICD-10-CM | POA: Diagnosis not present

## 2018-04-04 DIAGNOSIS — I739 Peripheral vascular disease, unspecified: Secondary | ICD-10-CM | POA: Diagnosis not present

## 2018-04-04 DIAGNOSIS — I87311 Chronic venous hypertension (idiopathic) with ulcer of right lower extremity: Secondary | ICD-10-CM | POA: Diagnosis not present

## 2018-04-04 DIAGNOSIS — I890 Lymphedema, not elsewhere classified: Secondary | ICD-10-CM | POA: Diagnosis not present

## 2018-04-11 ENCOUNTER — Ambulatory Visit
Payer: Medicare HMO | Attending: Student in an Organized Health Care Education/Training Program | Admitting: Occupational Therapy

## 2018-04-11 DIAGNOSIS — G919 Hydrocephalus, unspecified: Secondary | ICD-10-CM | POA: Diagnosis not present

## 2018-04-11 DIAGNOSIS — I890 Lymphedema, not elsewhere classified: Secondary | ICD-10-CM | POA: Insufficient documentation

## 2018-04-11 DIAGNOSIS — L97211 Non-pressure chronic ulcer of right calf limited to breakdown of skin: Secondary | ICD-10-CM | POA: Diagnosis not present

## 2018-04-11 DIAGNOSIS — I87311 Chronic venous hypertension (idiopathic) with ulcer of right lower extremity: Secondary | ICD-10-CM | POA: Diagnosis not present

## 2018-04-11 DIAGNOSIS — I87332 Chronic venous hypertension (idiopathic) with ulcer and inflammation of left lower extremity: Secondary | ICD-10-CM | POA: Diagnosis not present

## 2018-04-11 DIAGNOSIS — I739 Peripheral vascular disease, unspecified: Secondary | ICD-10-CM | POA: Diagnosis not present

## 2018-04-11 DIAGNOSIS — L97212 Non-pressure chronic ulcer of right calf with fat layer exposed: Secondary | ICD-10-CM | POA: Diagnosis not present

## 2018-04-11 NOTE — Therapy (Signed)
[place] MAIN [facility] SERVICES [address] [place], [place], [address] Phone: [phone]   Fax:  [phone]  Occupational Therapy Treatment  Patient Details  Name: Alison Fox MRN: [id number] Date of Birth: Sep 26, 1971 No data recorded  Encounter Date: 04/11/2018  OT End of Session - 04/11/18 1035    Visit Number  17    Number of Visits  36    Date for OT Re-Evaluation  05/11/17    Authorization Type  28 for 2018    OT Start Time  0900    OT Stop Time  1025    OT Time Calculation (min)  85 min    Activity Tolerance  Patient tolerated treatment well;No increased pain    Behavior During Therapy  WFL for tasks assessed/performed       Past Medical History:  Diagnosis Date  . 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 Glaucoma 08/22/2014  . Headache   . 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 On 01/21/15, she saw Dr. Kathyrn Sheriff at [facility].  She had a repeat MRI of the brain without contrast which demonstrated symmetric ventriculomegaly of the lateral ventricles and third ventricle without significant transependymal flow.  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  Endoscopic third ventriculostomy was offered but not insisted since her only symptoms are headache, which is now mild.  She did not wish to proceed with this procedure.  . Lymphedema    Chronic following left knee surgery in 2000.  . Varicose veins     Past Surgical History:  Procedure Laterality Date  . EYE SURGERY    . KNEE ARTHROSCOPY  2000   Left knee.     There were no vitals filed for this visit.  Subjective Assessment - 04/11/18 0915    Subjective   Pt presents for OT visit 17/36  for 2019 for CDT for 6 week follow-along lymphedema care visit. Pt was last seen on 02/21/2018. Pt  presents w/ compression stockings in place. Parent states, "I've been wrapping0Ms. Prosise reports she recently had a negative Doppler stody of the RLE. She is presently going to wound care clinic for care of RLE venous ulcer.  (Pended)     Patient is accompained by:  Family member  (Pended)     Pertinent History  onset in 22s and positive family history for maternal grandmother and cousin suggests primary LE Tarda; arthroscopic knee sx ~ 2000?, recent fall w/ head laceration. 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  (Pended)     Limitations  difficulty walking, decreased balance, suspected BLE suspected, R>L,    (Pended)     Special Tests  get the swelling down and keep it down  (Pended)     Patient Stated Goals  replace my stockings and get the swelling down again.  (Pended)     Pain Onset  1 to 4 weeks ago  (Pended)  3 weeks                   OT Treatments/Exercises (OP) - 04/11/18 0001      ADLs   ADL Education Given  Yes      Exercises  Exercises  -- Lymphatic pumping therex-NuStep w/o resistance 1.02 miles      Manual Therapy   Manual Therapy  Edema management;Compression Bandaging    Edema Management  completed anatomical measurements for BLE custom compression garment replacements.    Compression Bandaging  LLE A-G 6 layer wrap as established using gradient techniques.             OT Education - 04/11/18 1034    Education provided  Yes    Education Details  Pt education cont for changes in compression garment reccommendations, also reviewed garm,ent replacement process and provided contact info for DME provider.    Person(s) Educated  Patient;Parent(s)    Methods  Explanation;Demonstration;Handout    Comprehension  Verbalized understanding;Returned demonstration;Need further  instruction       OT Short Term Goals - 09/01/16 1356      OT SHORT TERM GOAL #1   Title  Lymphedema (LE) management/ self-care: Pt able to apply multi layered, gradient compression wraps with MAX caregiver assistance using proper techniques within 2 weeks to achieve optimal limb volume reduction.    Baseline  max caregiver assist; caregiver min assist    Time  2    Period  Weeks    Status  New      OT SHORT TERM GOAL #2   Title  Lymphedema (LE) management/ self-care:  Pt to achieve at least 10% LLE limb volume reductions bilaterally during Intensive CDT to limit LE progression, decrease infection and falls risk, to reduce pain/, and to improve safe ambulation and functional mobility.    Baseline  max assist    Time  12    Period  Weeks    Status  New      OT SHORT TERM GOAL #3   Title  Lymphedema (LE) management/ self-care:  Pt >/= 85 % compliant with all daily, LE self-care protocols for home program w/ needed level of caregiver assistance , including simple self-manual lymphatic drainage (MLD), skin care, lymphatic pumping the ex, skin care, and donning/ doffing compression wraps and garments o limit LE progression and further functional decline.      Baseline  Max A    Time  12    Period  Weeks    Status  New      OT SHORT TERM GOAL #4   Title  Lymphedema (LE) management/ self-care:  Pt to tolerate daily compression wraps, garments and devices in keeping w/ prescribed wear regime within 1 week of issue date to progress and retain clinical and functional gains and to limit LE progression.    Baseline  mod A    Time  12    Period  Weeks    Status  New      OT SHORT TERM GOAL #5   Title  Lymphedema (LE) self-care:  During Management Phase CDT Pt to sustain limb volume reductions achieved during Intensive Phase CDT within 5% utilizing LE self-care protocols, appropriate compression garments/ devices, and needed level of caregiver assistance.    Baseline  Max A    Time  6     Period  Months    Status  New        OT Long Term Goals - 01/26/18 0914      OT LONG TERM GOAL #1   Title  Pt able to correctly apply gradient compression wraps to below knee with moderate caregiver assistance for optimal limb volume reduction and improvement in tissue integrity. 11/30/16: PT CURRENTLY REQUIRES  MAX a X1 TO DON COMPRESSION WRAPS.    Baseline  dependent     Time  2    Period  Weeks    Status  Achieved    Target Date  05/11/18      OT LONG TERM GOAL #2   Title  Pt to achieve 15% limb volume reduction in LLE, and 10% reduction in RLE by DC to limit lymphedema (LE) progression and infection risk.- NEW GOAL 25%. 11/30/16: PT HAS MET 10% VOLUMETRIC REDUCTION GOAL BILATERALLY , AND, TO DATE, LLE "BELOW THE KNEE" (A-D) VOLUMETRIC REDUCTION ACHIEVED MEASURES 20.76%.  LLE "TOES TO GROIN" (A-G) LIMB VOLUME REDUCTION MEASURES 15.67% SINCE INITIALLY MEASURED FOR EPISODE 2 ON 08/27/16.    Baseline  dependent Met 01/26/2018    Time  12    Period  Weeks    Status  Achieved    Target Date  05/11/18      OT LONG TERM GOAL #3   Title  Pt IS CONSISTENTLY 85 % compliant with all daily LE self-care protocols w/  MAXIMUM caregiver assistance, including simple self-manual lymphatic drainage (MLD), skin care, lymphatic pumping therex, and donning/ doffing progression garments.. 11/30/16: GOAL MODIFIED TO REFLECT MORE REALISTI, SUSTAINABLE, DEGREE OF SUCCESSFUL LE SELF MANAGEMENT OVER TIME. DECREASED CONSISTENT COMPLIANCE FROM 100% TO 85%, AND PT REQUIRES MAX ASSIST FROM CAREGIVER INSTEAD OF MODERATE ASSISTANCE.     Baseline  dependent 90% compliant on 01/26/18. Goal Met    Time  12    Period  Weeks    Status  Achieved    Target Date  05/11/18      OT LONG TERM GOAL #5   Title  Pt to remain infection free throughout CDT course to limit infection and LE progression.     Baseline  dependent    Time  12    Period  Weeks    Status  Achieved    Target Date  05/11/18      OT LONG TERM GOAL #6    Title  During Management Phase CDT Pt to sustain limb volume reductions achieved during Intensive Phase CDT within 5% utilizing LE self-care protocols, appropriate compression garments/ devices, and moderate caregiver assistance.    Baseline  dependent    Time  6    Period  Months    Status  Achieved            Plan - 04/11/18 1038    Clinical Impression Statement  Pt presents to clinic with compression garment in place. Mother has been applying LLE thigh length wraps daily as existing compression thigh high no longer contains swelling. All existing garments are worn out and replacements are medically necessary to control lymphedema and limit progression. Pt with medciated knee length wrap on RLE. Dressing not removed. Completed anatomical measurements for LLE w/ increase from ccl 3(32-1mHg) to ccl 29F ( same compression with denser weave to improve containment). Repeated RLE knee high measurements as existing garmentn fits and functions well. No changes. Applied compression wrap to LLE using new Foam. Pt will wash and wear new garments when they arrive. We'll see her back for garment assessment and fitting ASAP.    Occupational performance deficits (Please refer to evaluation for details):  ADL's;IADL's;Rest and Sleep;Work;Leisure;Social Participation;Other body image    Rehab Potential  Good    OT Frequency  2x / week 3 month F/U and PRN    OT Duration  12 weeks    Clinical Decision Making  Several treatment  options, min-mod task modification necessary    Consulted and Agree with Plan of Care  Patient;Family member/caregiver       Patient will benefit from skilled therapeutic intervention in order to improve the following deficits and impairments:  Abnormal gait, Decreased endurance, Decreased skin integrity, Decreased knowledge of precautions, Decreased scar mobility, Decreased knowledge of use of DME, Impaired flexibility, Decreased balance, Decreased mobility, Difficulty walking,  Decreased coordination  Visit Diagnosis: Lymphedema, not elsewhere classified    Problem List Patient Active Problem List   Diagnosis Date Noted  . Venous stasis ulcer of right calf ([place]) 03/09/2018  . Mitral regurgitation 12/30/2017  . Sinus tachycardia 11/29/2017  . Episodic tension-type headache, not intractable 07/22/2015  . Hydrocephalus, adult 09/10/2014  . Glaucoma 08/22/2014  . Preventative health care 05/18/2011  . Lymphedema 08/06/2006    Andrey Spearman, MS, OTR/L, [facility] 04/11/18 10:45 AM  [place] MAIN [facility] SERVICES [address] [place], [place], [address] Phone: [phone]   Fax:  [phone]  Name: Alison Fox MRN: [id number] Date of Birth: [birth year]

## 2018-04-18 ENCOUNTER — Encounter (HOSPITAL_BASED_OUTPATIENT_CLINIC_OR_DEPARTMENT_OTHER): Payer: Medicare HMO | Attending: Internal Medicine

## 2018-04-18 DIAGNOSIS — L97812 Non-pressure chronic ulcer of other part of right lower leg with fat layer exposed: Secondary | ICD-10-CM | POA: Diagnosis not present

## 2018-04-18 DIAGNOSIS — L97312 Non-pressure chronic ulcer of right ankle with fat layer exposed: Secondary | ICD-10-CM | POA: Diagnosis not present

## 2018-04-18 DIAGNOSIS — I890 Lymphedema, not elsewhere classified: Secondary | ICD-10-CM | POA: Insufficient documentation

## 2018-04-18 DIAGNOSIS — I87311 Chronic venous hypertension (idiopathic) with ulcer of right lower extremity: Secondary | ICD-10-CM | POA: Diagnosis not present

## 2018-04-18 DIAGNOSIS — I87331 Chronic venous hypertension (idiopathic) with ulcer and inflammation of right lower extremity: Secondary | ICD-10-CM | POA: Insufficient documentation

## 2018-04-19 ENCOUNTER — Encounter (HOSPITAL_BASED_OUTPATIENT_CLINIC_OR_DEPARTMENT_OTHER): Payer: Medicare HMO

## 2018-04-25 DIAGNOSIS — L97212 Non-pressure chronic ulcer of right calf with fat layer exposed: Secondary | ICD-10-CM | POA: Diagnosis not present

## 2018-04-25 DIAGNOSIS — I87331 Chronic venous hypertension (idiopathic) with ulcer and inflammation of right lower extremity: Secondary | ICD-10-CM | POA: Diagnosis not present

## 2018-04-25 DIAGNOSIS — I890 Lymphedema, not elsewhere classified: Secondary | ICD-10-CM | POA: Insufficient documentation

## 2018-04-25 DIAGNOSIS — L97812 Non-pressure chronic ulcer of other part of right lower leg with fat layer exposed: Secondary | ICD-10-CM | POA: Insufficient documentation

## 2018-04-25 DIAGNOSIS — I87311 Chronic venous hypertension (idiopathic) with ulcer of right lower extremity: Secondary | ICD-10-CM | POA: Diagnosis not present

## 2018-05-03 DIAGNOSIS — I890 Lymphedema, not elsewhere classified: Secondary | ICD-10-CM | POA: Diagnosis not present

## 2018-05-03 DIAGNOSIS — I87311 Chronic venous hypertension (idiopathic) with ulcer of right lower extremity: Secondary | ICD-10-CM | POA: Diagnosis not present

## 2018-05-03 DIAGNOSIS — L97212 Non-pressure chronic ulcer of right calf with fat layer exposed: Secondary | ICD-10-CM | POA: Diagnosis not present

## 2018-05-03 DIAGNOSIS — L97812 Non-pressure chronic ulcer of other part of right lower leg with fat layer exposed: Secondary | ICD-10-CM | POA: Diagnosis not present

## 2018-05-03 DIAGNOSIS — I87331 Chronic venous hypertension (idiopathic) with ulcer and inflammation of right lower extremity: Secondary | ICD-10-CM | POA: Diagnosis not present

## 2018-05-10 DIAGNOSIS — I890 Lymphedema, not elsewhere classified: Secondary | ICD-10-CM | POA: Diagnosis not present

## 2018-05-10 DIAGNOSIS — I87312 Chronic venous hypertension (idiopathic) with ulcer of left lower extremity: Secondary | ICD-10-CM | POA: Diagnosis not present

## 2018-05-10 DIAGNOSIS — L97212 Non-pressure chronic ulcer of right calf with fat layer exposed: Secondary | ICD-10-CM | POA: Diagnosis not present

## 2018-05-10 DIAGNOSIS — L97812 Non-pressure chronic ulcer of other part of right lower leg with fat layer exposed: Secondary | ICD-10-CM | POA: Diagnosis not present

## 2018-05-10 DIAGNOSIS — I87331 Chronic venous hypertension (idiopathic) with ulcer and inflammation of right lower extremity: Secondary | ICD-10-CM | POA: Diagnosis not present

## 2018-05-17 DIAGNOSIS — L97212 Non-pressure chronic ulcer of right calf with fat layer exposed: Secondary | ICD-10-CM | POA: Diagnosis not present

## 2018-05-17 DIAGNOSIS — L97812 Non-pressure chronic ulcer of other part of right lower leg with fat layer exposed: Secondary | ICD-10-CM | POA: Diagnosis not present

## 2018-05-17 DIAGNOSIS — I87331 Chronic venous hypertension (idiopathic) with ulcer and inflammation of right lower extremity: Secondary | ICD-10-CM | POA: Diagnosis not present

## 2018-05-17 DIAGNOSIS — I890 Lymphedema, not elsewhere classified: Secondary | ICD-10-CM | POA: Diagnosis not present

## 2018-05-18 DIAGNOSIS — Z809 Family history of malignant neoplasm, unspecified: Secondary | ICD-10-CM | POA: Diagnosis not present

## 2018-05-18 DIAGNOSIS — G8929 Other chronic pain: Secondary | ICD-10-CM | POA: Diagnosis not present

## 2018-05-18 DIAGNOSIS — H547 Unspecified visual loss: Secondary | ICD-10-CM | POA: Diagnosis not present

## 2018-05-18 DIAGNOSIS — L97819 Non-pressure chronic ulcer of other part of right lower leg with unspecified severity: Secondary | ICD-10-CM | POA: Diagnosis not present

## 2018-05-18 DIAGNOSIS — G4700 Insomnia, unspecified: Secondary | ICD-10-CM | POA: Diagnosis not present

## 2018-05-18 DIAGNOSIS — I87311 Chronic venous hypertension (idiopathic) with ulcer of right lower extremity: Secondary | ICD-10-CM | POA: Diagnosis not present

## 2018-05-24 ENCOUNTER — Encounter (HOSPITAL_BASED_OUTPATIENT_CLINIC_OR_DEPARTMENT_OTHER): Payer: Medicare HMO | Attending: Internal Medicine

## 2018-05-24 DIAGNOSIS — I87332 Chronic venous hypertension (idiopathic) with ulcer and inflammation of left lower extremity: Secondary | ICD-10-CM | POA: Diagnosis not present

## 2018-05-24 DIAGNOSIS — I872 Venous insufficiency (chronic) (peripheral): Secondary | ICD-10-CM | POA: Diagnosis not present

## 2018-05-24 DIAGNOSIS — L97812 Non-pressure chronic ulcer of other part of right lower leg with fat layer exposed: Secondary | ICD-10-CM | POA: Insufficient documentation

## 2018-05-24 DIAGNOSIS — I890 Lymphedema, not elsewhere classified: Secondary | ICD-10-CM | POA: Diagnosis not present

## 2018-05-24 DIAGNOSIS — L97212 Non-pressure chronic ulcer of right calf with fat layer exposed: Secondary | ICD-10-CM | POA: Diagnosis not present

## 2018-06-01 DIAGNOSIS — L97812 Non-pressure chronic ulcer of other part of right lower leg with fat layer exposed: Secondary | ICD-10-CM | POA: Diagnosis not present

## 2018-06-01 DIAGNOSIS — I87332 Chronic venous hypertension (idiopathic) with ulcer and inflammation of left lower extremity: Secondary | ICD-10-CM | POA: Diagnosis not present

## 2018-06-01 DIAGNOSIS — I890 Lymphedema, not elsewhere classified: Secondary | ICD-10-CM | POA: Diagnosis not present

## 2018-06-01 DIAGNOSIS — L97212 Non-pressure chronic ulcer of right calf with fat layer exposed: Secondary | ICD-10-CM | POA: Diagnosis not present

## 2018-06-01 DIAGNOSIS — I87311 Chronic venous hypertension (idiopathic) with ulcer of right lower extremity: Secondary | ICD-10-CM | POA: Diagnosis not present

## 2018-06-10 DIAGNOSIS — L97812 Non-pressure chronic ulcer of other part of right lower leg with fat layer exposed: Secondary | ICD-10-CM | POA: Diagnosis not present

## 2018-06-10 DIAGNOSIS — I890 Lymphedema, not elsewhere classified: Secondary | ICD-10-CM | POA: Diagnosis not present

## 2018-06-10 DIAGNOSIS — L97212 Non-pressure chronic ulcer of right calf with fat layer exposed: Secondary | ICD-10-CM | POA: Diagnosis not present

## 2018-06-10 DIAGNOSIS — I87332 Chronic venous hypertension (idiopathic) with ulcer and inflammation of left lower extremity: Secondary | ICD-10-CM | POA: Diagnosis not present

## 2018-06-10 DIAGNOSIS — I87311 Chronic venous hypertension (idiopathic) with ulcer of right lower extremity: Secondary | ICD-10-CM | POA: Diagnosis not present

## 2018-06-17 DIAGNOSIS — I890 Lymphedema, not elsewhere classified: Secondary | ICD-10-CM | POA: Diagnosis not present

## 2018-06-17 DIAGNOSIS — I87332 Chronic venous hypertension (idiopathic) with ulcer and inflammation of left lower extremity: Secondary | ICD-10-CM | POA: Diagnosis not present

## 2018-06-17 DIAGNOSIS — L97211 Non-pressure chronic ulcer of right calf limited to breakdown of skin: Secondary | ICD-10-CM | POA: Diagnosis not present

## 2018-06-17 DIAGNOSIS — L97812 Non-pressure chronic ulcer of other part of right lower leg with fat layer exposed: Secondary | ICD-10-CM | POA: Diagnosis not present

## 2018-06-17 DIAGNOSIS — I87311 Chronic venous hypertension (idiopathic) with ulcer of right lower extremity: Secondary | ICD-10-CM | POA: Diagnosis not present

## 2018-06-24 ENCOUNTER — Encounter (HOSPITAL_BASED_OUTPATIENT_CLINIC_OR_DEPARTMENT_OTHER): Payer: Medicare HMO | Attending: Internal Medicine

## 2018-06-24 DIAGNOSIS — L739 Follicular disorder, unspecified: Secondary | ICD-10-CM | POA: Diagnosis not present

## 2018-06-24 DIAGNOSIS — L03115 Cellulitis of right lower limb: Secondary | ICD-10-CM | POA: Insufficient documentation

## 2018-06-24 DIAGNOSIS — I87332 Chronic venous hypertension (idiopathic) with ulcer and inflammation of left lower extremity: Secondary | ICD-10-CM | POA: Diagnosis not present

## 2018-06-24 DIAGNOSIS — I87311 Chronic venous hypertension (idiopathic) with ulcer of right lower extremity: Secondary | ICD-10-CM | POA: Diagnosis not present

## 2018-06-24 DIAGNOSIS — L97812 Non-pressure chronic ulcer of other part of right lower leg with fat layer exposed: Secondary | ICD-10-CM | POA: Diagnosis not present

## 2018-06-24 DIAGNOSIS — L97212 Non-pressure chronic ulcer of right calf with fat layer exposed: Secondary | ICD-10-CM | POA: Diagnosis not present

## 2018-06-24 DIAGNOSIS — I890 Lymphedema, not elsewhere classified: Secondary | ICD-10-CM | POA: Insufficient documentation

## 2018-06-24 NOTE — Progress Notes (Signed)
NEUROLOGY FOLLOW UP OFFICE NOTE  Alison Fox [id number]  HISTORY OF PRESENT ILLNESS: Alison Fox is a 47 year old right-handed woman with non-obstructive hydrocephalus, glaucoma, lymphedema who follows up for tension-type headache.  She is accompanied by her mother who supplements history.  UPDATE: Headaches controlled. Intensity:  moderate Duration:  15 to 60 minutes (without pain reliever) Frequency:  1 to 2 days a week Frequency of abortive medication: not applicable Current NSAIDS:  no Current analgesics:  no Current triptans:  no Current ergotamine:  no Current anti-emetic:  no Current muscle relaxants:  no Current anti-anxiolytic:  no Current sleep aide:  no Current Antihypertensive medications:  no Current Antidepressant medications:  Nortriptyline 100mg  at beditme Current Anticonvulsant medications:  no Current anti-CGRP:  no Current Vitamins/Herbal/Supplements:  no Current Antihistamines/Decongestants:  no Other therapy:  no Other medication:  no  Depression:  no; Anxiety:  No  A few weeks ago, she developed pain in her neck, particularly on the left side.  Neck movement aggravates it, particularly turning to the left.  It is worse when laying in bed.  She keeps her head bent to the right.  She says her head feels "heavy" and difficult to hold up.  She denies any preceding trauma.    She also has had some falls.   HISTORY: Headaches started in 2015. The headaches are bi-frontal, non-throbbing sharp pain, and 8.5-9/10 intensity. Initially, they lasted all day and occured daily. They are associated with dizziness but not nausea, vomiting, photophobia, phonophobia, osmophobia, visual disturbance or unilateral numbness or weakness. She was taking Advil or Tylenol daily. She denied preceding trauma or precipitating factor. She denies aggravating or relieving factors.  She has history of occasional mild headache, but nothing like this. She reportedly has  longstanding history of glaucoma since childhood, amblyopia of the left eye. As per her exam from 08/07/14 by her ophthalmologist, Dr. Ames Coupe, fundi are unremarkable.   A CT of the head performed on 09/10/14 showed symmetric bilateral ventriculomegaly, enlargement of the third ventricle, minimal enlargement of the fourth ventricle and poorly visualized cerebral acqueduct. Thinning of the overlying cortex suggests this is chronic hydrocephalus.  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 On 01/21/15, she saw Dr. Conchita Paris at [facility].  She had a repeat MRI of the brain without contrast which demonstrated symmetric ventriculomegaly of the lateral ventricles and third ventricle without significant transependymal flow.  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  Endoscopic third ventriculostomy was offered but not insisted since her only symptoms are headache, which is now mild.  She did not wish to proceed with this procedure.  PAST MEDICAL HISTORY: Past Medical History:  Diagnosis Date  . 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 Glaucoma 08/22/2014  . Headache   . 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 On 01/21/15, she saw Dr. Conchita Paris at [facility].  She had a repeat MRI of the brain without contrast which demonstrated symmetric ventriculomegaly of the lateral ventricles and third ventricle without significant transependymal flow.  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  Endoscopic third ventriculostomy was offered but not insisted since her only symptoms are headache, which is now mild.  She did not wish to proceed with this procedure.  . Lymphedema    Chronic  following left knee surgery in 2000.  . Varicose veins     MEDICATIONS: Current Outpatient Medications on File Prior to Visit  Medication Sig Dispense Refill  . nortriptyline (PAMELOR) 50 MG capsule Take 2 capsules (100 mg total) by mouth at bedtime. 180 capsule 2  . Olopatadine HCl 0.2 % SOLN Place 1 drop into both eyes every morning.  3   No current facility-administered medications on file prior to visit.     ALLERGIES: No Known Allergies  FAMILY HISTORY: Family History  Problem Relation Age of Onset  . Heart disease Maternal Aunt   . Heart disease Maternal Uncle   . Stroke Paternal Uncle   . Cancer Paternal Uncle        Melanoma  . Diabetes Maternal Grandmother   . Diabetes Paternal Grandfather    SOCIAL HISTORY: Social History   Socioeconomic History  . Marital status: Single    Spouse name: Not on file  . Number of children: Not on file  . Years of education: Not on file  . Highest education level: Not on file  Occupational History  . Not on file  Social Needs  . Financial resource strain: Not on file  . Food insecurity:    Worry: Not on file    Inability: Not on file  . Transportation needs:    Medical: Not on file    Non-medical: Not on file  Tobacco Use  . Smoking status: Never Smoker  . Smokeless tobacco: Never Used  Substance and Sexual Activity  . Alcohol use: No    Alcohol/week: 0.0 standard drinks  . Drug use: No  . Sexual activity: Never  Lifestyle  . Physical activity:    Days per week: Not on file    Minutes per session: Not on file  . Stress: Not on file  Relationships  . Social connections:    Talks on phone: Not on file    Gets together: Not on file    Attends religious service: Not on file    Active member of club or organization: Not on file    Attends meetings of clubs or organizations: Not on file    Relationship status: Not on file  . Intimate partner violence:    Fear of current or ex partner: Not on file    Emotionally  abused: Not on file    Physically abused: Not on file    Forced sexual activity: Not on file  Other Topics Concern  . Not on file  Social History Narrative  . Not on file    REVIEW OF SYSTEMS: Constitutional: No fevers, chills, or sweats, no generalized fatigue, change in appetite Eyes: No visual changes, double vision, eye pain Ear, nose and throat: No hearing loss, ear pain, nasal congestion, sore throat Cardiovascular: No chest pain, palpitations Respiratory:  No shortness of breath at rest or with exertion, wheezes GastrointestinaI: No nausea, vomiting, diarrhea, abdominal pain, fecal incontinence Genitourinary:  No dysuria, urinary retention or frequency Musculoskeletal:  Neck pain Integumentary: No rash, pruritus, skin lesions Neurological: as above Psychiatric: No depression, insomnia, anxiety Endocrine: No palpitations, fatigue, diaphoresis, mood swings, change in appetite, change in weight, increased thirst Hematologic/Lymphatic:  No purpura, petechiae. Allergic/Immunologic: no itchy/runny eyes, nasal congestion, recent allergic reactions, rashes  PHYSICAL EXAM: Blood pressure 108/78, pulse 99, height 5' 8.5" (1.74 m), weight 169 lb (76.7 kg), SpO2 98 %. General: No acute distress.  Patient appears well-groomed.   Head:  Normocephalic/atraumatic Eyes:  Fundi examined but not  visualized Neck: supple.  Neck turned and side-bent right, left sided tenderness, decreased range of motion with turning and side-bending left. Heart:  Regular rate and rhythm Lungs:  Clear to auscultation bilaterally Back: No paraspinal tenderness Neurological Exam: alert and oriented to person, place, and time. Attention span and concentration intact, recent and remote memory intact, fund of knowledge intact.  Speech fluent and not dysarthric, language intact.  CN II-XII intact. Bulk and tone normal, muscle strength 5/5 throughout.  Sensation to light touch  intact.  Deep tendon reflexes 2+  throughout.  Finger to nose testing intact.  Gait normal, Romberg negative.  IMPRESSION: Episodic tension-type headache, not intractable Cervical dystonia.  Acute onset with pain  PLAN: 1.  Nortriptyline 100mg  at bedtime 2.  Advil or Tylenol for abortive therapy. 3.  Limit use of pain relievers to no more than 2 days out of week to prevent risk of rebound or medication-overuse headache. 4.  Keep headache diary 5.  Tizanidine 2mg  at bedtime 6.  Given the acute onset of dystonia and neck pain (as well as falls), will get MRI of cervical spine.  If unremarkable, may need to refer for Botox. 7.  Follow up in 9 months.  Shon Millet, DO  CC: Tyson Alias, MD

## 2018-06-27 ENCOUNTER — Ambulatory Visit: Payer: Medicare HMO | Admitting: Neurology

## 2018-06-27 ENCOUNTER — Encounter: Payer: Self-pay | Admitting: Neurology

## 2018-06-27 VITALS — BP 108/78 | HR 99 | Ht 68.5 in | Wt 169.0 lb

## 2018-06-27 DIAGNOSIS — G919 Hydrocephalus, unspecified: Secondary | ICD-10-CM

## 2018-06-27 DIAGNOSIS — G243 Spasmodic torticollis: Secondary | ICD-10-CM | POA: Diagnosis not present

## 2018-06-27 DIAGNOSIS — G44219 Episodic tension-type headache, not intractable: Secondary | ICD-10-CM | POA: Diagnosis not present

## 2018-06-27 DIAGNOSIS — G249 Dystonia, unspecified: Secondary | ICD-10-CM | POA: Diagnosis not present

## 2018-06-27 MED ORDER — TIZANIDINE HCL 2 MG PO CAPS
2.0000 mg | ORAL_CAPSULE | Freq: Every day | ORAL | 3 refills | Status: AC
Start: 2018-06-27 — End: ?

## 2018-06-27 NOTE — Patient Instructions (Addendum)
1.  Continue nortriptyline 100mg  at bedtime 2.  We will check MRI of cervical spine 3.  Take tizanidine 2mg  at bedtime 4.  Follow up in 9 months.  We have sent a referral to [facility] for your MRI and they will call you directly to schedule your appt. They are located at [address] [facility]. If you need to contact them directly please call [phone].

## 2018-07-01 DIAGNOSIS — I87311 Chronic venous hypertension (idiopathic) with ulcer of right lower extremity: Secondary | ICD-10-CM | POA: Diagnosis not present

## 2018-07-01 DIAGNOSIS — L03115 Cellulitis of right lower limb: Secondary | ICD-10-CM | POA: Diagnosis not present

## 2018-07-01 DIAGNOSIS — L97812 Non-pressure chronic ulcer of other part of right lower leg with fat layer exposed: Secondary | ICD-10-CM | POA: Diagnosis not present

## 2018-07-01 DIAGNOSIS — I87332 Chronic venous hypertension (idiopathic) with ulcer and inflammation of left lower extremity: Secondary | ICD-10-CM | POA: Diagnosis not present

## 2018-07-01 DIAGNOSIS — I890 Lymphedema, not elsewhere classified: Secondary | ICD-10-CM | POA: Diagnosis not present

## 2018-07-01 DIAGNOSIS — S81801A Unspecified open wound, right lower leg, initial encounter: Secondary | ICD-10-CM | POA: Diagnosis not present

## 2018-07-01 DIAGNOSIS — L739 Follicular disorder, unspecified: Secondary | ICD-10-CM | POA: Diagnosis not present

## 2018-07-01 DIAGNOSIS — L97212 Non-pressure chronic ulcer of right calf with fat layer exposed: Secondary | ICD-10-CM | POA: Diagnosis not present

## 2018-07-05 ENCOUNTER — Emergency Department (HOSPITAL_COMMUNITY): Payer: Medicare HMO

## 2018-07-05 ENCOUNTER — Inpatient Hospital Stay (HOSPITAL_COMMUNITY)
Admission: EM | Admit: 2018-07-05 | Discharge: 2018-07-19 | DRG: 082 | Disposition: E | Payer: Medicare HMO | Attending: Student in an Organized Health Care Education/Training Program | Admitting: Student in an Organized Health Care Education/Training Program

## 2018-07-05 DIAGNOSIS — I10 Essential (primary) hypertension: Secondary | ICD-10-CM | POA: Diagnosis not present

## 2018-07-05 DIAGNOSIS — R4182 Altered mental status, unspecified: Secondary | ICD-10-CM | POA: Diagnosis not present

## 2018-07-05 DIAGNOSIS — R402323 Coma scale, best motor response, extension, at hospital admission: Secondary | ICD-10-CM | POA: Diagnosis present

## 2018-07-05 DIAGNOSIS — Z833 Family history of diabetes mellitus: Secondary | ICD-10-CM

## 2018-07-05 DIAGNOSIS — R404 Transient alteration of awareness: Secondary | ICD-10-CM | POA: Diagnosis not present

## 2018-07-05 DIAGNOSIS — Z515 Encounter for palliative care: Secondary | ICD-10-CM

## 2018-07-05 DIAGNOSIS — H409 Unspecified glaucoma: Secondary | ICD-10-CM | POA: Diagnosis present

## 2018-07-05 DIAGNOSIS — S065X0A Traumatic subdural hemorrhage without loss of consciousness, initial encounter: Secondary | ICD-10-CM | POA: Diagnosis not present

## 2018-07-05 DIAGNOSIS — S065X9A Traumatic subdural hemorrhage with loss of consciousness of unspecified duration, initial encounter: Secondary | ICD-10-CM | POA: Diagnosis not present

## 2018-07-05 DIAGNOSIS — R000 Tachycardia, unspecified: Secondary | ICD-10-CM

## 2018-07-05 DIAGNOSIS — Z8249 Family history of ischemic heart disease and other diseases of the circulatory system: Secondary | ICD-10-CM

## 2018-07-05 DIAGNOSIS — R4020 Unspecified coma: Secondary | ICD-10-CM | POA: Diagnosis not present

## 2018-07-05 DIAGNOSIS — S065XAA Traumatic subdural hemorrhage with loss of consciousness status unknown, initial encounter: Secondary | ICD-10-CM | POA: Diagnosis present

## 2018-07-05 DIAGNOSIS — G935 Compression of brain: Secondary | ICD-10-CM | POA: Diagnosis present

## 2018-07-05 DIAGNOSIS — Z79899 Other long term (current) drug therapy: Secondary | ICD-10-CM

## 2018-07-05 DIAGNOSIS — F4325 Adjustment disorder with mixed disturbance of emotions and conduct: Secondary | ICD-10-CM | POA: Diagnosis present

## 2018-07-05 DIAGNOSIS — J9600 Acute respiratory failure, unspecified whether with hypoxia or hypercapnia: Secondary | ICD-10-CM | POA: Diagnosis not present

## 2018-07-05 DIAGNOSIS — I890 Lymphedema, not elsewhere classified: Secondary | ICD-10-CM | POA: Diagnosis present

## 2018-07-05 DIAGNOSIS — R509 Fever, unspecified: Secondary | ICD-10-CM | POA: Diagnosis not present

## 2018-07-05 DIAGNOSIS — Z823 Family history of stroke: Secondary | ICD-10-CM

## 2018-07-05 DIAGNOSIS — I340 Nonrheumatic mitral (valve) insufficiency: Secondary | ICD-10-CM | POA: Diagnosis present

## 2018-07-05 DIAGNOSIS — Z66 Do not resuscitate: Secondary | ICD-10-CM | POA: Diagnosis not present

## 2018-07-05 DIAGNOSIS — I83012 Varicose veins of right lower extremity with ulcer of calf: Secondary | ICD-10-CM | POA: Diagnosis present

## 2018-07-05 DIAGNOSIS — J9691 Respiratory failure, unspecified with hypoxia: Secondary | ICD-10-CM | POA: Diagnosis not present

## 2018-07-05 DIAGNOSIS — R402113 Coma scale, eyes open, never, at hospital admission: Secondary | ICD-10-CM | POA: Diagnosis present

## 2018-07-05 DIAGNOSIS — Z9181 History of falling: Secondary | ICD-10-CM

## 2018-07-05 DIAGNOSIS — G911 Obstructive hydrocephalus: Secondary | ICD-10-CM | POA: Diagnosis present

## 2018-07-05 DIAGNOSIS — W1830XA Fall on same level, unspecified, initial encounter: Secondary | ICD-10-CM | POA: Diagnosis present

## 2018-07-05 LAB — CBC
HCT: 25.4 % — ABNORMAL LOW (ref 36.0–46.0)
Hemoglobin: 8 g/dL — ABNORMAL LOW (ref 12.0–15.0)
MCH: 30.1 pg (ref 26.0–34.0)
MCHC: 31.5 g/dL (ref 30.0–36.0)
MCV: 95.5 fL (ref 78.0–100.0)
PLATELETS: 159 10*3/uL (ref 150–400)
RBC: 2.66 MIL/uL — ABNORMAL LOW (ref 3.87–5.11)
RDW: 12.7 % (ref 11.5–15.5)
WBC: 11.6 10*3/uL — ABNORMAL HIGH (ref 4.0–10.5)

## 2018-07-05 LAB — DIFFERENTIAL
ABS IMMATURE GRANULOCYTES: 0.1 10*3/uL (ref 0.0–0.1)
BASOS PCT: 0 %
Basophils Absolute: 0 10*3/uL (ref 0.0–0.1)
EOS ABS: 0 10*3/uL (ref 0.0–0.7)
Eosinophils Relative: 0 %
Immature Granulocytes: 1 %
LYMPHS ABS: 0.8 10*3/uL (ref 0.7–4.0)
Lymphocytes Relative: 7 %
MONO ABS: 0.8 10*3/uL (ref 0.1–1.0)
MONOS PCT: 7 %
Neutro Abs: 9.8 10*3/uL — ABNORMAL HIGH (ref 1.7–7.7)
Neutrophils Relative %: 85 %

## 2018-07-05 LAB — I-STAT ARTERIAL BLOOD GAS, ED
ACID-BASE EXCESS: 5 mmol/L — AB (ref 0.0–2.0)
Bicarbonate: 28.9 mmol/L — ABNORMAL HIGH (ref 20.0–28.0)
O2 SAT: 100 %
PH ART: 7.44 (ref 7.350–7.450)
Patient temperature: 38.8
TCO2: 30 mmol/L (ref 22–32)
pCO2 arterial: 43.2 mmHg (ref 32.0–48.0)
pO2, Arterial: 358 mmHg — ABNORMAL HIGH (ref 83.0–108.0)

## 2018-07-05 LAB — I-STAT CHEM 8, ED
BUN: 17 mg/dL (ref 6–20)
Calcium, Ion: 0.83 mmol/L — CL (ref 1.15–1.40)
Chloride: 104 mmol/L (ref 98–111)
Creatinine, Ser: 0.7 mg/dL (ref 0.44–1.00)
GLUCOSE: 131 mg/dL — AB (ref 70–99)
HCT: 33 % — ABNORMAL LOW (ref 36.0–46.0)
Hemoglobin: 11.2 g/dL — ABNORMAL LOW (ref 12.0–15.0)
Potassium: 4.3 mmol/L (ref 3.5–5.1)
SODIUM: 134 mmol/L — AB (ref 135–145)
TCO2: 26 mmol/L (ref 22–32)

## 2018-07-05 LAB — COMPREHENSIVE METABOLIC PANEL
ALT: 13 U/L (ref 0–44)
AST: 30 U/L (ref 15–41)
Albumin: 2.2 g/dL — ABNORMAL LOW (ref 3.5–5.0)
Alkaline Phosphatase: 53 U/L (ref 38–126)
Anion gap: 8 (ref 5–15)
BILIRUBIN TOTAL: 1.1 mg/dL (ref 0.3–1.2)
BUN: 10 mg/dL (ref 6–20)
CHLORIDE: 119 mmol/L — AB (ref 98–111)
CO2: 15 mmol/L — ABNORMAL LOW (ref 22–32)
Calcium: 5.4 mg/dL — CL (ref 8.9–10.3)
Creatinine, Ser: 0.49 mg/dL (ref 0.44–1.00)
Glucose, Bld: 90 mg/dL (ref 70–99)
Potassium: 3.3 mmol/L — ABNORMAL LOW (ref 3.5–5.1)
Sodium: 142 mmol/L (ref 135–145)
TOTAL PROTEIN: 3.9 g/dL — AB (ref 6.5–8.1)

## 2018-07-05 LAB — I-STAT BETA HCG BLOOD, ED (MC, WL, AP ONLY): HCG, QUANTITATIVE: 5.4 m[IU]/mL — AB (ref <5)

## 2018-07-05 LAB — PROTIME-INR
INR: 1.13
PROTHROMBIN TIME: 14.4 s (ref 11.4–15.2)

## 2018-07-05 LAB — I-STAT TROPONIN, ED: Troponin i, poc: 0.05 ng/mL (ref 0.00–0.08)

## 2018-07-05 LAB — TRIGLYCERIDES: TRIGLYCERIDES: 97 mg/dL (ref <150)

## 2018-07-05 LAB — CBG MONITORING, ED: Glucose-Capillary: 116 mg/dL — ABNORMAL HIGH (ref 70–99)

## 2018-07-05 LAB — APTT: aPTT: 31 seconds (ref 24–36)

## 2018-07-05 MED ORDER — MANNITOL 25 % IV SOLN
50.0000 g | INTRAVENOUS | Status: AC
  Administered 2018-07-05: 50 g via INTRAVENOUS
  Filled 2018-07-05: qty 200

## 2018-07-05 MED ORDER — SUCCINYLCHOLINE CHLORIDE 20 MG/ML IJ SOLN
INTRAMUSCULAR | Status: AC | PRN
  Administered 2018-07-05: 120 mg via INTRAVENOUS

## 2018-07-05 MED ORDER — MANNITOL 25 % IV SOLN
34.0000 g | Freq: Once | INTRAVENOUS | Status: AC
  Administered 2018-07-05: 34 g via INTRAVENOUS
  Filled 2018-07-05: qty 136

## 2018-07-05 MED ORDER — PROPOFOL 1000 MG/100ML IV EMUL
0.0000 ug/kg/min | INTRAVENOUS | Status: DC
  Administered 2018-07-05: 10 ug/kg/min via INTRAVENOUS
  Filled 2018-07-05: qty 100

## 2018-07-05 MED ORDER — SODIUM CHLORIDE 0.9 % IV SOLN
INTRAVENOUS | Status: AC | PRN
  Administered 2018-07-05: 1000 mL via INTRAVENOUS

## 2018-07-05 MED ORDER — ETOMIDATE 2 MG/ML IV SOLN
INTRAVENOUS | Status: AC | PRN
  Administered 2018-07-05: 10 mg via INTRAVENOUS

## 2018-07-05 NOTE — Code Documentation (Addendum)
Code Stroke:  46YF arrived with [facility]. LSN 1700, patient was found down on the ground in the bathroom. EMS noticed decerebrate posturing upon arrival. Upon arrival, patient was GCS 3, unresponsive, in C-collar immobilization, pupils were fixed and irregular. Patient was immediately taken to CT. CT + large left convexity, mixed age subdural hematoma, measuring up to 6.4 cm with 1.8 cm of rightward midline shift and subfalcine herniation. Patient was taken to Trauma C, RSI was performed.  I administered 50 mg of Mannitol IV over 15 minutes at 1955. After intubation, Propofol drip was started, and I administered another dose of 34 g Mannitol over 15 minutes at 2030. See MD notes for neuro deficits.   Family arrived, Neuro MD and [facility] MD did update family, chaplain present, assisting in finding support family/friends for patient's mother/father.   Plan: based off MD notes - comfort care.   Start Time 1918 Arrival Time 1921 End Time 2130

## 2018-07-05 NOTE — ED Notes (Signed)
Neurosurg at bedside

## 2018-07-05 NOTE — ED Notes (Signed)
Family at bedside. 

## 2018-07-05 NOTE — Consult Note (Signed)
Neurology Consultation Reason for Consult: SDH Referring Physician: Rosalia Fox, D  CC: Subdural hematoma  History is obtained from:patient  HPI: Alison Fox is a 47 y.o. female with a history of hydrocephalus who has seen Dr. Conchita ParisNundkumar in the past and had an offer of a third ventriculostomy but given that her symptoms was isolated headache she did not want to pursue that at that time.  She was in her normal state of health until 5 PM which point she was next found on the ground in the bathroom.  Unclear if she had trauma.  On EMS arrival, she had evidence of the posturing and therefore a code stroke was activated.  On arrival the patient was unresponsive. There is no family at bedside.     LKW: 5 PM tpa given?: no, ICH    ROS:  Unable to obtain due to altered mental status.   Past Medical History:  Diagnosis Date  . 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 Glaucoma 08/22/2014  . Headache   . 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 On 01/21/15, she saw Dr. Conchita [facility]Nundkumar at [facility].  She had a repeat MRI of the brain without contrast which demonstrated symmetric ventriculomegaly of the lateral ventricles and third ventricle without significant transependymal flow.  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  Endoscopic third ventriculostomy was offered but not insisted since her only symptoms are headache, which is now mild.  She did not wish to proceed with this procedure.  . Lymphedema    Chronic following left knee surgery in 2000.  . Varicose veins      Family History  Problem Relation Age of Onset  . Heart disease Maternal Aunt   . Heart disease Maternal Uncle   . Stroke Paternal Uncle   . Cancer Paternal Uncle        Melanoma  . Diabetes Maternal Grandmother   . Diabetes Paternal Grandfather       Social History:  reports that she has never smoked. She has never used smokeless tobacco. She reports that she does not drink alcohol or use drugs.   Exam: Current vital signs: Vitals:   06/21/2018 1950  BP: (!) 152/88  Pulse: (!) 145  Resp: (!) 25  Temp: 97.9 F (36.6 C)  SpO2: 100%    Vital signs in last 24 hours:     Physical Exam  Constitutional: Appears older than stated age Psych: unresponsive Eyes: No scleral injection HENT: No OP obstrucion Head: Normocephalic.  Cardiovascular: Normal rate and regular rhythm.  Respiratory: Effort normal, non-labored breathing GI: Soft.  No distension. There is no tenderness.  Skin: WDI EXT: cast on left leg, edema bilaterally  Neuro: Mental Status: Patient does not open eyes or follow commands. Cranial Nerves: II: She does not blink to threat. Pupils are dilated and irregular bilaterally with minimal reaction. III,IV, VI: Eyes are slightly disconjugate, no doll's eye response was elicited V: VII: corneal intact bilaterally, though slightly weaker on the right than left  Motor: Initially was withdrawing on the right at the bridge, but prior to intubation was extensor posturing x 4.  Sensory: As above Cerebellar: Does not follow commands  I have reviewed labs in epic and the results pertinent to this consultation are: Nml creatinine  I have reviewed the images obtained: CT head - Large SDH with mixed density  Impression: 47 yo  F with large SDH and clinically appears to be herniating.   Recommendations: 1) Mannitol x1 2) STAT neurosurgical consult.    This patient is critically ill and at significant risk of neurological worsening, death and care requires constant monitoring of vital signs, hemodynamics,respiratory and cardiac monitoring, neurological assessment, discussion with family, other specialists and medical decision making of high complexity. I spent 40 minutes of neurocritical care time  in the care of   this patient.  Ritta Slot, MD [facility] [phone]  If 7pm- 7am, please page neurology on call as listed in AMION. 07/17/2018  8:20 PM

## 2018-07-05 NOTE — Progress Notes (Signed)
   2018/04/04 2100  Clinical Encounter Type  Visited With Patient and family together  Visit Type Initial;Spiritual support;Social support;Patient actively dying;ED  Referral From Nurse;Physician  Spiritual Encounters  Spiritual Needs Emotional  Stress Factors  Patient Stress Factors Family relationships;Loss of control;Major life changes;Loss  Family Stress Factors Loss  Chaplain was already in ED when Doctor indicated pt in trauma room C's family would need support. Pt was discovered by mother on floor of home.  She had had many falls as of late. Family was taken to Consult B and doctor indicated that she would not make it through surgery. Chaplain continued to support family and tried to contact loved ones and their pastor for them.  Chaplain provided ministry of presence and prayer.   Rev. Lynnell ChadVirginia Zaki Gertsch

## 2018-07-05 NOTE — Consult Note (Signed)
Reason for Consult:unresponsive, last seen normal ~1700 Referring Physician: Rosalia Fox, Alison Fox  Alison Fox is an 47 y.o. female.  HPI: whom was found down in the bathroom of her home, unresponsive. Noted on initial exam at [facility] to have nonreactive pupils, which were dilated. She was not responsive and displayed extensor posturing. Neurology initially consulted for a code stroke. When subdural hematoma was identified, I was consulted.   Past Medical History:  Diagnosis Date  . 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 Glaucoma 08/22/2014  . Headache   . 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 On 01/21/15, she saw Dr. Conchita [facility]undkumar at [facility].  She had a repeat MRI of the brain without contrast which demonstrated symmetric ventriculomegaly of the lateral ventricles and third ventricle without significant transependymal flow.  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  Endoscopic third ventriculostomy was offered but not insisted since her only symptoms are headache, which is now mild.  She did not wish to proceed with this procedure.  . Lymphedema    Chronic following left knee surgery in 2000.  . Varicose veins     Past Surgical History:  Procedure Laterality Date  . EYE SURGERY    . KNEE ARTHROSCOPY  2000   Left knee.     Family History  Problem Relation Age of Onset  . Heart disease Maternal Aunt   . Heart disease Maternal Uncle   . Stroke Paternal Uncle   . Cancer Paternal Uncle        Melanoma  . Diabetes Maternal Grandmother   . Diabetes Paternal Grandfather     Social History:  reports that she has never smoked. She has never used smokeless tobacco. She reports that she does not drink alcohol or use drugs.  Allergies: No Known Allergies  Medications: I have reviewed the patient's current  medications.  Results for orders placed or performed during the hospital encounter of 07/14/2018 (from the past 48 hour(s))  CBC     Status: Abnormal   Collection Time: 06/25/2018  7:35 PM  Result Value Ref Range   WBC 11.6 (H) 4.0 - 10.5 K/uL   RBC 2.66 (Fox) 3.87 - 5.11 MIL/uL   Hemoglobin 8.0 (Fox) 12.0 - 15.0 g/dL   HCT 16.125.4 (Fox) 09.636.0 - 04.546.0 %   MCV 95.5 78.0 - 100.0 fL   MCH 30.1 26.0 - 34.0 pg   MCHC 31.5 30.0 - 36.0 g/dL   RDW 40.912.7 81.111.5 - 91.415.5 %   Platelets 159 150 - 400 K/uL    Comment: Performed at [facility], [address] [place], [place] [address]  Differential     Status: Abnormal   Collection Time: 07/11/2018  7:35 PM  Result Value Ref Range   Neutrophils Relative % 85 %   Neutro Abs 9.8 (H) 1.7 - 7.7 K/uL   Lymphocytes Relative 7 %   Lymphs Abs 0.8 0.7 - 4.0 K/uL   Monocytes Relative 7 %   Monocytes Absolute 0.8 0.1 - 1.0 K/uL   Eosinophils Relative 0 %   Eosinophils Absolute 0.0 0.0 - 0.7 K/uL   Basophils Relative 0 %   Basophils Absolute 0.0 0.0 - 0.1 K/uL   Immature Granulocytes 1 %   Abs Immature Granulocytes 0.1 0.0 - 0.1 K/uL    Comment: Performed at [facility], [address] [place], [place]  [address]  I-stat troponin, ED     Status: None   Collection Time: 07/04/2018  7:55 PM  Result Value Ref Range   Troponin i, poc 0.05 0.00 - 0.08 ng/mL   Comment 3            Comment: Due to the release kinetics of cTnI, a negative result within the first hours of the onset of symptoms does not rule out myocardial infarction with certainty. If myocardial infarction is still suspected, repeat the test at appropriate intervals.   I-Stat beta hCG blood, ED     Status: Abnormal   Collection Time: 07/12/2018  7:55 PM  Result Value Ref Range   I-stat hCG, quantitative 5.4 (H) <5 mIU/mL   Comment 3            Comment:   GEST. AGE      CONC.  (mIU/mL)   <=1 WEEK        5 - 50     2 WEEKS       50 - 500     3 WEEKS       100 - 10,000     4 WEEKS     1,000 -  30,000        FEMALE AND NON-PREGNANT FEMALE:     LESS THAN 5 mIU/mL   I-Stat Chem 8, ED     Status: Abnormal   Collection Time: 06/26/2018  7:57 PM  Result Value Ref Range   Sodium 134 (Fox) 135 - 145 mmol/Fox   Potassium 4.3 3.5 - 5.1 mmol/Fox   Chloride 104 98 - 111 mmol/Fox   BUN 17 6 - 20 mg/dL   Creatinine, Ser 6.04 0.44 - 1.00 mg/dL   Glucose, Bld 540 (H) 70 - 99 mg/dL   Calcium, Ion 9.81 (LL) 1.15 - 1.40 mmol/Fox   TCO2 26 22 - 32 mmol/Fox   Hemoglobin 11.2 (Fox) 12.0 - 15.0 g/dL   HCT 19.1 (Fox) 47.8 - 29.5 %   Comment NOTIFIED PHYSICIAN     Dg Chest Portable 1 View  Result Date: 06/19/2018 CLINICAL DATA:  Acute respiratory failure.  Post intubation. EXAM: PORTABLE CHEST 1 VIEW COMPARISON:  02/27/2014 FINDINGS: Endotracheal tube and nasogastric tube are seen in appropriate position. Heart size is normal. No evidence of pneumothorax. Atelectasis or infiltrate is seen at the left lung base which is new since previous study. Right lung remains clear. IMPRESSION: Left basilar atelectasis versus infiltrate. Electronically Signed   By: Myles Rosenthal M.D.   On: 06/25/2018 20:41   Ct Head Code Stroke Wo Contrast  Result Date: 07/16/2018 CLINICAL DATA:  Code stroke.  Altered mental status and posturing EXAM: CT HEAD WITHOUT CONTRAST TECHNIQUE: Contiguous axial images were obtained from the base of the skull through the vertex without intravenous contrast. COMPARISON:  Head CT 09/10/2014 FINDINGS: Brain: There is a massive left convexity subdural hematoma measuring up to 6.4 cm in thickness. Heterogeneity of the blood product density is consistent with hemorrhage of multiple ages. There is rightward subfalcine herniation with midline shift of approximately 1.8 cm. There is crowding of the basal cisterns. The right lateral ventricle remains markedly dilated. The left lateral ventricle is nearly effaced. Vascular: No abnormal hyperdensity of the major intracranial arteries or dural venous sinuses. No intracranial  atherosclerosis. Skull: The visualized skull base, calvarium and extracranial soft tissues are normal. Sinuses/Orbits: Complete opacification of the left frontal sinus. The orbits are normal. IMPRESSION: 1. Large left convexity, mixed age subdural hematoma,  measuring up to 6.4 cm with 1.8 cm of rightward midline shift and subfalcine herniation. 2. ASPECTS is not reported in the context of acute hemorrhage Critical Value/emergent results were called by telephone at the time of interpretation on 06/29/2018 at 7:46 pm to Dr. Ritta Slot, who verbally acknowledged these results. Electronically Signed   By: Deatra Robinson M.D.   On: 07/09/2018 19:47    Review of Systems  Unable to perform ROS: Patient unresponsive   Blood pressure 131/69, pulse (!) 129, temperature 97.9 F (36.6 C), resp. rate 17, weight 84.1 kg, SpO2 100 %. Physical Exam  Constitutional:  Appears older than stated age  Eyes:  Pupils fixed and dilated  Neck:  In cervical collar  Respiratory:  intubated  GI: Soft.  Neurological: She is unresponsive. A cranial nerve deficit is present. GCS eye subscore is 1. GCS motor subscore is 2.  Unable to assess strength Extensor posturing +cough, +corneals   Skin: Skin is warm and dry.    Assessment/Plan: I have spoken with Alison Fox's mother and father. I do not believe given the size of the subdural, the left to right shift, the transtentorial herniation, brainstem damage, ventricular effacement, and effacement of the basal cisterns, that a good outcome is possible. I believe surgery to remove the clot at this time is a futile endeavor. I have explained this to the parents, and they at this time do not wish to pursue a course other than comfort care.   Alison Fox 07/04/2018, 8:51 PM

## 2018-07-05 NOTE — ED Provider Notes (Addendum)
[facility] EMERGENCY DEPARTMENT Provider Note   CSN: [id number] Arrival date & time: 07/02/2018  1929   An emergency department physician performed an initial assessment on this suspected stroke patient at 72.  History   Chief Complaint Chief Complaint  Patient presents with  . Code Stroke    HPI Alison Fox is a 47 y.o. female. 5 caveat secondary to altered mental status HPI  47 year old female who presents around the EMS with reports of altered mental status 2 and half hours.  She was found on the floor in the bathroom.  Due to sudden onset of altered mental status patient was fine as code stroke. Patient met at bridge with neurologist, Dr. Leonel Ramsay.  Patient felt to be protecting airway enough to go to ct scan.   Past Medical History:  Diagnosis Date  . 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 Glaucoma 08/22/2014  . Headache   . 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 On 01/21/15, she saw Dr. Kathyrn Sheriff at [facility].  She had a repeat MRI of the brain without contrast which demonstrated symmetric ventriculomegaly of the lateral ventricles and third ventricle without significant transependymal flow.  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  Endoscopic third ventriculostomy was offered but not insisted since her only symptoms are headache, which is now mild.  She did not wish to proceed with this procedure.  . Lymphedema    Chronic following left knee surgery in 2000.  . Varicose veins     Patient Active Problem List   Diagnosis Date Noted  . Venous stasis ulcer of right calf ([place]) 03/09/2018  . Mitral regurgitation 12/30/2017  . Sinus tachycardia 11/29/2017  . Episodic tension-type headache, not intractable 07/22/2015  . Hydrocephalus, adult 09/10/2014  . Glaucoma  08/22/2014  . Preventative health care 05/18/2011  . Lymphedema 08/06/2006    Past Surgical History:  Procedure Laterality Date  . EYE SURGERY    . KNEE ARTHROSCOPY  2000   Left knee.      OB History   None      Home Medications    Prior to Admission medications   Medication Sig Start Date End Date Taking? Authorizing Provider  nortriptyline (PAMELOR) 50 MG capsule Take 2 capsules (100 mg total) by mouth at bedtime. 01/12/18   Pieter Partridge, DO  Olopatadine HCl 0.2 % SOLN Place 1 drop into both eyes every morning. 03/10/17   [provider]  tizanidine (ZANAFLEX) 2 MG capsule Take 1 capsule (2 mg total) by mouth at bedtime. 06/27/18   Pieter Partridge, DO    Family History Family History  Problem Relation Age of Onset  . Heart disease Maternal Aunt   . Heart disease Maternal Uncle   . Stroke Paternal Uncle   . Cancer Paternal Uncle        Melanoma  . Diabetes Maternal Grandmother   . Diabetes Paternal Grandfather     Social History Social History   Tobacco Use  . Smoking status: Never Smoker  . Smokeless tobacco: Never Used  Substance Use Topics  . Alcohol use: No    Alcohol/week: 0.0 standard drinks  . Drug use: No     Allergies   Patient has no known allergies.   Review of Systems Review of Systems  Unable to perform ROS: Mental status change     Physical  Exam Updated Vital Signs BP (!) 160/88   Pulse (!) 131   Temp 97.9 F (36.6 C)   Resp 17   Wt 84.1 kg   SpO2 100%   BMI 27.78 kg/m   Physical Exam  Constitutional: She appears distressed.  HENT:  Head: Atraumatic.  Right Ear: External ear normal.  Left Ear: External ear normal.  Mouth/Throat: Oropharynx is clear and moist.  Eyes:  Pupils are large and nonreactive  Neck: Normal range of motion.  Cardiovascular:  tachycardia  Pulmonary/Chest: Effort normal and breath sounds normal.  Abdominal: Soft.  Musculoskeletal:  Bilateral lower extremity splints in place  Neurological:   Unresponsive with bilateral decerebrate posturing  Skin: Skin is warm. Capillary refill takes less than 2 seconds.  Nursing note and vitals reviewed.    ED Treatments / Results  Labs (all labs ordered are listed, but only abnormal results are displayed) Labs Reviewed  I-STAT CHEM 8, ED - Abnormal; Notable for the following components:      Result Value   Sodium 134 (*)    Glucose, Bld 131 (*)    Calcium, Ion 0.83 (*)    Hemoglobin 11.2 (*)    HCT 33.0 (*)    All other components within normal limits  CBC  DIFFERENTIAL  COMPREHENSIVE METABOLIC PANEL  TRIGLYCERIDES  I-STAT TROPONIN, ED  CBG MONITORING, ED  I-STAT BETA HCG BLOOD, ED ([facility], [facility], [facility] ONLY)    EKG None  Radiology Ct Head Code Stroke Wo Contrast  Result Date: 06/24/2018 CLINICAL DATA:  Code stroke.  Altered mental status and posturing EXAM: CT HEAD WITHOUT CONTRAST TECHNIQUE: Contiguous axial images were obtained from the base of the skull through the vertex without intravenous contrast. COMPARISON:  Head CT 09/10/2014 FINDINGS: Brain: There is a massive left convexity subdural hematoma measuring up to 6.4 cm in thickness. Heterogeneity of the blood product density is consistent with hemorrhage of multiple ages. There is rightward subfalcine herniation with midline shift of approximately 1.8 cm. There is crowding of the basal cisterns. The right lateral ventricle remains markedly dilated. The left lateral ventricle is nearly effaced. Vascular: No abnormal hyperdensity of the major intracranial arteries or dural venous sinuses. No intracranial atherosclerosis. Skull: The visualized skull base, calvarium and extracranial soft tissues are normal. Sinuses/Orbits: Complete opacification of the left frontal sinus. The orbits are normal. IMPRESSION: 1. Large left convexity, mixed age subdural hematoma, measuring up to 6.4 cm with 1.8 cm of rightward midline shift and subfalcine herniation. 2. ASPECTS is not reported in the context  of acute hemorrhage Critical Value/emergent results were called by telephone at the time of interpretation on 06/28/2018 at 7:46 pm to Dr. Roland Rack, who verbally acknowledged these results. Electronically Signed   By: Ulyses Jarred M.D.   On: 07/07/2018 19:47    Procedures Procedure Name: Intubation Date/Time: 07/18/2018 8:00 PM Performed by: Pattricia Boss, MD Pre-anesthesia Checklist: Patient identified, Patient being monitored, Emergency Drugs available, Timeout performed and Suction available Oxygen Delivery Method: Non-rebreather mask Preoxygenation: Pre-oxygenation with 100% oxygen Induction Type: Rapid sequence Ventilation: Mask ventilation without difficulty Tube size: 7.5 mm Number of attempts: 2 Placement Confirmation: ETT inserted through vocal cords under direct vision,  CO2 detector,  Breath sounds checked- equal and bilateral and Positive ETCO2 Secured at: 25 cm Tube secured with: ETT holder Dental Injury: Teeth and Oropharynx as per pre-operative assessment  Comments: postintubation cxr with good tube position    .Critical Care Performed by: Pattricia Boss, MD Authorized by: Pattricia Boss,  MD   Critical care provider statement:    Critical care time (minutes):  45   Critical care end time:  07/15/2018 8:39 PM   Critical care was time spent personally by me on the following activities:  Discussions with consultants, evaluation of patient's response to treatment, examination of patient, ordering and performing treatments and interventions, ordering and review of laboratory studies, ordering and review of radiographic studies, pulse oximetry, re-evaluation of patient's condition, obtaining history from patient or surrogate and review of old charts   (including critical care time)  Medications Ordered in ED Medications  propofol (DIPRIVAN) 1000 MG/100ML infusion (has no administration in time range)  mannitol 25 % injection 50 g (50 g Intravenous New Bag/Given  07/08/2018 1955)  etomidate (AMIDATE) injection (10 mg Intravenous Given 07/15/2018 1957)  succinylcholine (ANECTINE) injection (120 mg Intravenous Given 07/11/2018 1957)  0.9 %  sodium chloride infusion (1,000 mLs Intravenous New Bag/Given 07/08/2018 1957)     Initial Impression / Assessment and Plan / ED Course  I have reviewed the triage vital signs and the nursing notes.  Pertinent labs & imaging results that were available during my care of the patient were reviewed by me and considered in my medical decision making (see chart for details).    47 yo female presents with ams, decerebrate posturing, large sdh seen on head ct.  Mannitol being given by Dr. Leonel Ramsay. Intubation done here in ED Dr. Cyndy Freeze consulted for neurosurgery and is at bedside.  Reviewed Dr. Lebron Quam l and patient to be made comfort care Discussed with Dr. Vanetta Shawl and will see for admission  Final Clinical Impressions(s) / ED Diagnoses   Final diagnoses:  SDH (subdural hematoma) [facility])    ED Discharge Orders    None       Pattricia Boss, MD 06/27/2018 5749    Pattricia Boss, MD 06/28/2018 2314

## 2018-07-06 DIAGNOSIS — W1830XA Fall on same level, unspecified, initial encounter: Secondary | ICD-10-CM | POA: Diagnosis present

## 2018-07-06 DIAGNOSIS — Z9181 History of falling: Secondary | ICD-10-CM | POA: Diagnosis not present

## 2018-07-06 DIAGNOSIS — G911 Obstructive hydrocephalus: Secondary | ICD-10-CM | POA: Diagnosis not present

## 2018-07-06 DIAGNOSIS — S065X9A Traumatic subdural hemorrhage with loss of consciousness of unspecified duration, initial encounter: Secondary | ICD-10-CM | POA: Diagnosis not present

## 2018-07-06 DIAGNOSIS — I83012 Varicose veins of right lower extremity with ulcer of calf: Secondary | ICD-10-CM | POA: Diagnosis present

## 2018-07-06 DIAGNOSIS — J9691 Respiratory failure, unspecified with hypoxia: Secondary | ICD-10-CM | POA: Diagnosis not present

## 2018-07-06 DIAGNOSIS — S065XAA Traumatic subdural hemorrhage with loss of consciousness status unknown, initial encounter: Secondary | ICD-10-CM | POA: Diagnosis present

## 2018-07-06 DIAGNOSIS — F4325 Adjustment disorder with mixed disturbance of emotions and conduct: Secondary | ICD-10-CM | POA: Diagnosis present

## 2018-07-06 DIAGNOSIS — R402113 Coma scale, eyes open, never, at hospital admission: Secondary | ICD-10-CM | POA: Diagnosis not present

## 2018-07-06 DIAGNOSIS — R509 Fever, unspecified: Secondary | ICD-10-CM | POA: Diagnosis not present

## 2018-07-06 DIAGNOSIS — Z8249 Family history of ischemic heart disease and other diseases of the circulatory system: Secondary | ICD-10-CM | POA: Diagnosis not present

## 2018-07-06 DIAGNOSIS — G935 Compression of brain: Secondary | ICD-10-CM | POA: Diagnosis not present

## 2018-07-06 DIAGNOSIS — Z823 Family history of stroke: Secondary | ICD-10-CM | POA: Diagnosis not present

## 2018-07-06 DIAGNOSIS — R000 Tachycardia, unspecified: Secondary | ICD-10-CM | POA: Diagnosis not present

## 2018-07-06 DIAGNOSIS — Z515 Encounter for palliative care: Secondary | ICD-10-CM | POA: Diagnosis not present

## 2018-07-06 DIAGNOSIS — I890 Lymphedema, not elsewhere classified: Secondary | ICD-10-CM | POA: Diagnosis present

## 2018-07-06 DIAGNOSIS — Z79899 Other long term (current) drug therapy: Secondary | ICD-10-CM | POA: Diagnosis not present

## 2018-07-06 DIAGNOSIS — Z66 Do not resuscitate: Secondary | ICD-10-CM | POA: Diagnosis not present

## 2018-07-06 DIAGNOSIS — H409 Unspecified glaucoma: Secondary | ICD-10-CM | POA: Diagnosis not present

## 2018-07-06 DIAGNOSIS — Z833 Family history of diabetes mellitus: Secondary | ICD-10-CM | POA: Diagnosis not present

## 2018-07-06 DIAGNOSIS — R402323 Coma scale, best motor response, extension, at hospital admission: Secondary | ICD-10-CM | POA: Diagnosis not present

## 2018-07-06 DIAGNOSIS — I340 Nonrheumatic mitral (valve) insufficiency: Secondary | ICD-10-CM | POA: Diagnosis present

## 2018-07-06 LAB — MRSA PCR SCREENING: MRSA by PCR: POSITIVE — AB

## 2018-07-06 MED ORDER — MUPIROCIN 2 % EX OINT: 1.0000 "application " | TOPICAL_OINTMENT | Freq: Two times a day (BID) | CUTANEOUS | Status: DC

## 2018-07-06 MED ORDER — ORAL CARE MOUTH RINSE
15.0000 mL | OROMUCOSAL | Status: DC
  Administered 2018-07-06 (×4): 15 mL via OROMUCOSAL

## 2018-07-06 MED ORDER — FENTANYL 2500MCG IN NS 250ML (10MCG/ML) PREMIX INFUSION
25.0000 ug/h | INTRAVENOUS | Status: DC
  Administered 2018-07-06: 50 ug/h via INTRAVENOUS

## 2018-07-06 MED ORDER — FENTANYL CITRATE (PF) 100 MCG/2ML IJ SOLN
50.0000 ug | Freq: Once | INTRAMUSCULAR | Status: AC
  Administered 2018-07-06: 50 ug via INTRAVENOUS

## 2018-07-06 MED ORDER — MORPHINE SULFATE (PF) 4 MG/ML IV SOLN: 4.0000 mg | Freq: Once | INTRAVENOUS | Status: AC

## 2018-07-06 MED ORDER — FENTANYL BOLUS VIA INFUSION
50.0000 ug | INTRAVENOUS | Status: DC | PRN
  Filled 2018-07-06: qty 50

## 2018-07-06 MED ORDER — MORPHINE SULFATE (PF) 4 MG/ML IV SOLN
INTRAVENOUS | Status: AC
  Administered 2018-07-06: 13:00:00
  Filled 2018-07-06: qty 1

## 2018-07-06 MED ORDER — LORAZEPAM 2 MG/ML IJ SOLN: 1.0000 mg | INTRAMUSCULAR | Status: DC | PRN

## 2018-07-06 MED ORDER — CHLORHEXIDINE GLUCONATE 0.12% ORAL RINSE (MEDLINE KIT)
15.0000 mL | Freq: Two times a day (BID) | OROMUCOSAL | Status: DC
  Administered 2018-07-06 (×2): 15 mL via OROMUCOSAL

## 2018-07-06 MED ORDER — CHLORHEXIDINE GLUCONATE CLOTH 2 % EX PADS: 6.0000 | MEDICATED_PAD | Freq: Every day | CUTANEOUS | Status: DC

## 2018-07-06 MED ORDER — GLYCOPYRROLATE 0.2 MG/ML IJ SOLN: 0.4000 mg | Freq: Four times a day (QID) | INTRAMUSCULAR | Status: DC | PRN

## 2018-07-06 MED ORDER — MORPHINE SULFATE (PF) 4 MG/ML IV SOLN
4.0000 mg | INTRAVENOUS | Status: DC | PRN
  Administered 2018-07-06 (×2): 4 mg via INTRAVENOUS
  Filled 2018-07-06 (×2): qty 1

## 2018-07-07 NOTE — Progress Notes (Signed)
Nurse requested chaplain to come and be with family as their daughter had passed away.  Chaplain came and prayed with them and listened. Mother and father were exhausted-mother had been there since day earlier.  Father expressed he felt sick and chaplain and family friends convinced him to sit down to avoid having a fall or anything,  Renato Gailsastor of family came up to be with them.  Shared the funeral home information with the nurse and the pastor when he arrived. [employer]  [address] [phone].  The pastor plans to wait with them and take them home when they are ready-the father does not drive and the mother is likely too tired from being here so long.  Family ex[ressed thanks to the chaplain who was with them last night and for the work the chaplains do. Phebe CollaDonna S Sallye Lunz, Chaplain   07/07/18 0000  Clinical Encounter Type  Visited With Patient and family together  Visit Type Death  Referral From Nurse  Consult/Referral To Chaplain  Spiritual Encounters  Spiritual Needs Prayer;Emotional;Grief support  Stress Factors  Patient Stress Factors Loss;Major life changes  Family Stress Factors Exhausted;Loss

## 2018-07-07 NOTE — Discharge Summary (Signed)
  Name: Alison LewandowskyFrances J Dickison MRN: [id number] DOB: [birth year] 46 y.o.  Date of Admission: 07/04/2018  7:31 PM Date of Discharge: 05/10/18 Attending Physician: Dr. Oswaldo DoneVincent Discharge Diagnosis: Active Problems:   End of life care   Tachycardia   SDH (subdural hematoma) (HCC)   Hypocalcemia   Fever, unspecified   Subdural hematoma (HCC)  Cause of death: Respiratory failure secondary to brain herniation caused by large subdural hematoma Time of death: 23:30  Disposition and follow-up:   [name] [facility].    Hospital Course: [name] is a 47 yo woman with medical history of hydrocephalus, migraines, glaucoma, mitral regurgitation, and chronic lower extremity lymphedema who presented to the hospital after she was found down and unresponsive by her parents. She had been in her normal state of health previously. Her parents report that she had been falling often, most likely due to her lower extremity lymphedema. Upon arrival to the hospital, she was afebrile and hemodynamically stable with a GCS of 2. Neurological exam showed fixed and dilated pupils and extensor posturing with present cough and corneal reflexes. She was intubated and CT head revealed a mixed age subdural hematoma measuring up to 6.4 cm with 1.8 cm of rightward midline shift and subfalcine herniation. She was seen by neurology and neurosurgery who both agreed that the hemorrhage was irreversible and fatal. Her parents were present and her prognosis was explained to them. She was admitted to the hospital and made comfort care. Her parents spent the morning saying goodbye to her surrounded by friends and their chaplain. She was extubated at 1pm on 9/18 and passed from hypoxic respiratory failure at 11:30pm.   Signed: Rakayla Ricklefs, Cathleen Cortieborah N, MD 07/07/2018, 4:16 PM

## 2018-07-19 NOTE — Progress Notes (Signed)
   07-07-18 1600  Clinical Encounter Type  Visited With Patient and family together  Visit Type Follow-up  Referral From Nurse  Spiritual Encounters  Spiritual Needs Emotional  Stress Factors  Family Stress Factors Loss  Responded to [facility] consult for end of life. Met mother and a few friends of family. Chaplain shared encouraging words with mother and offered prayer. Mother welcomed prayer and chaplain provided emotional support as well.

## 2018-07-19 NOTE — Progress Notes (Signed)
Palliative Medicine RN Note: Consult order noted for compassionate extubation for today.  Due to high referral volume, there will be a delay seeing this patient; will likely won't have an available provider until 9/19 or 9/20. I have paged Teaching Service to let them know.   Please call the Palliative Medicine Team office at [phone] if recommendations are needed in the interim.  Thank you for inviting us to see this patient.  Margret ChanceMelanie G. Angeles Paolucci, RN, BSN, [facility] Palliative Medicine Team 06/21/2018 8:51 AM Office [phone]

## 2018-07-19 NOTE — Progress Notes (Signed)
   47 year old woman admitted last night with a devastating subdural hematoma. She was found yesterday afternoon in the bathroom unresponsive by her mother. She was mostly in her usual state of health leading up to this, she did describe some discomfort about 1 hour prior to the event to her father. Alison Fox has chronic idiopathic lymphedema which causes her to lose balance and fall. Mother reported several falls over the last several weeks. She also has chronic hydrocephalus, which has been stable, and did not previously require shunting. I think she likely had a fall which caused this SDH.   Exam:  Currently intubated and sedated with Fentanyl at 3450mcg/hr. Vent settings are VC with 40% FiO2, RR 15, Peep 5. She is not initiating any breaths at current setting.  She postures with pain. Pupils are dilated with no response to light, no doll's eye response. I cannot elicit a corneal reflex. Nurse tells me she does have a gag reflex to suction.  Lungs are clear bilaterally. Heart RRR. Extremities are warm.  Plan: 47 year old woman with devastating left subdural hemorrhage which will end her life. I greatly appreciate Dr. Franky Machoabbell with neurosurgery evaluation last night. Given the extent of damage there is no hope for benefit from surgery or other medical intervention. Currently she is on comfort care measures. I updated her mother and father this morning at the bedside. I advised they spend this morning with Alison Fox, and call any other family and friends in the area who may want to say goodbye to her. We will plan for extubation this afternoon around 1pm. Family is in agreement. Prognosis after extubation is likely minutes to hours. I have spoken with our organ donation person who will talk with the family later this morning.

## 2018-07-19 NOTE — Consult Note (Signed)
Consultation Note Date: 11-Jul-2018   Patient Name: Alison Fox  DOB: 23-Dec-1970  MRN: [id number]  Age / Sex: 47 y.o., female  PCP: Axel Filler, MD Referring Physician: Axel Filler, *  Reason for Consultation: Terminal Care  HPI/Patient Profile: 47 y.o. female admitted on 06/29/2018   Clinical Assessment and Goals of Care:  47 year old female brought to the hospital after she was found unresponsive, seen by neurosurgery, brain imaging showed a large subdural bleed, left-to-right shift, transtentorial herniation, brainstem damage, effacement of basal cisterns as well as ventricular effacement noted on brain imaging. Patient was intubated for airway protection. Surgery for port removal was deemed to be a futile endeavor because of extensive bleed. Patient was compassionately liberated from the ventilator earlier today on 07/11/18.  Palliative medicine consultation for providing supportive care to the family, continuation of terminal care and comfort measures.  Patient is a young female resting in bed. She is unresponsive. Her parents are present at the bedside. Also met with patient's pastor outside the patient's room. Discussed with bedside RN. Patient has tachycardia, does not have nonverbal gestures of distress or discomfort: does not appear to have wincing, no grimacing, no restless movements, no clenching of the jaw, no firming of the brow noted. She does not yet appear to have any coolness or mottling of upper or lower extremities.  I met with the patient's parents who were present in the room. Couple of family friends were also present. Introduced myself and palliative care as follows: Palliative medicine is specialized medical care for people living with serious illness. It focuses on providing relief from the symptoms and stress of a serious illness. The goal is to improve quality of  life for both the patient and the family.  Discussed gently and compassionately with patient's parents especially the patient's mother about full scope of comfort measures. End-of-life signs and symptoms discussed in detail. Offered support and presence. See additional discussions/recommendations as listed below. Thank you for the consult.  NEXT OF KIN  mother and father were present at the bedside  SUMMARY OF RECOMMENDATIONS    DO NOT RESUSCITATE, extubated earlier today, comfort measures only Continue morphine IV when necessary Add Ativan IV when necessary for restlessness or seizures to be used on an as-needed basis Add Robinul IV when necessary to be used as needed for secretions Comfort cart for family if it can be arranged Chaplain consult to provide an extra layer of support for family Anticipated hospital death: Prognosis hours - some very limited number of days at this point RN may pronounce death.  Code Status/Advance Care Planning:  DNR    Symptom Management:    see above  Palliative Prophylaxis:   Oral Care  Additional Recommendations (Limitations, Scope, Preferences):  Full Comfort Care  Psycho-social/Spiritual:   Desire for further Chaplaincy support:yes  Additional Recommendations: Caregiving  Support/Resources and Grief/Bereavement Support  Prognosis:   Hours - Days  Discharge Planning: Anticipated Hospital Death      Primary Diagnoses: Present on Admission: . Subdural  hematoma ([place])   I have reviewed the medical record, interviewed the patient and family, and examined the patient. The following aspects are pertinent.  Past Medical History:  Diagnosis Date  . 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 Glaucoma 08/22/2014  . Headache   . 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 On 01/21/15, she saw Dr.  Kathyrn Sheriff at [facility].  She had a repeat MRI of the brain without contrast which demonstrated symmetric ventriculomegaly of the lateral ventricles and third ventricle without significant transependymal flow.  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  Endoscopic third ventriculostomy was offered but not insisted since her only symptoms are headache, which is now mild.  She did not wish to proceed with this procedure.  . Lymphedema    Chronic following left knee surgery in 2000.  . Varicose veins    Social History   Socioeconomic History  . Marital status: Single    Spouse name: Not on file  . Number of children: Not on file  . Years of education: Not on file  . Highest education level: Not on file  Occupational History  . Not on file  Social Needs  . Financial resource strain: Not on file  . Food insecurity:    Worry: Not on file    Inability: Not on file  . Transportation needs:    Medical: Not on file    Non-medical: Not on file  Tobacco Use  . Smoking status: Never Smoker  . Smokeless tobacco: Never Used  Substance and Sexual Activity  . Alcohol use: No    Alcohol/week: 0.0 standard drinks  . Drug use: No  . Sexual activity: Never  Lifestyle  . Physical activity:    Days per week: Not on file    Minutes per session: Not on file  . Stress: Not on file  Relationships  . Social connections:    Talks on phone: Not on file    Gets together: Not on file    Attends religious service: Not on file    Active member of club or organization: Not on file    Attends meetings of clubs or organizations: Not on file    Relationship status: Not on file  Other Topics Concern  . Not on file  Social History Narrative  . Not on file   Family History  Problem Relation Age of Onset  . Heart disease Maternal Aunt   . Heart disease Maternal Uncle   . Stroke Paternal Uncle   . Cancer Paternal Uncle        Melanoma  . Diabetes Maternal  Grandmother   . Diabetes Paternal Grandfather    Scheduled Meds: . mupirocin ointment  1 application Nasal BID   Continuous Infusions: . fentaNYL infusion INTRAVENOUS Stopped (2018/07/10 0916)   PRN Meds:.glycopyrrolate, LORazepam, morphine injection Medications Prior to Admission:  Prior to Admission medications   Medication Sig Start Date End Date Taking? Authorizing Provider  nortriptyline (PAMELOR) 50 MG capsule Take 2 capsules (100 mg total) by mouth at bedtime. 01/12/18  Yes Jaffe, Adam R, DO  tizanidine (ZANAFLEX) 2 MG capsule Take 1 capsule (2 mg total) by mouth at bedtime. 06/27/18  Yes Tomi Likens, Adam R, DO  Olopatadine HCl 0.2 % SOLN Place 1 drop into both eyes every morning. 03/10/17   [provider]   No Known Allergies Review of Systems  Non verbal  Physical Exam Young female recently extubated resting in bed Coarse noisy breathing Extremities warm to touch no coolness no mottling Unresponsive Shallow regular pattern of breathing Tachycardic Trace edema Abdomen is not distended  Vital Signs: BP 113/63   Pulse (!) 112   Temp (!) 104.2 F (40.1 C)   Resp 15   Wt 84.1 kg   SpO2 100%   BMI 27.78 kg/m  Pain Scale: CPOT       SpO2: SpO2: 100 % O2 Device:SpO2: 100 % O2 Flow Rate: .   IO: Intake/output summary:   Intake/Output Summary (Last 24 hours) at Jul 13, 2018 1435 Last data filed at 2018/07/13 1300 Gross per 24 hour  Intake 43.04 ml  Output 2375 ml  Net -2331.96 ml    LBM:   Baseline Weight: Weight: 84.1 kg Most recent weight: Weight: 84.1 kg     Palliative Assessment/Data:   PPS 10%  Time In:  1330 Time Out:  1430 Time Total:  60 min  Greater than 50%  of this time was spent counseling and coordinating care related to the above assessment and plan.  Signed by: Loistine Chance, MD [phone]   Please contact Palliative Medicine Team phone at [phone] for questions and concerns.  For individual provider: See  Shea Evans

## 2018-07-19 NOTE — ED Notes (Signed)
$  40 found on patient.  1 twenty dollar bill, 4 five dollar bills.  All placed with security.

## 2018-07-19 NOTE — Progress Notes (Signed)
CDS contacted   Ref #  [id number]

## 2018-07-19 NOTE — ED Notes (Signed)
Patient belongings sheet from security sent up to 4N ICU.  Reference number [id number]

## 2018-07-19 NOTE — Procedures (Signed)
Extubation Procedure Note  Patient Details:   Name: Alison Fox DOB: 1970-12-02 MRN: [id number]   Airway Documentation:    Vent end date: 07/14/2018 Vent end time: 1315   Evaluation  O2 sats: one way extubation per family wishes. Complications: Complications of one way extubation per family wishes. Patient did tolerate procedure well. Bilateral Breath Sounds: Clear, Diminished   No   Patient terminally extubated per family wishes at 13:15. RT will continue to monitor.   Hayde Kilgour Lajuana RippleM Dymond Spreen 07/16/2018, 1:16 PM

## 2018-07-19 NOTE — Progress Notes (Addendum)
[facility] INTERVAL PROGRESS NOTE - Transfer of Care  [facility] earlier this AM that Ms. Alison Fox([facility] pt, PCP Dr. Oswaldo DoneVincent) was inadvertently admitted as an unassigned patient to their service overnight. Ms. Alison Fox was admitted after being found down by family members. She was unresponsive (GCS 3) with dilated, non-reactive pupils and extensor posturing on arrival. She was intubated and taken for STAT CT which revealed a large subdural hematoma measuring up-to 6.4cm with 1.8cm left-right shift, transtentorial herniation, brainstem damage, ventricular effacement and effacement of basal cisterns. She was given IV Mannitol 50g with repeat dose of 34g approximately 30 minutes later. Neurosurgery was consulted although any intervention, including surgery to remove the clot, is felt to be a futile endeavor. Parents have been involved in GOC discussions since admission and are interested in comfort care measures, although are having a challenging time proceeding with terminal extubation. CCM was requested to manage patient while intubated however ultimately declined given futility and comfort-based approach. Need to clarify if they are assisting with vent management.   Vitals at time of transfer: Temperature 104.2*, pulse 121 bpm, BP 130/68 and she remains on the ventilator. Will accept care of Alison Fox this morning with attending Dr. Oswaldo DoneVincent who also is patients PCP, which should provide some comfort to the family. Will see patient immediately this morning and discuss plan of care with family and RN.   First Contact: Dr. Avie Arenasorrell Pager = [phone] Second Contact: Dr. Crista ElliotHarbrecht Pager = [phone]  Noemi ChapelBethany Shawntee Mainwaring, DO Internal Medicine Resident - PGY3 Pager [phone]

## 2018-07-19 NOTE — H&P (Signed)
[facility] History and Physical Service Pager: [phone]  Patient name: Alison LewandowskyFrances J Brindle Medical record number: [id number] Date of birth: [birth year] Age: 47 y.o. Gender: female  Primary Care Provider: Tyson AliasVincent, Duncan Thomas, MD Consultants: chaplain Code Status: DNR  Chief Complaint: unresponsive  Assessment and Plan: Alison LewandowskyFrances J Wilmott is a 47 y.o. female presenting with subdural hematoma. PMH is significant for hydrocephalus, glaucoma, mitral regurgitation, lymphedema  Large subacute subdural hematoma- patient poor surgical candidate given size of bleed. Plan with family at bedside is to extubate once they feel ready. Did discuss with them the possibility she may not survive even with the ventilator. They understand. Family pastor at bedside providing support along with hospital chaplain -will admit to ICU as patient is on ventilator -avoid monitoring vitals, blood draws -comfort measures -extubation once family ready -discussed with CCM- they will follow for ventilator needs although there is no plan to wean patient or change vent settings, only to withdraw care -appreciate chaplain support  FEN/GI: NPO Prophylaxis: none  Disposition: ICU  History of Present Illness:  Alison LewandowskyFrances J Alison Fox is a 47 y.o. female presenting after being found down by family members. She was found to have a large subdural hematoma. She is intubated. Mother and step father at bedside along with hospital chaplain and family's pastor. Family express understanding that there is no intervention possible for Alison CalicoFrances and no meaningful recovery from this. They have discussed extubation at length with pastor and doctors in ED. Mother is not ready at this time to withdraw care. She is planning to make the decision in the morning to do so.  Review Of Systems: Per HPI with the following additions:   Review of Systems  Unable to perform ROS: Critical illness   Patient Active  Problem List   Diagnosis Date Noted  . Subdural hematoma (HCC) 2018/10/11  . Venous stasis ulcer of right calf (HCC) 03/09/2018  . Mitral regurgitation 12/30/2017  . Sinus tachycardia 11/29/2017  . Episodic tension-type headache, not intractable 07/22/2015  . Hydrocephalus, adult 09/10/2014  . Glaucoma 08/22/2014  . Preventative health care 05/18/2011  . Lymphedema 08/06/2006    Past Medical History: Past Medical History:  Diagnosis Date  . 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 Glaucoma 08/22/2014  . Headache   . 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 On 01/21/15, [facility]  She had a repeat MRI of the brain without contrast which demonstrated symmetric ventriculomegaly of the lateral ventricles and third ventricle without significant transependymal flow.  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  Endoscopic third ventriculostomy was offered but not insisted since her only symptoms are headache, which is now mild.  She did not wish to proceed with this procedure.  . Lymphedema    Chronic following left knee surgery in 2000.  . Varicose veins     Past Surgical History: Past Surgical History:  Procedure Laterality Date  . EYE SURGERY    . KNEE ARTHROSCOPY  2000   Left knee.     Social History: Social History   Tobacco Use  . Smoking status: Never Smoker  . Smokeless tobacco: Never Used  Substance Use Topics  . Alcohol use: No    Alcohol/week: 0.0 standard drinks  . Drug use: No   Additional social history: lives with mom and step-dad  Please also refer  to relevant sections of EMR.  Family History: Family History  Problem Relation Age of Onset  . Heart disease Maternal Aunt   . Heart disease Maternal Uncle   . Stroke Paternal Uncle   . Cancer Paternal  Uncle        Melanoma  . Diabetes Maternal Grandmother   . Diabetes Paternal Grandfather    Allergies and Medications: No Known Allergies No current facility-administered medications on file prior to encounter.    Current Outpatient Medications on File Prior to Encounter  Medication Sig Dispense Refill  . nortriptyline (PAMELOR) 50 MG capsule Take 2 capsules (100 mg total) by mouth at bedtime. 180 capsule 2  . tizanidine (ZANAFLEX) 2 MG capsule Take 1 capsule (2 mg total) by mouth at bedtime. 30 capsule 3  . Olopatadine HCl 0.2 % SOLN Place 1 drop into both eyes every morning.  3    Objective: BP (!) 146/78   Pulse (!) 115   Temp (!) 101.5 F (38.6 C)   Resp 16   Wt 84.1 kg   SpO2 100%   BMI 27.78 kg/m  Exam: General: intubated, sedated Eyes: pupils fixed and dilated, not responsive to light ENTM: ETT in place Neck: cervical collar in place Cardiovascular: tachycardic Respiratory: ventilated Gastrointestinal: soft +BS MSK: no edema or cyanosis Derm: no lesions Neuro: sedated. Does not withdraw to painful stimuli. No corneal light reflex. Psych: sedated  Labs and Imaging: CBC BMET  Recent Labs  Lab 07/12/2018 1935 06/30/2018 1957  WBC 11.6*  --   HGB 8.0* 11.2*  HCT 25.4* 33.0*  PLT 159  --    Recent Labs  Lab 06/28/2018 1935 06/27/2018 1957  NA 142 134*  K 3.3* 4.3  CL 119* 104  CO2 15*  --   BUN 10 17  CREATININE 0.49 0.70  GLUCOSE 90 131*  CALCIUM 5.4*  --      Dg Chest Portable 1 View  Result Date: 06/27/2018 CLINICAL DATA:  Acute respiratory failure.  Post intubation. EXAM: PORTABLE CHEST 1 VIEW COMPARISON:  02/27/2014 FINDINGS: Endotracheal tube and nasogastric tube are seen in appropriate position. Heart size is normal. No evidence of pneumothorax. Atelectasis or infiltrate is seen at the left lung base which is new since previous study. Right lung remains clear. IMPRESSION: Left basilar atelectasis versus infiltrate. Electronically Signed   By: Myles Rosenthal M.D.   On: 07/17/2018 20:41   Ct Head Code Stroke Wo Contrast  Result Date: 07/13/2018 CLINICAL DATA:  Code stroke.  Altered mental status and posturing EXAM: CT HEAD WITHOUT CONTRAST TECHNIQUE: Contiguous axial images were obtained from the base of the skull through the vertex without intravenous contrast. COMPARISON:  Head CT 09/10/2014 FINDINGS: Brain: There is a massive left convexity subdural hematoma measuring up to 6.4 cm in thickness. Heterogeneity of the blood product density is consistent with hemorrhage of multiple ages. There is rightward subfalcine herniation with midline shift of approximately 1.8 cm. There is crowding of the basal cisterns. The right lateral ventricle remains markedly dilated. The left lateral ventricle is nearly effaced. Vascular: No abnormal hyperdensity of the major intracranial arteries or dural venous sinuses. No intracranial atherosclerosis. Skull: The visualized skull base, calvarium and extracranial soft tissues are normal. Sinuses/Orbits: Complete opacification of the left frontal sinus. The orbits are normal. IMPRESSION: 1. Large left convexity, mixed age subdural hematoma, measuring up to 6.4 cm with 1.8 cm of rightward midline shift and subfalcine herniation. 2. ASPECTS is not reported in the context of  acute hemorrhage Critical Value/emergent results were called by telephone at the time of interpretation on 06/24/2018 at 7:46 pm to Dr. Ritta Slot, who verbally acknowledged these results. Electronically Signed   By: Deatra Robinson M.D.   On: 06/27/2018 19:47    Tillman Sers, DO 2018-07-20, 1:00 AM PGY-3, [facility] FPTS Intern pager: [phone], text pages welcome

## 2018-07-19 NOTE — Progress Notes (Signed)
Upon further chart review patient is an internal medicine teaching service patient.  They were contacted regarding this patient's admission and as this patient is familiar to them, agreed to assume care today.   Alison HerElsia J Tamecca Artiga, DO PGY-3, [facility] 2018/05/25 7:53 AM

## 2018-07-19 NOTE — Progress Notes (Signed)
Father, Homero FellersFrank, given patients money from security, which included $40 worth of cash. Clothing still not found in ED, family aware. Dicie BeamFrazier, Marjorie Deprey RN BSN.

## 2018-07-19 NOTE — Progress Notes (Signed)
Called teaching service regarding patients increase in temperature. No new orders given at this time. Will continue to monitor. Dicie BeamFrazier, Seanpatrick Maisano RN BSN.

## 2018-07-19 NOTE — Progress Notes (Signed)
Nutrition Brief Note  RD drawn to pt due to new vent.  Chart reviewed. Pt now transitioning to comfort care with one-way extubation planned for this afternoon around 1 pm. No nutrition interventions warranted at this time.  Please consult as needed.    Earma ReadingKate Jablonski Zykeriah Mathia, MS, RD, LDN Inpatient Clinical Dietitian Pager: [phone] Weekend/After Hours: [phone]

## 2018-07-19 DEATH — deceased

## 2019-03-28 ENCOUNTER — Ambulatory Visit: Payer: Self-pay | Admitting: Neurology
